# Patient Record
Sex: Male | Born: 1945
Health system: Southern US, Community
[De-identification: ages and names within clinical notes are randomized; demographics above are authoritative.]

## PROBLEM LIST (undated history)

## (undated) DIAGNOSIS — Z87440 Personal history of urinary (tract) infections: Secondary | ICD-10-CM

## (undated) DIAGNOSIS — S72002A Fracture of unspecified part of neck of left femur, initial encounter for closed fracture: Secondary | ICD-10-CM

## (undated) DIAGNOSIS — Z8774 Personal history of (corrected) congenital malformations of heart and circulatory system: Secondary | ICD-10-CM

## (undated) DIAGNOSIS — R918 Other nonspecific abnormal finding of lung field: Secondary | ICD-10-CM

## (undated) DIAGNOSIS — I5022 Chronic systolic (congestive) heart failure: Secondary | ICD-10-CM

## (undated) DIAGNOSIS — J189 Pneumonia, unspecified organism: Secondary | ICD-10-CM

## (undated) DIAGNOSIS — E042 Nontoxic multinodular goiter: Secondary | ICD-10-CM

## (undated) DIAGNOSIS — I82409 Acute embolism and thrombosis of unspecified deep veins of unspecified lower extremity: Secondary | ICD-10-CM

## (undated) DIAGNOSIS — N183 Chronic kidney disease, stage 3 (moderate): Secondary | ICD-10-CM

## (undated) DIAGNOSIS — E785 Hyperlipidemia, unspecified: Secondary | ICD-10-CM

## (undated) DIAGNOSIS — N139 Obstructive and reflux uropathy, unspecified: Secondary | ICD-10-CM

## (undated) DIAGNOSIS — R0600 Dyspnea, unspecified: Secondary | ICD-10-CM

## (undated) DIAGNOSIS — Z9289 Personal history of other medical treatment: Secondary | ICD-10-CM

## (undated) DIAGNOSIS — C61 Malignant neoplasm of prostate: Secondary | ICD-10-CM

## (undated) DIAGNOSIS — Z862 Personal history of diseases of the blood and blood-forming organs and certain disorders involving the immune mechanism: Secondary | ICD-10-CM

## (undated) DIAGNOSIS — R0609 Other forms of dyspnea: Secondary | ICD-10-CM

## (undated) DIAGNOSIS — N179 Acute kidney failure, unspecified: Secondary | ICD-10-CM

## (undated) DIAGNOSIS — I10 Essential (primary) hypertension: Secondary | ICD-10-CM

## (undated) DIAGNOSIS — I2119 ST elevation (STEMI) myocardial infarction involving other coronary artery of inferior wall: Secondary | ICD-10-CM

## (undated) DIAGNOSIS — Z87448 Personal history of other diseases of urinary system: Secondary | ICD-10-CM

## (undated) DIAGNOSIS — S68119A Complete traumatic metacarpophalangeal amputation of unspecified finger, initial encounter: Secondary | ICD-10-CM

## (undated) DIAGNOSIS — I700 Atherosclerosis of aorta: Secondary | ICD-10-CM

## (undated) DIAGNOSIS — I2510 Atherosclerotic heart disease of native coronary artery without angina pectoris: Secondary | ICD-10-CM

## (undated) DIAGNOSIS — N281 Cyst of kidney, acquired: Secondary | ICD-10-CM

## (undated) DIAGNOSIS — Z8679 Personal history of other diseases of the circulatory system: Secondary | ICD-10-CM

## (undated) DIAGNOSIS — Z9221 Personal history of antineoplastic chemotherapy: Secondary | ICD-10-CM

## (undated) DIAGNOSIS — I509 Heart failure, unspecified: Secondary | ICD-10-CM

## (undated) DIAGNOSIS — Q210 Ventricular septal defect: Secondary | ICD-10-CM

## (undated) DIAGNOSIS — I255 Ischemic cardiomyopathy: Secondary | ICD-10-CM

## (undated) DIAGNOSIS — C259 Malignant neoplasm of pancreas, unspecified: Secondary | ICD-10-CM

## (undated) DIAGNOSIS — I252 Old myocardial infarction: Secondary | ICD-10-CM

## (undated) DIAGNOSIS — Z8639 Personal history of other endocrine, nutritional and metabolic disease: Secondary | ICD-10-CM

## (undated) HISTORY — PX: OTHER SURGICAL HISTORY: SHX169

## (undated) HISTORY — PX: UPPER GI ENDOSCOPY: SHX6162

## (undated) HISTORY — PX: CARDIAC CATHETERIZATION: SHX172

## (undated) HISTORY — DX: Essential (primary) hypertension: I10

## (undated) HISTORY — DX: Atherosclerotic heart disease of native coronary artery without angina pectoris: I25.10

## (undated) HISTORY — DX: ST elevation (STEMI) myocardial infarction involving other coronary artery of inferior wall: I21.19

## (undated) HISTORY — PX: CORONARY ARTERY BYPASS GRAFT: SHX141

---

## 1964-01-14 DIAGNOSIS — I2119 ST elevation (STEMI) myocardial infarction involving other coronary artery of inferior wall: Secondary | ICD-10-CM

## 1964-01-14 HISTORY — DX: ST elevation (STEMI) myocardial infarction involving other coronary artery of inferior wall: I21.19

## 1997-07-29 ENCOUNTER — Emergency Department (HOSPITAL_COMMUNITY): Admission: EM | Admit: 1997-07-29 | Discharge: 1997-07-29 | Payer: Self-pay

## 1998-09-24 ENCOUNTER — Encounter: Payer: Self-pay | Admitting: Internal Medicine

## 1998-09-24 ENCOUNTER — Emergency Department (HOSPITAL_COMMUNITY): Admission: EM | Admit: 1998-09-24 | Discharge: 1998-09-24 | Payer: Self-pay | Admitting: Internal Medicine

## 1998-09-27 ENCOUNTER — Emergency Department (HOSPITAL_COMMUNITY): Admission: EM | Admit: 1998-09-27 | Discharge: 1998-09-28 | Payer: Self-pay | Admitting: Emergency Medicine

## 1998-09-27 ENCOUNTER — Encounter: Payer: Self-pay | Admitting: Emergency Medicine

## 1998-10-19 ENCOUNTER — Emergency Department (HOSPITAL_COMMUNITY): Admission: EM | Admit: 1998-10-19 | Discharge: 1998-10-20 | Payer: Self-pay | Admitting: Emergency Medicine

## 1998-10-19 ENCOUNTER — Encounter: Payer: Self-pay | Admitting: Emergency Medicine

## 1999-02-13 ENCOUNTER — Emergency Department (HOSPITAL_COMMUNITY): Admission: EM | Admit: 1999-02-13 | Discharge: 1999-02-13 | Payer: Self-pay | Admitting: Emergency Medicine

## 1999-07-08 ENCOUNTER — Emergency Department (HOSPITAL_COMMUNITY): Admission: EM | Admit: 1999-07-08 | Discharge: 1999-07-08 | Payer: Self-pay | Admitting: Emergency Medicine

## 1999-07-08 ENCOUNTER — Encounter: Payer: Self-pay | Admitting: Emergency Medicine

## 2000-02-25 ENCOUNTER — Emergency Department (HOSPITAL_COMMUNITY): Admission: EM | Admit: 2000-02-25 | Discharge: 2000-02-25 | Payer: Self-pay | Admitting: Internal Medicine

## 2000-08-16 ENCOUNTER — Emergency Department (HOSPITAL_COMMUNITY): Admission: EM | Admit: 2000-08-16 | Discharge: 2000-08-16 | Payer: Self-pay

## 2000-08-18 ENCOUNTER — Emergency Department (HOSPITAL_COMMUNITY): Admission: EM | Admit: 2000-08-18 | Discharge: 2000-08-18 | Payer: Self-pay | Admitting: Emergency Medicine

## 2000-08-18 ENCOUNTER — Encounter: Payer: Self-pay | Admitting: Emergency Medicine

## 2001-05-29 ENCOUNTER — Emergency Department (HOSPITAL_COMMUNITY): Admission: EM | Admit: 2001-05-29 | Discharge: 2001-05-29 | Payer: Self-pay

## 2001-10-08 ENCOUNTER — Emergency Department (HOSPITAL_COMMUNITY): Admission: EM | Admit: 2001-10-08 | Discharge: 2001-10-08 | Payer: Self-pay | Admitting: Emergency Medicine

## 2004-01-09 ENCOUNTER — Ambulatory Visit: Payer: Self-pay | Admitting: Nurse Practitioner

## 2004-06-02 ENCOUNTER — Emergency Department (HOSPITAL_COMMUNITY): Admission: EM | Admit: 2004-06-02 | Discharge: 2004-06-02 | Payer: Self-pay | Admitting: Emergency Medicine

## 2004-06-04 ENCOUNTER — Ambulatory Visit: Payer: Self-pay | Admitting: Nurse Practitioner

## 2004-06-11 ENCOUNTER — Ambulatory Visit: Payer: Self-pay | Admitting: Nurse Practitioner

## 2005-02-03 ENCOUNTER — Ambulatory Visit: Payer: Self-pay | Admitting: Internal Medicine

## 2005-12-02 ENCOUNTER — Ambulatory Visit: Payer: Self-pay | Admitting: Nurse Practitioner

## 2006-07-28 ENCOUNTER — Ambulatory Visit: Payer: Self-pay | Admitting: Family Medicine

## 2006-08-31 ENCOUNTER — Ambulatory Visit: Payer: Self-pay | Admitting: Internal Medicine

## 2006-11-23 ENCOUNTER — Ambulatory Visit: Payer: Self-pay | Admitting: Internal Medicine

## 2006-11-23 ENCOUNTER — Encounter (INDEPENDENT_AMBULATORY_CARE_PROVIDER_SITE_OTHER): Payer: Self-pay | Admitting: Nurse Practitioner

## 2006-11-23 LAB — CONVERTED CEMR LAB
ALT: <8 units/L (ref 0–53)
AST: 11 units/L (ref 0–37)
Basophils Absolute: 0 10*3/uL (ref 0.0–0.1)
Basophils Relative: 1 % (ref 0–1)
Chloride: 104 meq/L (ref 96–112)
Creatinine, Ser: 1.03 mg/dL (ref 0.40–1.50)
Eosinophils Relative: 1 % (ref 0–5)
Hemoglobin: 12.3 g/dL — ABNORMAL LOW (ref 13.0–17.0)
MCHC: 31.7 g/dL (ref 30.0–36.0)
Monocytes Absolute: 0.3 10*3/uL (ref 0.1–1.0)
Neutro Abs: 2.9 10*3/uL (ref 1.7–7.7)
PSA: 0.72 ng/mL (ref 0.10–4.00)
Platelets: 418 10*3/uL — ABNORMAL HIGH (ref 150–400)
RDW: 16.2 % — ABNORMAL HIGH (ref 11.5–15.5)
Sodium: 140 meq/L (ref 135–145)
TSH: 0.953 microintl units/mL (ref 0.350–5.50)
Total Bilirubin: 0.2 mg/dL — ABNORMAL LOW (ref 0.3–1.2)
Total CHOL/HDL Ratio: 6
VLDL: 25 mg/dL (ref 0–40)

## 2006-12-06 IMAGING — CR DG CHEST 2V
2 series · 2 of 2 positions shown · non-contrast
Comparison: None.

CLINICAL DATA: Productive cough.  High blood pressure.  Smoker.
CHEST - 2 VIEWS:

[w chest pa]
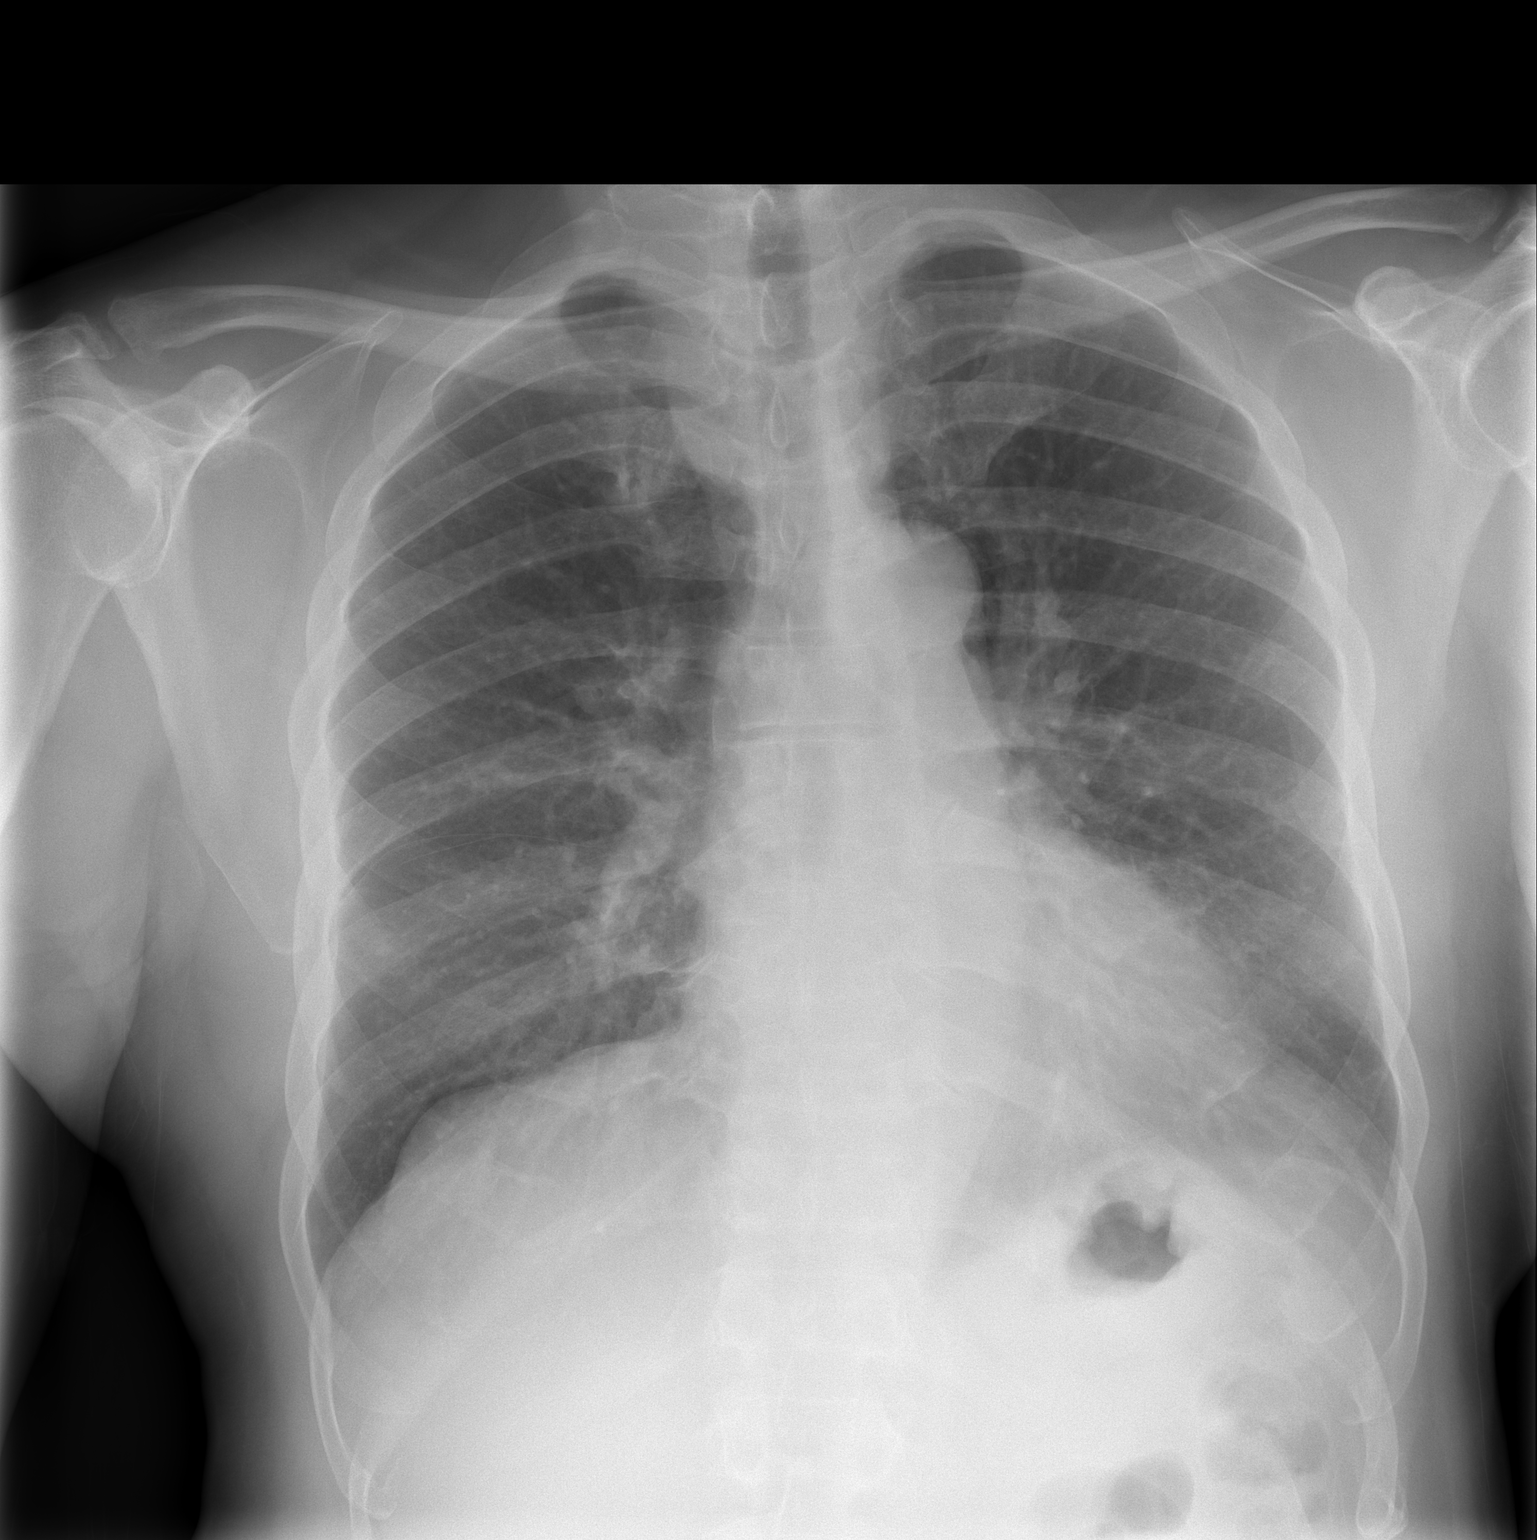

[w chest lat]
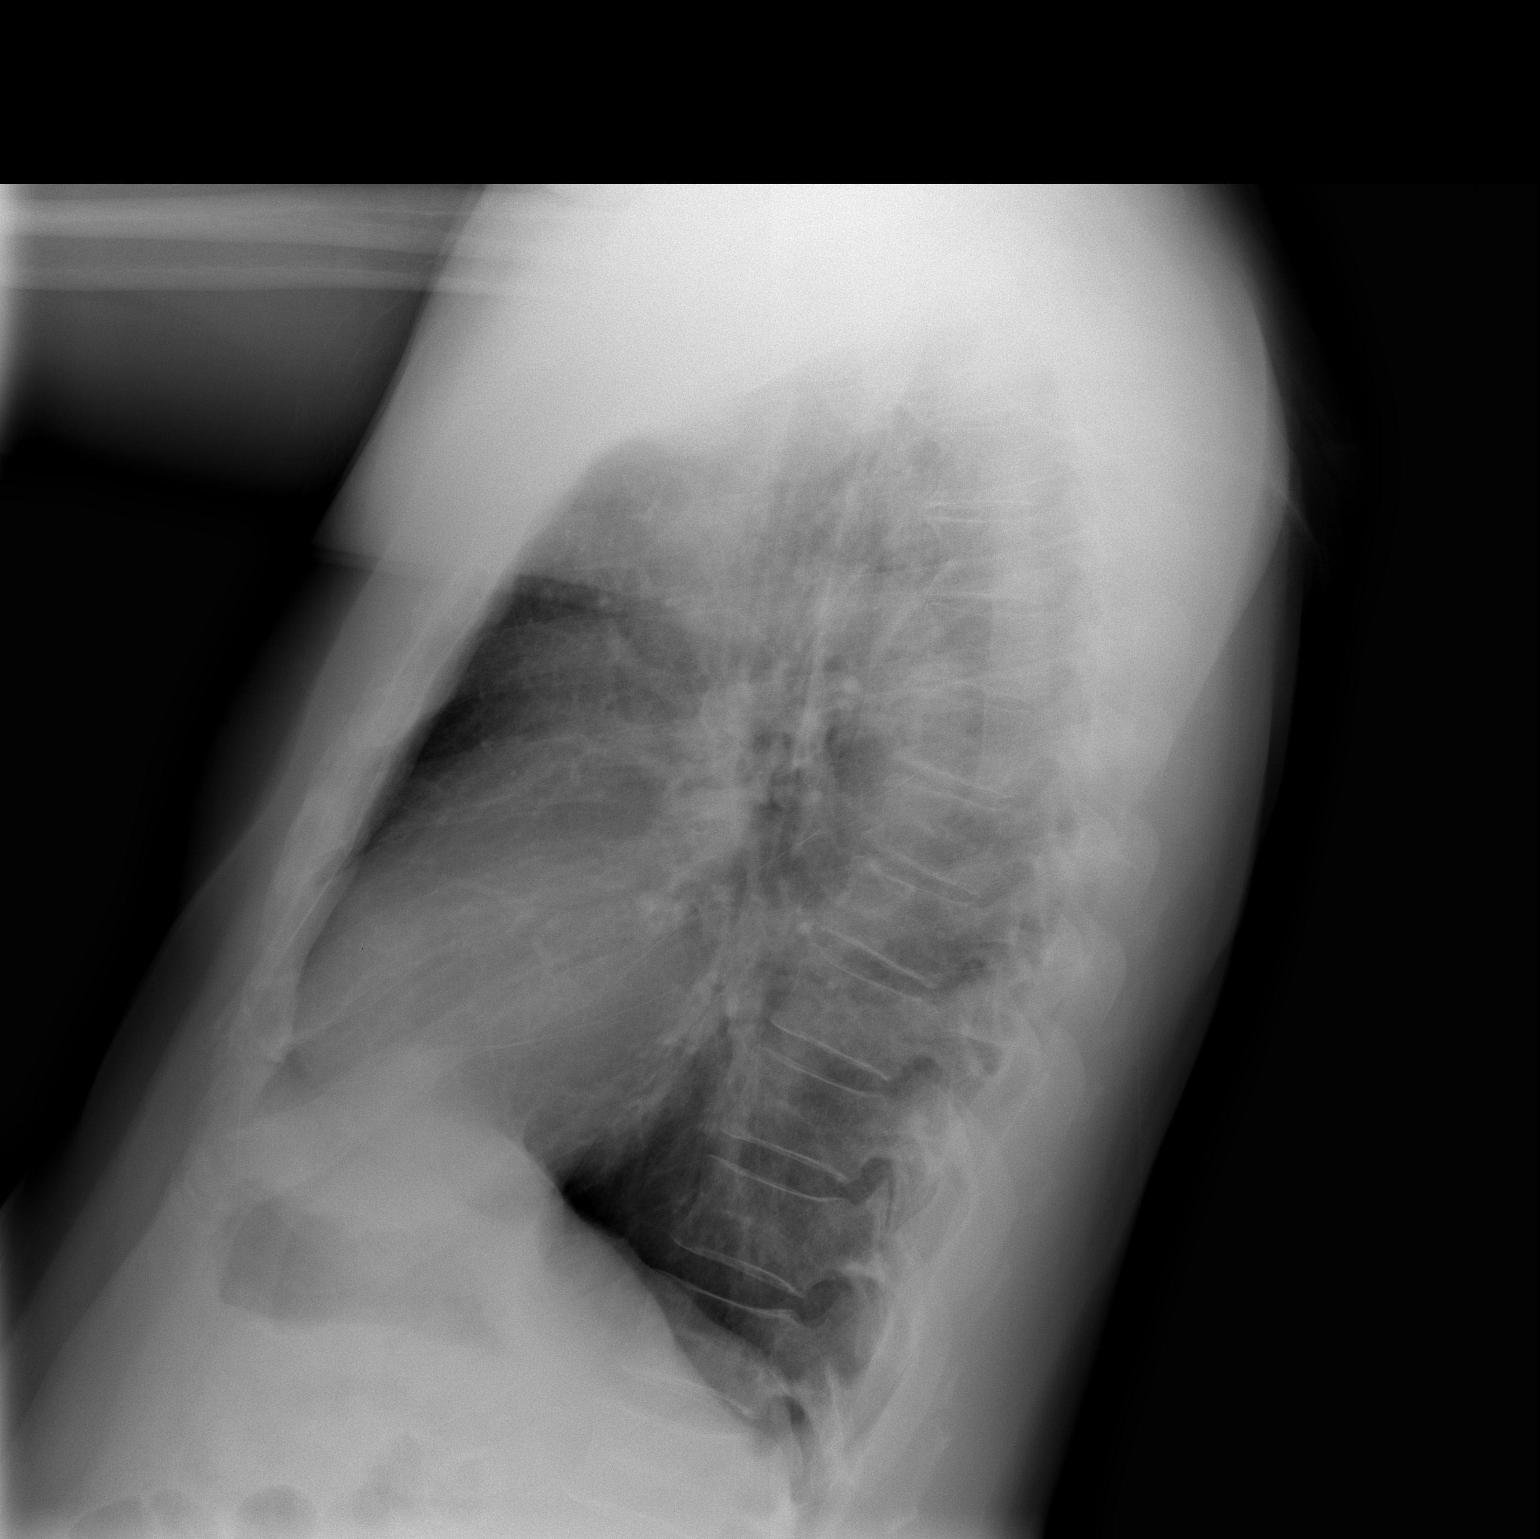

[2 of 2 positions shown; findings below may reference images not displayed]

FINDINGS: A 5 mm nodule within the right midlung is identified.  Golzad the patient?s history of smoking, I would recommend a three month follow-up.  
The remaining lungs are clear.  The heart size is normal.  No pleural effusions or pneumothorax.
IMPRESSION: 1.  No acute cardiopulmonary abnormality.
2.  5 mm nodule with the right midlung.  Recommend three month follow-up.

## 2007-11-23 ENCOUNTER — Emergency Department (HOSPITAL_COMMUNITY): Admission: EM | Admit: 2007-11-23 | Discharge: 2007-11-23 | Payer: Self-pay | Admitting: Family Medicine

## 2008-01-20 ENCOUNTER — Ambulatory Visit: Payer: Self-pay | Admitting: Internal Medicine

## 2008-01-21 ENCOUNTER — Encounter (INDEPENDENT_AMBULATORY_CARE_PROVIDER_SITE_OTHER): Payer: Self-pay | Admitting: Internal Medicine

## 2008-01-21 LAB — CONVERTED CEMR LAB
ALT: <8 units/L (ref 0–53)
AST: 10 units/L (ref 0–37)
Albumin: 3.4 g/dL — ABNORMAL LOW (ref 3.5–5.2)
Basophils Absolute: 0 10*3/uL (ref 0.0–0.1)
Basophils Relative: 1 % (ref 0–1)
Calcium: 8.8 mg/dL (ref 8.4–10.5)
Chloride: 106 meq/L (ref 96–112)
Creatinine, Ser: 1 mg/dL (ref 0.40–1.50)
MCHC: 32.2 g/dL (ref 30.0–36.0)
Neutro Abs: 2.5 10*3/uL (ref 1.7–7.7)
Neutrophils Relative %: 43 % (ref 43–77)
PSA: 0.73 ng/mL (ref 0.10–4.00)
Platelets: 371 10*3/uL (ref 150–400)
Potassium: 3.5 meq/L (ref 3.5–5.3)
RDW: 16.4 % — ABNORMAL HIGH (ref 11.5–15.5)
Total CHOL/HDL Ratio: 5.9

## 2008-08-22 ENCOUNTER — Ambulatory Visit: Payer: Self-pay | Admitting: Family Medicine

## 2008-08-22 ENCOUNTER — Encounter (INDEPENDENT_AMBULATORY_CARE_PROVIDER_SITE_OTHER): Payer: Self-pay | Admitting: Internal Medicine

## 2008-08-22 LAB — CONVERTED CEMR LAB: Microalb, Ur: 28.93 mg/dL — ABNORMAL HIGH (ref 0.00–1.89)

## 2009-09-13 DIAGNOSIS — Q210 Ventricular septal defect: Secondary | ICD-10-CM

## 2009-09-13 HISTORY — DX: Ventricular septal defect: Q21.0

## 2009-09-13 HISTORY — PX: OTHER SURGICAL HISTORY: SHX169

## 2009-10-08 ENCOUNTER — Inpatient Hospital Stay (HOSPITAL_COMMUNITY)
Admission: EM | Admit: 2009-10-08 | Discharge: 2009-10-16 | Payer: Self-pay | Source: Home / Self Care | Admitting: Cardiology

## 2009-10-08 ENCOUNTER — Encounter: Payer: Self-pay | Admitting: Emergency Medicine

## 2009-10-08 ENCOUNTER — Ambulatory Visit: Payer: Self-pay | Admitting: Cardiology

## 2009-10-08 ENCOUNTER — Ambulatory Visit: Payer: Self-pay | Admitting: Cardiothoracic Surgery

## 2009-10-08 DIAGNOSIS — I252 Old myocardial infarction: Secondary | ICD-10-CM

## 2009-10-08 HISTORY — DX: Old myocardial infarction: I25.2

## 2009-10-09 ENCOUNTER — Encounter: Payer: Self-pay | Admitting: Cardiology

## 2009-10-17 ENCOUNTER — Encounter: Payer: Self-pay | Admitting: Cardiology

## 2009-10-18 ENCOUNTER — Encounter: Payer: Self-pay | Admitting: Internal Medicine

## 2009-11-05 ENCOUNTER — Encounter: Admission: RE | Admit: 2009-11-05 | Discharge: 2009-11-05 | Payer: Self-pay | Admitting: Cardiothoracic Surgery

## 2009-11-05 ENCOUNTER — Ambulatory Visit: Payer: Self-pay | Admitting: Cardiothoracic Surgery

## 2009-11-07 ENCOUNTER — Ambulatory Visit: Payer: Self-pay | Admitting: Internal Medicine

## 2009-11-07 ENCOUNTER — Encounter: Payer: Self-pay | Admitting: Internal Medicine

## 2009-11-07 ENCOUNTER — Encounter: Payer: Self-pay | Admitting: Physician Assistant

## 2009-11-07 DIAGNOSIS — I10 Essential (primary) hypertension: Secondary | ICD-10-CM

## 2009-11-07 DIAGNOSIS — Q210 Ventricular septal defect: Secondary | ICD-10-CM | POA: Insufficient documentation

## 2009-11-07 DIAGNOSIS — R011 Cardiac murmur, unspecified: Secondary | ICD-10-CM

## 2009-11-07 DIAGNOSIS — I25810 Atherosclerosis of coronary artery bypass graft(s) without angina pectoris: Secondary | ICD-10-CM

## 2009-11-09 ENCOUNTER — Encounter: Payer: Self-pay | Admitting: Internal Medicine

## 2009-11-14 ENCOUNTER — Ambulatory Visit: Payer: Self-pay | Admitting: Cardiothoracic Surgery

## 2009-11-14 ENCOUNTER — Encounter: Payer: Self-pay | Admitting: Cardiology

## 2009-11-14 ENCOUNTER — Encounter: Admission: RE | Admit: 2009-11-14 | Discharge: 2009-11-14 | Payer: Self-pay | Admitting: Cardiothoracic Surgery

## 2009-11-16 ENCOUNTER — Encounter: Payer: Self-pay | Admitting: Internal Medicine

## 2009-11-23 ENCOUNTER — Encounter: Payer: Self-pay | Admitting: Internal Medicine

## 2009-12-05 ENCOUNTER — Ambulatory Visit: Payer: Self-pay | Admitting: Cardiothoracic Surgery

## 2009-12-05 ENCOUNTER — Encounter: Admission: RE | Admit: 2009-12-05 | Discharge: 2009-12-05 | Payer: Self-pay | Admitting: Cardiothoracic Surgery

## 2009-12-05 ENCOUNTER — Encounter: Payer: Self-pay | Admitting: Cardiology

## 2009-12-11 ENCOUNTER — Ambulatory Visit: Payer: Self-pay | Admitting: Cardiology

## 2009-12-11 ENCOUNTER — Ambulatory Visit (HOSPITAL_COMMUNITY): Admission: RE | Admit: 2009-12-11 | Discharge: 2009-12-11 | Payer: Self-pay | Admitting: Internal Medicine

## 2009-12-11 ENCOUNTER — Ambulatory Visit: Payer: Self-pay | Admitting: Internal Medicine

## 2009-12-11 ENCOUNTER — Encounter: Payer: Self-pay | Admitting: Internal Medicine

## 2009-12-11 ENCOUNTER — Ambulatory Visit: Payer: Self-pay

## 2009-12-11 DIAGNOSIS — I5022 Chronic systolic (congestive) heart failure: Secondary | ICD-10-CM

## 2009-12-20 LAB — CONVERTED CEMR LAB
Basophils Relative: 0.3 % (ref 0.0–3.0)
Calcium: 9.4 mg/dL (ref 8.4–10.5)
Chloride: 105 meq/L (ref 96–112)
Creatinine, Ser: 1.2 mg/dL (ref 0.4–1.5)
Eosinophils Relative: 2.7 % (ref 0.0–5.0)
GFR calc non Af Amer: 76.99 mL/min (ref >60)
Lymphocytes Relative: 34.8 % (ref 12.0–46.0)
MCV: 83.7 fL (ref 78.0–100.0)
Monocytes Relative: 8.3 % (ref 3.0–12.0)
Neutrophils Relative %: 53.9 % (ref 43.0–77.0)
RBC: 3.81 M/uL — ABNORMAL LOW (ref 4.22–5.81)
WBC: 8.3 10*3/uL (ref 4.5–10.5)

## 2009-12-26 ENCOUNTER — Emergency Department (HOSPITAL_COMMUNITY)
Admission: EM | Admit: 2009-12-26 | Discharge: 2009-12-26 | Payer: Self-pay | Source: Home / Self Care | Admitting: Emergency Medicine

## 2010-02-12 ENCOUNTER — Telehealth: Payer: Self-pay | Admitting: Internal Medicine

## 2010-02-12 NOTE — Assessment & Plan Note (Signed)
Summary: eph/ gd   Visit Type:  Follow-up  CC:  no complaints.  History of Present Illness: This is a 65 year old African American male patient who had emergency CABG x4 with LIMA to the LAD, SVG to the diagonal, SVG to the circumflex marginal, and SVG to the PDA as well as repair of posterior infarction and posterior ventricular septal defect in September 2011. He also had urethral stricture with with difficulty inserting the Foley catheter at time of surgery.  The patient has been staying at [facility] since surgery but is looking for a place to go once he is discharged. He does have some edema but denies any dyspnea, dyspnea on exertion, palpitations, chest pain, dizziness, or presyncope.  The patient saw the PA at the cardiovascular surgical office on Monday and was placed on Lasix because of a left pleural effusion and edema.  Current Medications (verified): 1)  Amlodipine Besylate 10 Mg Tabs (Amlodipine Besylate) .... Take One Daily 2)  Bufferin 325 Mg Tabs (Aspirin Buf(Cacarb-Mgcarb-Mgo)) .... Take One Daily 3)  Coreg 12.5 Mg Tabs (Carvedilol) .... Take One Two Times A Day 4)  Plavix 75 Mg Tabs (Clopidogrel Bisulfate) .... Take One Daily 5)  Crestor 40 Mg Tabs (Rosuvastatin Calcium) .... Take One Daily 6)  Ultram 50 Mg Tabs (Tramadol Hcl) .... Take As Needed 7)  Furosemide 40 Mg Tabs (Furosemide) .... One Daily For 7 Days 8)  Klor-Con M20 20 Meq Cr-Tabs (Potassium Chloride Crys Cr) .... One Daily For 7 Days  Past History:  Past Medical History: Last updated: 11/06/2009 Acute inferior wall myocardial infarction Severe 3-vessel coronary artery disease hypertension History of tobacco abuse Urethral stricture disease  Social History: Reviewed history from 11/06/2009 and no changes required. The patient lives in [place] alone.   He is not working.   He has a 50+ pack-year smoking history quitting 2 weeks ago No EtOH or illicit drug use.  No  herbal meds Regular diet No regular exercise  Review of Systems       see history of present illness  Vital Signs:  Patient profile:   65 year old male Height:      66 inches Weight:      177 pounds BMI:     28.67 Pulse rate:   68 / minute Pulse rhythm:   regular BP sitting:   124 / 79  (right arm)  Vitals Entered By: Talbert Nan, CMA (November 07, 2009 11:10 AM)  Physical Exam  General:   Well-nournished, in no acute distress. Neck:slight increased JVD, No , HJR, Bruit, or thyroid enlargement Lungs: decreased breath sounds on the left lung base and half the way up,No tachypnea,right lung clear without wheezing, rales, or rhonchi Cardiovascular: RRR, PMI not displaced, heart sounds normal,a loud [id number] systolic murmur at the apex, no gallops, bruit, thrill, or heave. Abdomen: BS normal. Soft without organomegaly, masses, lesions or tenderness. Extremities:+2-3 bilateral leg edema his knees, without cyanosis, clubbing . Good distal pulses bilateral SKin: Warm, no lesions or rashes  Musculoskeletal: No deformities Neuro: no focal signs    EKG  Procedure date:  11/07/2009  Findings:      normal sinus rhythm inferior Q waves T wave inversion inferolaterally poor R-wave progression  Impression & Recommendations:  Problem # 1:  CAD, AUTOLOGOUS BYPASS GRAFT (ICD-414.02)  His updated medication list for this problem includes:    Amlodipine Besylate 10 Mg Tabs (Amlodipine besylate) .Marland Kitchen... Take one daily    Bufferin 325 Mg Tabs (  Aspirin buf(cacarb-mgcarb-mgo)) .Marland Kitchen... Take one daily    Coreg 12.5 Mg Tabs (Carvedilol) .Marland Kitchen... Take one two times a day    Plavix 75 Mg Tabs (Clopidogrel bisulfate) .Marland Kitchen... Take one daily  His updated medication list for this problem includes:    Amlodipine Besylate 10 Mg Tabs (Amlodipine besylate) .Marland Kitchen... Take one daily    Bufferin 325 Mg Tabs (Aspirin buf(cacarb-mgcarb-mgo)) .Marland Kitchen... Take one daily    Coreg 12.5 Mg Tabs (Carvedilol) .Marland Kitchen... Take one two  times a day    Plavix 75 Mg Tabs (Clopidogrel bisulfate) .Marland Kitchen... Take one daily  Problem # 2:  VENTRICULAR SEPTAL DEFECT (ICD-745.4)  Orders: Echocardiogram (Echo)  Problem # 3:  PLEURAL EFFUSION, LEFT (ICD-511.9) Patient has a left pleural effusion and lower extremity edema. He was given a 7 day treatment of diuretics. He is to follow up with the surgeons for this.  Problem # 4:  RENAL INSUFFICIENCY (ICD-588.9) Patient had some renal insufficiency in the hospital and therefore was started on an ACE inhibitor. He has had normal BUN and creatinine since he's been home. I will now start an ACE inhibitor now that he is on a diuretic. We will follow him up and reevaluate the need for an ACE inhibitor.  Patient Instructions: 1)  Your physician has requested that you have an echocardiogram.  Echocardiography is a painless test that uses sound waves to create images of your heart. It provides your doctor with information about the size and shape of your heart and how well your heart's chambers and valves are working.  This procedure takes approximately one hour. There are no restrictions for this procedure. 2)  Your physician recommends that you schedule a follow-up appointment in: 1 month with Dr Haroldine Laws.

## 2010-02-12 NOTE — Letter (Signed)
Summary: Cardiac Rehab Phase 2   Cardiac Rehab Phase 2   Imported By: Marilynne Drivers 11/01/2009 13:13:29  _____________________________________________________________________  External Attachment:    Type:   Image     Comment:   External Document

## 2010-02-12 NOTE — Assessment & Plan Note (Signed)
Summary: 1 month rov/sl   Visit Type:  Follow-up Primary Provider:  Shary Key, MD   History of Present Illness: [name] is a 65 year old male who experienced an acute inferior MI in September 2011 due to occlusion of a PL branch. Cath with severe 3-V CAD with TIMI-3 flow in the PL branch. Post MI course c/b acute VSD ao taken emergency CABG x4 with LIMA to the LAD, SVG to the diagonal, SVG to the circumflex marginal, and SVG to the PDA as well as repair of entricular septal defect in September 2011.  Discharged to [facility] rehab facility. Post-op had persistent L pleural effusion which resolved with Lasix. Saw Dr. Prescott Gum last week and was doing well.   Doing very well. Working with rehab and ambulating. No CP or SOB. Edema resolved.   Echo today which I reviewed personally showed  EF  ~35-40% range with akinesis of mid to distal inferior wall, apex and septum. VSD patch stable. Mild MR.    Current Medications (verified): 1)  Amlodipine Besylate 10 Mg Tabs (Amlodipine Besylate) .... Take One Daily 2)  Bufferin 325 Mg Tabs (Aspirin Buf(Cacarb-Mgcarb-Mgo)) .... Take One Daily 3)  Coreg 12.5 Mg Tabs (Carvedilol) .... Take One Two Times A Day 4)  Plavix 75 Mg Tabs (Clopidogrel Bisulfate) .... Take One Daily 5)  Crestor 40 Mg Tabs (Rosuvastatin Calcium) .... Take One Daily 6)  Ultram 50 Mg Tabs (Tramadol Hcl) .... Take As Needed  Allergies (verified): No Known Drug Allergies  Past History:  Past Medical History: Last updated: 11/06/2009 Acute inferior wall myocardial infarction Severe 3-vessel coronary artery disease hypertension History of tobacco abuse Urethral stricture disease  Review of Systems       As per HPI and past medical history; otherwise all systems negative.   Vital Signs:  Patient profile:   65 year old male Height:      66 inches Weight:      180 pounds BMI:     29.16 Pulse rate:   72 / minute BP sitting:   110 / 88  (left arm)  Vitals Entered By:  Margaretmary Bayley CMA (December 11, 2009 11:20 AM)  Physical Exam  General:  Sitting in [place]-C.  Well-nournished, in no acute distress. Neck: supple no JVD. carotids 2+ bilaterall Lungs: clear without wheezing, rales, or rhonchi Cardiovascular: RRR, PMI not displaced, heart sounds normal 2/6 systolic murmur at the apex, no gallops, bruit, thrill, or heave. Abdomen: BS normal. Soft without organomegaly, masses, lesions or tenderness. Extremities:tr edema without cyanosis, clubbing . Good distal pulses bilateral SKin: Warm, no lesions or rashes  Musculoskeletal: No deformities Neuro: no focal signs    Impression & Recommendations:  Problem # 1:  CAD, AUTOLOGOUS BYPASS GRAFT (ICD-414.02) Stable. Doing well post-op.    Problem # 2:  SYSTOLIC HEART FAILURE, CHRONIC (ICD-428.22) Functional status much improved. Volume overload resolved on low-dose lasix. Will check labs today. Continue b-blocker. Start lisinopril 5mg  daily and titrate as tolerated.   Other Orders: TLB-BMP (Basic Metabolic Panel-BMET) (99991111) TLB-CBC Platelet - w/Differential (85025-CBCD) TLB-BNP (B-Natriuretic Peptide) (83880-BNPR)   Patient Instructions: 1)  Your physician recommends that you schedule a follow-up appointment in: 6 months with Dr. Haroldine Laws 2)  Your physician recommends that you have  lab work today:bmet,bnp,cbc 3)  Your physician has recommended you make the following change in your medication:  Prescriptions: LISINOPRIL 5 MG TABS (LISINOPRIL) Take one tablet by mouth daily  #30 x 6   Entered by:   Joelyn Oms RN  Authorized by:   Jolaine Artist, MD, [facility]   Signed by:   Joelyn Oms RN on 12/11/2009   Method used:   Print then Give to Patient   RxID:   [id number]

## 2010-02-12 NOTE — Consult Note (Signed)
Summary: [facility] By: Sallee Provencal 11/19/2009 09:16:33  _____________________________________________________________________  External Attachment:    Type:   Image     Comment:   External Document

## 2010-02-12 NOTE — Letter (Signed)
Summary: [facility]   Imported By: Marilynne Drivers 12/18/2009 15:10:40  _____________________________________________________________________  External Attachment:    Type:   Image     Comment:   External Document

## 2010-02-14 NOTE — Letter (Signed)
Summary: [facility] Office Visit Note   [facility] Visit Note   Imported By: Sallee Provencal 01/11/2010 11:51:03  _____________________________________________________________________  External Attachment:    Type:   Image     Comment:   External Document

## 2010-02-14 NOTE — Letter (Signed)
Summary: [facility] - Office Note  [facility] - Office Note   Imported By: Marilynne Drivers 01/03/2010 16:39:24  _____________________________________________________________________  External Attachment:    Type:   Image     Comment:   External Document

## 2010-02-20 NOTE — Progress Notes (Signed)
Summary: refill  Phone Note Refill Request Message from:  Patient on February 12, 2010 2:34 PM  Refills Requested: Medication #1:  PLAVIX 75 MG TABS take one daily  Medication #2:  CRESTOR 40 MG TABS take one daily  Medication #3:  LISINOPRIL 5 MG TABS Take one tablet by mouth daily.  Medication #4:  AMLODIPINE BESYLATE 10 MG TABS take one daily send to [facility] [phone]  Initial call taken by: Delsa Sale,  February 12, 2010 2:35 PM    Prescriptions: LISINOPRIL 5 MG TABS (LISINOPRIL) Take one tablet by mouth daily  #30 x 6   Entered by:   Mignon Pine, RMA   Authorized by:   Jolaine Artist, MD, [facility]   Signed by:   Mignon Pine, RMA on 02/12/2010   Method used:   Electronically to        [facility] (retail)       [place], [place]  [address]       Ph: [phone]       Fax: [phone]   RxID:   [id number] CRESTOR 40 MG TABS (ROSUVASTATIN CALCIUM) take one daily  #30 x 6   Entered by:   Mignon Pine, RMA   Authorized by:   Jolaine Artist, MD, [facility]   Signed by:   Mignon Pine, RMA on 02/12/2010   Method used:   Electronically to        [facility] [phone]* (retail)       [place], [place]  [address]       Ph: [phone]       Fax: [phone]   RxID:   [id number] PLAVIX 75 MG TABS (CLOPIDOGREL BISULFATE) take one daily  #30 x 6   Entered by:   Mignon Pine, RMA   Authorized by:   Jolaine Artist, MD, [facility]   Signed by:   Mignon Pine, RMA on 02/12/2010   Method used:   Electronically to        [facility] (retail)       [place], [place]  [address]       Ph: [phone]       Fax: [phone]   RxID:   [id number] AMLODIPINE BESYLATE 10 MG TABS (AMLODIPINE BESYLATE) take one daily  #30 x 6   Entered by:   Mignon Pine, RMA   Authorized by:   Jolaine Artist, MD, [facility]   Signed by:   Mignon Pine, RMA on 02/12/2010  Method used:   Electronically to        [facility] (retail)       [address]       [place],   [address]       Ph: [phone]       Fax: [phone]   RxID:   [id number]

## 2010-03-25 LAB — DIFFERENTIAL
Eosinophils Absolute: 0.3 10*3/uL (ref 0.0–0.7)
Eosinophils Relative: 4 % (ref 0–5)
Lymphocytes Relative: 37 % (ref 12–46)
Lymphs Abs: 3 10*3/uL (ref 0.7–4.0)
Monocytes Relative: 8 % (ref 3–12)

## 2010-03-25 LAB — BASIC METABOLIC PANEL
CO2: 26 mEq/L (ref 19–32)
Chloride: 109 mEq/L (ref 96–112)
GFR calc Af Amer: >60 mL/min (ref >60)
Potassium: 4.2 mEq/L (ref 3.5–5.1)
Sodium: 139 mEq/L (ref 135–145)

## 2010-03-25 LAB — CBC
Hemoglobin: 10.3 g/dL — ABNORMAL LOW (ref 13.0–17.0)
MCH: 25.9 pg — ABNORMAL LOW (ref 26.0–34.0)
MCV: 82.4 fL (ref 78.0–100.0)
RBC: 3.98 MIL/uL — ABNORMAL LOW (ref 4.22–5.81)
WBC: 8.3 10*3/uL (ref 4.0–10.5)

## 2010-03-27 LAB — BASIC METABOLIC PANEL
BUN: 27 mg/dL — ABNORMAL HIGH (ref 6–23)
BUN: 28 mg/dL — ABNORMAL HIGH (ref 6–23)
CO2: 23 mEq/L (ref 19–32)
CO2: 28 mEq/L (ref 19–32)
Calcium: 8.4 mg/dL (ref 8.4–10.5)
Chloride: 102 mEq/L (ref 96–112)
Chloride: 97 mEq/L (ref 96–112)
Creatinine, Ser: 1.44 mg/dL (ref 0.4–1.5)
Creatinine, Ser: 1.5 mg/dL (ref 0.4–1.5)
GFR calc Af Amer: 59 mL/min — ABNORMAL LOW (ref >60)
GFR calc non Af Amer: 49 mL/min — ABNORMAL LOW (ref >60)
Glucose, Bld: 91 mg/dL (ref 70–99)
Glucose, Bld: 91 mg/dL (ref 70–99)
Potassium: 3.7 mEq/L (ref 3.5–5.1)
Potassium: 4.2 mEq/L (ref 3.5–5.1)
Sodium: 134 mEq/L — ABNORMAL LOW (ref 135–145)

## 2010-03-27 LAB — COMPREHENSIVE METABOLIC PANEL
ALT: 21 U/L (ref 0–53)
AST: 27 U/L (ref 0–37)
Albumin: 2.4 g/dL — ABNORMAL LOW (ref 3.5–5.2)
Alkaline Phosphatase: 114 U/L (ref 39–117)
BUN: 29 mg/dL — ABNORMAL HIGH (ref 6–23)
CO2: 27 mEq/L (ref 19–32)
Calcium: 8.3 mg/dL — ABNORMAL LOW (ref 8.4–10.5)
Chloride: 101 mEq/L (ref 96–112)
Creatinine, Ser: 1.68 mg/dL — ABNORMAL HIGH (ref 0.4–1.5)
GFR calc Af Amer: 50 mL/min — ABNORMAL LOW (ref >60)
GFR calc non Af Amer: 41 mL/min — ABNORMAL LOW (ref >60)
Glucose, Bld: 94 mg/dL (ref 70–99)
Potassium: 3.3 mEq/L — ABNORMAL LOW (ref 3.5–5.1)
Sodium: 135 mEq/L (ref 135–145)
Total Bilirubin: 0.8 mg/dL (ref 0.3–1.2)
Total Protein: 6 g/dL (ref 6.0–8.3)

## 2010-03-27 LAB — CBC
HCT: 27.8 % — ABNORMAL LOW (ref 39.0–52.0)
Hemoglobin: 9.3 g/dL — ABNORMAL LOW (ref 13.0–17.0)
MCH: 29 pg (ref 26.0–34.0)
MCHC: 33.5 g/dL (ref 30.0–36.0)
MCV: 86.6 fL (ref 78.0–100.0)
Platelets: 180 10*3/uL (ref 150–400)
RBC: 3.21 MIL/uL — ABNORMAL LOW (ref 4.22–5.81)
RDW: 16.4 % — ABNORMAL HIGH (ref 11.5–15.5)
WBC: 13.6 10*3/uL — ABNORMAL HIGH (ref 4.0–10.5)

## 2010-03-27 LAB — GLUCOSE, CAPILLARY

## 2010-03-28 LAB — URINE MICROSCOPIC-ADD ON

## 2010-03-28 LAB — BASIC METABOLIC PANEL
BUN: 29 mg/dL — ABNORMAL HIGH (ref 6–23)
BUN: 48 mg/dL — ABNORMAL HIGH (ref 6–23)
CO2: 23 mEq/L (ref 19–32)
CO2: 21 mEq/L (ref 19–32)
Calcium: 8.1 mg/dL — ABNORMAL LOW (ref 8.4–10.5)
Calcium: 8.8 mg/dL (ref 8.4–10.5)
Calcium: 9 mg/dL (ref 8.4–10.5)
Chloride: 111 mEq/L (ref 96–112)
Chloride: 111 mEq/L (ref 96–112)
Chloride: 104 mEq/L (ref 96–112)
Creatinine, Ser: 1.57 mg/dL — ABNORMAL HIGH (ref 0.4–1.5)
GFR calc Af Amer: 54 mL/min — ABNORMAL LOW (ref >60)
GFR calc Af Amer: 55 mL/min — ABNORMAL LOW (ref >60)
GFR calc Af Amer: 48 mL/min — ABNORMAL LOW (ref >60)
GFR calc non Af Amer: 35 mL/min — ABNORMAL LOW (ref >60)
GFR calc non Af Amer: 44 mL/min — ABNORMAL LOW (ref >60)
Glucose, Bld: 117 mg/dL — ABNORMAL HIGH (ref 70–99)
Glucose, Bld: 158 mg/dL — ABNORMAL HIGH (ref 70–99)
Potassium: 3.8 mEq/L (ref 3.5–5.1)
Potassium: 3.9 mEq/L (ref 3.5–5.1)
Potassium: 4 mEq/L (ref 3.5–5.1)
Sodium: 136 mEq/L (ref 135–145)
Sodium: 141 mEq/L (ref 135–145)
Sodium: 137 mEq/L (ref 135–145)

## 2010-03-28 LAB — CROSSMATCH
ABO/RH(D): O POS
Antibody Screen: NEGATIVE

## 2010-03-28 LAB — POCT I-STAT 4, (NA,K, GLUC, HGB,HCT)
Glucose, Bld: 101 mg/dL — ABNORMAL HIGH (ref 70–99)
Glucose, Bld: 130 mg/dL — ABNORMAL HIGH (ref 70–99)
Glucose, Bld: 139 mg/dL — ABNORMAL HIGH (ref 70–99)
Glucose, Bld: 90 mg/dL (ref 70–99)
Glucose, Bld: 91 mg/dL (ref 70–99)
Glucose, Bld: 96 mg/dL (ref 70–99)
HCT: 20 % — ABNORMAL LOW (ref 39.0–52.0)
HCT: 24 % — ABNORMAL LOW (ref 39.0–52.0)
HCT: 25 % — ABNORMAL LOW (ref 39.0–52.0)
HCT: 25 % — ABNORMAL LOW (ref 39.0–52.0)
HCT: 30 % — ABNORMAL LOW (ref 39.0–52.0)
Hemoglobin: 10.2 g/dL — ABNORMAL LOW (ref 13.0–17.0)
Hemoglobin: 6.8 g/dL — CL (ref 13.0–17.0)
Hemoglobin: 7.5 g/dL — ABNORMAL LOW (ref 13.0–17.0)
Hemoglobin: 8.2 g/dL — ABNORMAL LOW (ref 13.0–17.0)
Hemoglobin: 8.5 g/dL — ABNORMAL LOW (ref 13.0–17.0)
Hemoglobin: 8.8 g/dL — ABNORMAL LOW (ref 13.0–17.0)
Hemoglobin: 9.9 g/dL — ABNORMAL LOW (ref 13.0–17.0)
Potassium: 3.3 mEq/L — ABNORMAL LOW (ref 3.5–5.1)
Potassium: 3.5 mEq/L (ref 3.5–5.1)
Potassium: 3.9 mEq/L (ref 3.5–5.1)
Potassium: 4.9 mEq/L (ref 3.5–5.1)
Potassium: 5 mEq/L (ref 3.5–5.1)
Potassium: 5.9 mEq/L — ABNORMAL HIGH (ref 3.5–5.1)
Sodium: 137 mEq/L (ref 135–145)
Sodium: 139 mEq/L (ref 135–145)
Sodium: 144 mEq/L (ref 135–145)
Sodium: 145 mEq/L (ref 135–145)

## 2010-03-28 LAB — DIFFERENTIAL
Eosinophils Absolute: 0 10*3/uL (ref 0.0–0.7)
Lymphs Abs: 2 10*3/uL (ref 0.7–4.0)
Monocytes Relative: 12 % (ref 3–12)
Neutro Abs: 7 10*3/uL (ref 1.7–7.7)
Neutrophils Relative %: 68 % (ref 43–77)

## 2010-03-28 LAB — POCT I-STAT 3, ART BLOOD GAS (G3+)
Acid-Base Excess: 1 mmol/L (ref 0.0–2.0)
Acid-Base Excess: 1 mmol/L (ref 0.0–2.0)
Acid-base deficit: 2 mmol/L (ref 0.0–2.0)
Acid-base deficit: 3 mmol/L — ABNORMAL HIGH (ref 0.0–2.0)
Acid-base deficit: 3 mmol/L — ABNORMAL HIGH (ref 0.0–2.0)
Acid-base deficit: 3 mmol/L — ABNORMAL HIGH (ref 0.0–2.0)
Acid-base deficit: 4 mmol/L — ABNORMAL HIGH (ref 0.0–2.0)
Bicarbonate: 22.7 mEq/L (ref 20.0–24.0)
Bicarbonate: 23.6 mEq/L (ref 20.0–24.0)
Bicarbonate: 24.1 mEq/L — ABNORMAL HIGH (ref 20.0–24.0)
Bicarbonate: 24.4 mEq/L — ABNORMAL HIGH (ref 20.0–24.0)
Bicarbonate: 25.2 mEq/L — ABNORMAL HIGH (ref 20.0–24.0)
O2 Saturation: 87 %
O2 Saturation: 93 %
O2 Saturation: 98 %
O2 Saturation: 98 %
Patient temperature: 35.9
Patient temperature: 35.9
Patient temperature: 36.3
Patient temperature: 36.9
Patient temperature: 37.4
Patient temperature: 97.3
TCO2: 23 mmol/L (ref 0–100)
TCO2: 23 mmol/L (ref 0–100)
TCO2: 25 mmol/L (ref 0–100)
TCO2: 25 mmol/L (ref 0–100)
TCO2: 25 mmol/L (ref 0–100)
pCO2 arterial: 37.7 mmHg (ref 35.0–45.0)
pCO2 arterial: 40.7 mmHg (ref 35.0–45.0)
pCO2 arterial: 41.5 mmHg (ref 35.0–45.0)
pH, Arterial: 7.301 — ABNORMAL LOW (ref 7.350–7.450)
pH, Arterial: 7.301 — ABNORMAL LOW (ref 7.350–7.450)
pH, Arterial: 7.332 — ABNORMAL LOW (ref 7.350–7.450)
pH, Arterial: 7.341 — ABNORMAL LOW (ref 7.350–7.450)
pH, Arterial: 7.353 (ref 7.350–7.450)
pH, Arterial: 7.403 (ref 7.350–7.450)
pH, Arterial: 7.407 (ref 7.350–7.450)
pH, Arterial: 7.468 — ABNORMAL HIGH (ref 7.350–7.450)
pO2, Arterial: 352 mmHg — ABNORMAL HIGH (ref 80.0–100.0)
pO2, Arterial: 80 mmHg (ref 80.0–100.0)
pO2, Arterial: 93 mmHg (ref 80.0–100.0)

## 2010-03-28 LAB — URINALYSIS, ROUTINE W REFLEX MICROSCOPIC
Bilirubin Urine: NEGATIVE
Bilirubin Urine: NEGATIVE
Glucose, UA: NEGATIVE mg/dL
Glucose, UA: NEGATIVE mg/dL
Ketones, ur: NEGATIVE mg/dL
Ketones, ur: NEGATIVE mg/dL
Nitrite: NEGATIVE
Protein, ur: 100 mg/dL — AB
Specific Gravity, Urine: 1.019 (ref 1.005–1.030)
Specific Gravity, Urine: 1.012 (ref 1.005–1.030)
Urobilinogen, UA: 0.2 mg/dL (ref 0.0–1.0)
pH: 5.5 (ref 5.0–8.0)
pH: 7.5 (ref 5.0–8.0)

## 2010-03-28 LAB — GLUCOSE, CAPILLARY
Glucose-Capillary: 102 mg/dL — ABNORMAL HIGH (ref 70–99)
Glucose-Capillary: 109 mg/dL — ABNORMAL HIGH (ref 70–99)
Glucose-Capillary: 109 mg/dL — ABNORMAL HIGH (ref 70–99)
Glucose-Capillary: 114 mg/dL — ABNORMAL HIGH (ref 70–99)
Glucose-Capillary: 126 mg/dL — ABNORMAL HIGH (ref 70–99)
Glucose-Capillary: 129 mg/dL — ABNORMAL HIGH (ref 70–99)
Glucose-Capillary: 129 mg/dL — ABNORMAL HIGH (ref 70–99)
Glucose-Capillary: 132 mg/dL — ABNORMAL HIGH (ref 70–99)
Glucose-Capillary: 133 mg/dL — ABNORMAL HIGH (ref 70–99)
Glucose-Capillary: 137 mg/dL — ABNORMAL HIGH (ref 70–99)
Glucose-Capillary: 162 mg/dL — ABNORMAL HIGH (ref 70–99)
Glucose-Capillary: 57 mg/dL — ABNORMAL LOW (ref 70–99)
Glucose-Capillary: 70 mg/dL (ref 70–99)
Glucose-Capillary: 71 mg/dL (ref 70–99)
Glucose-Capillary: 79 mg/dL (ref 70–99)

## 2010-03-28 LAB — CBC
HCT: 23.5 % — ABNORMAL LOW (ref 39.0–52.0)
HCT: 25.8 % — ABNORMAL LOW (ref 39.0–52.0)
HCT: 26.1 % — ABNORMAL LOW (ref 39.0–52.0)
HCT: 29.9 % — ABNORMAL LOW (ref 39.0–52.0)
HCT: 29.9 % — ABNORMAL LOW (ref 39.0–52.0)
HCT: 33.8 % — ABNORMAL LOW (ref 39.0–52.0)
Hemoglobin: 10 g/dL — ABNORMAL LOW (ref 13.0–17.0)
Hemoglobin: 10.3 g/dL — ABNORMAL LOW (ref 13.0–17.0)
Hemoglobin: 8.1 g/dL — ABNORMAL LOW (ref 13.0–17.0)
Hemoglobin: 9 g/dL — ABNORMAL LOW (ref 13.0–17.0)
Hemoglobin: 11.2 g/dL — ABNORMAL LOW (ref 13.0–17.0)
MCH: 28.8 pg (ref 26.0–34.0)
MCH: 28.8 pg (ref 26.0–34.0)
MCH: 29 pg (ref 26.0–34.0)
MCH: 29.1 pg (ref 26.0–34.0)
MCH: 28.1 pg (ref 26.0–34.0)
MCHC: 33.4 g/dL (ref 30.0–36.0)
MCHC: 34.4 g/dL (ref 30.0–36.0)
MCHC: 34.5 g/dL (ref 30.0–36.0)
MCHC: 34.9 g/dL (ref 30.0–36.0)
MCHC: 34.9 g/dL (ref 30.0–36.0)
MCV: 83.2 fL (ref 78.0–100.0)
MCV: 83.6 fL (ref 78.0–100.0)
MCV: 84.5 fL (ref 78.0–100.0)
MCV: 85.6 fL (ref 78.0–100.0)
MCV: 86.2 fL (ref 78.0–100.0)
Platelets: 111 10*3/uL — ABNORMAL LOW (ref 150–400)
Platelets: 133 10*3/uL — ABNORMAL LOW (ref 150–400)
Platelets: 137 10*3/uL — ABNORMAL LOW (ref 150–400)
Platelets: 144 10*3/uL — ABNORMAL LOW (ref 150–400)
Platelets: 157 10*3/uL (ref 150–400)
Platelets: 172 10*3/uL (ref 150–400)
RBC: 2.81 MIL/uL — ABNORMAL LOW (ref 4.22–5.81)
RBC: 3.05 MIL/uL — ABNORMAL LOW (ref 4.22–5.81)
RBC: 3.1 MIL/uL — ABNORMAL LOW (ref 4.22–5.81)
RBC: 3.47 MIL/uL — ABNORMAL LOW (ref 4.22–5.81)
RBC: 3.54 MIL/uL — ABNORMAL LOW (ref 4.22–5.81)
RBC: 3.99 MIL/uL — ABNORMAL LOW (ref 4.22–5.81)
RDW: 14.5 % (ref 11.5–15.5)
RDW: 14.8 % (ref 11.5–15.5)
RDW: 15.5 % (ref 11.5–15.5)
RDW: 15.6 % — ABNORMAL HIGH (ref 11.5–15.5)
RDW: 16.4 % — ABNORMAL HIGH (ref 11.5–15.5)
WBC: 17.1 10*3/uL — ABNORMAL HIGH (ref 4.0–10.5)
WBC: 17.6 10*3/uL — ABNORMAL HIGH (ref 4.0–10.5)
WBC: 19.2 10*3/uL — ABNORMAL HIGH (ref 4.0–10.5)
WBC: 19.5 10*3/uL — ABNORMAL HIGH (ref 4.0–10.5)
WBC: 19.6 10*3/uL — ABNORMAL HIGH (ref 4.0–10.5)
WBC: 21.6 10*3/uL — ABNORMAL HIGH (ref 4.0–10.5)

## 2010-03-28 LAB — CULTURE, RESPIRATORY W GRAM STAIN
Culture: NO GROWTH
Gram Stain: NONE SEEN

## 2010-03-28 LAB — COMPREHENSIVE METABOLIC PANEL
ALT: 20 U/L (ref 0–53)
ALT: 26 U/L (ref 0–53)
AST: 70 U/L — ABNORMAL HIGH (ref 0–37)
AST: 92 U/L — ABNORMAL HIGH (ref 0–37)
Albumin: 2.6 g/dL — ABNORMAL LOW (ref 3.5–5.2)
Albumin: 2.6 g/dL — ABNORMAL LOW (ref 3.5–5.2)
Alkaline Phosphatase: 36 U/L — ABNORMAL LOW (ref 39–117)
Alkaline Phosphatase: 42 U/L (ref 39–117)
BUN: 32 mg/dL — ABNORMAL HIGH (ref 6–23)
CO2: 24 mEq/L (ref 19–32)
Calcium: 8.1 mg/dL — ABNORMAL LOW (ref 8.4–10.5)
Calcium: 8.3 mg/dL — ABNORMAL LOW (ref 8.4–10.5)
Chloride: 112 mEq/L (ref 96–112)
Creatinine, Ser: 1.81 mg/dL — ABNORMAL HIGH (ref 0.4–1.5)
GFR calc Af Amer: 46 mL/min — ABNORMAL LOW (ref >60)
GFR calc Af Amer: 50 mL/min — ABNORMAL LOW (ref >60)
GFR calc non Af Amer: 38 mL/min — ABNORMAL LOW (ref >60)
Glucose, Bld: 109 mg/dL — ABNORMAL HIGH (ref 70–99)
Glucose, Bld: 143 mg/dL — ABNORMAL HIGH (ref 70–99)
Potassium: 3.8 mEq/L (ref 3.5–5.1)
Potassium: 4.4 mEq/L (ref 3.5–5.1)
Sodium: 141 mEq/L (ref 135–145)
Sodium: 143 mEq/L (ref 135–145)
Total Bilirubin: 1 mg/dL (ref 0.3–1.2)
Total Protein: 4.3 g/dL — ABNORMAL LOW (ref 6.0–8.3)
Total Protein: 5.1 g/dL — ABNORMAL LOW (ref 6.0–8.3)

## 2010-03-28 LAB — URINE CULTURE
Colony Count: NO GROWTH
Culture  Setup Time: 201109290934
Culture: NO GROWTH

## 2010-03-28 LAB — POCT I-STAT, CHEM 8
BUN: 36 mg/dL — ABNORMAL HIGH (ref 6–23)
Calcium, Ion: 1.19 mmol/L (ref 1.12–1.32)
Calcium, Ion: 1.21 mmol/L (ref 1.12–1.32)
Creatinine, Ser: 1.5 mg/dL (ref 0.4–1.5)
Creatinine, Ser: 1.9 mg/dL — ABNORMAL HIGH (ref 0.4–1.5)
Glucose, Bld: 120 mg/dL — ABNORMAL HIGH (ref 70–99)
Glucose, Bld: 128 mg/dL — ABNORMAL HIGH (ref 70–99)
HCT: 31 % — ABNORMAL LOW (ref 39.0–52.0)
Hemoglobin: 10.5 g/dL — ABNORMAL LOW (ref 13.0–17.0)
Potassium: 3.7 mEq/L (ref 3.5–5.1)
TCO2: 22 mmol/L (ref 0–100)

## 2010-03-28 LAB — CARDIAC PANEL(CRET KIN+CKTOT+MB+TROPI)
CK, MB: 4.6 ng/mL — ABNORMAL HIGH (ref 0.3–4.0)
Troponin I: 0.3 ng/mL — ABNORMAL HIGH (ref 0.00–0.06)

## 2010-03-28 LAB — LIPID PANEL
Cholesterol: 54 mg/dL (ref 0–200)
HDL: 14 mg/dL — ABNORMAL LOW (ref >39)
Total CHOL/HDL Ratio: 3.9 RATIO
VLDL: 11 mg/dL (ref 0–40)

## 2010-03-28 LAB — RAPID URINE DRUG SCREEN, HOSP PERFORMED
Amphetamines: NOT DETECTED
Barbiturates: NOT DETECTED

## 2010-03-28 LAB — PLATELET COUNT: Platelets: 189 10*3/uL (ref 150–400)

## 2010-03-28 LAB — PREPARE PLATELETS

## 2010-03-28 LAB — PREPARE FRESH FROZEN PLASMA

## 2010-03-28 LAB — MRSA PCR SCREENING: MRSA by PCR: NEGATIVE

## 2010-03-28 LAB — T4, FREE: Free T4: 0.98 ng/dL (ref 0.80–1.80)

## 2010-03-28 LAB — FOLATE: Folate: 9.3 ng/mL

## 2010-03-28 LAB — CK TOTAL AND CKMB (NOT AT ARMC)
CK, MB: 4.1 ng/mL — ABNORMAL HIGH (ref 0.3–4.0)
Relative Index: 2.6 — ABNORMAL HIGH (ref 0.0–2.5)

## 2010-03-28 LAB — BRAIN NATRIURETIC PEPTIDE: Pro B Natriuretic peptide (BNP): 469 pg/mL — ABNORMAL HIGH (ref 0.0–100.0)

## 2010-03-28 LAB — CREATININE, SERUM
Creatinine, Ser: 1.9 mg/dL — ABNORMAL HIGH (ref 0.4–1.5)
GFR calc Af Amer: 43 mL/min — ABNORMAL LOW (ref >60)
GFR calc non Af Amer: 36 mL/min — ABNORMAL LOW (ref >60)

## 2010-03-28 LAB — PROTIME-INR
INR: 1.83 — ABNORMAL HIGH (ref 0.00–1.49)
Prothrombin Time: 21.3 seconds — ABNORMAL HIGH (ref 11.6–15.2)

## 2010-03-28 LAB — IRON AND TIBC
Saturation Ratios: 22 % (ref 20–55)
UIBC: 145 ug/dL

## 2010-03-28 LAB — POCT CARDIAC MARKERS: Myoglobin, poc: >500 ng/mL (ref 12–200)

## 2010-03-28 LAB — POTASSIUM: Potassium: 3.9 mEq/L (ref 3.5–5.1)

## 2010-03-28 LAB — APTT: aPTT: 47 seconds — ABNORMAL HIGH (ref 24–37)

## 2010-03-28 LAB — VITAMIN B12: Vitamin B-12: 470 pg/mL (ref 211–911)

## 2010-03-28 LAB — MAGNESIUM: Magnesium: 2.3 mg/dL (ref 1.5–2.5)

## 2010-05-28 NOTE — Assessment & Plan Note (Signed)
OFFICE VISIT   [name], [name]  DOB:  29-Mar-1945                                        December 05, 2009  CHART #:  [id number]   CURRENT PROBLEMS:  1. Status post emergency patch reconstructive with post myocardial      infarction ventriculoseptal defect and coronary artery bypass graft      x4 on October 08, 2009.  2. Postoperative left pleural effusion, now resolved.  3. Hypertension.   CURRENT MEDICATIONS:  1. Norvasc 10 mg daily.  2. Aspirin 325 mg daily.  3. Coreg 12.5 mg b.i.d.  4. Crestor 40 mg daily.  5. Lasix 20 mg daily.  6. Potassium 10 mEq daily.   LABORATORY DATA:  Last BUN 21, potassium 4.1.  Hemoglobin 9.2.   PRESENT ILLNESS:  The patient returns for his final surgical follow up  after undergoing emergency CABG with repair of a post myocardial  infarction ventriculoseptal defect over 2 months ago.  He was walking  and progressing well at the rehab facility.  He is ready to return to  independent living once an apartment is identified.  He denies angina or  symptoms of CHF.  He has had recurrent left pleural effusion which is  now resolved on daily Lasix 20 mg dose.  The incisions are all well  healed.   PHYSICAL EXAMINATION:  Blood pressure 116/70, pulse 70, respirations 18,  saturation 98%.  He is alert and pleasant.  Breath sounds are clear and  equal.  Cardiac rhythm is regular without S3, gallop, or murmur, and he  has mild 1+ ankle edema.   IMPRESSION AND PLAN:  The patient will return to his rehab facility  until an apartment is identified.  We will try to establish him with the  [facility] for his primary care.  He was  advised to continue to be smoke-free and to continue the current  medications listed above.  I did stop the Norvasc as his blood pressure  today is [id number] systolic and on last exam it was only 120, but he needs to  remain on the Coreg.   Ivin Poot, M.D.  Electronically  Signed   PV/MEDQ  D:  12/05/2009  T:  12/06/2009  Job:  [id number]   cc:   Vanna Scotland. Olevia Perches, MD, [facility].

## 2010-05-28 NOTE — Assessment & Plan Note (Signed)
OFFICE VISIT   CASEY, SADLOWSKI  DOB:  09/12/1945                                        November 14, 2009  CHART #:  [id number]   CURRENT PROBLEMS:  1. Status post emergency coronary artery bypass graft x4 and patch      repair of a post myocardial infarction ventricular septal defect,      October 08, 2009.  2. Postoperative left pleural effusion.  3. Hypertension.   PRESENT ILLNESS:  The patient returns for a 5-week followup after  undergoing an emergency CABG x4 as well as a patch repair of a posterior  post MI VSD in late September.  He is currently living at [facility].  He is ambulating 200-300 feet at a time.  He was seen a  week ago with a chest x-ray which showed a left moderate pleural  effusion.  He was placed back on Lasix 40 mg a day.  He returns now  reporting his cough and dyspnea have improved as well as his ankle  edema.  The surgical incisions are healing well and he has no angina and  his weight has been stable.  Overall, he is getting stronger.   CURRENT MEDICATIONS:  1. Norvasc 10 mg daily.  2. Aspirin 325 mg daily.  3. Coreg 12.5 mg b.i.d.  4. Plavix 75 mg a day.  5. Crestor 40 mg nightly.  6. Ultram p.r.n. pain.   PHYSICAL EXAMINATION:  Vital Signs:  Blood pressure 120/70, pulse 80,  respirations 18, and saturation 100%.  General:  He is alert and  pleasant.  Lungs:  Breath sounds are clear and equal.  Sternal incision  is well healed.  Cardiac:  Rhythm is regular without gallop or murmur.  Extremities:  His leg incision from the saphenous vein harvest is  healing and there is no significant pedal edema.   DIAGNOSTIC TESTS:  A PA and lateral chest x-ray today shows significant  improvement in the left pleural effusion, which is now minimal.   IMPRESSION AND PLAN:  Stable course and good recovery in the early  period following emergency bypass surgery and ventricular septal defect  repair.  We will keep him on  Lasix 20 mg a day for 1 month and follow up  with a chest x-ray.  He will return to [place], but he is strong  enough for independent living now if an apartment can be found by the  Social Service Department.  He will continue his current medications as  listed above except the Plavix and Mucinex will be discontinued at this  time.   Ivin Poot, M.D.  Electronically Signed   PV/MEDQ  D:  11/14/2009  T:  11/14/2009  Job:  [id number]   cc:   Vanna Scotland. Olevia Perches, MD, [facility]

## 2010-05-28 NOTE — Assessment & Plan Note (Signed)
OFFICE VISIT   [name], [name]  DOB:  1945/09/18                                        November 05, 2009  CHART #:  [id number]   HISTORY OF PRESENT ILLNESS:  The patient is status post emergency  coronary artery bypass grafting x4 as well as repair of postinfarction  posterior ventricular septal defect using a double patch technique of  Teflon felt.  This was done by Dr. Prescott Gum on October 08, 2009.  Intraoperatively, the patient had difficulty with Foley catheter  insertion and Dr. Gaynelle Arabian was consulted who performed a cystoscopy  which showed a urethral stricture and he was able to place a Foley  catheter.  The patient had an intra-aortic balloon pump placed  preoperatively.  The patient's postoperative course was unremarkable.  His balloon pump was able to be discontinued without difficulty.  He is  voiding without difficulty following removal of Foley catheter.  At that  time of discharge, it was felt that the patient would need a short-term  skilled nursing facility and he was discharged to [facility].  The patient presents today for his 3-week followup  visit.  The patient feels that he is progressing well.  He states he is  up ambulating throughout the day without difficulty.  He denies any  incisional pain, chest pain, shortness of breath, nausea, vomiting, or  opening or drainage from any of his incision sites.  He states he is  sleeping well at night and tolerating diet well.  Per the patient, he is  currently looking for a place to go following discharge from the  [facility].  The patient does have an  appointment to see Dr. Olevia Perches this coming Wednesday.   PHYSICAL EXAMINATION:  Vital Signs:  Blood pressure of 111/70, pulse of  72, respirations of 18, and O2 sats 97% on room air.  Respiratory:  Diminished breath sounds at the left base.  Cardiac:  Regular rate and  rhythm.  No murmurs,  gallops, or rubs noted.  Chest:  Sternum is stable.  Abdomen:  Bowel sounds x4.  Soft and nontender.  Extremities:  No edema  noted.  Incisions:  All incisions are clean, dry, and intact and healing  well.   STUDIES:  The patient had a PA and lateral chest x-ray obtained today  which shows a moderate left pleural effusion noted.  The sternal wires  are intact.  No signs of pneumothorax or airspace.   IMPRESSION AND PLAN:  The patient is progressing well following  emergency coronary artery bypass grafting.  He is seen with a moderate  left pleural effusion today.  The patient currently is not on any Lasix.  He is asymptomatic and O2 sats 97% on room air.  I will try a week's  worth of Lasix and bring him back in to see Dr. Prescott Gum next Wednesday  with a repeat PA and lateral chest x-ray.  At this time if this  persistent, the patient will require left thoracentesis.  The patient is  to continue ambulating 3-4 times per day.  He is to continue using his  incentive spirometer.  We will continue all current medications at this  time.  The patient is to keep appointment with Dr. Olevia Perches on Wednesday.  The patient is  instructed no heavy lifting over 10 pounds for another 2  months.  The patient is in agreement.  In the interim if the patient has  any surgical concerns or questions, he is to contact us and we can see  him sooner.   Iverson Alamin, PA   KMD/MEDQ  D:  11/05/2009  T:  11/06/2009  Job:  [id number]   cc:   Vanna Scotland. Olevia Perches, MD, [facility]

## 2010-06-14 ENCOUNTER — Encounter: Payer: Self-pay | Admitting: Internal Medicine

## 2010-07-01 ENCOUNTER — Ambulatory Visit (INDEPENDENT_AMBULATORY_CARE_PROVIDER_SITE_OTHER): Payer: Medicaid Other | Admitting: Internal Medicine

## 2010-07-01 ENCOUNTER — Encounter: Payer: Self-pay | Admitting: Internal Medicine

## 2010-07-01 VITALS — BP 122/88 | HR 63 | Ht 66.0 in | Wt 180.0 lb

## 2010-07-01 DIAGNOSIS — I2510 Atherosclerotic heart disease of native coronary artery without angina pectoris: Secondary | ICD-10-CM

## 2010-07-01 DIAGNOSIS — R0602 Shortness of breath: Secondary | ICD-10-CM

## 2010-07-01 DIAGNOSIS — E785 Hyperlipidemia, unspecified: Secondary | ICD-10-CM | POA: Insufficient documentation

## 2010-07-01 MED ORDER — LISINOPRIL 10 MG PO TABS: 10.0000 mg | ORAL_TABLET | Freq: Every day | ORAL | Status: DC

## 2010-07-01 NOTE — Assessment & Plan Note (Signed)
No evidence of ischemia. Continue current regimen.   

## 2010-07-01 NOTE — Patient Instructions (Signed)
Increase Lisinopril to 10 mg daily  Your physician recommends that you return for a FASTING lipid, liver, bmet, bnp (414.01, 272.1) in 2 weeks  Your physician wants you to follow-up in: 6 months.  You will receive a reminder letter in the mail two months in advance. If you don't receive a letter, please call our office to schedule the follow-up appointment.

## 2010-07-01 NOTE — Assessment & Plan Note (Signed)
Goal LDL < 70. Continue statin. Check lipids/liver.

## 2010-07-01 NOTE — Assessment & Plan Note (Signed)
Doing well. NYHA I symptoms. Volume status looks good. Increase lisinopril to 10 daily. Check labs 2 weeks.

## 2010-07-01 NOTE — Assessment & Plan Note (Signed)
Blood pressure well controlled. Increasing lisinopril for HF.

## 2010-07-01 NOTE — Progress Notes (Signed)
HPI:  [name] is a 65 year old male who experienced an acute inferior MI in September 2011 due to occlusion of a PL branch. Cath with severe 3-V CAD with TIMI-3 flow in the PL branch. Post MI course c/b acute VSD ao taken emergency CABG x4 with LIMA to the LAD, SVG to the diagonal, SVG to the circumflex marginal, and SVG to the PDA as well as repair of entricular septal defect in September 2011.  Discharged to [facility] rehab facility. Post-op had persistent L pleural effusion which resolved with Lasix.   Echo November 2011, EF ~35-40% range with akinesis of mid to distal inferior wall, apex and septum. VSD patch stable. Mild MR.   Doing very well. Walking all around. No CP or SOB. No orthopnea or PND. Compliant with meds.    ROS: All systems negative except as listed in HPI, PMH and Problem List.  Past Medical History  Diagnosis Date  . Acute MI, inferior wall   . Coronary artery disease     severe 3 vessel   . Hypertension   . History of tobacco abuse   . Urethral stricture due to infective diseases classified elsewhere     Current Outpatient Prescriptions  Medication Sig Dispense Refill  . amLODipine (NORVASC) 10 MG tablet Take 10 mg by mouth daily.        . carvedilol (COREG) 12.5 MG tablet Take 12.5 mg by mouth 2 (two) times daily with a meal.        . clopidogrel (PLAVIX) 75 MG tablet Take 75 mg by mouth daily.        Marland Kitchen lisinopril (PRINIVIL,ZESTRIL) 5 MG tablet Take 5 mg by mouth daily.        . rosuvastatin (CRESTOR) 40 MG tablet Take 40 mg by mouth daily.        Marland Kitchen DISCONTD: aspirin 325 MG buffered tablet Take 325 mg by mouth daily.        Marland Kitchen DISCONTD: traMADol (ULTRAM) 50 MG tablet Take 50 mg by mouth as needed.           PHYSICAL EXAM: Filed Vitals:   07/01/10 1618  BP: 122/88  Pulse: 63   Well-nournished, in no acute distress. Neck: supple no JVD. carotids 2+ bilaterall Lungs: clear without wheezing, rales, or rhonchi Cardiovascular: RRR, PMI not displaced, heart  sounds normal 2/6 systolic murmur at the apex, no gallops, bruit, thrill, or heave. Abdomen: BS normal. Soft without organomegaly, masses, lesions or tenderness. Extremities:tr edema without cyanosis, clubbing . Good distal pulses bilateral SKin: Warm, no lesions or rashes  Musculoskeletal: No deformities Neuro: no focal signs   ECG: SR 63 inferior Qs. Lateral TWI (no changes)   ASSESSMENT & PLAN:

## 2010-07-16 ENCOUNTER — Other Ambulatory Visit: Payer: PRIVATE HEALTH INSURANCE | Admitting: *Deleted

## 2010-08-15 ENCOUNTER — Other Ambulatory Visit: Payer: Self-pay | Admitting: Internal Medicine

## 2010-08-29 ENCOUNTER — Emergency Department (HOSPITAL_COMMUNITY): Payer: PRIVATE HEALTH INSURANCE

## 2010-08-29 ENCOUNTER — Inpatient Hospital Stay (HOSPITAL_COMMUNITY)
Admission: EM | Admit: 2010-08-29 | Discharge: 2010-09-03 | DRG: 690 | Disposition: A | Discharge status: Home / Self Care | Payer: PRIVATE HEALTH INSURANCE | Attending: Internal Medicine | Admitting: Internal Medicine

## 2010-08-29 ENCOUNTER — Encounter (HOSPITAL_COMMUNITY): Payer: Self-pay | Admitting: Radiology

## 2010-08-29 DIAGNOSIS — I2510 Atherosclerotic heart disease of native coronary artery without angina pectoris: Secondary | ICD-10-CM | POA: Diagnosis present

## 2010-08-29 DIAGNOSIS — Z7982 Long term (current) use of aspirin: Secondary | ICD-10-CM

## 2010-08-29 DIAGNOSIS — Z5900 Homelessness unspecified: Secondary | ICD-10-CM

## 2010-08-29 DIAGNOSIS — N189 Chronic kidney disease, unspecified: Secondary | ICD-10-CM | POA: Diagnosis present

## 2010-08-29 DIAGNOSIS — E785 Hyperlipidemia, unspecified: Secondary | ICD-10-CM | POA: Diagnosis present

## 2010-08-29 DIAGNOSIS — N1330 Unspecified hydronephrosis: Secondary | ICD-10-CM | POA: Diagnosis present

## 2010-08-29 DIAGNOSIS — I129 Hypertensive chronic kidney disease with stage 1 through stage 4 chronic kidney disease, or unspecified chronic kidney disease: Secondary | ICD-10-CM | POA: Diagnosis present

## 2010-08-29 DIAGNOSIS — R339 Retention of urine, unspecified: Secondary | ICD-10-CM | POA: Diagnosis present

## 2010-08-29 DIAGNOSIS — Z951 Presence of aortocoronary bypass graft: Secondary | ICD-10-CM

## 2010-08-29 DIAGNOSIS — N10 Acute tubulo-interstitial nephritis: Principal | ICD-10-CM | POA: Diagnosis present

## 2010-08-29 DIAGNOSIS — N179 Acute kidney failure, unspecified: Secondary | ICD-10-CM | POA: Diagnosis present

## 2010-08-29 DIAGNOSIS — E876 Hypokalemia: Secondary | ICD-10-CM | POA: Diagnosis present

## 2010-08-29 DIAGNOSIS — N35919 Unspecified urethral stricture, male, unspecified site: Secondary | ICD-10-CM | POA: Diagnosis present

## 2010-08-29 LAB — URINE MICROSCOPIC-ADD ON

## 2010-08-29 LAB — COMPREHENSIVE METABOLIC PANEL
AST: 13 U/L (ref 0–37)
Albumin: 2.9 g/dL — ABNORMAL LOW (ref 3.5–5.2)
Alkaline Phosphatase: 76 U/L (ref 39–117)
BUN: 26 mg/dL — ABNORMAL HIGH (ref 6–23)
Chloride: 103 mEq/L (ref 96–112)
Potassium: 3.8 mEq/L (ref 3.5–5.1)
Sodium: 137 mEq/L (ref 135–145)
Total Protein: 8.7 g/dL — ABNORMAL HIGH (ref 6.0–8.3)

## 2010-08-29 LAB — URINALYSIS, ROUTINE W REFLEX MICROSCOPIC
Bilirubin Urine: NEGATIVE
Glucose, UA: NEGATIVE mg/dL
Ketones, ur: NEGATIVE mg/dL
Protein, ur: 100 mg/dL — AB

## 2010-08-29 LAB — DIFFERENTIAL
Eosinophils Relative: 2 % (ref 0–5)
Lymphocytes Relative: 22 % (ref 12–46)
Lymphs Abs: 2.6 10*3/uL (ref 0.7–4.0)
Neutro Abs: 7.5 10*3/uL (ref 1.7–7.7)

## 2010-08-29 LAB — CBC
HCT: 30.5 % — ABNORMAL LOW (ref 39.0–52.0)
MCHC: 33.4 g/dL (ref 30.0–36.0)
Platelets: 450 10*3/uL — ABNORMAL HIGH (ref 150–400)
RDW: 14.3 % (ref 11.5–15.5)
WBC: 11.8 10*3/uL — ABNORMAL HIGH (ref 4.0–10.5)

## 2010-08-29 LAB — LIPASE, BLOOD: Lipase: 82 U/L — ABNORMAL HIGH (ref 11–59)

## 2010-08-30 ENCOUNTER — Inpatient Hospital Stay (HOSPITAL_COMMUNITY): Payer: PRIVATE HEALTH INSURANCE

## 2010-08-30 LAB — URINE CULTURE
Colony Count: NO GROWTH
Culture  Setup Time: 201208162318

## 2010-08-30 LAB — DIFFERENTIAL
Eosinophils Relative: 1 % (ref 0–5)
Lymphocytes Relative: 24 % (ref 12–46)
Lymphs Abs: 2.9 10*3/uL (ref 0.7–4.0)
Monocytes Relative: 12 % (ref 3–12)

## 2010-08-30 LAB — CBC
HCT: 27.3 % — ABNORMAL LOW (ref 39.0–52.0)
MCH: 26.9 pg (ref 26.0–34.0)
MCV: 80.8 fL (ref 78.0–100.0)
RBC: 3.38 MIL/uL — ABNORMAL LOW (ref 4.22–5.81)
RDW: 14.2 % (ref 11.5–15.5)
WBC: 12.1 10*3/uL — ABNORMAL HIGH (ref 4.0–10.5)

## 2010-08-30 LAB — CARDIAC PANEL(CRET KIN+CKTOT+MB+TROPI)
CK, MB: 1.8 ng/mL (ref 0.3–4.0)
CK, MB: 1.9 ng/mL (ref 0.3–4.0)
Relative Index: INVALID (ref 0.0–2.5)
Total CK: 73 U/L (ref 7–232)
Troponin I: <0.3 ng/mL (ref <0.30)

## 2010-08-30 LAB — BASIC METABOLIC PANEL
BUN: 26 mg/dL — ABNORMAL HIGH (ref 6–23)
CO2: 23 mEq/L (ref 19–32)
Chloride: 105 mEq/L (ref 96–112)
Creatinine, Ser: 1.9 mg/dL — ABNORMAL HIGH (ref 0.50–1.35)

## 2010-08-31 LAB — CBC
MCH: 27.2 pg (ref 26.0–34.0)
MCV: 80.9 fL (ref 78.0–100.0)
Platelets: 403 10*3/uL — ABNORMAL HIGH (ref 150–400)
RBC: 3.2 MIL/uL — ABNORMAL LOW (ref 4.22–5.81)
RDW: 14.3 % (ref 11.5–15.5)

## 2010-08-31 LAB — BASIC METABOLIC PANEL
CO2: 21 mEq/L (ref 19–32)
Calcium: 9 mg/dL (ref 8.4–10.5)
Creatinine, Ser: 1.86 mg/dL — ABNORMAL HIGH (ref 0.50–1.35)

## 2010-08-31 NOTE — H&P (Signed)
[name], KASPAR NO.:  [id number]  MEDICAL RECORD NO.:  [id number]  LOCATION:  [id number]                         FACILITY:  [facility]  PHYSICIAN:  Thornton Dales, MD   DATE OF BIRTH:  01/27/1945  DATE OF ADMISSION:  08/29/2010 DATE OF DISCHARGE:                             HISTORY & PHYSICAL   PRIMARY CARE PHYSICIAN:  Unassigned.  CHIEF COMPLAINT:  Right flank pain.  HISTORY OF PRESENT ILLNESS:  A 65 year old gentleman, who presented with right flank pain for the past one week, which has gotten progressively worse over the past few days.  Pain is described as cramping and tearing about 8/10 in severity.  The patient also complained of increased urinary frequency but no dysuria.  He has a history of urethral stricture, but has no difficulty passing urine at this time.  The patient was seen in the emergency room and evaluation showed evidence of urinary tract infection with CT of the abdomen showing bilateral hydronephrosis and ureterectasis, right worse than left, and diffusely thick bladder wall, which is also enlarged and apparent focal thickening in the inferior lateral aspect of the urinary bladder, which may warrant cystoscopy.  The patient denies any abdominal pain, hematuria, fever, nausea, vomiting or diaphoresis.  He had myocardial infarction last year and subsequently had coronary artery bypass grafting, that was in September 2011.  He currently denies any chest pain or shortness of breath.  PAST MEDICAL HISTORY:  Includes: 1. Hypertension. 2. Coronary artery disease, status post CABG. 3. Hyperlipidemia. 4. History of myocardial infarction. 5. History of urethral stricture.  PAST SURGICAL HISTORY:  CABG, right index fingertip amputation.  MEDICATION:  Coreg, Crestor, Norvasc, and lisinopril.  SOCIAL HISTORY:  Quit smoking in September of last year.  No alcohol or drug use .  FAMILY HISTORY:  Noncontributory.  REVIEW OF SYMPTOMS:  A  10-point review of systems is negative except as described above.  PHYSICAL EXAMINATION:  VITAL SIGNS: Blood pressure is 131/83, pulse 74, respirations 20, temperature is 98.7, O2 saturation is 100%. GENERAL: The patient looks comfortable, in no distress. HEENT: Pallor.  Extraocular muscles are intact.  Mouth is moist. NECK: Supple.  No JVD, adenopathy, or thyromegaly. LUNGS: Clear bilaterally to auscultation.  No wheezing or crackles. HEART: S1, S2.  No murmurs, rubs or gallops. ABDOMEN:  Full, soft, nontender.  Bowel sounds present.  No masses. BACK: The patient has right costovertebral angle tenderness. EXTREMITIES:  No edema, clubbing or cyanosis. NEUROLOGIC: Speech is clear.  Cranial nerves II-XII intact. Coordination and motor function appear preserved in all extremities. SKIN: No rash or lesion. LYMPHATICS: No lymph gland swelling. PSYCHIATRY: Normal mood and affect.  LABORATORY:  White count is 11.8 with no left shift, hemoglobin 10.2, and platelet count 450.  Chemistry: Sodium 137, potassium 3.8, BUN 26, creatinine 2.01, this is slightly worse compared to his baseline creatinine of 1.1 in December of last year.  Urinalysis suggests urinary tract infection with WBC too numerous to count, bacteria few, leukocyte esterase large, and nitrite negative.  CT of the abdomen has been described above showing bilateral hydronephrosis and focal thickening of the inferior or lateral aspect of the urinary bladder.  A 12-lead EKG is pending.  ASSESSMENT:  A 65 year old gentleman admitted with: 1. Right flank pain with evidence of urinary tract infection, this     probably suggest pyelonephritis. 2. Leukocytosis likely secondary to one. 3. Acute on chronic kidney failure with baseline creatinine of 1.1. 4. History of coronary artery disease, currently, cardiac enzymes will     be cycled. 5. Hypertension which is fairly stable.  PLAN:  Admit to telemetry.  The patient will be given  ceftriaxone. Urine culture and blood culture will be sent.  Follow CBC, check cardiac enzymes, check TSH.  In view of reported hydronephrosis on CT which is likely chronic probably from the patient's known history of urethral stricture, the patient will benefit from bilateral renal ultrasound to further evaluate the degree of hydronephrosis.  I will also recommend evaluation by a urologist while on this admission.  Will benefit from cystoscopy in the near future.  In any anyway for his acute on chronic kidney failure, we have given IV fluids, nephrotoxics will be on hold and BNP should be followed.  Overall condition is stable.     Thornton Dales, MD     FA/MEDQ  D:  08/30/2010  T:  08/30/2010  Job:  [id number]  Electronically Signed by Thornton Dales  on 08/31/2010 06:26:12 AM

## 2010-09-01 LAB — BASIC METABOLIC PANEL
CO2: 22 mEq/L (ref 19–32)
Calcium: 9.3 mg/dL (ref 8.4–10.5)
GFR calc Af Amer: 50 mL/min — ABNORMAL LOW (ref >60)
GFR calc non Af Amer: 41 mL/min — ABNORMAL LOW (ref >60)
Sodium: 140 mEq/L (ref 135–145)

## 2010-09-01 LAB — CBC
MCH: 26.6 pg (ref 26.0–34.0)
MCHC: 32.7 g/dL (ref 30.0–36.0)
Platelets: 457 10*3/uL — ABNORMAL HIGH (ref 150–400)
RBC: 3.27 MIL/uL — ABNORMAL LOW (ref 4.22–5.81)
RDW: 14.8 % (ref 11.5–15.5)

## 2010-09-05 LAB — CULTURE, BLOOD (ROUTINE X 2)
Culture  Setup Time: 201208170847
Culture: NO GROWTH

## 2010-09-05 NOTE — Consult Note (Signed)
[name], [name]                 ACCOUNT NO.:  [id number]  MEDICAL RECORD NO.:  [id number]  LOCATION:  [id number]                         FACILITY:  [facility]  PHYSICIAN:  Arvil Persons, M.D.  DATE OF BIRTH:  21-Mar-1945  DATE OF CONSULTATION:  08/30/2010 DATE OF DISCHARGE:                                CONSULTATION   REASON FOR CONSULTATION:  Bilateral hydronephrosis, pyelonephritis.  The patient is a 65 year old male who had been having back pain for about a week.  The pain got progressively worse and he went to the emergency room.  A CT scan showed bilateral hydronephrosis, right greater than left down to the level of the bladder.  The bladder wall is thickened.  The patient has a history of urethral stricture and had urethral dilation by Dr. Gaynelle Arabian in September 2011 when he was having open heart surgery.  The patient did not to come back for followup and apparently it was a difficult urethral dilation.  Repeat renal ultrasound today shows bilateral hydronephrosis.  The patient denies any hesitancy or straining on urination.  He states that he voids with a good flow and he has nocturia x2.  He denies gross hematuria.  PAST MEDICAL HISTORY:  Positive for: 1. Hypertension. 2. Heart disease. 3. Diabetes.  PAST SURGICAL HISTORY:  He had coronary artery bypass in September 2011.  MEDICATIONS:  Coreg, Crestor, Norvasc, and lisinopril.  ALLERGIES:  No known drug allergies.  FAMILY HISTORY:  His parents are deceased of unknown causes to him.  SOCIAL HISTORY:  He is divorced, has 3 children.  Quit smoking in September last year and does not drink.  REVIEW OF SYSTEMS:  As noted in the HPI and everything else is negative.  PHYSICAL EXAMINATION:  GENERAL:  This is a well-developed 65 year old male who is in no acute distress.  He states that his back pain is a lot better now.  He is alert and oriented to time, place, and person. VITAL SIGNS:  His blood pressure is 130/70, pulse 72,  respirations 18, and temperature 97. HEENT:  His head is normal.  He has pink conjunctivae.  Ears, nose, and throat are within normal limits. NECK:  Supple.  He has no cervical adenopathy.  No thyromegaly. CHEST:  Symmetrical.  He has a well-healed midline sternotomy. LUNGS:  Clear. HEART:  Regular rhythm. ABDOMEN:  Soft, nondistended, and nontender.  He has no CVA tenderness. Kidneys are not palpable.  He has no hepatomegaly, no splenomegaly. Bladder is not distended.  He has no inguinal adenopathy.  No inguinal hernia. GU:  Penis is uncircumcised.  Meatus is normal.  Scrotum is normal.  He has no testicular mass.  Cords and epididymis are within normal limits. EXTREMITIES:  Within normal limits. RECTAL:  Sphincter tone is normal.  Prostate is enlarged, 40 grams without any nodules and seminal vesicles are not palpable.  I independently reviewed the CT scan and the findings are as noted above.  Urine culture is negative.  His BUN is 26, creatinine 1.90, sodium 139, potassium 3.0, glucose 107.  Hemoglobin is 9.1, hematocrit 27.3, and WBC 12.1.  IMPRESSION: 1. Bilateral hydronephrosis. 2. Urethral stricture. 3. Coronary artery disease.  4. Renal insufficiency.  SUGGESTIONS:  The patient needs cystoscopy under anesthesia.  He will probably need urethral dilation.  We will keep him n.p.o. tonight and we will proceed with the procedure under general anesthesia in the morning. Dr. Tresa Moore who is on-call this weekend will do the procedure in the morning.  The patient ate tonight around 6 o'clock and therefore the procedure cannot be done tonight under anesthesia.     Arvil Persons, M.D.     MN/MEDQ  D:  08/30/2010  T:  08/31/2010  Job:  [id number]  Electronically Signed by Hanley Ben M.D. on 09/05/2010 05:16:38 PM

## 2010-09-05 NOTE — Op Note (Signed)
[name], [name] NO.:  [id number]  MEDICAL RECORD NO.:  [id number]  LOCATION:  [id number]                         FACILITY:  [facility]  PHYSICIAN:  Alexis Frock, MD     DATE OF BIRTH:  22-Sep-1945  DATE OF PROCEDURE: DATE OF DISCHARGE:                              OPERATIVE REPORT   PREOPERATIVE DIAGNOSES:  Urinary retention, hydronephrosis, history of urethral stricture.  POSTOPERATIVE DIAGNOSES:  Urinary retention, hydronephrosis, history of urethral stricture.  PROCEDURE: 1. Cystoscopy. 2. Urethral dilation. 3. Foley catheter placement uncomplicated.  SURGEON:  Alexis Frock, MD  FINDINGS: 1. Multifocal urethral stricture of the posterior urethra     approximately 2.5 cm in length, usually bone dilated to 24-French. 2. Trabeculation of the bladder. 3. Lateral displacement of the left ureteral orifice.  ESTIMATED BLOOD LOSS:  Nil.  DRAINS:  A 20-French Foley catheter to straight drain.  SPECIMEN:  None.  INDICATION:  [name] is a 65 year old gentleman with history of a prior urethral stricture disease.  He presented to the emergency room at [facility] yesterday where he was complaining of flank pain and the actual imaging corroborated  bilateral hydronephrosis and a distended bladder worrisome for lower tract obstruction.  __________ Foley catheter passage was tried and unsuccessful at this time  which is consistent with a recurrence of the stricture.  The patient was not in extremis at the time.  Therefore, decision was made to proceed with operative cystoscopy with possible ureteral dilation.  Informed consent was obtained and placed in the medical record.  PROCEDURE IN DETAIL:  The patient being [name], procedure being cystoscopy with urethral dilation was confirmed.  The procedure was carried out.  A time out was performed.  Intravenous antibiotics were confirmed.  After LMA anesthesia was introduced,  the patient was  placed on the table into a low lithotomy position.  A sterile field was created by  prepping and draping the patient's penis, perineum, and proximal thigh using iodine x3.  Next, cystourethroscopy was performed using a 22- French rigid cystoscope with 12 degree lens. Inspection of  the anterior urethra was unremarkable.  Inspection of the posterior urethra  revealed an multifocal stricture disease approximately 4-French in maximum diameter.  The patient did have a urine with crude maneuver suggesting that there was indeed passage, and as such a Glidewire was advanced over which a 24-French UroMax balloon dilation catheter was carefully placed across the urethral strictures.  Dilation catheter was then inflated to a pressure of 16 atmospheres, held for 19 seconds and deflated.  This allowed easy passage of the cystoscope  into the level of the urinary bladder.  There was some mild debris in the urinary bladder.  It was severely trabeculated.  The right ureteral orifice was in normal anatomic position.  The left ureteral orifice was laterally displaced. No masses or palpable lesions were encountered.  Hemostasis appeared excellent, as such a new 20-French Councill tip Foley catheter was placed over a Glidewire ensuring safe passage  into the urinary bladder. 10 mL of water was placed in the balloon and this was connected to straight drain.  The procedure was then terminated.  The  patient tolerated the procedure well.  There were no immediate appreciable complications.  The patient was taken to the Postanesthesia Care Unit in stable condition.          ______________________________ Alexis Frock, MD     TM/MEDQ  D:  08/31/2010  T:  08/31/2010  Job:  [id number]  Electronically Signed by [name] M.D. on 09/05/2010 05:16:48 PM

## 2010-09-18 ENCOUNTER — Other Ambulatory Visit: Payer: Self-pay | Admitting: Internal Medicine

## 2010-10-19 ENCOUNTER — Other Ambulatory Visit: Payer: Self-pay | Admitting: Internal Medicine

## 2010-10-21 ENCOUNTER — Other Ambulatory Visit: Payer: Self-pay

## 2010-10-21 MED ORDER — CARVEDILOL 12.5 MG PO TABS: 12.5000 mg | ORAL_TABLET | Freq: Two times a day (BID) | ORAL | Status: DC

## 2010-10-24 NOTE — Discharge Summary (Signed)
[name], [name] NO.:  [id number]  MEDICAL RECORD NO.:  [id number]  LOCATION:  [id number]                         FACILITY:  [facility]  PHYSICIAN:  Domenic Polite, MD     DATE OF BIRTH:  [birth year]  DATE OF ADMISSION:  08/29/2010 DATE OF DISCHARGE:                              DISCHARGE SUMMARY   PRIMARY CARE PHYSICIAN:  [facility].  PRIMARY CARDIOLOGIST:  Vanna Scotland. Olevia Perches, MD, FACC  UROLOGIST:  Hanley Ben, MD with [facility].  DISCHARGE DIAGNOSES: 1. Urethral stricture, status post urethral dilation and Foley     catheter placement. 2. Bilateral hydronephrosis secondary to urethral stricture. 3. Acute renal failure, chronic kidney disease, multifactorial,     improved. 4. Pyelonephritis. 5. History of coronary artery disease, status post coronary artery     bypass graft back in September 2011. 6. Dyslipidemia. 7. Hypertension. 8. Homelessness.  DISCHARGE MEDICATIONS:  As follows: 1. Tylenol 650 mg q.4 h. p.r.n. 2. Aspirin 325 mg daily. 3. Carvedilol 12.5 mg p.o. b.i.d. 4. Rosuvastatin 40 mg daily. 5. Levofloxacin 500 mg p.o. daily for 3 days.  CONSULTANTS:  Hanley Ben, MD and Alexis Frock, MD, from [facility].  PROCEDURES:  Cystoscopy with urethral dilation and Foley catheter placement by Dr. Tresa Moore on August 31, 2010.  DIAGNOSTIC INVESTIGATIONS: 1. CT of the abdomen and pelvis on August 29, 2010, shows bilateral     hydronephrosis and ureterectasis, right greater than left, etiology     which __________ enlarged and diffusely thickened and apparent     focal thickening of the inferolateral aspect of urinary bladder,     possibly artifactual secondary to underlying distention with an     enlarged patulous urinary bladder and indeterminate compression     deformity with L3, L4, and L5 vertebral bodies. 2. Renal ultrasound on August 30, 2010, severe bilateral     hydronephrosis, a few small renal cyst.  No evidence of  massive     stone, diffusely thick gallbladder.  HOSPITAL COURSE:  [name] is a 65 year old African American gentleman with prior history of CABG presenting  to the hospital with right flank pain.  On evaluation was found to have pyelonephritis, acute renal failure, and bilateral hydronephrosis.  1. Acute pyelonephritis with bilateral hydronephrosis.  He     subsequently underwent cystoscopy with review of his urethral     stricture, status post dilation and a catheter placement.  His     creatinine improved with his urinary dilation as well as giving him     fluids and holding his ACE inhibitor, it has improved to 1.6 at the     time of discharge from 2.0. 2. Acute renal failure, multifactorial improved.  His ACE inhibitor     has been discontinued. 3. CAD, status post CABG stable. 4. Pyelonephritis.  Clinically on admission, he had all the signs and     symptoms with pyelonephritis, which improved with IV Rocephin.     Subsequent cultures are negative in terms of his urine but because     of the clinical response.  He has been treated with 7-day course of     levofloxacin.  IN TERMS  OF FOLLOWUP:  The patient has been set up to follow up with [facility] in 1-2 weeks for voiding trail to remove the catheter and to a voiding trail at that time.  DISCHARGE CONDITION:  Stable.  DISCHARGE FOLLOWUP:  Dr. Janice Norrie, the patient has an appointment for September 17, 2010, or earlier if available.     Domenic Polite, MD     PJ/MEDQ  D:  09/02/2010  T:  09/02/2010  Job:  [id number]  cc:   [facility] Hanley Ben, M.D.  Electronically Signed by Domenic Polite  on 10/24/2010 02:06:04 PM

## 2010-11-28 ENCOUNTER — Other Ambulatory Visit: Payer: Self-pay | Admitting: Internal Medicine

## 2010-11-28 MED ORDER — ROSUVASTATIN CALCIUM 40 MG PO TABS: 40.0000 mg | ORAL_TABLET | Freq: Every day | ORAL | Status: DC

## 2010-11-28 NOTE — Telephone Encounter (Signed)
Pt said crestor 40 mg was not called into [facility]. Pt is now out of RX.

## 2010-12-13 ENCOUNTER — Emergency Department (HOSPITAL_COMMUNITY)
Admission: EM | Admit: 2010-12-13 | Discharge: 2010-12-13 | Disposition: A | Discharge status: Home / Self Care | Payer: PRIVATE HEALTH INSURANCE | Attending: Emergency Medicine | Admitting: Emergency Medicine

## 2010-12-13 ENCOUNTER — Encounter (HOSPITAL_COMMUNITY): Payer: Self-pay | Admitting: Emergency Medicine

## 2010-12-13 DIAGNOSIS — I2510 Atherosclerotic heart disease of native coronary artery without angina pectoris: Secondary | ICD-10-CM | POA: Insufficient documentation

## 2010-12-13 DIAGNOSIS — I10 Essential (primary) hypertension: Secondary | ICD-10-CM | POA: Insufficient documentation

## 2010-12-13 DIAGNOSIS — Z79899 Other long term (current) drug therapy: Secondary | ICD-10-CM | POA: Insufficient documentation

## 2010-12-13 DIAGNOSIS — I252 Old myocardial infarction: Secondary | ICD-10-CM | POA: Insufficient documentation

## 2010-12-13 DIAGNOSIS — R42 Dizziness and giddiness: Secondary | ICD-10-CM | POA: Insufficient documentation

## 2010-12-13 LAB — POCT I-STAT, CHEM 8
Creatinine, Ser: 1.2 mg/dL (ref 0.50–1.35)
Glucose, Bld: 119 mg/dL — ABNORMAL HIGH (ref 70–99)
Hemoglobin: 12.6 g/dL — ABNORMAL LOW (ref 13.0–17.0)
Potassium: 3.4 mEq/L — ABNORMAL LOW (ref 3.5–5.1)

## 2010-12-13 LAB — CARDIAC PANEL(CRET KIN+CKTOT+MB+TROPI)
CK, MB: 2.6 ng/mL (ref 0.3–4.0)
Total CK: 95 U/L (ref 7–232)

## 2010-12-13 NOTE — ED Notes (Signed)
Patient today was sitting down stood up to answer the phone and patient stated his blood pressure was high and was dizzy head to toe.  Stated has high blood pressure and has ran out some of his medications for blood pressure.  Patient ax4 calm cooperative states feels better and denies dizziness.  Airway intact bilateral equal chest rise and fall. Resting comfortably on stretcher watching TV.

## 2010-12-13 NOTE — ED Provider Notes (Signed)
History     CSN: [id number] Arrival date & time: 12/13/2010  4:01 PM   First MD Initiated Contact with Patient 12/13/10 1622      Chief Complaint  Patient presents with  . Dizziness    (Consider location/radiation/quality/duration/timing/severity/associated sxs/prior treatment) Patient is a 65 y.o. male presenting with neurologic complaint. The history is provided by the patient.  Neurologic Problem The primary symptoms include dizziness. Primary symptoms do not include headaches, fever, nausea or vomiting. The symptoms began 2 to 6 hours ago. The episode lasted 2 minutes. The symptoms are resolved. The symptoms occurred after standing up.  Description: "like my blood pressure got high" The dizziness began today. The dizziness has been resolved since its onset. It is a new problem. Associated with: standing up. Dizziness does not occur with blurred vision, tinnitus, nausea, vomiting, weakness or diaphoresis.  Additional symptoms include loss of balance. Additional symptoms do not include weakness, tinnitus or vertigo.    Past Medical History  Diagnosis Date  . Acute MI, inferior wall   . Coronary artery disease     severe 3 vessel   . Hypertension   . History of tobacco abuse   . Urethral stricture due to infective diseases classified elsewhere   . CAD (coronary artery disease)     Past Surgical History  Procedure Date  . Flexible cystoscopy   . Coronary artery bypass graft     times 4  . Repair of post infarction posterior ventricular septal defect     No family history on file.  History  Substance Use Topics  . Smoking status: Former Research scientist (life sciences)  . Smokeless tobacco: Not on file  . Alcohol Use: No      Review of Systems  Unable to perform ROS Constitutional: Negative for fever, chills, diaphoresis and fatigue.  HENT: Negative for tinnitus.   Eyes: Negative for blurred vision.  Respiratory: Negative for cough and shortness of breath.   Cardiovascular: Negative  for chest pain and palpitations.  Gastrointestinal: Negative for nausea and vomiting.  Musculoskeletal: Negative for myalgias and arthralgias.  Skin: Negative for color change and rash.  Neurological: Positive for dizziness and loss of balance. Negative for vertigo, syncope, speech difficulty, weakness, light-headedness, numbness and headaches.  Psychiatric/Behavioral: Negative for confusion and decreased concentration.  All other systems reviewed and are negative.    Allergies  Review of patient's allergies indicates no known allergies.  Home Medications   Current Outpatient Rx  Name Route Sig Dispense Refill  . AMLODIPINE BESYLATE 10 MG PO TABS       . CARVEDILOL 12.5 MG PO TABS Oral Take 12.5 mg by mouth 2 (two) times daily with a meal.      . CLOPIDOGREL BISULFATE 75 MG PO TABS       . LISINOPRIL 10 MG PO TABS Oral Take 10 mg by mouth daily.      Marland Kitchen ROSUVASTATIN CALCIUM 40 MG PO TABS Oral Take 40 mg by mouth daily.        BP 137/99  Pulse 76  Temp(Src) 98.8 F (37.1 C) (Oral)  Resp 19  SpO2 98%  Physical Exam  Nursing note and vitals reviewed. Constitutional: He is oriented to person, place, and time. He appears well-developed and well-nourished.  HENT:  Head: Normocephalic and atraumatic.  Eyes: EOM are normal. Pupils are equal, round, and reactive to light.  Cardiovascular: Normal rate, regular rhythm, normal heart sounds and intact distal pulses.   Pulmonary/Chest: Effort normal and breath sounds normal. No  respiratory distress.  Abdominal: Soft. There is no tenderness.  Neurological: He is alert and oriented to person, place, and time.  Skin: Skin is warm and dry.  Psychiatric: He has a normal mood and affect.    ED Course  Procedures (including critical care time)  Labs Reviewed  POCT I-STAT, CHEM 8 - Abnormal; Notable for the following:    Potassium 3.4 (*)    Glucose, Bld 119 (*)    Hemoglobin 12.6 (*)    HCT 37.0 (*)    All other components within  normal limits  CARDIAC PANEL(CRET KIN+CKTOT+MB+TROPI)   No results found.   Date: 12/13/2010  Rate: 70  Rhythm: normal sinus rhythm  QRS Axis: normal  Intervals: PR prolonged (208 ms)  ST/T Wave abnormalities: nonspecific T wave changes TWI II, III, aVF, V4-V6  Conduction Disutrbances:none  Narrative Interpretation: sinus rhythm, borderline 1st degree AV block, multiple TWI  Old EKG Reviewed: changes noted new TWI V5-V6    1. HYPERTENSION, BENIGN   2. Postural dizziness       MDM  65 yo M presents with episode of "dizziness" today, when he first stood up from a chair to answer the phone. Describes it as feeling "like when my blood pressure gets high," and felt dizzy down to his toes. Says that when his blood pressure gets high, he feels dizzy. Says has been out of 2 of his 5 BP meds for past 4-5 days, and will not be able to afford to buy a refill until Monday (3 days from now). Denies feeling lightheaded, vertiginous, presyncopal, chest pain, SOB, palpitations, N/V, confusion, or any other complaints. Says that he sat down, and very quickly felt better. Felt it once or twice later when standing up, but denying any current symptoms. Exam unremarkable. Will check EKG, orthostatics, BMP, Hgb, and due to length of time since onset of symptoms and past hx, will check trop.  Labs unremarkable. Pt not orthostatic. Discussed with pt taking BP meds when able to afford them, working with PCP to be on meds he can afford regularly, f/u with PCP if symptoms persist, and indications for return. Pt expresses understanding.      Marcelino Scot, MD 12/14/10 0025

## 2010-12-13 NOTE — ED Notes (Signed)
Pt from group home, pt jumped up to answer phone, HR went up and got dizzy.  Pt with hx of anxiety.  bp 180-102 down to 146/98, takes multiple bp meds.  After pt sat for a minute dizziness went away and pt calmed down.  Repeat dizziness with standing.  Staff on scene concerned

## 2010-12-13 NOTE — ED Notes (Signed)
No chest pain at any time

## 2010-12-14 NOTE — ED Provider Notes (Signed)
I saw and evaluated the patient, reviewed the resident's note and I agree with the findings and plan.  The patient arrived with complaints of dizziness.  His exam was unremarkable.  Heart and lungs were clear and neurologically the patient was intact.  The labs, ekg are all unremarkable.  I agree with the resident's interpretation of the exam.    Veryl Speak, MD 12/14/10 [phone]

## 2011-01-20 ENCOUNTER — Encounter: Payer: PRIVATE HEALTH INSURANCE | Admitting: Cardiology

## 2011-01-20 NOTE — Progress Notes (Signed)
   [id number] male previously followed by Dr Haroldine Laws for fu of CAD; had an acute inferior MI in September 2011 due to occlusion of a PL branch. Cath with severe 3-V CAD with TIMI-3 flow in the PL branch. Post MI course c/b acute VSD and taken for emergency CABG x4 with LIMA to the LAD, SVG to the diagonal, SVG to the circumflex marginal, and SVG to the PDA as well as repair of ventricular septal defect in September 2011. Echo November 2011, EF ~35-40% range with akinesis of mid to distal inferior wall, apex and septum. VSD patch stable. Mild MR; severe TR. Also with dilated thoracic aorta (4.3 cm by CT 12-11). Also with lung nodule. Thyroid nodule.  Last seen in June of 2012. Since then,    Current Outpatient Prescriptions  Medication Sig Dispense Refill  . amLODipine (NORVASC) 10 MG tablet        . carvedilol (COREG) 12.5 MG tablet Take 12.5 mg by mouth 2 (two) times daily with a meal.        . clopidogrel (PLAVIX) 75 MG tablet        . lisinopril (PRINIVIL,ZESTRIL) 10 MG tablet Take 10 mg by mouth daily.        . rosuvastatin (CRESTOR) 40 MG tablet Take 40 mg by mouth daily.           Past Medical History  Diagnosis Date  . Acute MI, inferior wall   . Coronary artery disease     severe 3 vessel   . Hypertension   . History of tobacco abuse   . Urethral stricture due to infective diseases classified elsewhere   . CAD (coronary artery disease)     Past Surgical History  Procedure Date  . Flexible cystoscopy   . Coronary artery bypass graft     times 4  . Repair of post infarction posterior ventricular septal defect     History   Social History  . Marital Status: Married    Spouse Name: N/A    Number of Children: N/A  . Years of Education: N/A   Occupational History  . Not on file.   Social History Main Topics  . Smoking status: Former Research scientist (life sciences)  . Smokeless tobacco: Not on file  . Alcohol Use: No  . Drug Use: No  . Sexually Active:    Other Topics Concern  . Not  on file   Social History Narrative  . No narrative on file    ROS: no fevers or chills, productive cough, hemoptysis, dysphasia, odynophagia, melena, hematochezia, dysuria, hematuria, rash, seizure activity, orthopnea, PND, pedal edema, claudication. Remaining systems are negative.  Physical Exam: Well-developed well-nourished in no acute distress.  Skin is warm and dry.  HEENT is normal.  Neck is supple. No thyromegaly.  Chest is clear to auscultation with normal expansion.  Cardiovascular exam is regular rate and rhythm.  Abdominal exam nontender or distended. No masses palpated. Extremities show no edema. neuro grossly intact  ECG     This encounter was created in error - please disregard.

## 2011-02-10 ENCOUNTER — Institutional Professional Consult (permissible substitution): Payer: PRIVATE HEALTH INSURANCE | Admitting: Cardiovascular Disease

## 2011-02-17 ENCOUNTER — Other Ambulatory Visit: Payer: Self-pay | Admitting: Internal Medicine

## 2011-03-05 ENCOUNTER — Encounter (HOSPITAL_COMMUNITY): Payer: Self-pay | Admitting: Emergency Medicine

## 2011-03-05 ENCOUNTER — Emergency Department (HOSPITAL_COMMUNITY)
Admission: EM | Admit: 2011-03-05 | Discharge: 2011-03-05 | Disposition: A | Discharge status: Home / Self Care | Payer: Medicare Other | Attending: Emergency Medicine | Admitting: Emergency Medicine

## 2011-03-05 DIAGNOSIS — J069 Acute upper respiratory infection, unspecified: Secondary | ICD-10-CM | POA: Insufficient documentation

## 2011-03-05 DIAGNOSIS — I2510 Atherosclerotic heart disease of native coronary artery without angina pectoris: Secondary | ICD-10-CM | POA: Diagnosis not present

## 2011-03-05 DIAGNOSIS — I10 Essential (primary) hypertension: Secondary | ICD-10-CM | POA: Diagnosis not present

## 2011-03-05 DIAGNOSIS — J3489 Other specified disorders of nose and nasal sinuses: Secondary | ICD-10-CM | POA: Diagnosis not present

## 2011-03-05 NOTE — ED Notes (Signed)
124/84 manual BP, left arm

## 2011-03-05 NOTE — Discharge Instructions (Signed)
Saline nasal spray and spray into each nostril every 2 hours while awake. Called symptoms should last one or 2 weeks. Return if your condition worsens for any reason or see the [facility] . Call [facility] today to get a primary care Dr.

## 2011-03-05 NOTE — ED Provider Notes (Signed)
History     CSN: [id number]  Arrival date & time 03/05/11  [id number]   First MD Initiated Contact with Patient 03/05/11 1025      Chief Complaint  Patient presents with  . Nasal Congestion    (Consider location/radiation/quality/duration/timing/severity/associated sxs/prior treatment) HPI Complains of nasal congestion clear rhinorrhea cough and sneeze onset 2 days ago. No treatment prior to coming here. Nothing makes symptoms better or worse no pain no other complaint patient believes he has a cold. No other associated symptoms Past Medical History  Diagnosis Date  . Acute MI, inferior wall   . Coronary artery disease     severe 3 vessel   . Hypertension   . History of tobacco abuse   . Urethral stricture due to infective diseases classified elsewhere   . CAD (coronary artery disease)     Past Surgical History  Procedure Date  . Flexible cystoscopy   . Coronary artery bypass graft     times 4  . Repair of post infarction posterior ventricular septal defect     No family history on file.  History  Substance Use Topics  . Smoking status: Former Research scientist (life sciences)  . Smokeless tobacco: Not on file  . Alcohol Use: No      Review of Systems  Constitutional: Negative.   HENT: Positive for congestion and sneezing.   Respiratory: Positive for cough.   Cardiovascular: Negative.   Gastrointestinal: Negative.   Musculoskeletal: Negative.   Skin: Negative.   Neurological: Negative.   Hematological: Negative.   Psychiatric/Behavioral: Negative.   All other systems reviewed and are negative.    Allergies  Review of patient's allergies indicates no known allergies.  Home Medications   Current Outpatient Rx  Name Route Sig Dispense Refill  . AMLODIPINE BESYLATE 10 MG PO TABS       . CARVEDILOL 12.5 MG PO TABS Oral Take 12.5 mg by mouth 2 (two) times daily with a meal.      . CLOPIDOGREL BISULFATE 75 MG PO TABS       . LISINOPRIL 10 MG PO TABS  take 1 tablet by mouth once daily  30 tablet 6  . ROSUVASTATIN CALCIUM 40 MG PO TABS Oral Take 40 mg by mouth daily.        BP 171/107  Pulse 64  Temp(Src) 97.9 F (36.6 C) (Oral)  Resp 16  SpO2 100%  Physical Exam  Nursing note and vitals reviewed. Constitutional: He appears well-developed and well-nourished.  HENT:  Head: Normocephalic and atraumatic.  Right Ear: External ear normal.  Left Ear: External ear normal.  Mouth/Throat: No oropharyngeal exudate.       Tympanic membranes normal bilaterally, nasal congestion  Eyes: Conjunctivae are normal. Pupils are equal, round, and reactive to light. Left eye exhibits no discharge.  Neck: Neck supple. No tracheal deviation present. No thyromegaly present.  Cardiovascular: Normal rate and regular rhythm.   No murmur heard. Pulmonary/Chest: Effort normal and breath sounds normal.  Abdominal: Soft. Bowel sounds are normal. He exhibits no distension. There is no tenderness.  Musculoskeletal: Normal range of motion. He exhibits no edema and no tenderness.  Lymphadenopathy:    He has no cervical adenopathy.  Neurological: He is alert. Coordination normal.  Skin: Skin is warm and dry. No rash noted.  Psychiatric: He has a normal mood and affect.    ED Course  Procedures (including critical care time)  Labs Reviewed - No data to display No results found.   No diagnosis found.  MDM  Plan discharge to home Expected management Saline nasal spray Referral [facility] Diagnosis upper respiratory infection        Orlie Dakin, MD 03/05/11 1050

## 2011-03-05 NOTE — ED Notes (Signed)
Per pt, states he has been congested of a few days, productive cough, states fever last night

## 2011-03-25 ENCOUNTER — Encounter: Payer: Self-pay | Admitting: Cardiology

## 2011-03-25 ENCOUNTER — Encounter: Payer: Self-pay | Admitting: Cardiovascular Disease

## 2011-04-16 ENCOUNTER — Other Ambulatory Visit (HOSPITAL_COMMUNITY): Payer: Self-pay | Admitting: Internal Medicine

## 2011-05-15 ENCOUNTER — Other Ambulatory Visit (HOSPITAL_COMMUNITY): Payer: Self-pay | Admitting: Internal Medicine

## 2011-05-15 NOTE — Telephone Encounter (Signed)
..   Requested Prescriptions   Pending Prescriptions Disp Refills  . amLODipine (NORVASC) 10 MG tablet [Pharmacy Med Name: AMLODIPINE BESYLATE 10 MG TAB] 30 tablet 2    Sig: take 1 tablet by mouth once daily  . clopidogrel (PLAVIX) 75 MG tablet [Pharmacy Med Name: CLOPIDOGREL 75 MG TABLET] 30 tablet 2    Sig: take 1 tablet by mouth once daily

## 2011-06-16 ENCOUNTER — Encounter (HOSPITAL_COMMUNITY): Payer: Self-pay | Admitting: Emergency Medicine

## 2011-06-16 ENCOUNTER — Emergency Department (HOSPITAL_COMMUNITY)
Admission: EM | Admit: 2011-06-16 | Discharge: 2011-06-16 | Disposition: A | Discharge status: Home / Self Care | Payer: Medicare Other | Attending: Emergency Medicine | Admitting: Emergency Medicine

## 2011-06-16 DIAGNOSIS — M545 Low back pain, unspecified: Secondary | ICD-10-CM | POA: Diagnosis not present

## 2011-06-16 DIAGNOSIS — I252 Old myocardial infarction: Secondary | ICD-10-CM | POA: Insufficient documentation

## 2011-06-16 DIAGNOSIS — I2510 Atherosclerotic heart disease of native coronary artery without angina pectoris: Secondary | ICD-10-CM | POA: Insufficient documentation

## 2011-06-16 DIAGNOSIS — Z87891 Personal history of nicotine dependence: Secondary | ICD-10-CM | POA: Insufficient documentation

## 2011-06-16 DIAGNOSIS — I10 Essential (primary) hypertension: Secondary | ICD-10-CM | POA: Diagnosis not present

## 2011-06-16 MED ORDER — HYDROCODONE-ACETAMINOPHEN 5-500 MG PO TABS: 1.0000 | ORAL_TABLET | Freq: Four times a day (QID) | ORAL | Status: AC | PRN
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
## 2011-06-16 NOTE — ED Provider Notes (Signed)
Medical screening examination/treatment/procedure(s) were performed by non-physician practitioner and as supervising physician I was immediately available for consultation/collaboration.   Hoy Morn, MD 06/16/11 [phone]

## 2011-06-25 ENCOUNTER — Encounter (HOSPITAL_COMMUNITY): Payer: Self-pay | Admitting: Emergency Medicine

## 2011-06-25 ENCOUNTER — Emergency Department (HOSPITAL_COMMUNITY)
Admission: EM | Admit: 2011-06-25 | Discharge: 2011-06-25 | Disposition: A | Discharge status: Home / Self Care | Payer: Medicare Other | Attending: Emergency Medicine | Admitting: Emergency Medicine

## 2011-06-25 ENCOUNTER — Emergency Department (HOSPITAL_COMMUNITY): Payer: Medicare Other

## 2011-06-25 DIAGNOSIS — Y93I9 Activity, other involving external motion: Secondary | ICD-10-CM | POA: Insufficient documentation

## 2011-06-25 DIAGNOSIS — Z951 Presence of aortocoronary bypass graft: Secondary | ICD-10-CM | POA: Diagnosis not present

## 2011-06-25 DIAGNOSIS — Z87891 Personal history of nicotine dependence: Secondary | ICD-10-CM | POA: Insufficient documentation

## 2011-06-25 DIAGNOSIS — S335XXA Sprain of ligaments of lumbar spine, initial encounter: Secondary | ICD-10-CM | POA: Insufficient documentation

## 2011-06-25 DIAGNOSIS — E669 Obesity, unspecified: Secondary | ICD-10-CM | POA: Diagnosis not present

## 2011-06-25 DIAGNOSIS — Y998 Other external cause status: Secondary | ICD-10-CM | POA: Insufficient documentation

## 2011-06-25 DIAGNOSIS — I252 Old myocardial infarction: Secondary | ICD-10-CM | POA: Insufficient documentation

## 2011-06-25 DIAGNOSIS — I10 Essential (primary) hypertension: Secondary | ICD-10-CM | POA: Insufficient documentation

## 2011-06-25 DIAGNOSIS — M47817 Spondylosis without myelopathy or radiculopathy, lumbosacral region: Secondary | ICD-10-CM | POA: Diagnosis not present

## 2011-06-25 DIAGNOSIS — Z79899 Other long term (current) drug therapy: Secondary | ICD-10-CM | POA: Insufficient documentation

## 2011-06-25 DIAGNOSIS — I2510 Atherosclerotic heart disease of native coronary artery without angina pectoris: Secondary | ICD-10-CM | POA: Diagnosis not present

## 2011-06-25 DIAGNOSIS — S39012A Strain of muscle, fascia and tendon of lower back, initial encounter: Secondary | ICD-10-CM

## 2011-06-25 MED ORDER — OXYCODONE-ACETAMINOPHEN 5-325 MG PO TABS: 1.0000 | ORAL_TABLET | ORAL | Status: AC | PRN
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
## 2011-06-25 NOTE — Discharge Instructions (Signed)
Try heating pads on your back, percocet for severe pain. Back stretches and exercises. Follow up with your doctor as soon as able.   Back Exercises Back exercises help treat and prevent back injuries. The goal is to increase your strength in your belly (abdominal) and back muscles. These exercises can also help with flexibility. Start these exercises when told by your doctor. HOME CARE Back exercises include: Pelvic Tilt.  Lie on your back with your knees bent. Tilt your pelvis until the lower part of your back is against the floor. Hold this position 5 to 10 sec. Repeat this exercise 5 to 10 times.  Knee to Chest.  Pull 1 knee up against your chest and hold for 20 to 30 seconds. Repeat this with the other knee. This may be done with the other leg straight or bent, whichever feels better. Then, pull both knees up against your chest.  Sit-Ups or Curl-Ups.  Bend your knees 90 degrees. Start with tilting your pelvis, and do a partial, slow sit-up. Only lift your upper half 30 to 45 degrees off the floor. Take at least 2 to 3 seonds for each sit-up. Do not do sit-ups with your knees out straight. If partial sit-ups are difficult, simply do the above but with only tightening your belly (abdominal) muscles and holding it as told.  Hip-Lift.  Lie on your back with your knees flexed 90 degrees. Push down with your feet and shoulders as you raise your hips 2 inches off the floor. Hold for 10 seconds, repeat 5 to 10 times.  Back Arches.  Lie on your stomach. Prop yourself up on bent elbows. Slowly press on your hands, causing an arch in your low back. Repeat 3 to 5 times.  Shoulder-Lifts.  Lie face down with arms beside your body. Keep hips and belly pressed to floor as you slowly lift your head and shoulders off the floor.  Do not overdo your exercises. Be careful in the beginning. Exercises may cause you some mild back discomfort. If the pain lasts for more than 15 minutes, stop the exercises until  you see your doctor. Improvement with exercise for back problems is slow.  Document Released: 02/01/2010 Document Revised: 12/19/2010 Document Reviewed: 02/01/2010 [facility] Patient Information 2012 [place].
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
## 2011-06-25 NOTE — ED Provider Notes (Signed)
Medical screening examination/treatment/procedure(s) were conducted as a shared visit with non-physician practitioner(s) and myself.  I personally evaluated the patient during the encounter   Patient has subacute injury to the right lower back that is slowly improving. He was given hydrocodone and feels that it was not strong enough. In the pain is only mildly limiting his ambulation. He is using a cane to ambulate. He has not tried non-narcotic pain medicines. He saw his PCP today to be evaluated for blood pressure. On exam, he has mild, right lumbar tenderness with fair range of motion of the lumbar spine.    Richarda Blade, MD 06/27/11 1003

## 2011-07-02 ENCOUNTER — Encounter (HOSPITAL_COMMUNITY): Payer: Self-pay | Admitting: Emergency Medicine

## 2011-07-02 ENCOUNTER — Emergency Department (INDEPENDENT_AMBULATORY_CARE_PROVIDER_SITE_OTHER)
Admission: EM | Admit: 2011-07-02 | Discharge: 2011-07-02 | Disposition: A | Discharge status: Home / Self Care | Payer: Medicare Other | Source: Home / Self Care | Attending: Emergency Medicine | Admitting: Emergency Medicine

## 2011-07-02 DIAGNOSIS — S335XXA Sprain of ligaments of lumbar spine, initial encounter: Secondary | ICD-10-CM | POA: Diagnosis not present

## 2011-07-02 DIAGNOSIS — S39012A Strain of muscle, fascia and tendon of lower back, initial encounter: Secondary | ICD-10-CM

## 2011-07-02 MED ORDER — METHOCARBAMOL 500 MG PO TABS: 500.0000 mg | ORAL_TABLET | Freq: Three times a day (TID) | ORAL | Status: AC

## 2011-07-02 MED ORDER — OXYCODONE-ACETAMINOPHEN 5-325 MG PO TABS: ORAL_TABLET | ORAL | Status: AC
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
## 2011-07-02 NOTE — Discharge Instructions (Signed)
Back Exercises Back exercises help treat and prevent back injuries. The goal of back exercises is to increase the strength of your abdominal and back muscles and the flexibility of your back. These exercises should be started when you no longer have back pain. Back exercises include:  Pelvic Tilt. Lie on your back with your knees bent. Tilt your pelvis until the lower part of your back is against the floor. Hold this position 5 to 10 sec and repeat 5 to 10 times.   Knee to Chest. Pull first 1 knee up against your chest and hold for 20 to 30 seconds, repeat this with the other knee, and then both knees. This may be done with the other leg straight or bent, whichever feels better.   Sit-Ups or Curl-Ups. Bend your knees 90 degrees. Start with tilting your pelvis, and do a partial, slow sit-up, lifting your trunk only 30 to 45 degrees off the floor. Take at least 2 to 3 seconds for each sit-up. Do not do sit-ups with your knees out straight. If partial sit-ups are difficult, simply do the above but with only tightening your abdominal muscles and holding it as directed.   Hip-Lift. Lie on your back with your knees flexed 90 degrees. Push down with your feet and shoulders as you raise your hips a couple inches off the floor; hold for 10 seconds, repeat 5 to 10 times.   Back arches. Lie on your stomach, propping yourself up on bent elbows. Slowly press on your hands, causing an arch in your low back. Repeat 3 to 5 times. Any initial stiffness and discomfort should lessen with repetition over time.   Shoulder-Lifts. Lie face down with arms beside your body. Keep hips and torso pressed to floor as you slowly lift your head and shoulders off the floor.  Do not overdo your exercises, especially in the beginning. Exercises may cause you some mild back discomfort which lasts for a few minutes; however, if the pain is more severe, or lasts for more than 15 minutes, do not continue exercises until you see your  caregiver. Improvement with exercise therapy for back problems is slow.  See your caregivers for assistance with developing a proper back exercise program. Document Released: 02/07/2004 Document Revised: 12/19/2010 Document Reviewed: 12/30/2004 [facility] Patient Information 2012 [place].

## 2011-07-07 DIAGNOSIS — M546 Pain in thoracic spine: Secondary | ICD-10-CM | POA: Diagnosis not present

## 2011-07-10 ENCOUNTER — Ambulatory Visit: Payer: Medicare Other | Admitting: Physical Therapy

## 2011-07-18 ENCOUNTER — Other Ambulatory Visit (HOSPITAL_COMMUNITY): Payer: Self-pay | Admitting: Internal Medicine

## 2011-07-18 NOTE — Telephone Encounter (Signed)
Pt needs appointment then refill can be made Fax Received. Refill Completed. [name] (R.M.A)   

## 2011-07-24 ENCOUNTER — Ambulatory Visit
Payer: PRIVATE HEALTH INSURANCE | Attending: Family Medicine | Admitting: Rehabilitative and Restorative Service Providers"

## 2011-07-24 DIAGNOSIS — M545 Low back pain, unspecified: Secondary | ICD-10-CM | POA: Insufficient documentation

## 2011-07-24 DIAGNOSIS — IMO0001 Reserved for inherently not codable concepts without codable children: Secondary | ICD-10-CM | POA: Insufficient documentation

## 2011-07-29 ENCOUNTER — Ambulatory Visit: Payer: PRIVATE HEALTH INSURANCE | Admitting: Rehabilitation

## 2011-07-31 ENCOUNTER — Ambulatory Visit: Payer: PRIVATE HEALTH INSURANCE | Admitting: Rehabilitation

## 2011-08-05 ENCOUNTER — Ambulatory Visit: Payer: PRIVATE HEALTH INSURANCE | Admitting: Rehabilitative and Restorative Service Providers"

## 2011-08-07 ENCOUNTER — Encounter: Payer: Self-pay | Admitting: Rehabilitative and Restorative Service Providers"

## 2011-08-12 ENCOUNTER — Ambulatory Visit: Payer: PRIVATE HEALTH INSURANCE | Admitting: Rehabilitative and Restorative Service Providers"

## 2011-08-13 ENCOUNTER — Other Ambulatory Visit: Payer: Self-pay

## 2011-08-13 ENCOUNTER — Other Ambulatory Visit (HOSPITAL_COMMUNITY): Payer: Self-pay | Admitting: Internal Medicine

## 2011-08-13 ENCOUNTER — Ambulatory Visit: Payer: PRIVATE HEALTH INSURANCE | Admitting: Rehabilitation

## 2011-08-13 MED ORDER — CLOPIDOGREL BISULFATE 75 MG PO TABS: 75.0000 mg | ORAL_TABLET | Freq: Every day | ORAL | Status: DC

## 2011-08-13 MED ORDER — AMLODIPINE BESYLATE 10 MG PO TABS: 10.0000 mg | ORAL_TABLET | Freq: Every day | ORAL | Status: DC

## 2011-08-14 ENCOUNTER — Encounter: Payer: Self-pay | Admitting: Rehabilitative and Restorative Service Providers"

## 2011-09-16 ENCOUNTER — Other Ambulatory Visit: Payer: Self-pay | Admitting: Internal Medicine

## 2011-10-17 ENCOUNTER — Other Ambulatory Visit: Payer: Self-pay | Admitting: Internal Medicine

## 2011-12-14 ENCOUNTER — Other Ambulatory Visit: Payer: Self-pay | Admitting: Internal Medicine

## 2012-01-14 ENCOUNTER — Other Ambulatory Visit: Payer: Self-pay | Admitting: Internal Medicine

## 2012-01-16 ENCOUNTER — Other Ambulatory Visit: Payer: Self-pay | Admitting: Internal Medicine

## 2012-01-16 NOTE — Addendum Note (Signed)
Addended by: Kathreen Cornfield on: 01/16/2012 01:19 PM   Modules accepted: Orders

## 2012-01-20 ENCOUNTER — Telehealth: Payer: Self-pay | Admitting: Cardiology

## 2012-01-20 MED ORDER — CARVEDILOL 12.5 MG PO TABS: ORAL_TABLET | ORAL | Status: DC

## 2012-01-20 MED ORDER — AMLODIPINE BESYLATE 10 MG PO TABS: ORAL_TABLET | ORAL | Status: DC

## 2012-01-20 MED ORDER — LISINOPRIL 10 MG PO TABS: ORAL_TABLET | ORAL | Status: DC

## 2012-01-20 MED ORDER — CLOPIDOGREL BISULFATE 75 MG PO TABS: ORAL_TABLET | ORAL | Status: DC

## 2012-01-20 MED ORDER — ROSUVASTATIN CALCIUM 40 MG PO TABS: ORAL_TABLET | ORAL | Status: DC

## 2012-01-20 NOTE — Telephone Encounter (Signed)
New problem:   Refills on all medication.

## 2012-02-13 ENCOUNTER — Encounter: Payer: Self-pay | Admitting: Cardiology

## 2012-02-17 ENCOUNTER — Other Ambulatory Visit: Payer: Self-pay | Admitting: *Deleted

## 2012-02-17 ENCOUNTER — Ambulatory Visit: Payer: PRIVATE HEALTH INSURANCE | Admitting: Cardiology

## 2012-02-17 MED ORDER — LISINOPRIL 10 MG PO TABS: ORAL_TABLET | ORAL | Status: DC

## 2012-02-17 MED ORDER — CARVEDILOL 12.5 MG PO TABS: ORAL_TABLET | ORAL | Status: DC

## 2012-02-17 MED ORDER — ROSUVASTATIN CALCIUM 40 MG PO TABS: ORAL_TABLET | ORAL | Status: DC

## 2012-02-17 MED ORDER — AMLODIPINE BESYLATE 10 MG PO TABS: ORAL_TABLET | ORAL | Status: DC

## 2012-02-17 MED ORDER — CLOPIDOGREL BISULFATE 75 MG PO TABS: ORAL_TABLET | ORAL | Status: DC

## 2012-02-18 ENCOUNTER — Encounter: Payer: PRIVATE HEALTH INSURANCE | Admitting: Cardiology

## 2012-02-18 ENCOUNTER — Encounter: Payer: Self-pay | Admitting: Cardiology

## 2012-02-18 NOTE — Progress Notes (Signed)
   HPI: Pleasant male previously followed by Dr Haroldine Laws for fu of CAD. Patient experienced an acute inferior MI in September 2011 due to occlusion of a PL branch. Cath with severe 3-V CAD with TIMI-3 flow in the PL branch. Post MI course c/b acute VSD. Patient had emergency CABG x4 with LIMA to the LAD, SVG to the diagonal, SVG to the circumflex marginal, and SVG to the PDA as well as repair of ventricular septal defect in September 2011. Echo November 2011, EF ~35-40% range with akinesis of mid to distal inferior wall, apex and septum. VSD patch stable. Mild MR. Moderate to severe TR. Abd CT in August of 2012 showed no AAA. Patient last seen in June of 2012. Since then,    Current Outpatient Prescriptions  Medication Sig Dispense Refill  . amLODipine (NORVASC) 10 MG tablet take 1 tablet by mouth once daily  30 tablet  6  . carvedilol (COREG) 12.5 MG tablet take 1 tablet by mouth twice a day with meals  60 tablet  6  . clopidogrel (PLAVIX) 75 MG tablet take 1 tablet by mouth once daily  30 tablet  6  . lisinopril (PRINIVIL,ZESTRIL) 10 MG tablet take 1 tablet by mouth once daily  30 tablet  6  . rosuvastatin (CRESTOR) 40 MG tablet take 1 tablet by mouth once daily  30 tablet  6     Past Medical History  Diagnosis Date  . Acute MI, inferior wall   . Coronary artery disease     severe 3 vessel   . Hypertension   . History of tobacco abuse   . Urethral stricture due to infective diseases classified elsewhere     Past Surgical History  Procedure Date  . Flexible cystoscopy   . Coronary artery bypass graft     times 4  . Repair of post infarction posterior ventricular septal defect     History   Social History  . Marital Status: Married    Spouse Name: N/A    Number of Children: N/A  . Years of Education: N/A   Occupational History  . Not on file.   Social History Main Topics  . Smoking status: Former Research scientist (life sciences)  . Smokeless tobacco: Not on file  . Alcohol Use: No  . Drug Use:  No  . Sexually Active:    Other Topics Concern  . Not on file   Social History Narrative  . No narrative on file    ROS: no fevers or chills, productive cough, hemoptysis, dysphasia, odynophagia, melena, hematochezia, dysuria, hematuria, rash, seizure activity, orthopnea, PND, pedal edema, claudication. Remaining systems are negative.  Physical Exam: Well-developed well-nourished in no acute distress.  Skin is warm and dry.  HEENT is normal.  Neck is supple.  Chest is clear to auscultation with normal expansion.  Cardiovascular exam is regular rate and rhythm.  Abdominal exam nontender or distended. No masses palpated. Extremities show no edema. neuro grossly intact  ECG     This encounter was created in error - please disregard.

## 2012-02-19 ENCOUNTER — Encounter: Payer: Self-pay | Admitting: Cardiology

## 2012-02-19 ENCOUNTER — Other Ambulatory Visit: Payer: Self-pay | Admitting: *Deleted

## 2012-02-19 ENCOUNTER — Telehealth: Payer: Self-pay | Admitting: *Deleted

## 2012-02-19 ENCOUNTER — Encounter: Payer: Self-pay | Admitting: *Deleted

## 2012-02-19 ENCOUNTER — Ambulatory Visit (INDEPENDENT_AMBULATORY_CARE_PROVIDER_SITE_OTHER): Payer: Medicare Other | Admitting: Cardiology

## 2012-02-19 VITALS — BP 138/84 | HR 72 | Wt 200.0 lb

## 2012-02-19 DIAGNOSIS — E785 Hyperlipidemia, unspecified: Secondary | ICD-10-CM | POA: Insufficient documentation

## 2012-02-19 DIAGNOSIS — I255 Ischemic cardiomyopathy: Secondary | ICD-10-CM | POA: Insufficient documentation

## 2012-02-19 DIAGNOSIS — I10 Essential (primary) hypertension: Secondary | ICD-10-CM

## 2012-02-19 DIAGNOSIS — I2589 Other forms of chronic ischemic heart disease: Secondary | ICD-10-CM | POA: Diagnosis not present

## 2012-02-19 DIAGNOSIS — I2510 Atherosclerotic heart disease of native coronary artery without angina pectoris: Secondary | ICD-10-CM | POA: Diagnosis not present

## 2012-02-19 DIAGNOSIS — E876 Hypokalemia: Secondary | ICD-10-CM

## 2012-02-19 LAB — BASIC METABOLIC PANEL
BUN: 17 mg/dL (ref 6–23)
CO2: 28 mEq/L (ref 19–32)
Chloride: 103 mEq/L (ref 96–112)
Creatinine, Ser: 1.4 mg/dL (ref 0.4–1.5)

## 2012-02-19 LAB — LIPID PANEL
Cholesterol: 76 mg/dL (ref 0–200)
LDL Cholesterol: 34 mg/dL (ref 0–99)
Total CHOL/HDL Ratio: 3

## 2012-02-19 LAB — HEPATIC FUNCTION PANEL
Alkaline Phosphatase: 73 U/L (ref 39–117)
Bilirubin, Direct: 0 mg/dL (ref 0.0–0.3)
Total Protein: 8 g/dL (ref 6.0–8.3)

## 2012-02-19 MED ORDER — CARVEDILOL 12.5 MG PO TABS: 12.5000 mg | ORAL_TABLET | Freq: Two times a day (BID) | ORAL | Status: DC

## 2012-02-19 NOTE — Assessment & Plan Note (Signed)
Continue aspirin and statin. Discontinue Plavix. 

## 2012-02-19 NOTE — Assessment & Plan Note (Signed)
Continue statin. Check lipids and liver. 

## 2012-02-19 NOTE — Telephone Encounter (Signed)
Pt seen today and uncertain about his Meds. Changes made and forwarded to dr Stanford Breed for his review.

## 2012-02-19 NOTE — Assessment & Plan Note (Signed)
Patient not clear about his medications. He will contact us and we will review. Check potassium and renal function.

## 2012-02-19 NOTE — Assessment & Plan Note (Signed)
Continue ACE inhibitor and beta blocker. 

## 2012-02-19 NOTE — Assessment & Plan Note (Signed)
Previous ventricular septal defect repair. Also with reduced LV function. Repeat echocardiogram.

## 2012-02-19 NOTE — Patient Instructions (Addendum)
Your physician wants you to follow-up in: [place] will receive a reminder letter in the mail two months in advance. If you don't receive a letter, please call our office to schedule the follow-up appointment.   Your physician recommends that you HAVE LAB [place] WITH ALL MEDICINE  Your physician has requested that you have an echocardiogram. Echocardiography is a painless test that uses sound waves to create images of your heart. It provides your doctor with information about the size and shape of your heart and how well your heart's chambers and valves are working. This procedure takes approximately one hour. There are no restrictions for this procedure.

## 2012-02-19 NOTE — Telephone Encounter (Signed)
Change coreg to 12.5 mg po BID Kirk Ruths

## 2012-02-19 NOTE — Telephone Encounter (Signed)
Spoke with pt, Aware of dr crenshaw's recommendations.  °

## 2012-02-19 NOTE — Progress Notes (Signed)
   HPI: Pleasant male previously followed by Dr Haroldine Laws for fu of CAD. Patient had an acute inferior MI in September 2011 due to occlusion of a PL branch. Cath with severe 3-V CAD with TIMI-3 flow in the PL branch. Post MI course c/b acute VSD and taken for emergency CABG x4 with LIMA to the LAD, SVG to the diagonal, SVG to the circumflex marginal, and SVG to the PDA as well as repair of ventricular septal defect in September 2011. Echo November 2011, EF ~35-40% range with akinesis of mid to distal inferior wall, apex and septum. VSD patch stable. Mild MR. Moderate to severe TR. Mild biatrial enlargement. Patient last seen in June of 2012. Since then, the patient denies any dyspnea on exertion, orthopnea, PND, pedal edema, palpitations, syncope or chest pain.    Current Outpatient Prescriptions  Medication Sig Dispense Refill  . amLODipine (NORVASC) 10 MG tablet take 1 tablet by mouth once daily  30 tablet  6  . carvedilol (COREG) 12.5 MG tablet take 1 tablet by mouth twice a day with meals  60 tablet  6  . clopidogrel (PLAVIX) 75 MG tablet take 1 tablet by mouth once daily  30 tablet  6  . lisinopril (PRINIVIL,ZESTRIL) 10 MG tablet take 1 tablet by mouth once daily  30 tablet  6  . rosuvastatin (CRESTOR) 40 MG tablet take 1 tablet by mouth once daily  30 tablet  6     Past Medical History  Diagnosis Date  . Acute MI, inferior wall   . Coronary artery disease     severe 3 vessel   . Hypertension   . History of tobacco abuse   . Urethral stricture due to infective diseases classified elsewhere     Past Surgical History  Procedure Date  . Flexible cystoscopy   . Coronary artery bypass graft     times 4  . Repair of post infarction posterior ventricular septal defect     History   Social History  . Marital Status: Married    Spouse Name: N/A    Number of Children: N/A  . Years of Education: N/A   Occupational History  . Not on file.   Social History Main Topics  . Smoking  status: Former Research scientist (life sciences)  . Smokeless tobacco: Not on file  . Alcohol Use: No  . Drug Use: No  . Sexually Active:    Other Topics Concern  . Not on file   Social History Narrative  . No narrative on file    ROS: no fevers or chills, productive cough, hemoptysis, dysphasia, odynophagia, melena, hematochezia, dysuria, hematuria, rash, seizure activity, orthopnea, PND, pedal edema, claudication. Remaining systems are negative.  Physical Exam: Well-developed well-nourished in no acute distress.  Skin is warm and dry.  HEENT is normal.  Neck is supple.  Chest is clear to auscultation with normal expansion.  Cardiovascular exam is regular rate and rhythm.  Abdominal exam nontender or distended. No masses palpated. Extremities show no edema. neuro grossly intact  ECG sinus rhythm at a rate of 72. Prior inferior infarct. Inferior lateral T-wave inversion.

## 2012-03-02 ENCOUNTER — Other Ambulatory Visit (HOSPITAL_COMMUNITY): Payer: Self-pay

## 2012-03-13 DIAGNOSIS — Z9289 Personal history of other medical treatment: Secondary | ICD-10-CM

## 2012-03-13 HISTORY — DX: Personal history of other medical treatment: Z92.89

## 2012-03-23 ENCOUNTER — Ambulatory Visit (HOSPITAL_COMMUNITY): Payer: Medicare Other | Attending: Cardiology

## 2012-03-23 DIAGNOSIS — I10 Essential (primary) hypertension: Secondary | ICD-10-CM | POA: Diagnosis not present

## 2012-03-23 DIAGNOSIS — I2510 Atherosclerotic heart disease of native coronary artery without angina pectoris: Secondary | ICD-10-CM | POA: Diagnosis not present

## 2012-03-23 NOTE — Progress Notes (Signed)
Echocardiogram performed.  

## 2012-03-26 ENCOUNTER — Telehealth: Payer: Self-pay | Admitting: Cardiology

## 2012-03-26 NOTE — Telephone Encounter (Signed)
Spoke with pt, aware of echo results. 

## 2012-03-26 NOTE — Telephone Encounter (Signed)
Pt calling res test results from scan

## 2012-04-12 IMAGING — CR DG CHEST 1V PORT
1 series · 1 of 1 positions shown · non-contrast
Comparison: 10/08/2009 at [DATE] hours

CLINICAL DATA: Code STEMI.  CABG.

PORTABLE CHEST - 1 VIEW

[view not recorded]
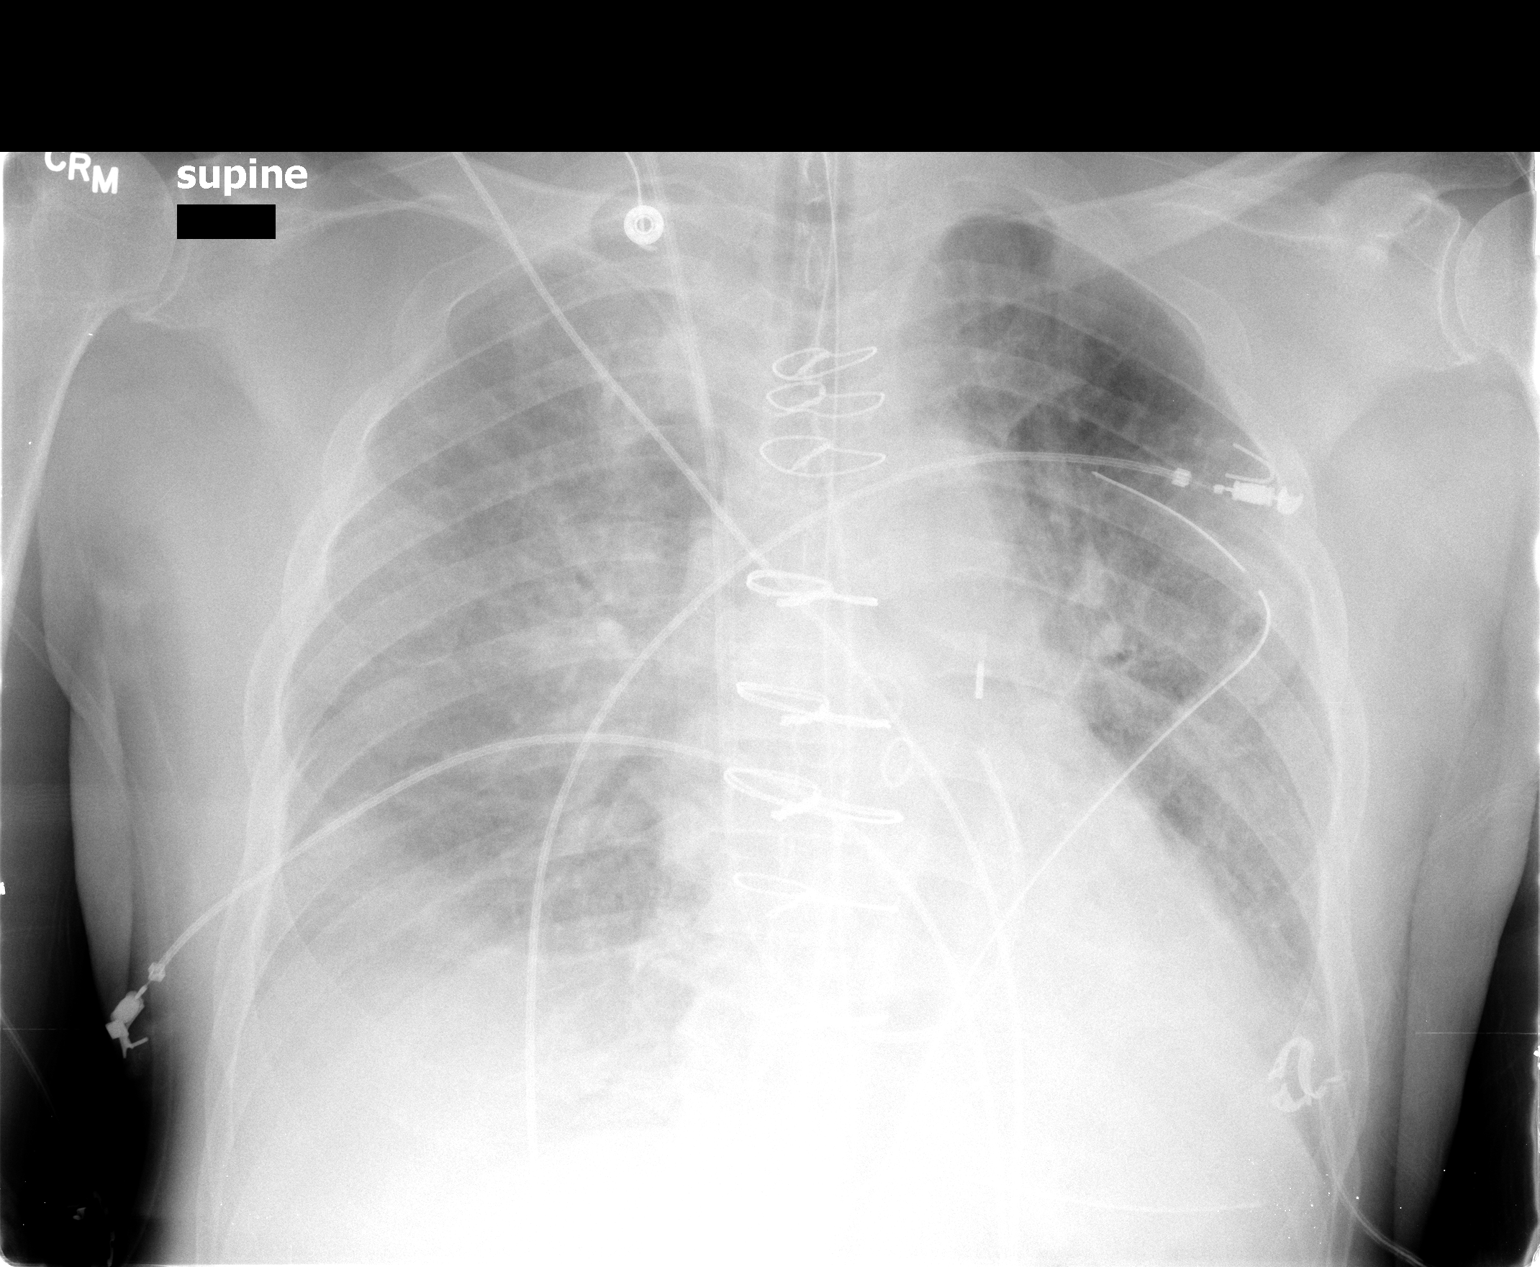

[1 of 1 positions shown; findings below may reference images not displayed]

FINDINGS: Current radiograph at 4558 hours.

There are changes of median sternotomy for CABG.  Endotracheal tube
is present and partially obscured by median sternotomy wires and
overlying tubes.  Endotracheal tube tip is estimated to be
approximately 4 cm above the carina and below the level of the
clavicular heads.  A nasogastric tube terminates in the expected
location of the gastroesophageal junction.

Right IJ sheath transmits a Swan-Ganz catheter with the tip in the
pulmonary outflow tract.  Two left-sided chest tubes, and many
mediastinal drain are noted.

Heart size is stable.  Pulmonary edema pattern is similar to
slightly decreased compared to earlier today.  There are bilateral
pleural effusions, posteriorly layering.  No pneumothorax is
visualized.
IMPRESSION: .
1.  Pulmonary edema with bilateral pleural effusions.
2.  Support devices as described above.  It is noted that the
nasogastric tube terminates near the gastroesophageal junction.

## 2012-04-12 IMAGING — CR DG CHEST 2V
2 series · 2 of 2 positions shown · non-contrast
Comparison: None.

CLINICAL DATA: Short of breath.  Weakness.

CHEST - 2 VIEW

[w chest lat]
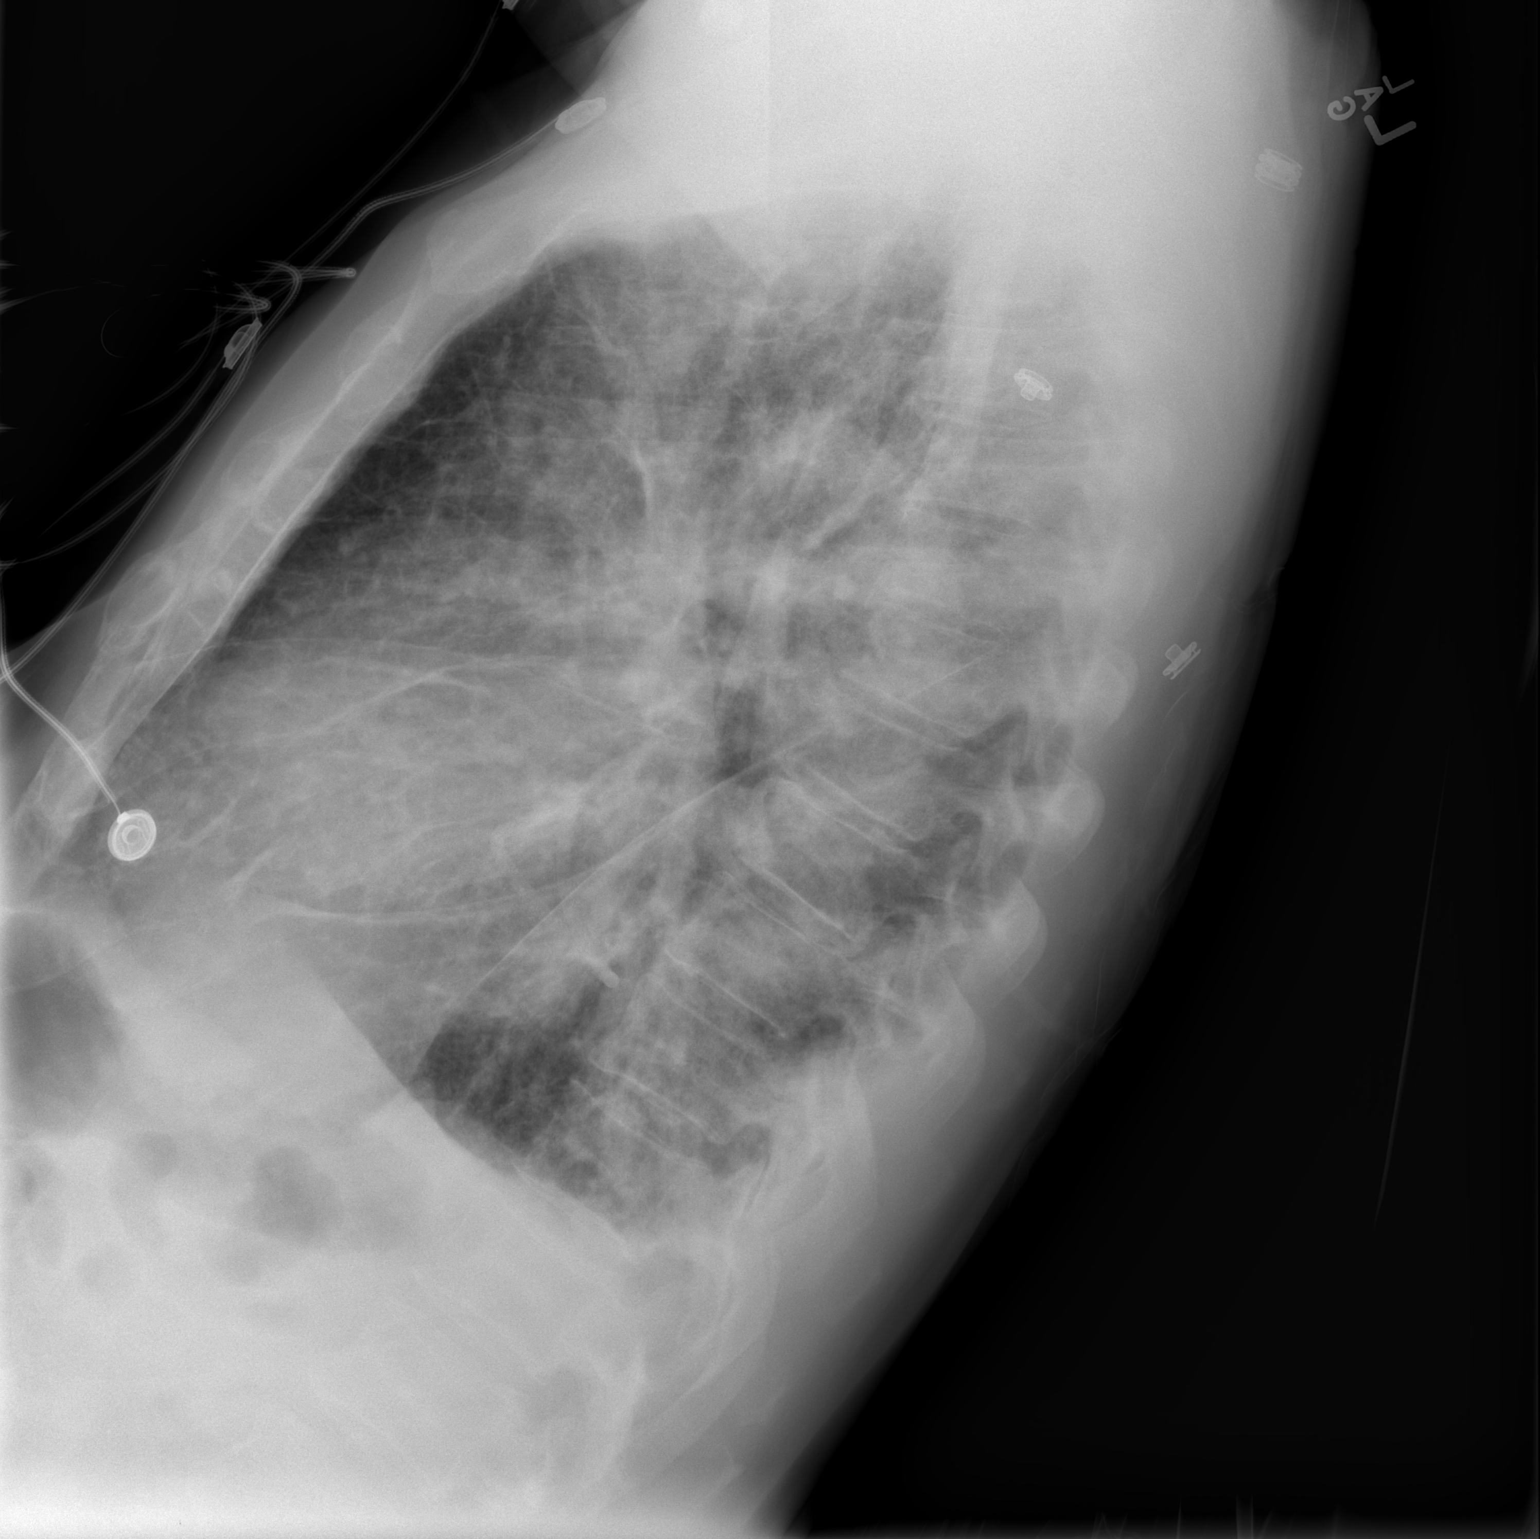

[view not recorded]
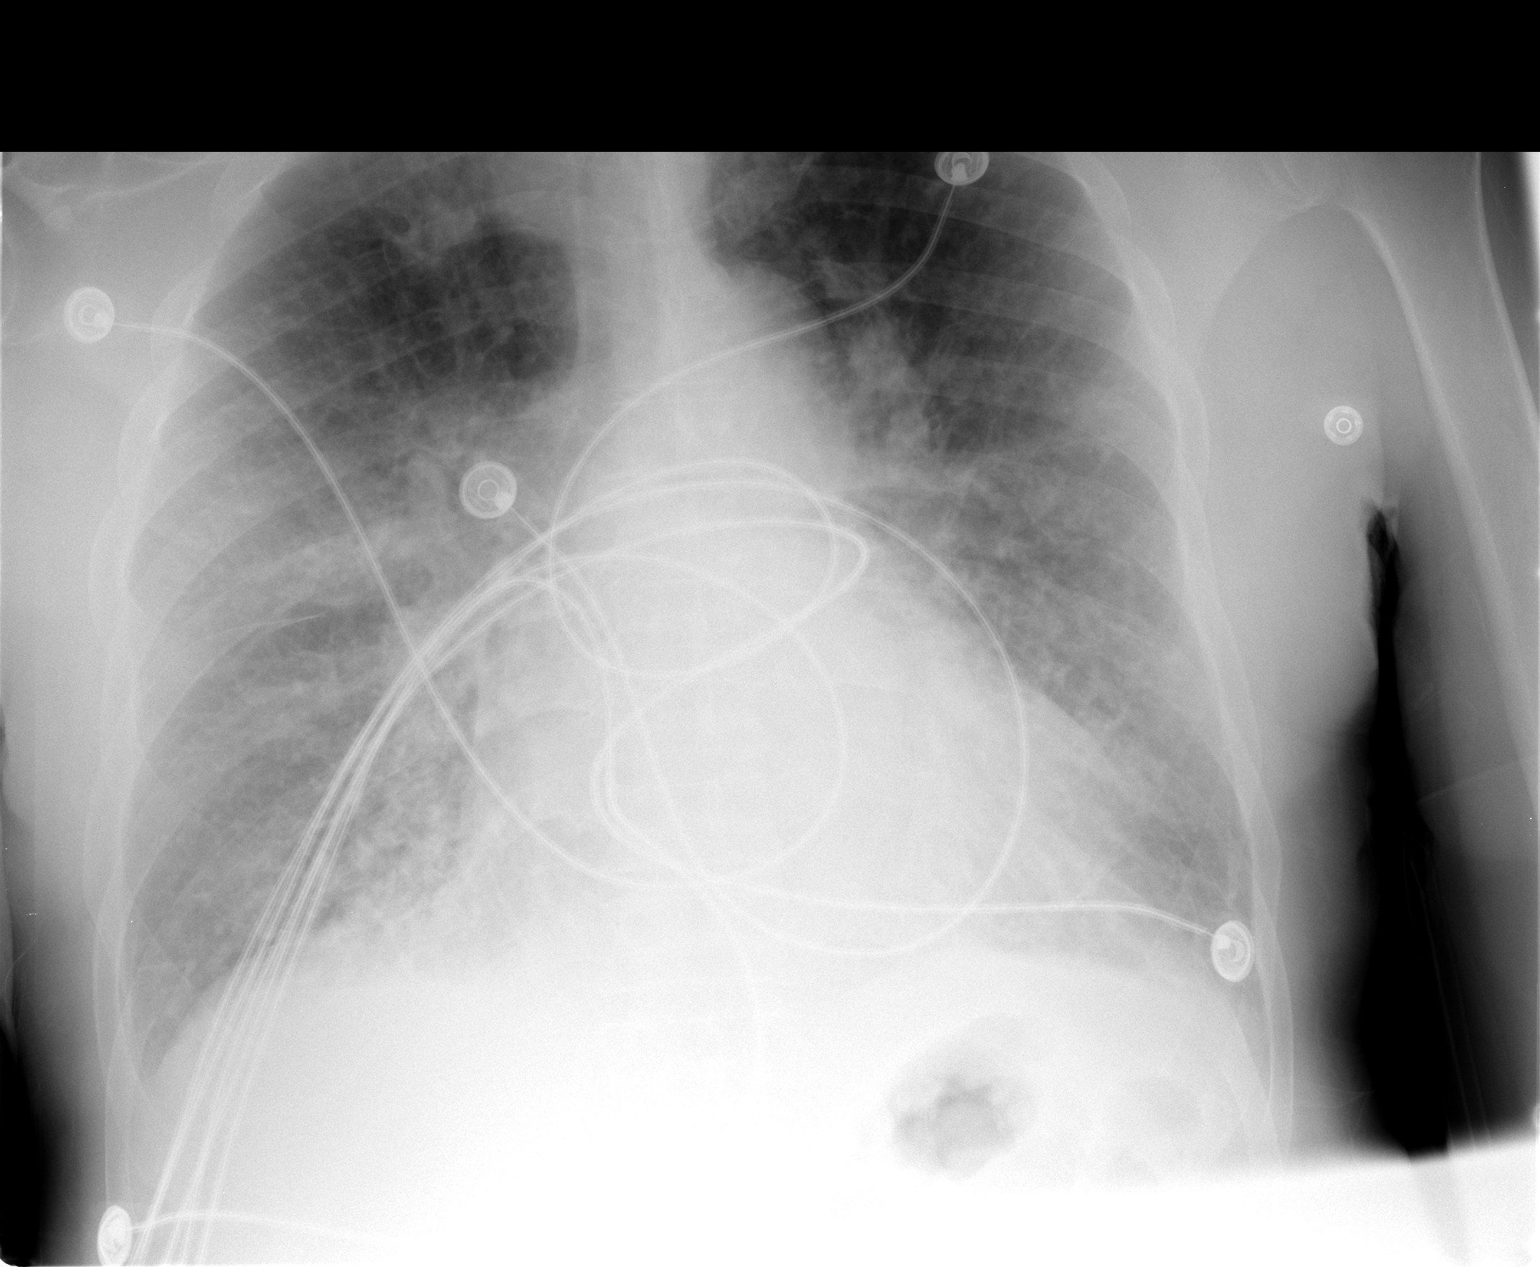

[2 of 2 positions shown; findings below may reference images not displayed]

FINDINGS: Cardiomegaly is present.  There is hilar and basilar
predominant airspace disease most consistent with pulmonary edema
and moderate congestive heart failure.  Small bilateral pleural
effusions are present.  Emphysema.  Interlobular septal thickening
is present.  Monitoring leads are projected over the chest.
Airspace disease shows relative sparing of the upper lobes.
Widening of vascular pedicle.
IMPRESSION: 1.  Cardiomegaly and perihilar and basilar predominant airspace
disease most compatible with CHF.  Multifocal pneumonia less
likely.
2.  And this edema.

## 2012-04-12 IMAGING — CR DG CHEST 1V PORT
1 series · 1 of 1 positions shown · non-contrast
Comparison: 10/08/2009

CLINICAL DATA: Cardiac cath.  Short of breath

PORTABLE CHEST - 1 VIEW

[AP]
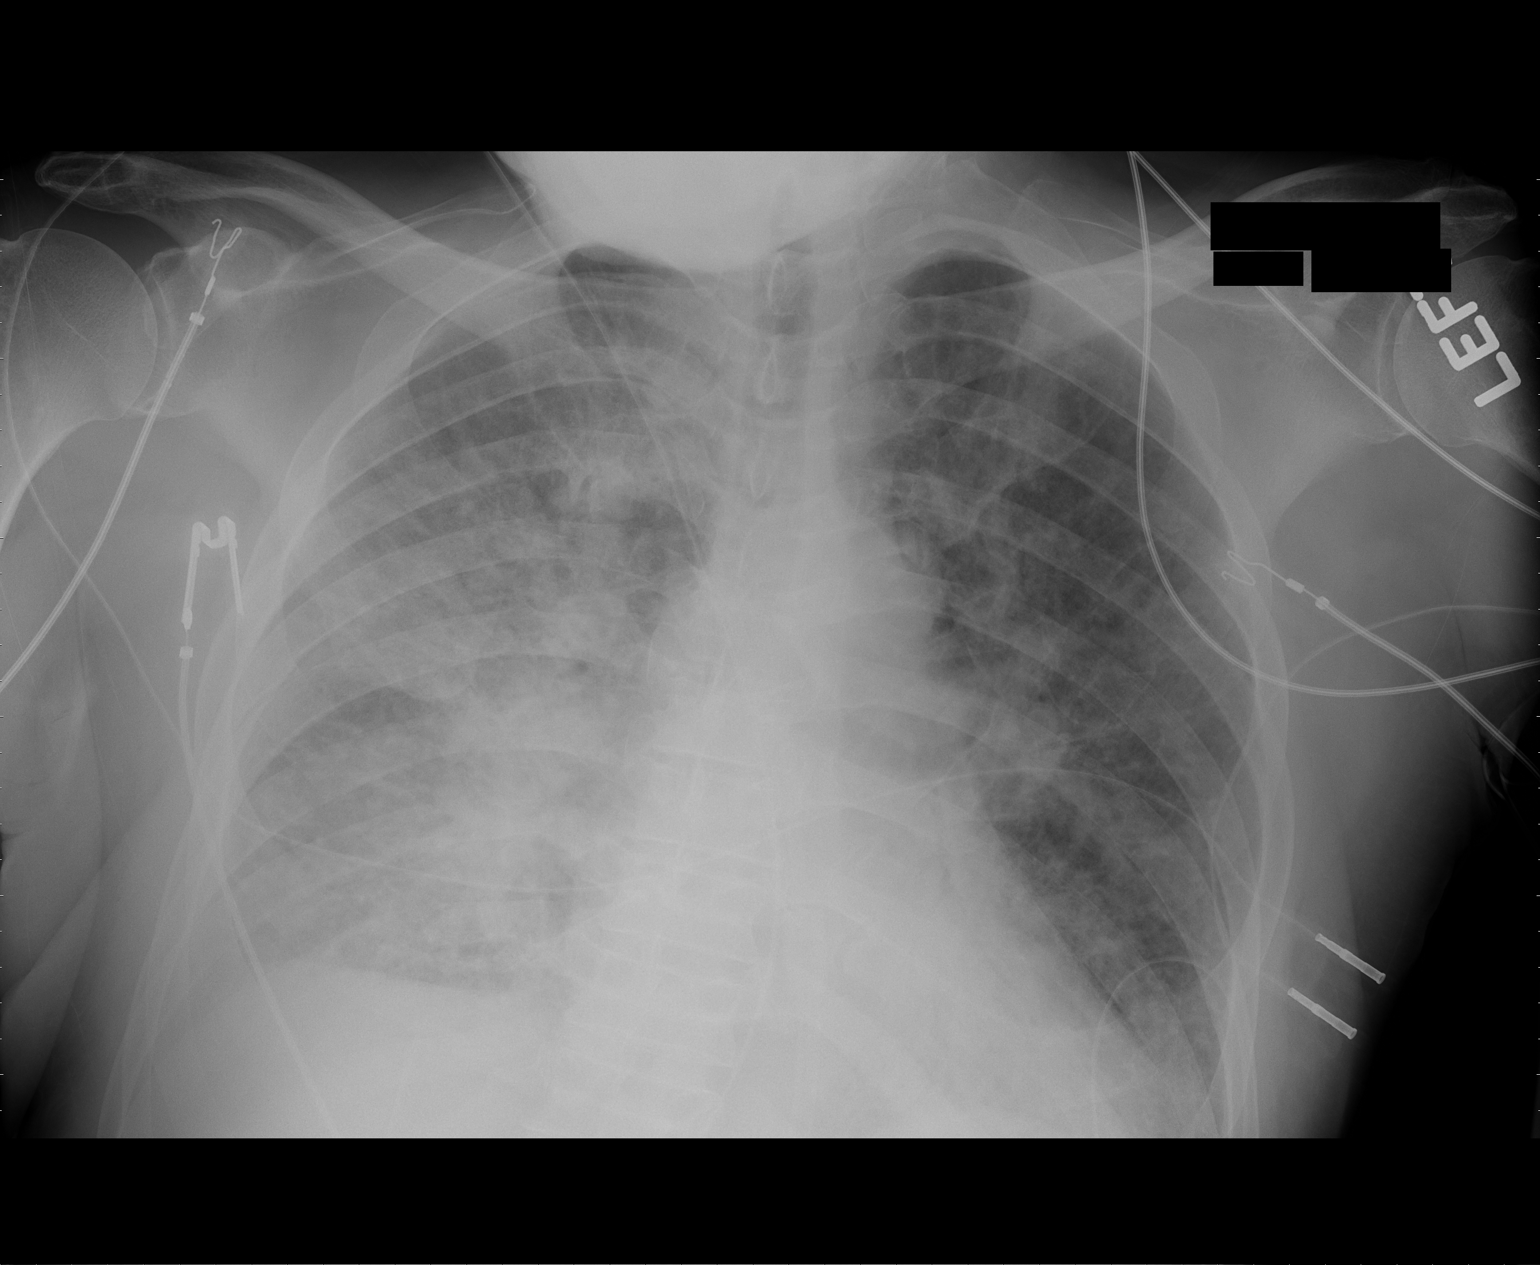

[1 of 1 positions shown; findings below may reference images not displayed]

FINDINGS: Progression of bilateral airspace disease, right greater
than left.  This is most compatible with pulmonary edema.  Small
right pleural effusion.
IMPRESSION: Progression of bilateral airspace disease, right greater than left.
Findings are most compatible with pulmonary edema.

## 2012-04-13 IMAGING — CR DG CHEST 1V PORT
2 series · 2 of 2 positions shown · non-contrast
Comparison: 10/08/2009.

CLINICAL DATA: Postop CABG.

PORTABLE CHEST - 1 VIEW

[view not recorded (1 of 2)]
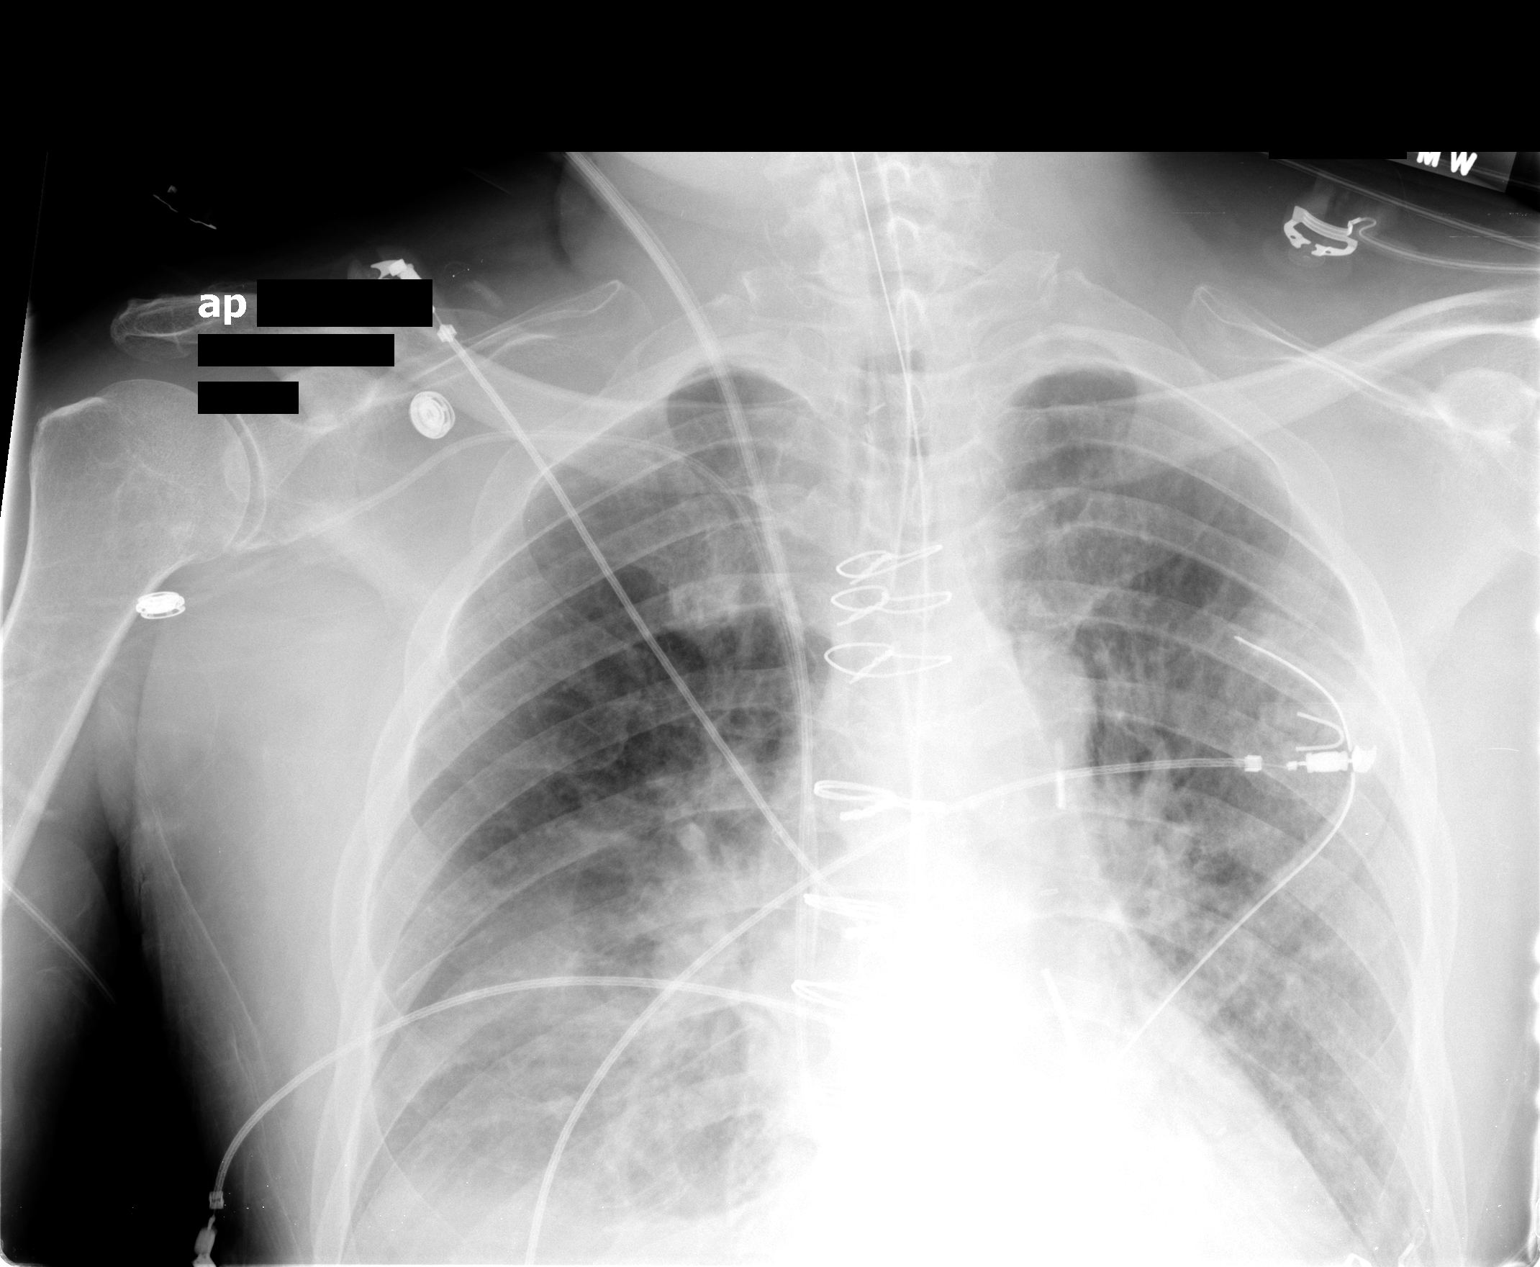

[view not recorded (2 of 2)]
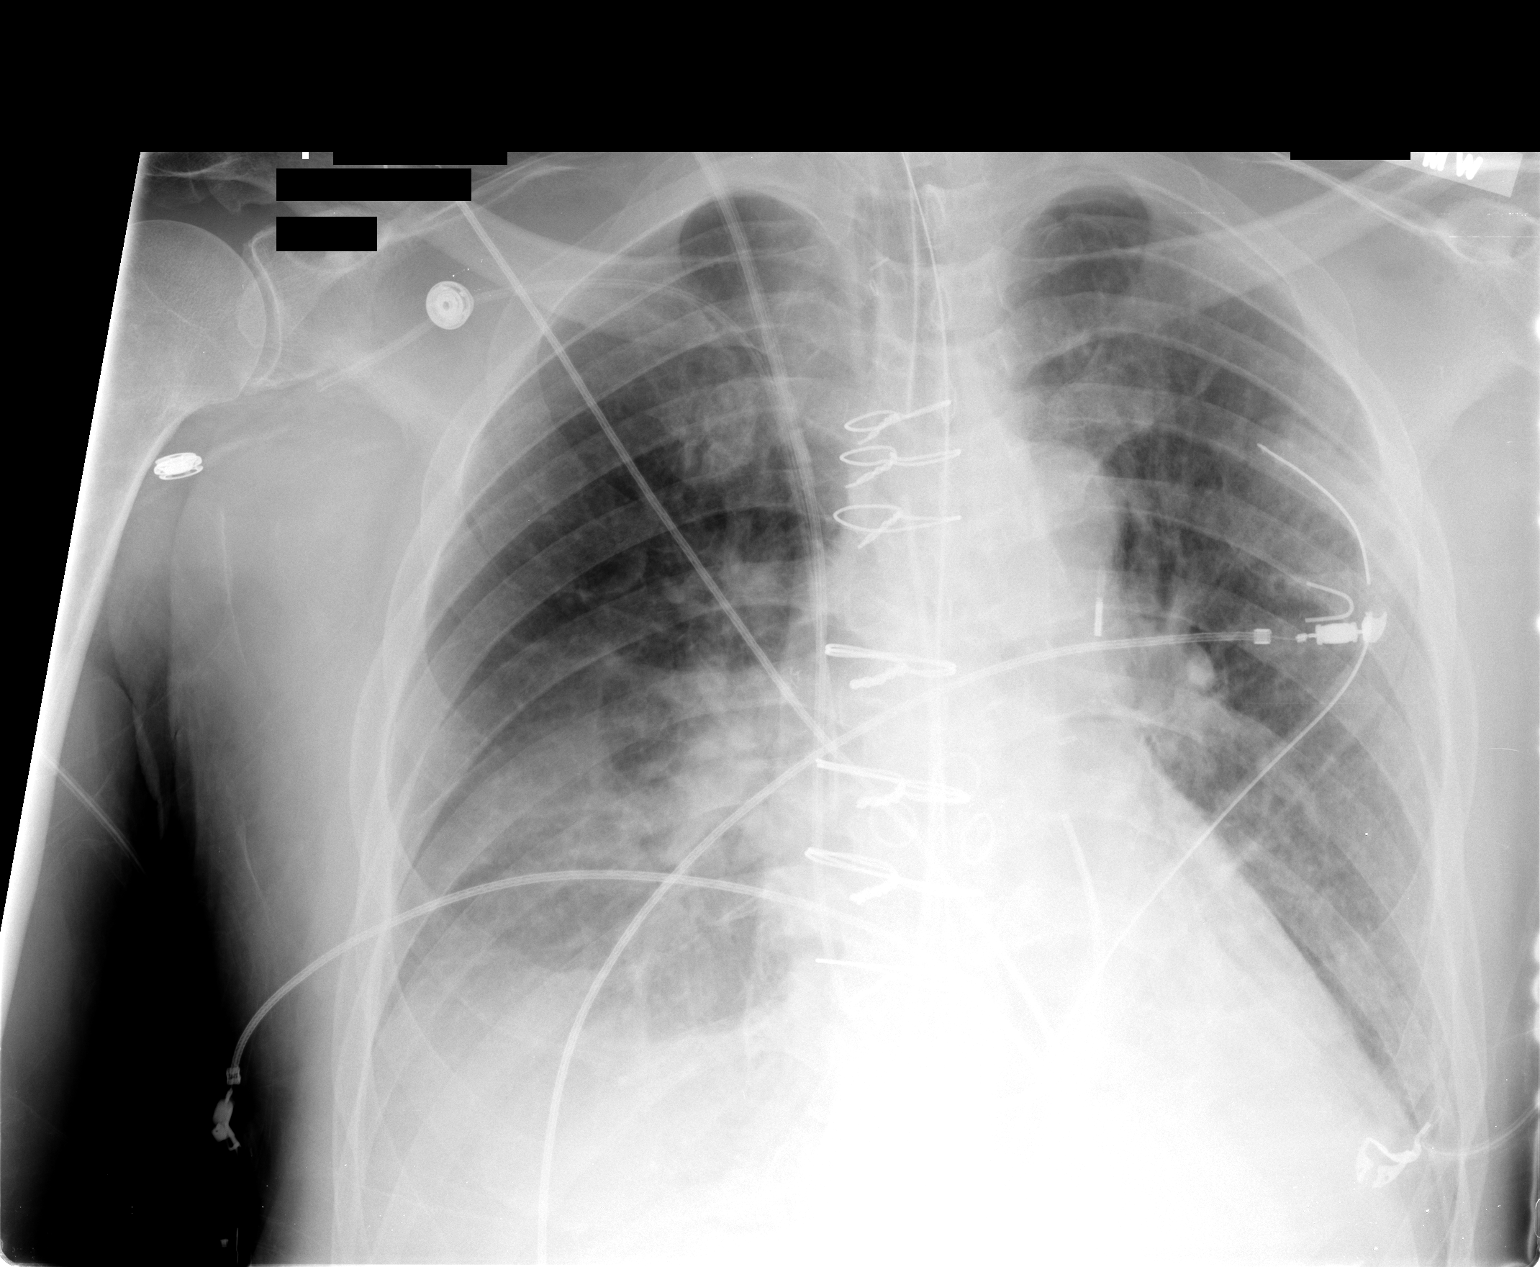

[2 of 2 positions shown; findings below may reference images not displayed]

FINDINGS: 6066 hours.  Examination was repeated to include the lung
bases.  The endotracheal tube, nasogastric tube and chest tubes are
stable in position.  Swan-Ganz catheter tip is in the right
ventricular outflow tract.  The marker for the intra-aortic balloon
pump is slightly more superior, approximately 4.2 cm inferior to
the top of the aortic arch.

Pulmonary edema has slightly improved.  There are persistent
bilateral pleural effusions.  No pneumothorax is demonstrated.  The
heart size and mediastinal contours are stable.
IMPRESSION: 1.  Support system positioning as above.
2.  Mildly improved edema.  No pneumothorax.

## 2012-04-14 IMAGING — CR DG CHEST 1V PORT
1 series · 1 of 1 positions shown · non-contrast
Comparison: 10/09/2009

CLINICAL DATA: CABG.

PORTABLE CHEST - 1 VIEW

[AP]
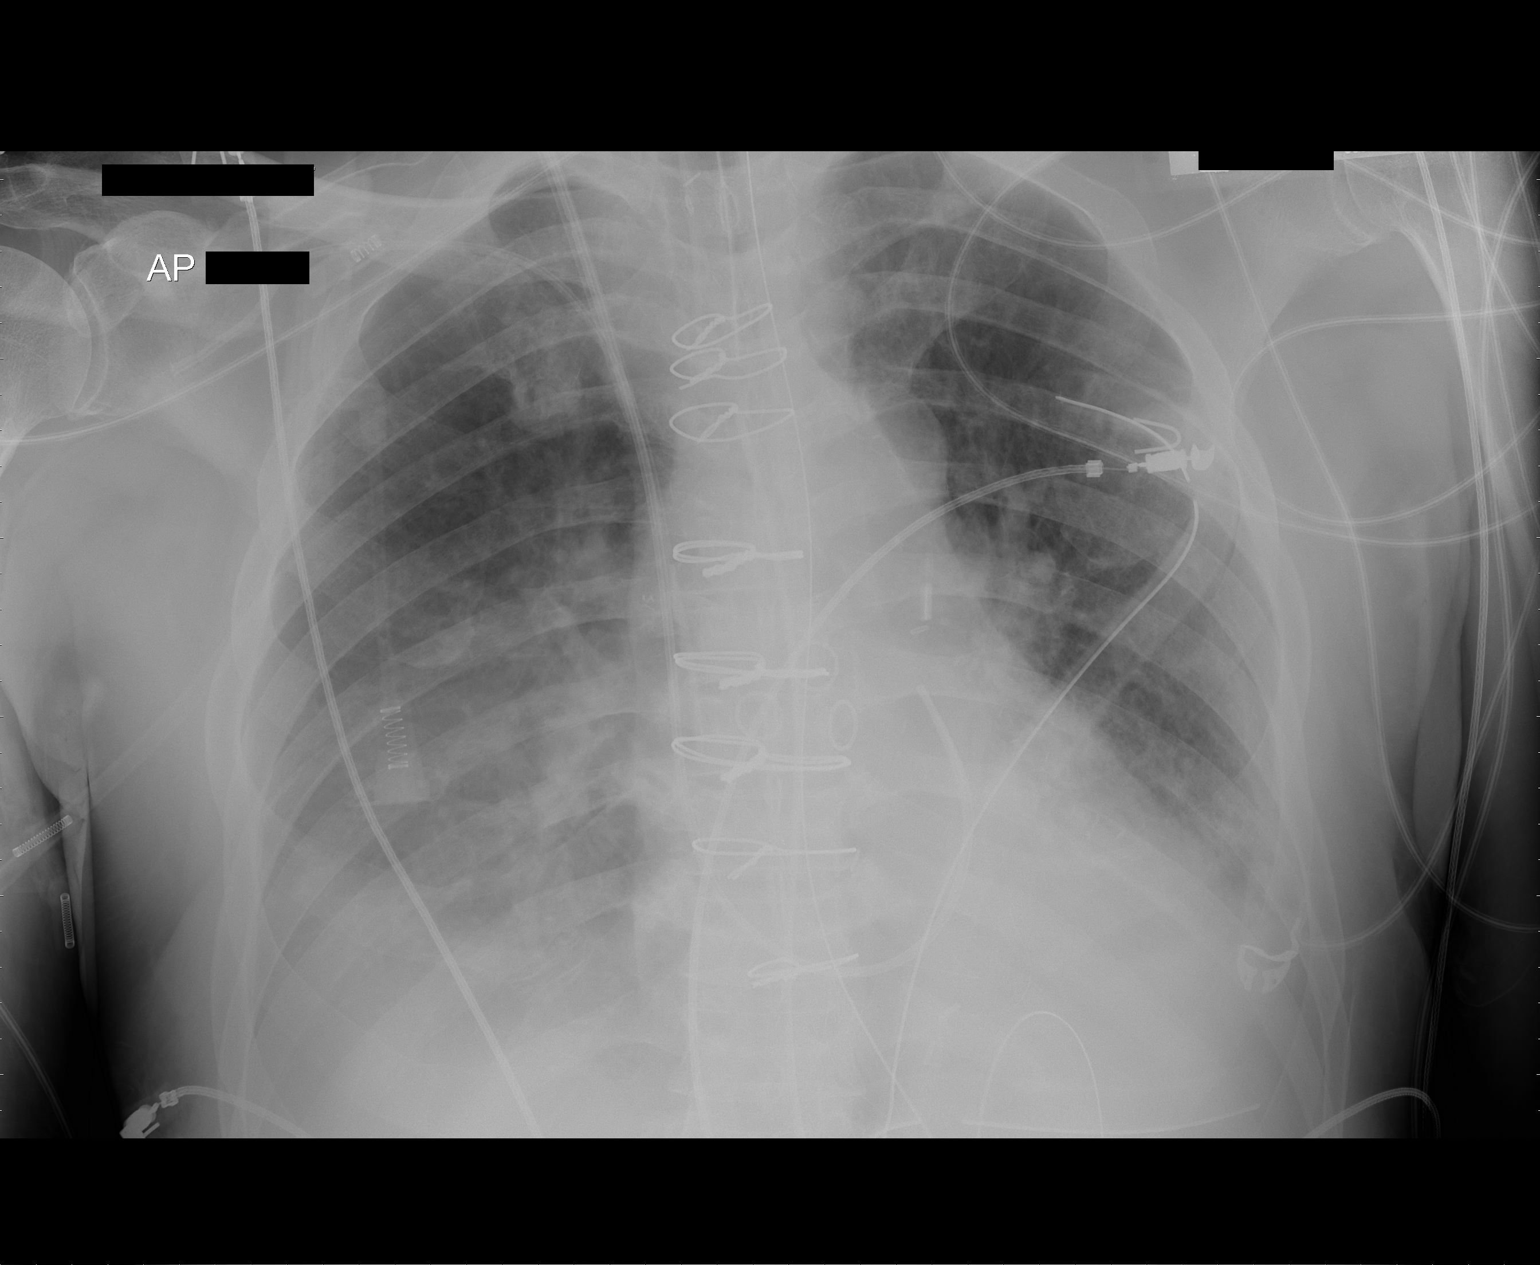

[1 of 1 positions shown; findings below may reference images not displayed]

FINDINGS: Support devices are in stable position including intra-
aortic balloon pump.  Bilateral airspace disease compatible with
pulmonary edema.  There are small bilateral pleural effusions.
Mild cardiomegaly, stable.
IMPRESSION: No significant change since prior study.

## 2012-04-15 IMAGING — CR DG CHEST 1V PORT
1 series · 1 of 1 positions shown · non-contrast
Comparison: 10/10/2009

CLINICAL DATA: CABG

PORTABLE CHEST - 1 VIEW

[AP]
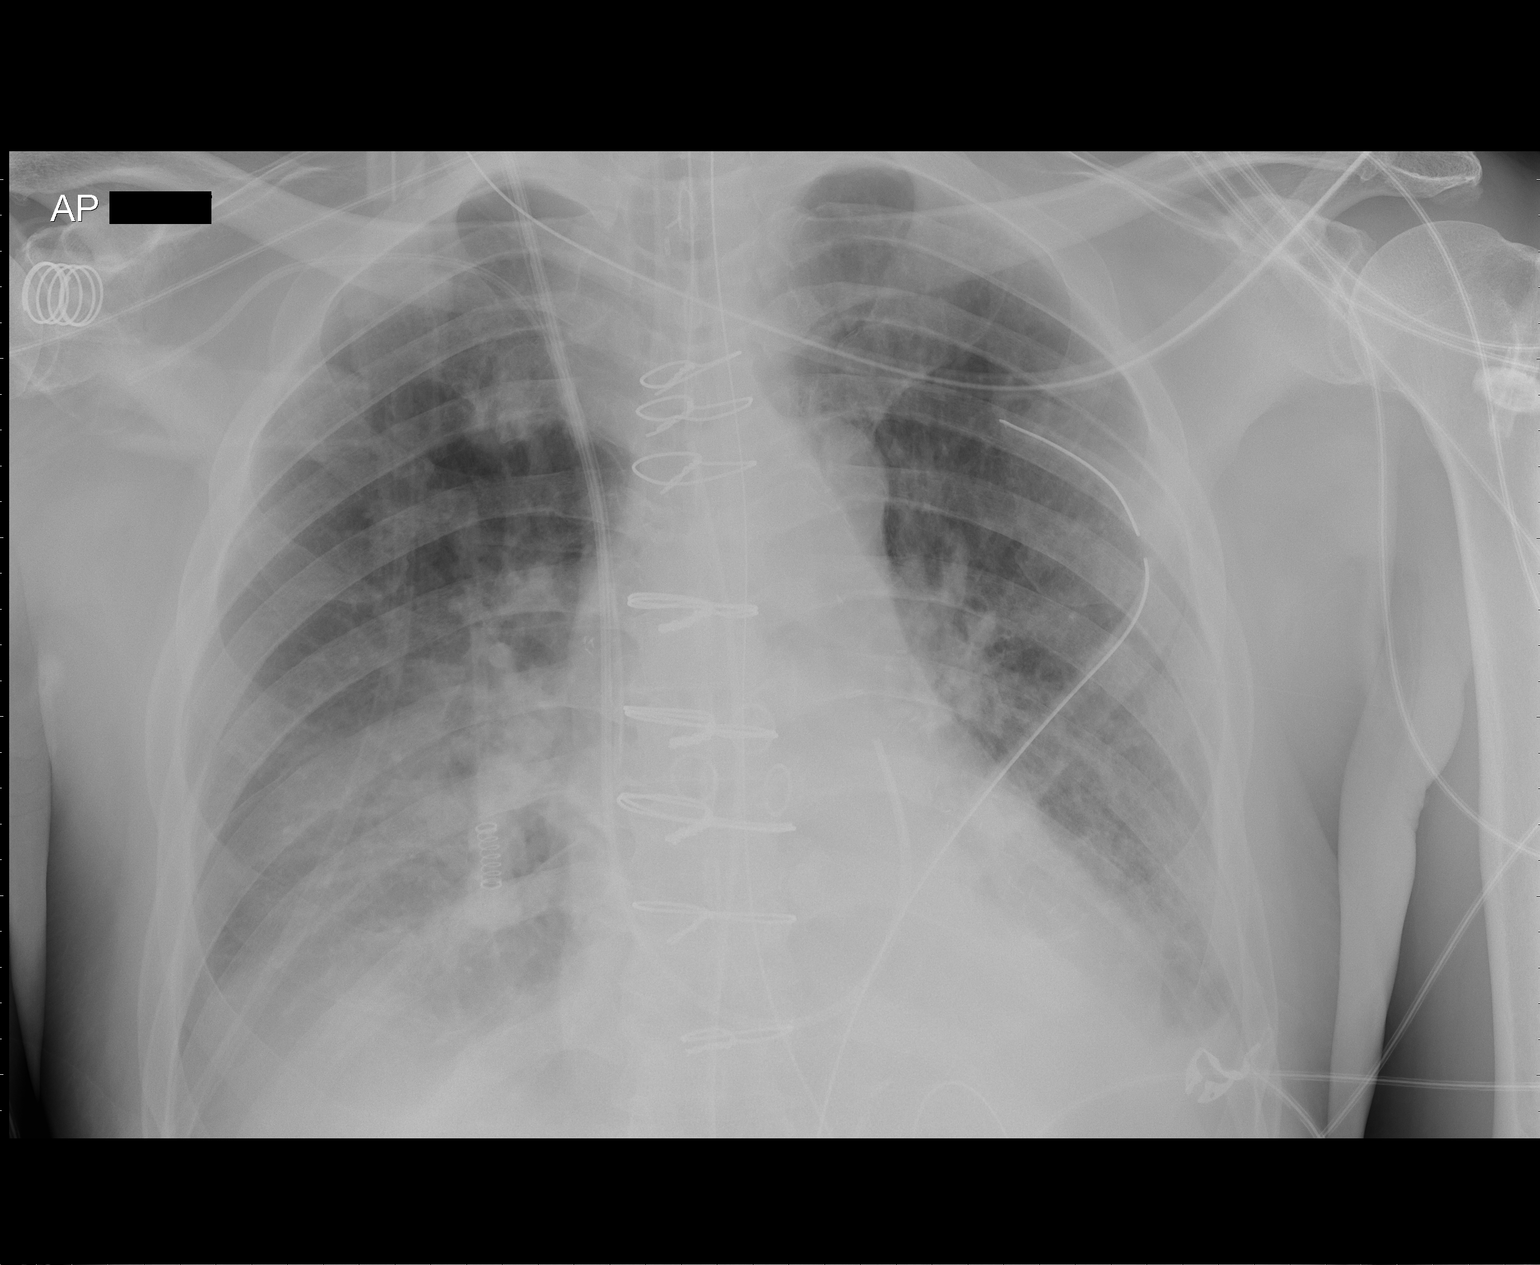

[1 of 1 positions shown; findings below may reference images not displayed]

FINDINGS: Endotracheal tube remains in good position.  Swan-Ganz
catheter is in the main pulmonary artery.  Right subclavian central
venous catheter tip is in the SVC.  Intra aortic balloon pump has
been removed.

Left chest tube is in place without pneumothorax.

Bilateral airspace disease is unchanged and is primarily in the
bases.  This may be due to edema or atelectasis.  There are small
pleural effusions.
IMPRESSION: No significant change bibasilar airspace disease.

## 2012-05-10 IMAGING — CR DG CHEST 2V
2 series · 2 of 2 positions shown · non-contrast
Comparison: Portable chest x-ray of 10/13/2009

CLINICAL DATA: Status post CABG on 10/08/2009, some shortness of
breath

CHEST - 2 VIEW

[w chest pa]
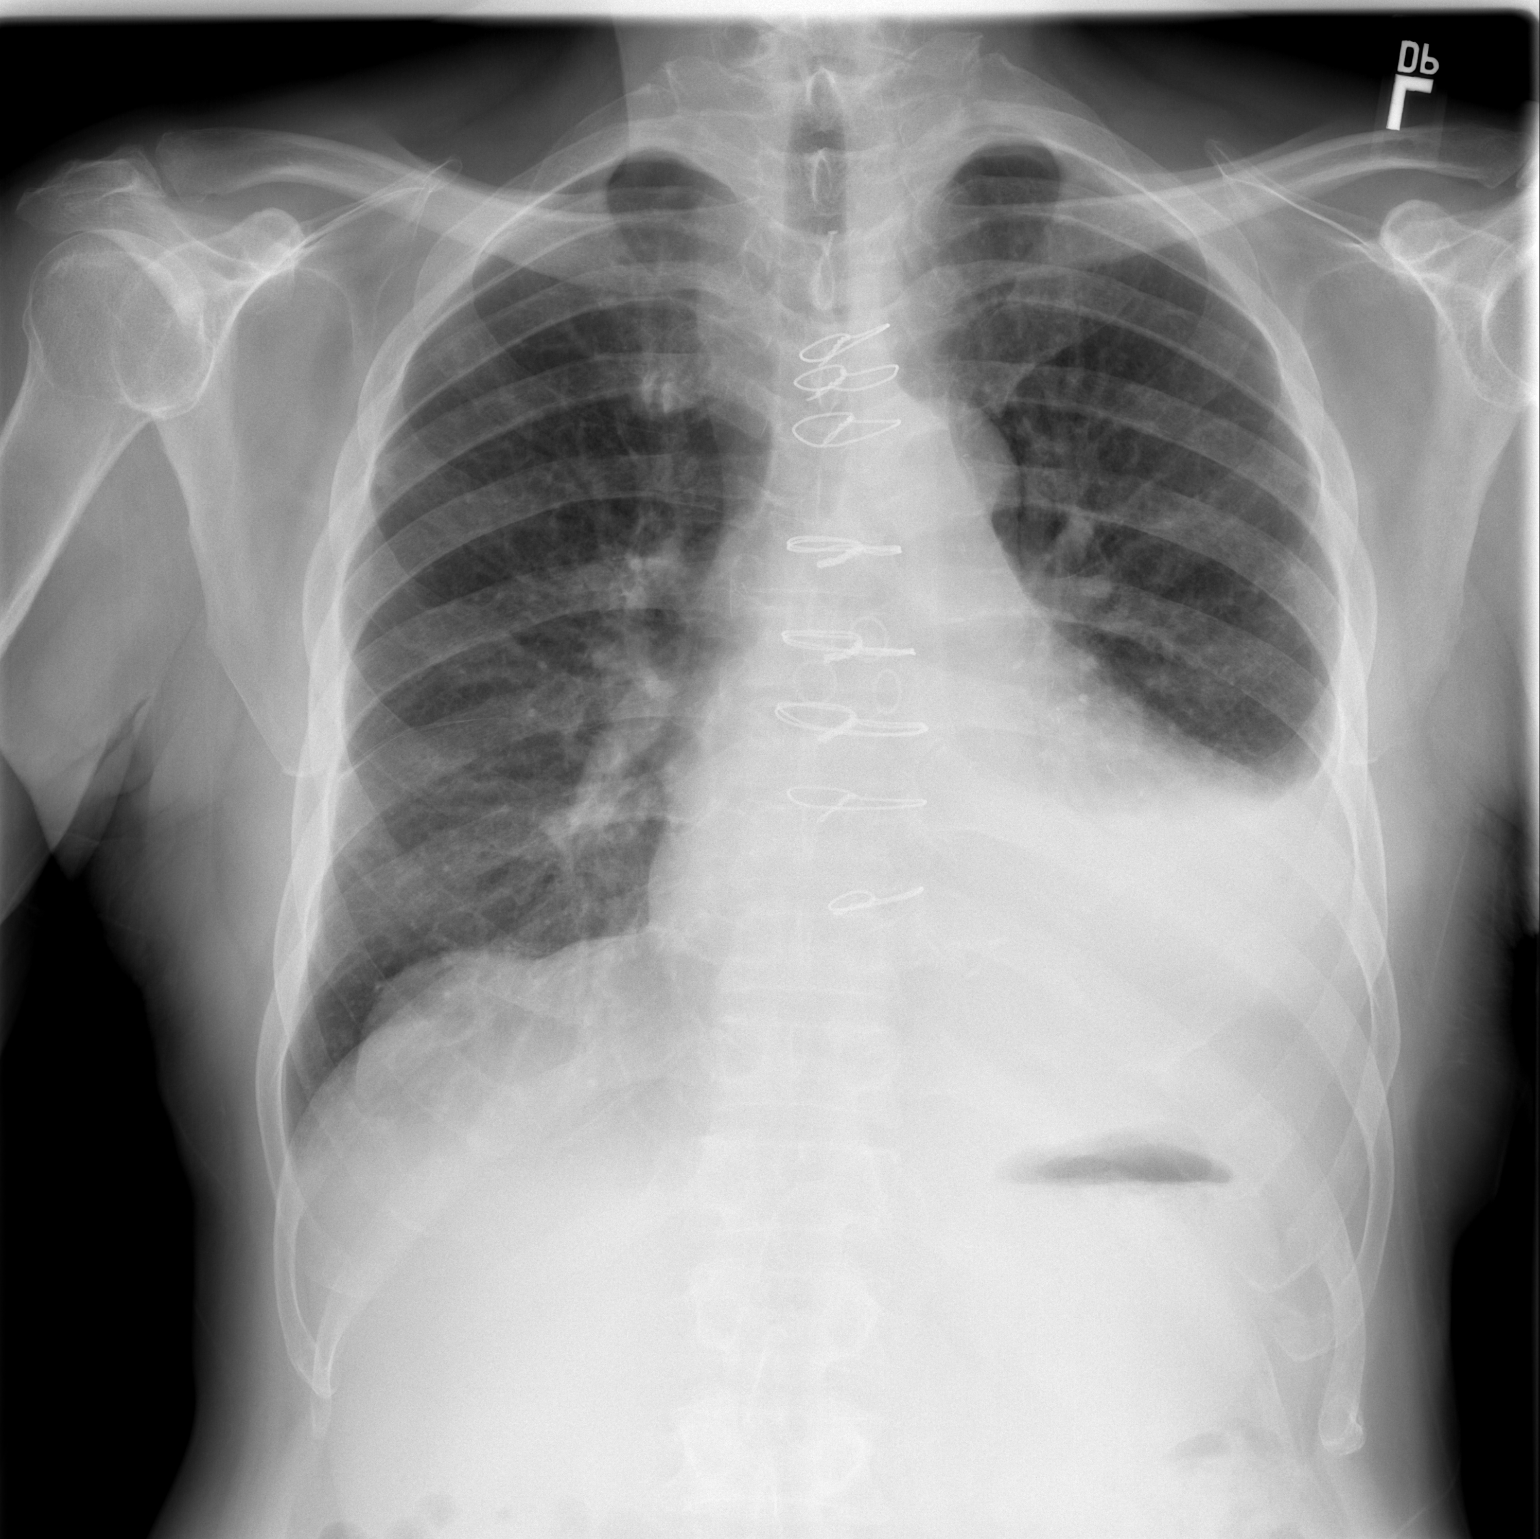

[w chest lat]
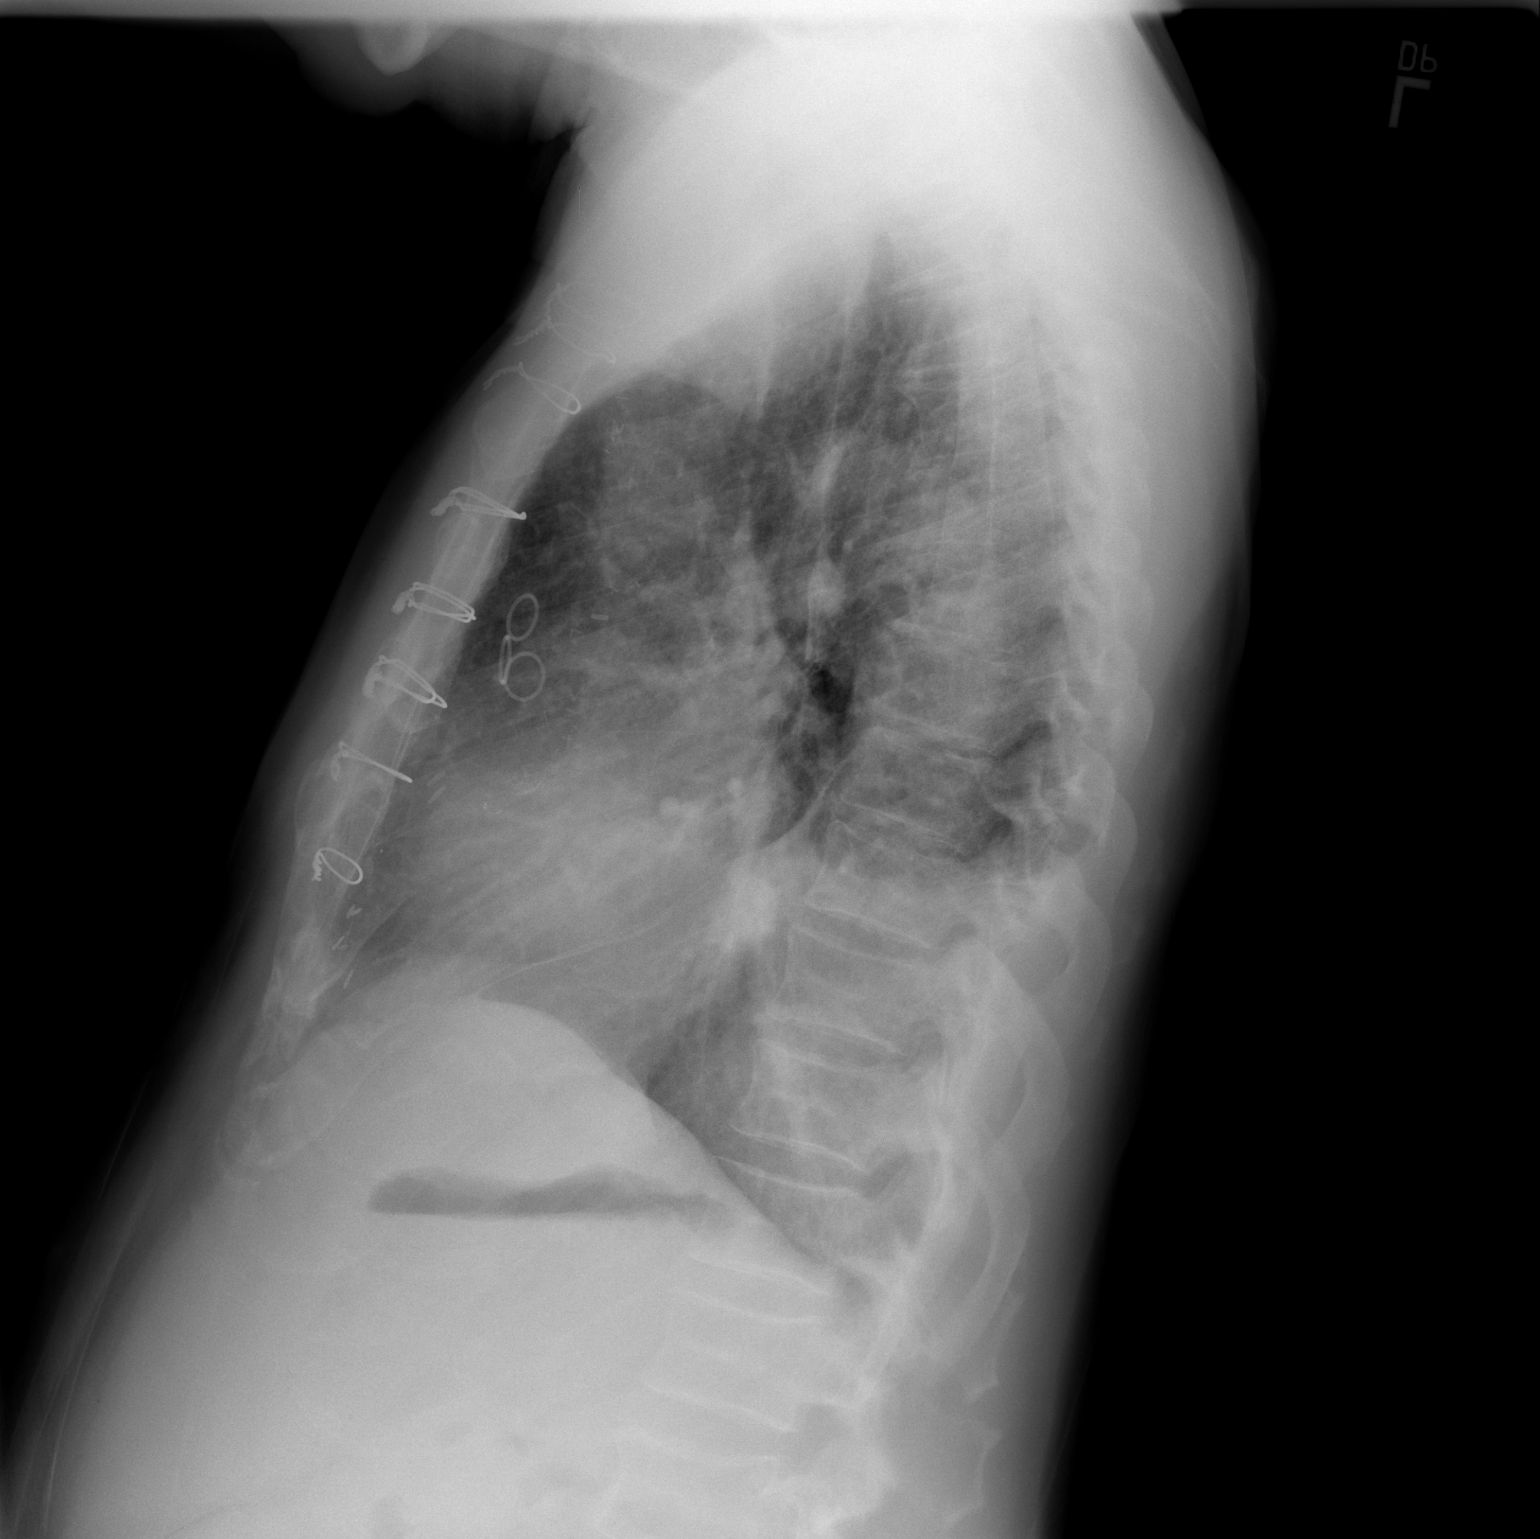

[2 of 2 positions shown; findings below may reference images not displayed]

FINDINGS: Aeration has improved.  However there is still a moderate
sized left pleural effusion present with left basilar atelectasis.
Cardiomegaly is noted and there may be minimal pulmonary vascular
congestion present.  Median sternotomy sutures are noted.  No bony
abnormality is seen.
IMPRESSION: Improved aeration.  However there is a persistent moderate-sized
left pleural effusion with question of mild pulmonary vascular
congestion.

## 2012-05-19 IMAGING — CR DG CHEST 2V
2 series · 2 of 2 positions shown · non-contrast
Comparison: 11/05/2009

CLINICAL DATA: Coronary bypass, left pleural effusion

CHEST - 2 VIEW

[view not recorded (1 of 2)]
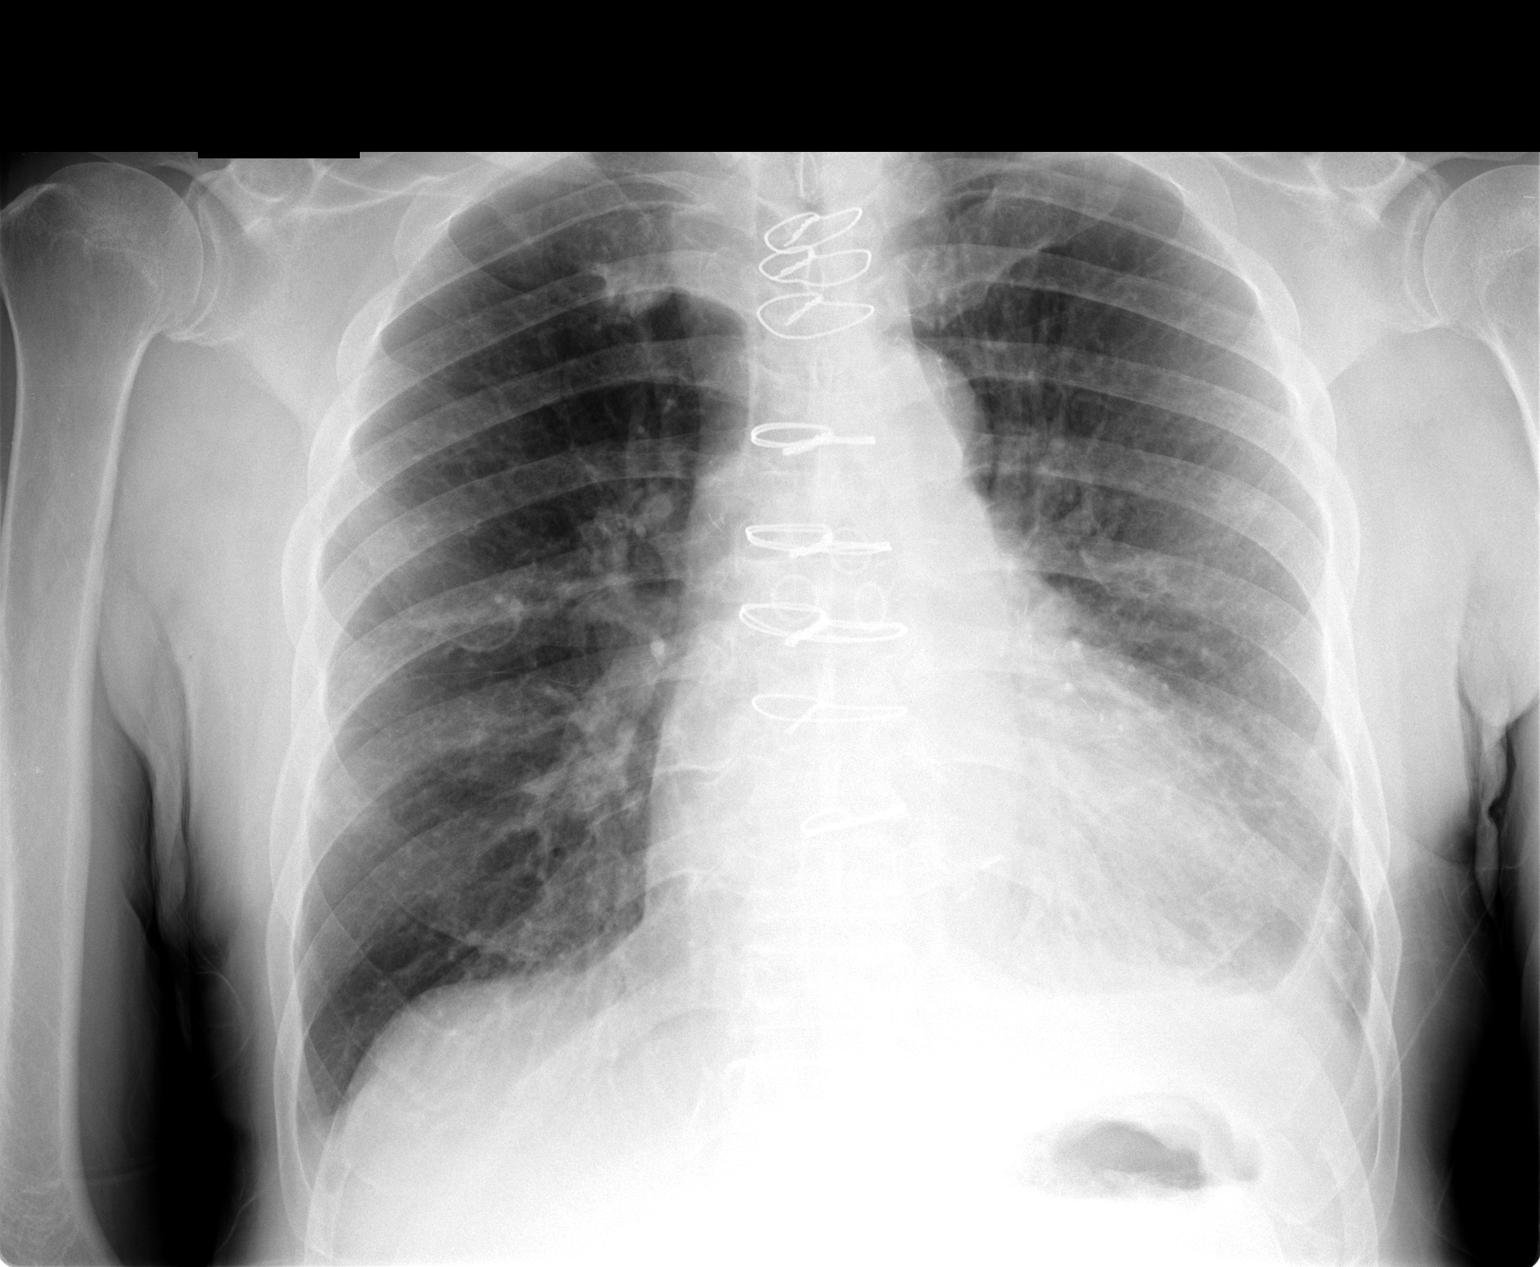

[view not recorded (2 of 2)]
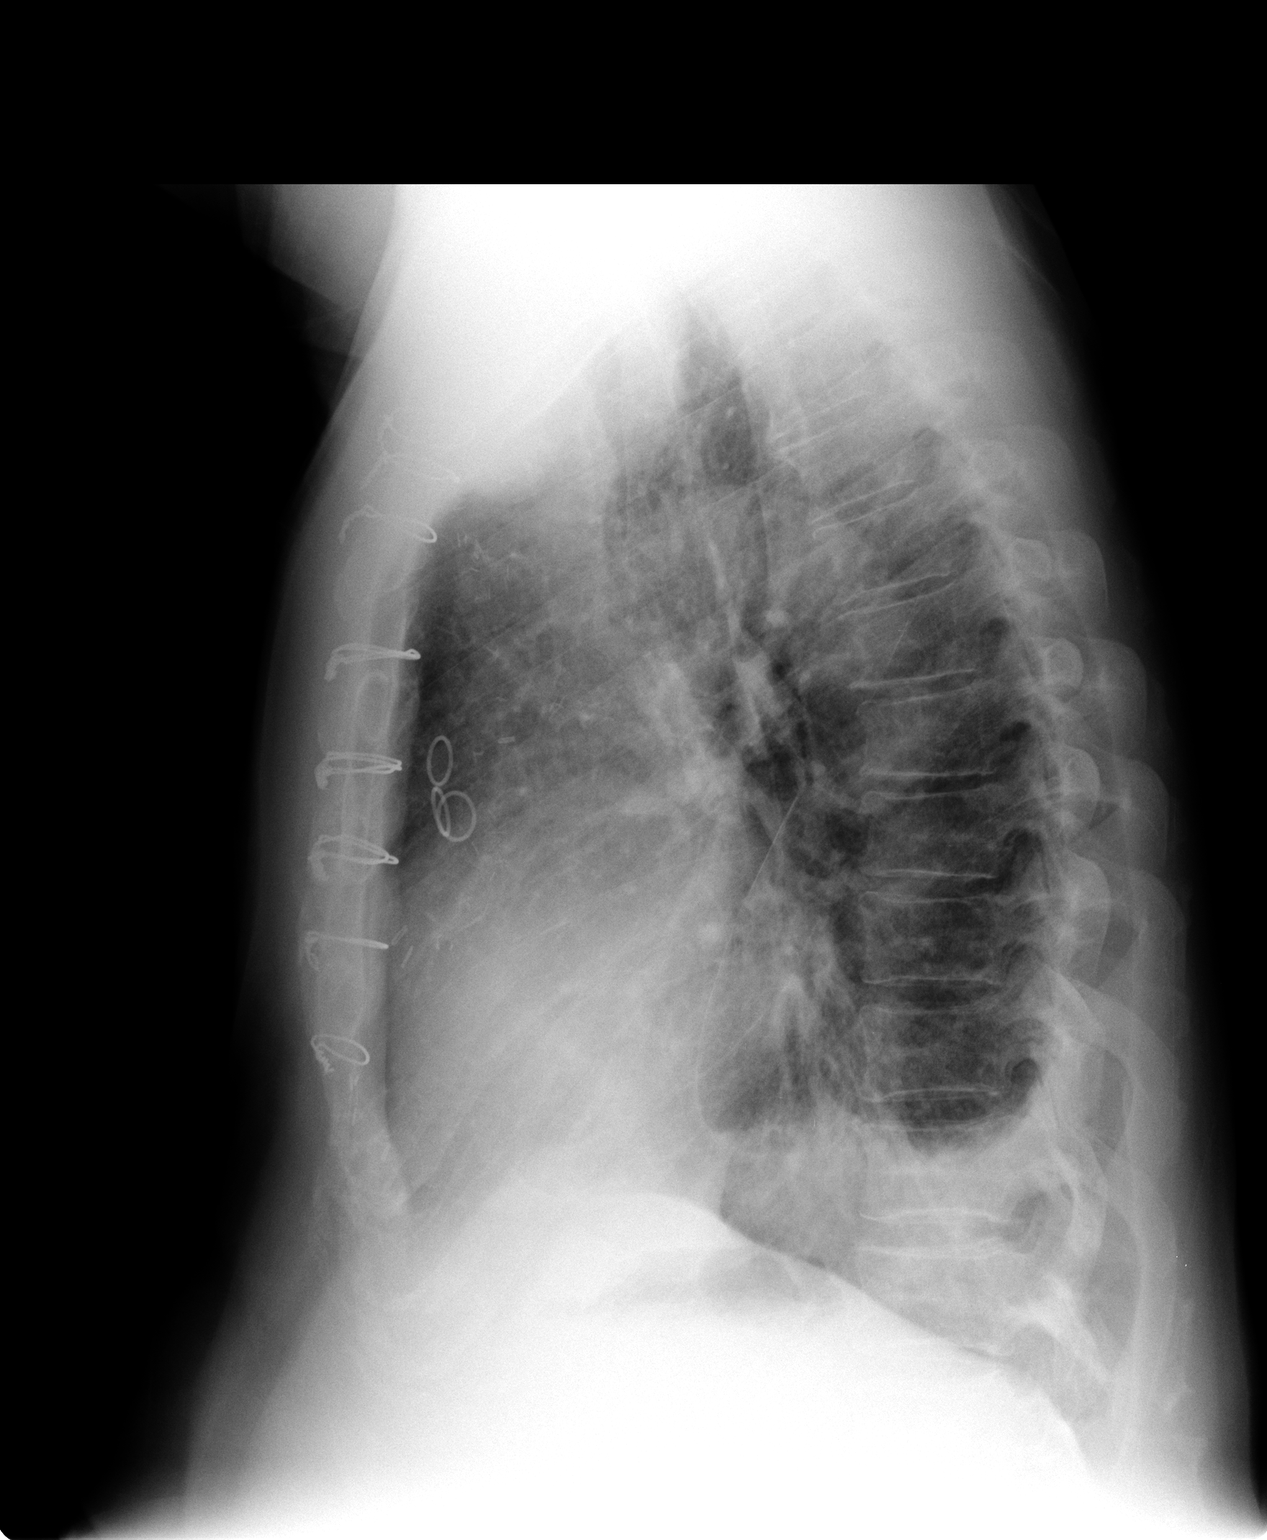

[2 of 2 positions shown; findings below may reference images not displayed]

FINDINGS: Interval decrease in the left pleural effusion since
11/05/2009.  Improvement in left lower lobe aeration as well.  No
pneumothorax.  Heart remains enlarged.  Coronary bypass changes
noted.  No CHF.
IMPRESSION: Improving left lower lobe atelectasis and left pleural effusion

## 2012-06-09 IMAGING — CR DG CHEST 2V
2 series · 2 of 2 positions shown · non-contrast
Comparison: Chest x-ray 11/14/2009

CLINICAL DATA: Bypass surgery.

CHEST - 2 VIEW

[w chest ap]
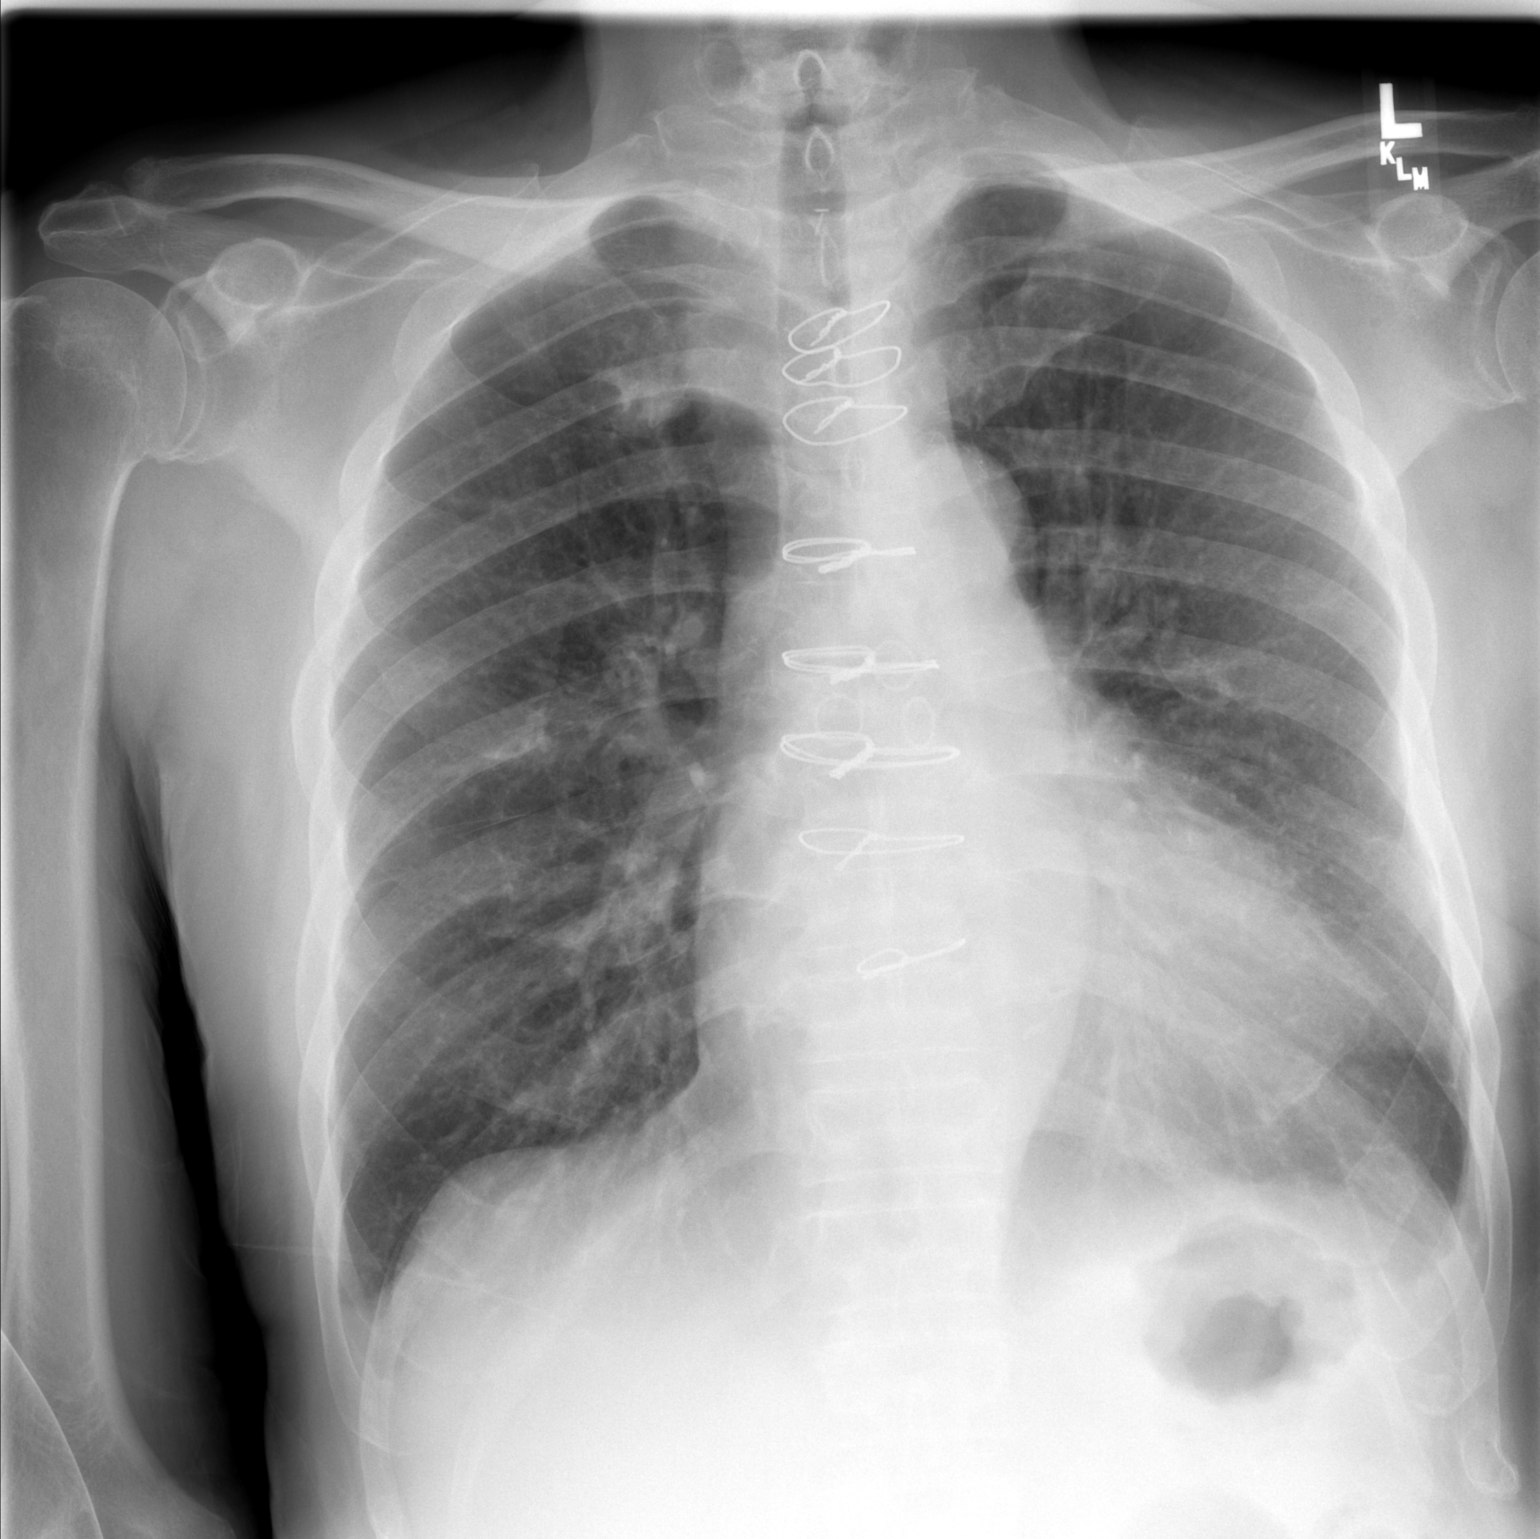

[w chest lat]
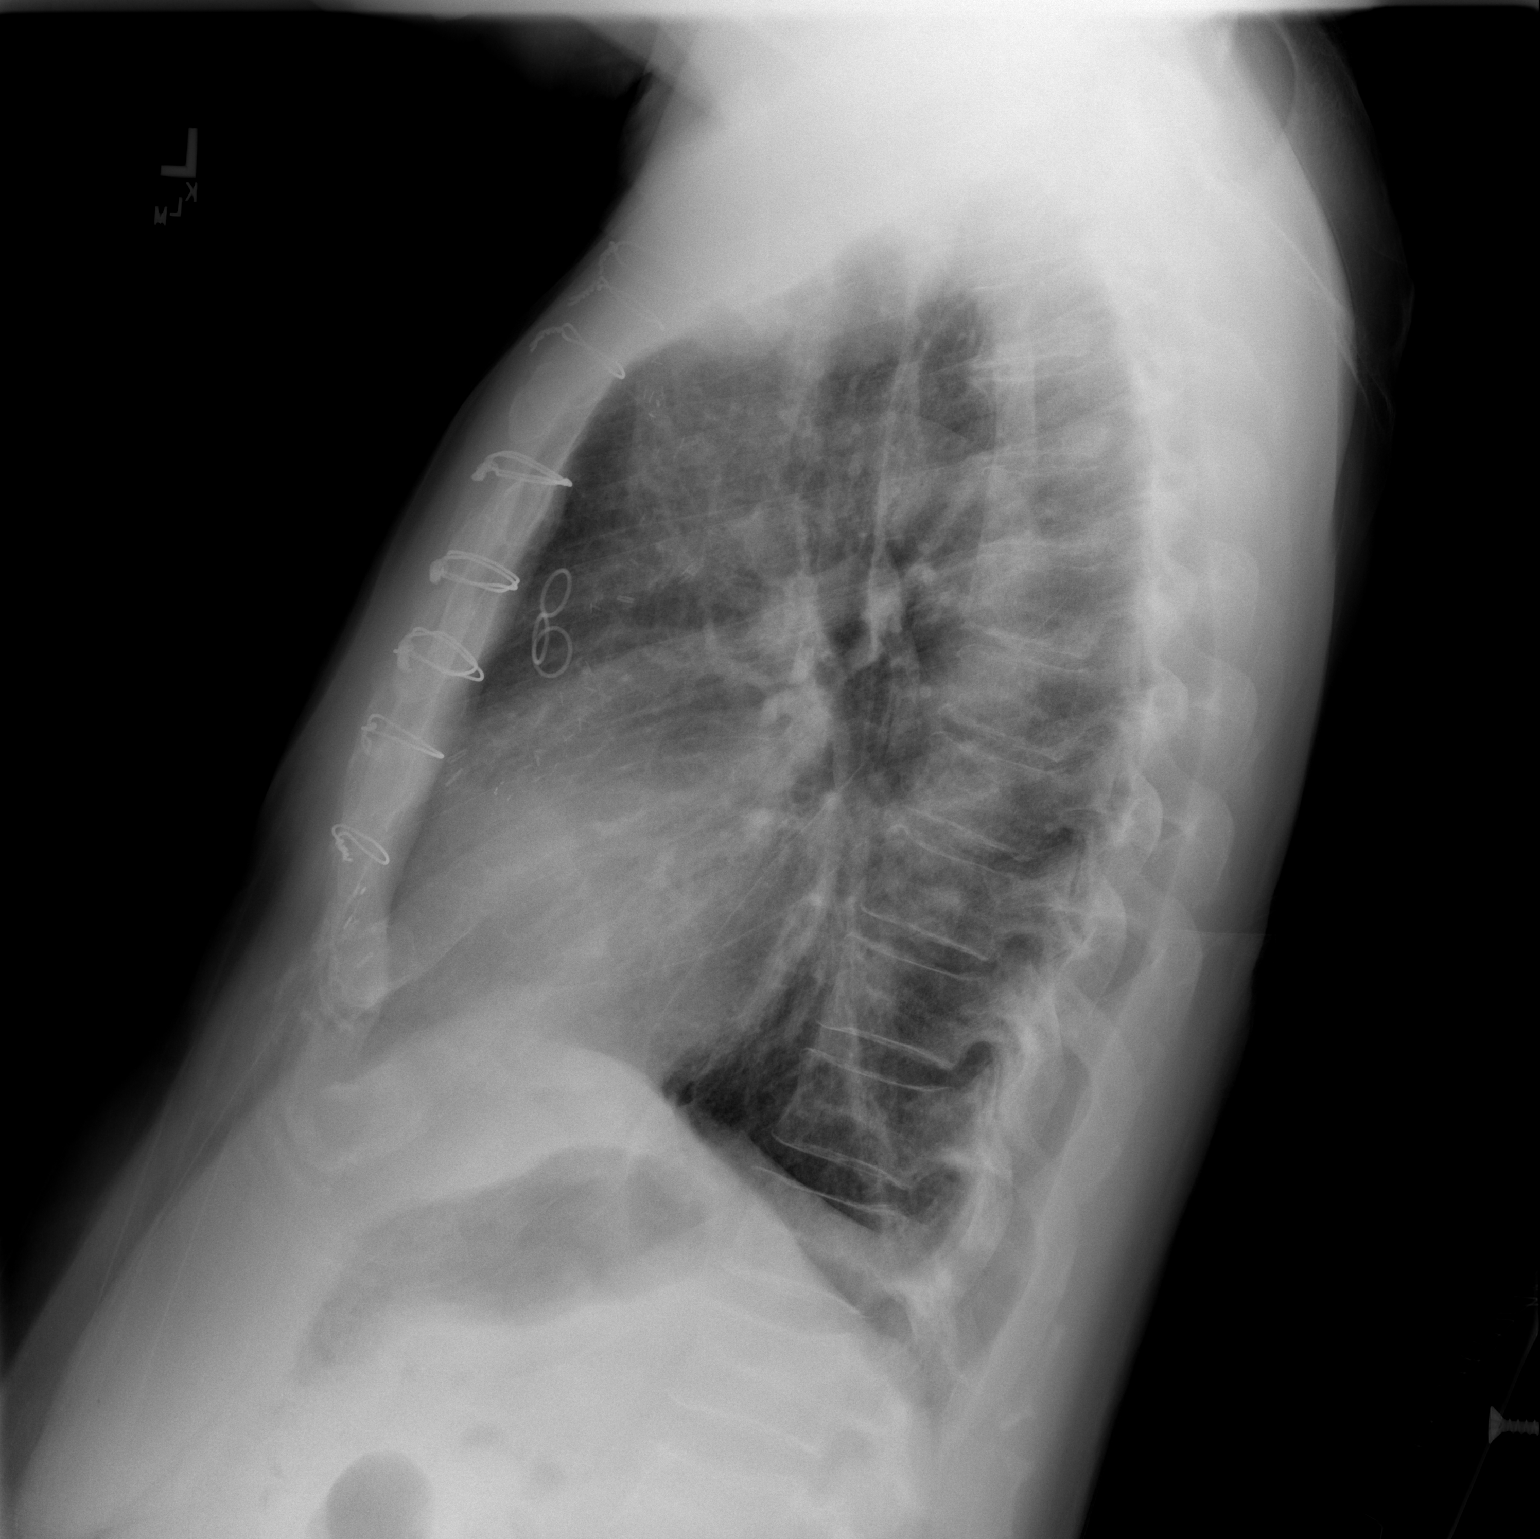

[2 of 2 positions shown; findings below may reference images not displayed]

FINDINGS: Stable surgical changes from triple bypass surgery.
There are chronic bronchitic type lung changes, likely related to
smoking.  No infiltrates, edema or effusions.  The left lung base
has cleared.  The bony thorax is intact.
IMPRESSION: 1.  Chronic-appearing bronchitic type lung changes, likely related
to smoking.
2.  No infiltrates, edema or effusions.

## 2012-06-30 IMAGING — CT CT NECK W/O CM
4 of 5 series · 15 of 33 positions shown, 18 images · non-contrast
Comparison: None.

CLINICAL DATA: Eat fish last evening.  Feels as if fish bone is
stuck in throat.  Difficulty swallowing.  High blood pressure.
Coronary artery disease.

CT NECK WITHOUT CONTRAST
TECHNIQUE: Multidetector CT imaging of the neck was performed
without intravenous contrast.

[Series 2: 2cc/30ml and 1cc/45ml · axial · 0.47mm/px · z∈[-255,-85]mm · 5 of 104 slices shown, 7 images]
[im 18/104  soft-tissue]
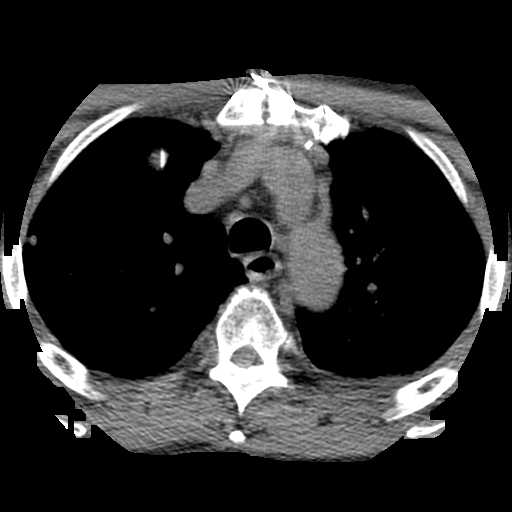
[im 18/104  bone]
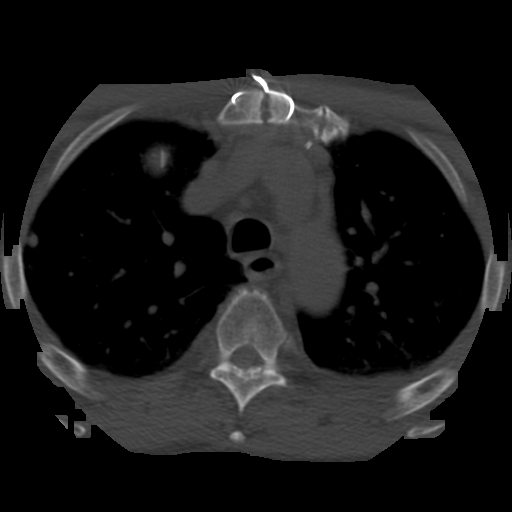
[im 35/104  bone]
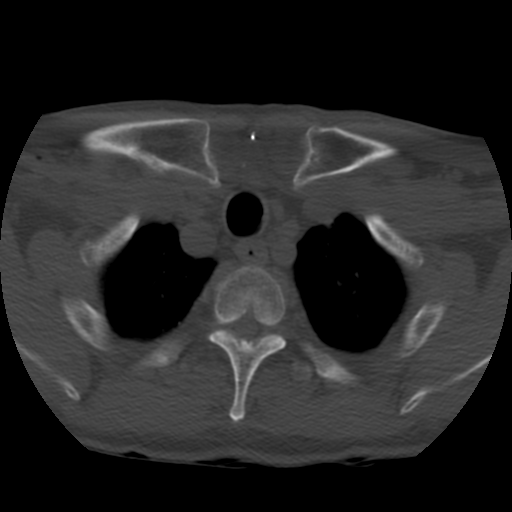
[im 52/104  bone]
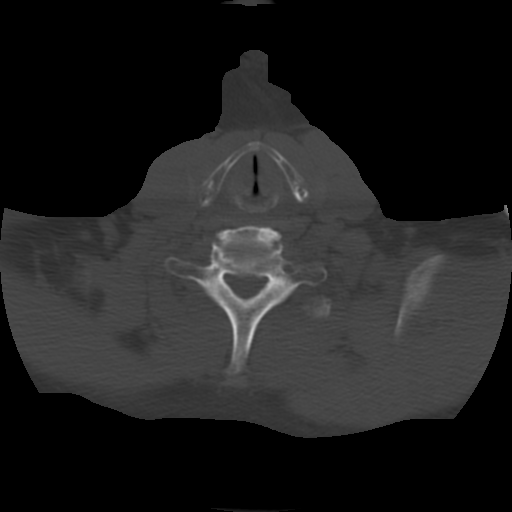
[im 69/104  bone]
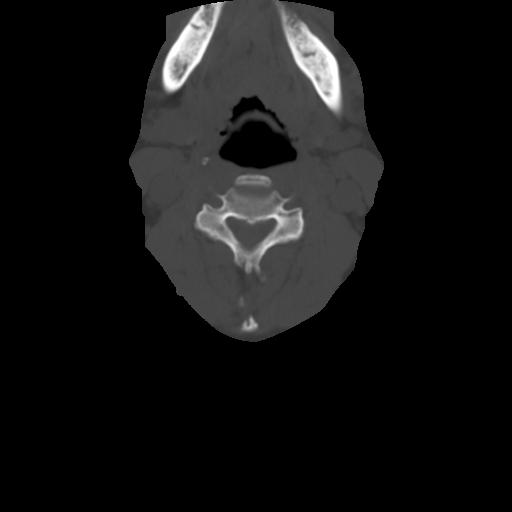
[im 86/104  soft-tissue]
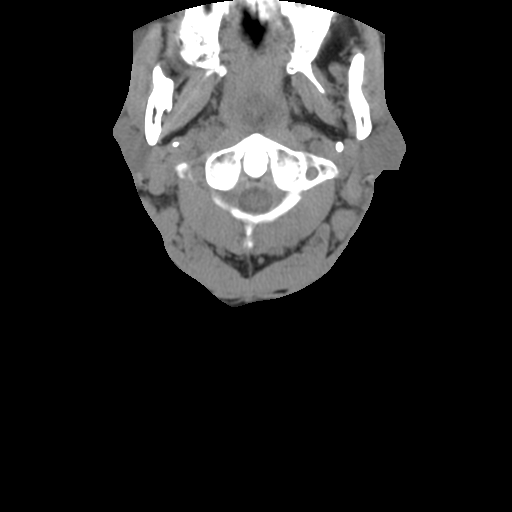
[im 86/104  bone]
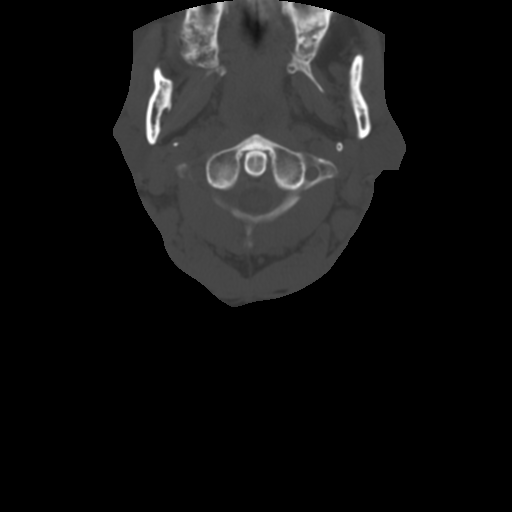

[Series 4: recon 3: 2cc/30ml and 1cc/45ml · axial · 0.59mm/px · z∈[-257,-215]mm · 2 of 51 slices shown]
[im 17/51  bone]
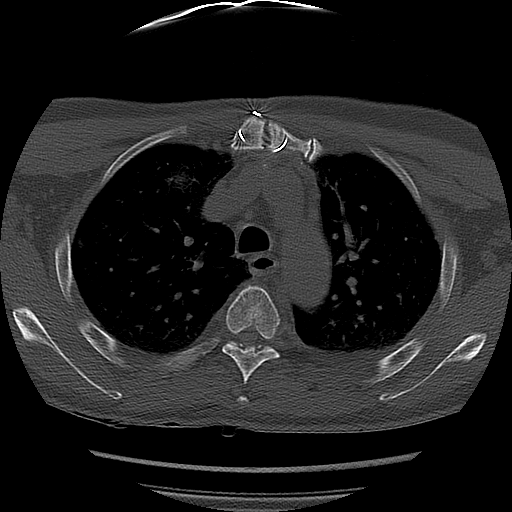
[im 34/51  bone]
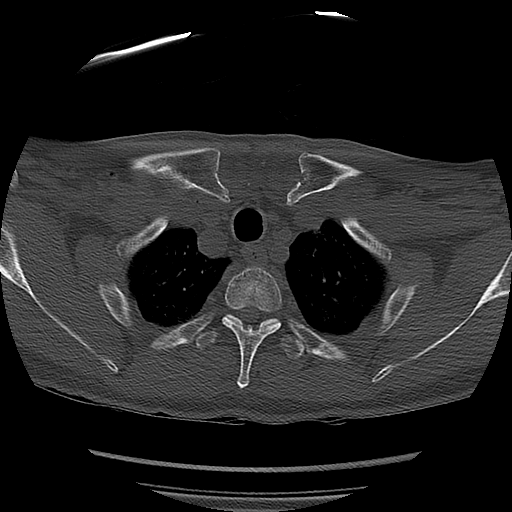

[Series 300: sagittal · sagittal · 0.52mm/px · 5 of 68 slices shown, 6 images]
[im 23/68  bone]
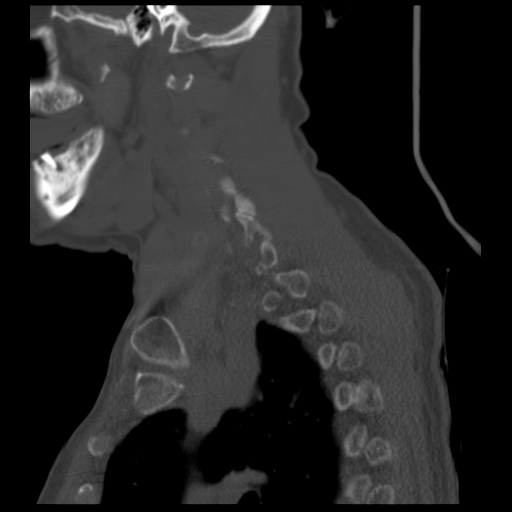
[im 28/68  bone]
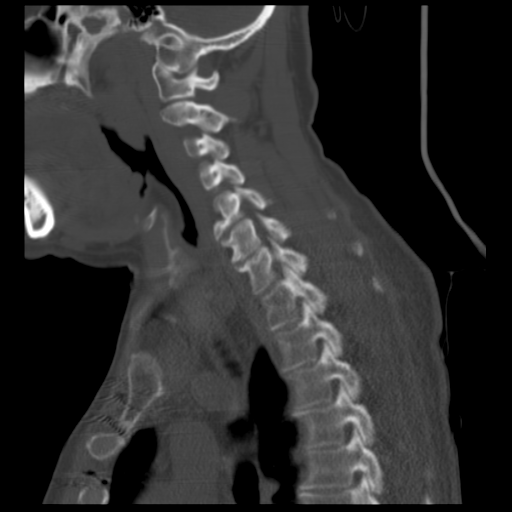
[im 34/68  soft-tissue]
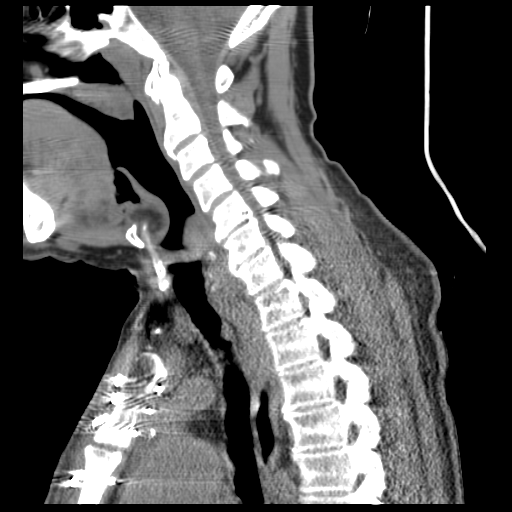
[im 34/68  bone]
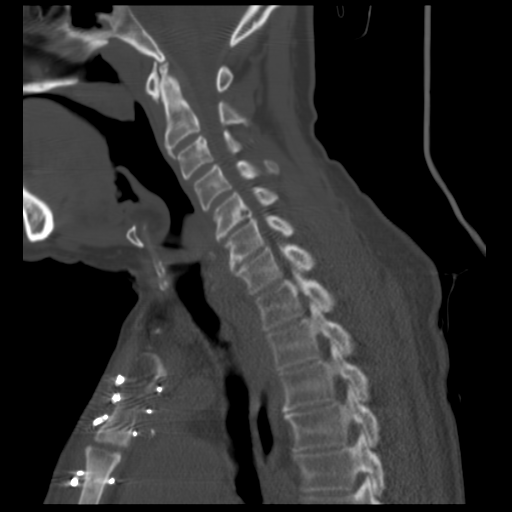
[im 40/68  bone]
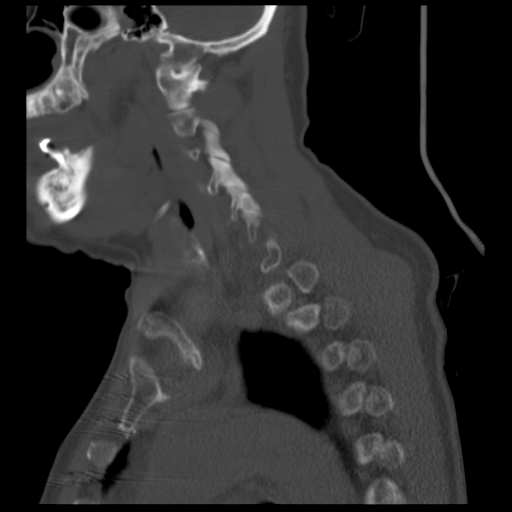
[im 45/68  bone]
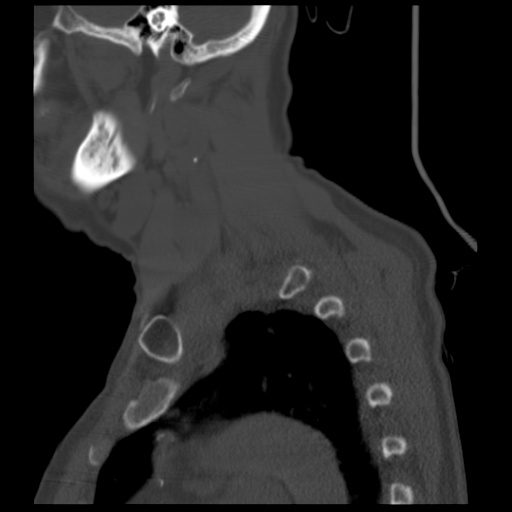

[Series 301: coronal · coronal · 0.52mm/px · 3 of 67 slices shown]
[im 14/67  bone]
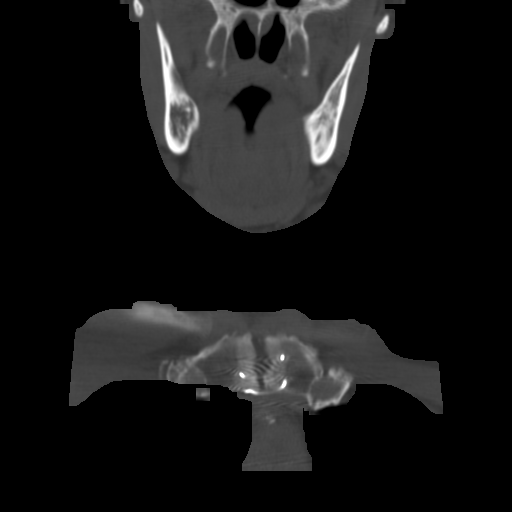
[im 27/67  bone]
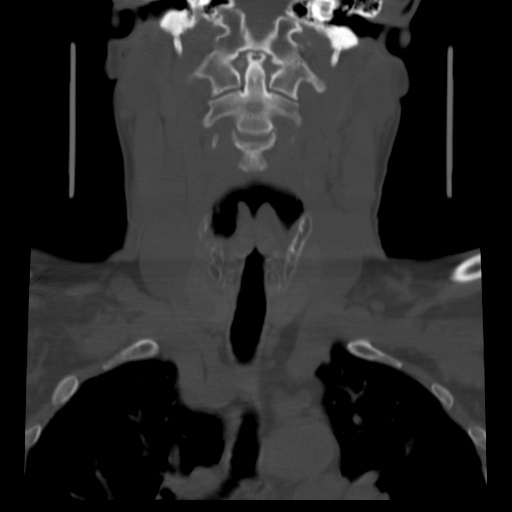
[im 40/67  bone]
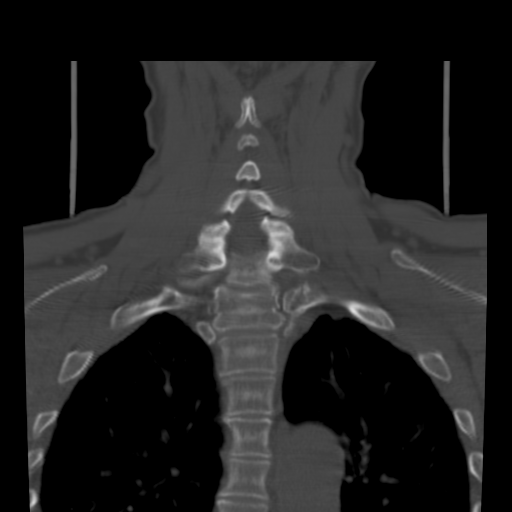

[15 of 33 positions shown; findings below may reference images not displayed]

FINDINGS: No radiopaque foreign body noted.

Dilated ascending thoracic aorta incompletely evaluated on the
present exam.  This may measure up to 4.3 cm.

Low pretracheal adenopathy measuring 2.1 x 1.4 cm.  Scattered lymph
nodes throughout the neck with short axis dimension less than 1 cm.

Left carotid bifurcation calcifications.

Peripheral right upper lobe 7 mm noncalcified nodule (series 2
image 87). If the patient is at high risk for bronchogenic
carcinoma, follow-up chest CT at 3-6 months is recommended.  If the
patient is at low risk for bronchogenic carcinoma, follow-up chest
CT at 6-12 months is recommended.  This recommendation follows the
consensus statement: Guidelines for Management of Small Pulmonary
Nodules Detected on CT Scans: A Statement from the [employer]
[URL]

Prior median sternotomy.

Right lobe of thyroid gland 1.6 cm lesion.  This can be evaluated
with elective ultrasound.

Cervical spondylotic changes.

Marked dental caries.

Left posterior lateral wall of the upper trachea with probable
mucous.

Mild prominence soft tissue posterior-superior nasopharynx.  This
may represent adenoidal tissue.  Mucosal abnormality cannot be
excluded.
IMPRESSION: No radiopaque foreign body noted.

Dilated ascending thoracic aorta incompletely evaluated on the
present exam.  This may measure up to 4.3 cm.

Low pretracheal adenopathy measuring 2.1 x 1.4 cm.  Scattered lymph
nodes throughout the neck with short axis dimension less than 1 cm.

Peripheral right upper lobe 7 mm noncalcified nodule (series 2
image 87).  Follow up as noted above.

Right lobe of thyroid gland 1.6 cm lesion.  This can be evaluated
with elective ultrasound.

Marked dental caries.

Mild prominence soft tissue posterior-superior nasopharynx.  This
may represent adenoidal tissue.  Mucosal abnormality cannot be
excluded.

## 2012-09-16 ENCOUNTER — Other Ambulatory Visit: Payer: Self-pay | Admitting: Cardiology

## 2012-10-26 ENCOUNTER — Other Ambulatory Visit: Payer: Self-pay | Admitting: Cardiology

## 2012-12-16 ENCOUNTER — Other Ambulatory Visit: Payer: Self-pay | Admitting: Cardiology

## 2013-01-14 ENCOUNTER — Other Ambulatory Visit: Payer: Self-pay | Admitting: Cardiology

## 2013-02-18 ENCOUNTER — Other Ambulatory Visit: Payer: Self-pay | Admitting: Cardiology

## 2013-03-03 IMAGING — CT CT ABD-PELV W/O CM
2 of 4 series · 15 of 46 positions shown, 17 images · non-contrast
Comparison: None.

CLINICAL DATA: Left-sided flank and lower back pain for 1 week
history of hypertension, CAD, CABG and urethral stricture

CT ABDOMEN AND PELVIS WITHOUT CONTRAST
TECHNIQUE: Multidetector CT imaging of the abdomen and pelvis was
performed following the standard protocol without intravenous
contrast.

[Series 2: a/p w/o 5.0 b31f st · axial · non-contrast · 0.70mm/px · z∈[-434,-60]mm · 12 of 86 slices shown, 14 images]
[im 7/86  soft-tissue]
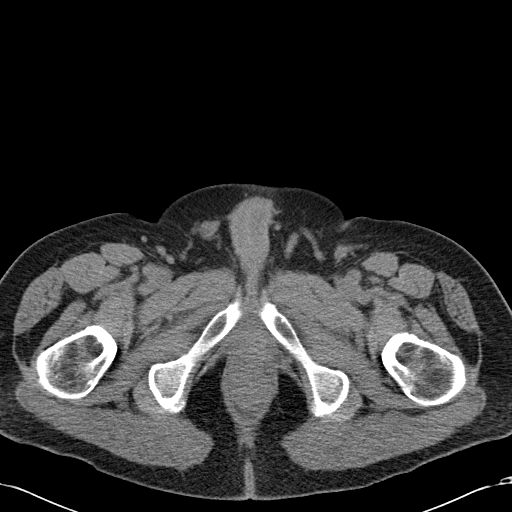
[im 7/86  bone]
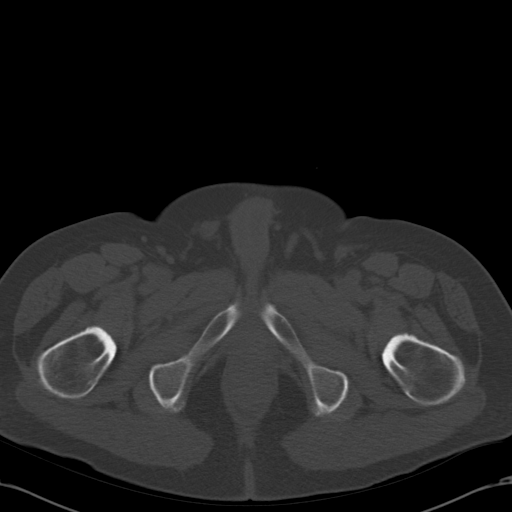
[im 14/86  soft-tissue]
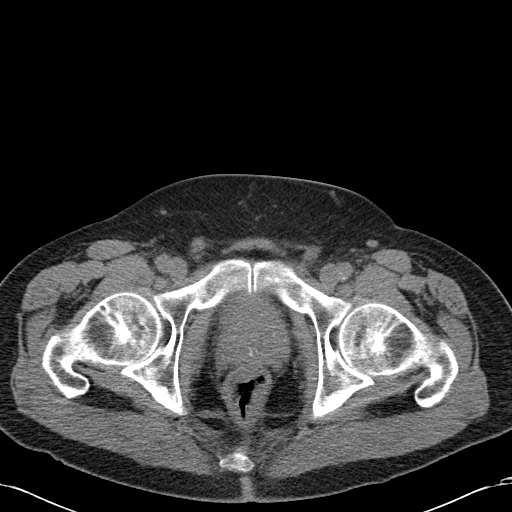
[im 21/86  soft-tissue]
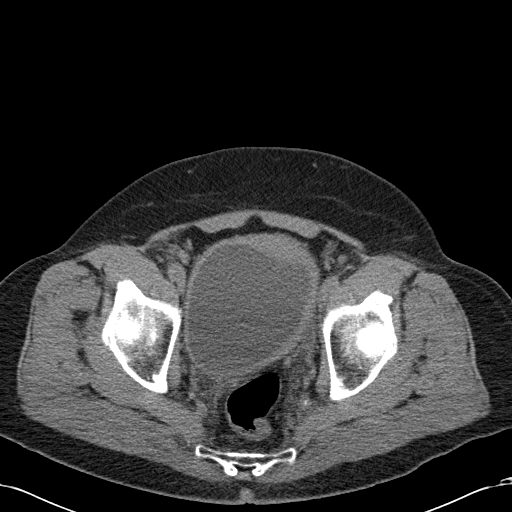
[im 28/86  soft-tissue]
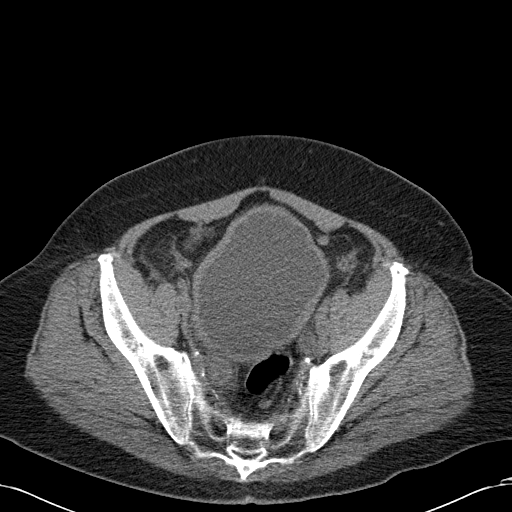
[im 35/86  soft-tissue]
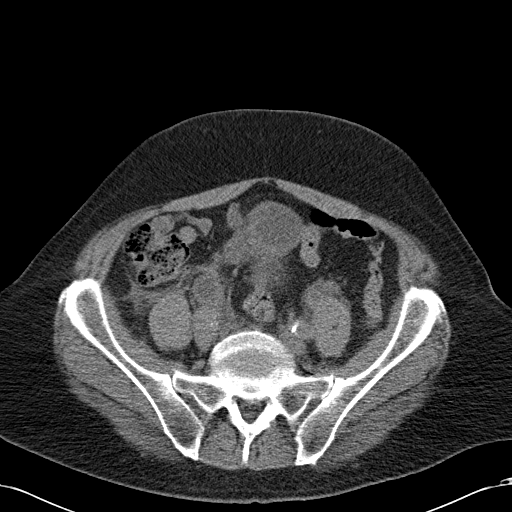
[im 41/86  soft-tissue]
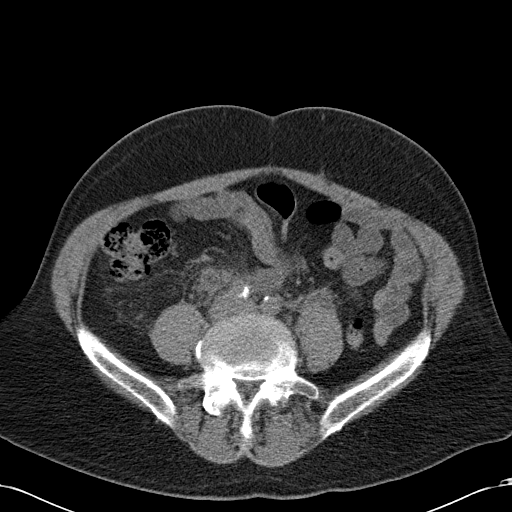
[im 48/86  soft-tissue]
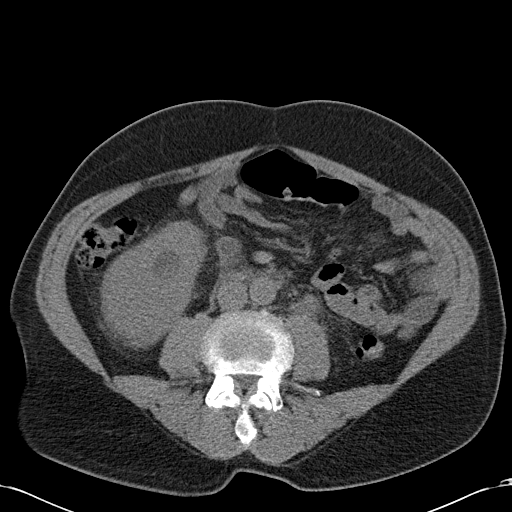
[im 55/86  soft-tissue]
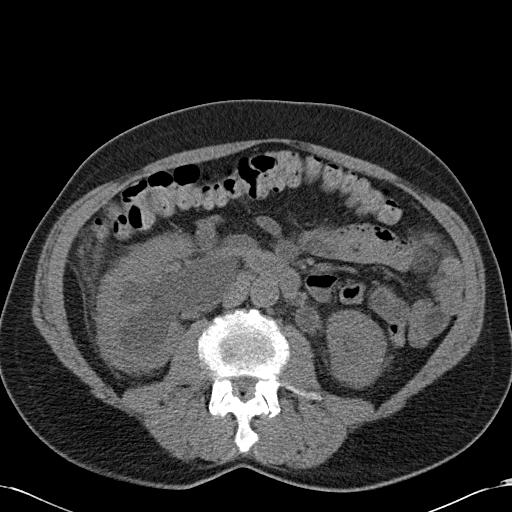
[im 62/86  soft-tissue]
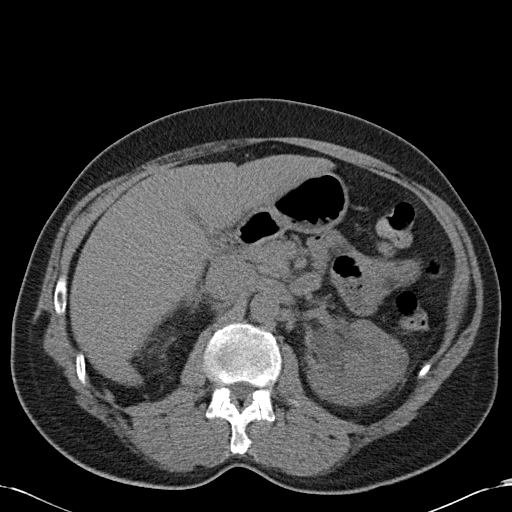
[im 62/86  bone]
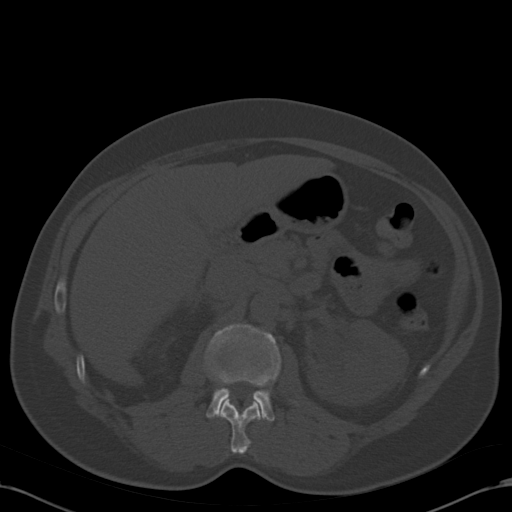
[im 69/86  soft-tissue]
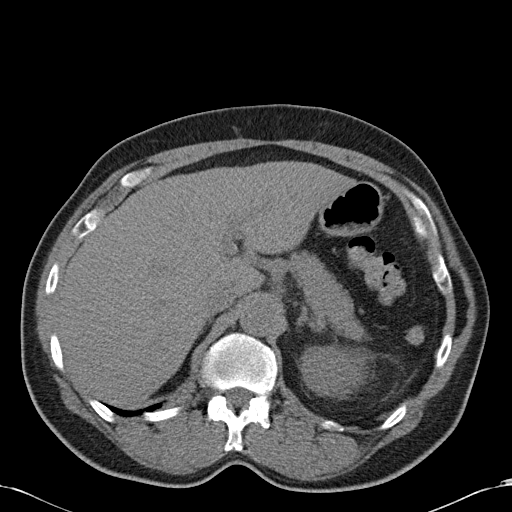
[im 75/86  soft-tissue]
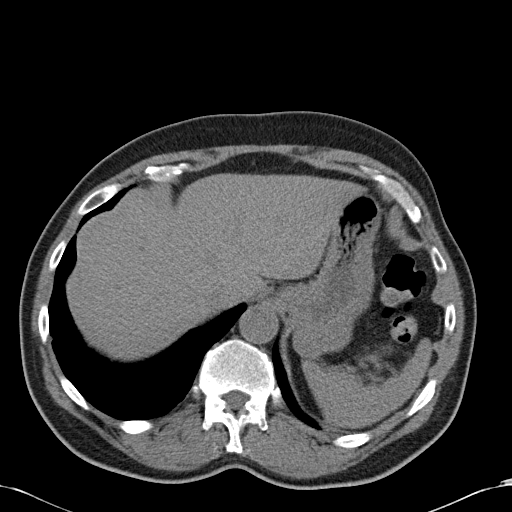
[im 82/86  soft-tissue]
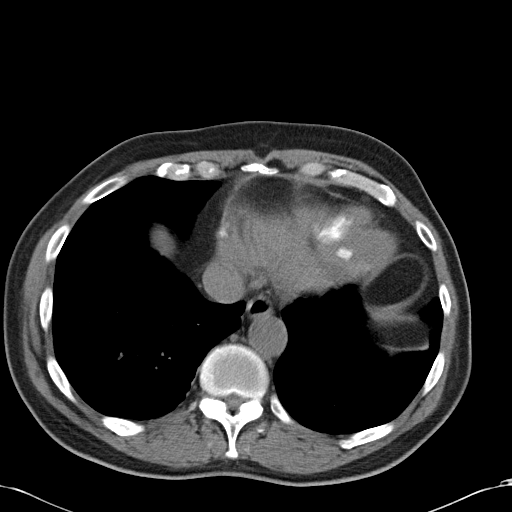

[Series 5: a/p w/o 2.0 spo cor st · coronal · non-contrast · 0.83mm/px · 3 of 105 slices shown]
[im 35/105  soft-tissue]
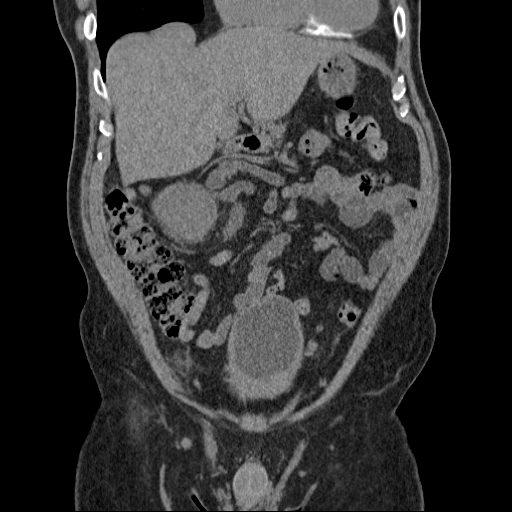
[im 47/105  soft-tissue]
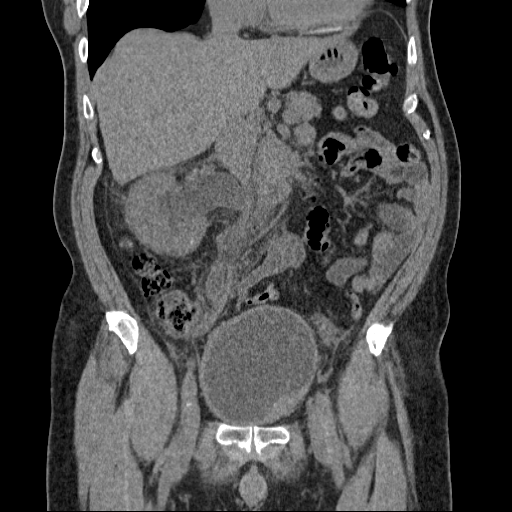
[im 58/105  soft-tissue]
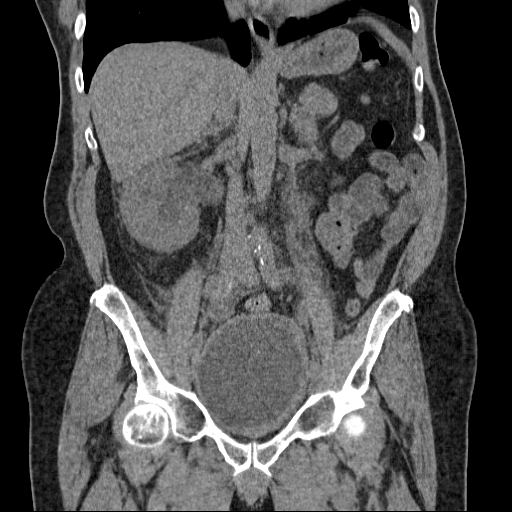

[15 of 46 positions shown; findings below may reference images not displayed]

FINDINGS: The lack of intravenous contrast limits the ability to evaluate
solid abdominal organs.

Normal hepatic contour.  No discrete hyper hypoattenuating hepatic
lesions.  Normal gallbladder.  No ascites.

The right kidney is noted to have a slighly horizontal lie.  There
is moderate to severe right-sided and mild to moderate left sided
hydronephrosis and ureterectasis to the level of the urinary
bladder with associated stranding within the retroperitoneum, right
greater than left.  No radiopaque renal stones.  The urinary
bladder is enlarged and diffusely thick walled, possibly the
sequela of reported history of ureteral stricture.   There is
apparent inferior and ateral aspect of the urinary bladder wall
(best seen on coronal images 39 through 47).  The prostate is
borderline enlarged.   There is a possible punctate sub centimeter
hypoattenuating lesion within the superior pole of the left kidney
(image 26), which is not characterized without intravenous
contrast.  The bilateral adrenal glands are normal.  Normal
noncontrast appearance of the pancreas and spleen.

Scattered minimal colonic diverticulosis without CT evidence of
diverticulitis.  The bowel is otherwise normal in course and
caliber without wall thickening or evidence of obstruction.  Normal
appendix.  No pneumoperitoneum, pneumatosis or portal venous gas.
Scattered minimal atherosclerotic calcifications of a normal
caliber abdominal aorta.  Shoddy retroperitoneal and pelvic lymph
nodes are not enlarged by CT criteria with index left periaortic
lymph node measuring 6 mm in greatest short axis diameter (image
28) and index left sided pelvic sidewall lymph node measuring 8 mm
in diameter (image 64).

Limited visualization of the lower thorax is negative for focal
airspace opacity or pleural effusion.  Calcifications about the
left ventricular apex are favored to be pericardial in origin.  No
pericardial effusion.

Age indeterminate compression deformities of the L3, L4 and L5
vertebral bodies.
IMPRESSION: 1.    Bilateral hydronephrosis and ureterectasis, right greater
      than left, the etiology of which is not detected on this
      examination.  Of note, the urinary bladder is noted to be enlarged
      and diffusely thick walled, possibly the sequela of reported
      history of ureteral stricture.  In the absence of comparisons, the
      chronicity of these findings is indeterminate, however the
      stranding within the retroperitoneum would suggest an acute on
      chronic process.

2.    Apparent focal thickening of the inferior lateral aspect of
the urinary bladder wall, possibly artifactual secondary to under
distension within an enlarged/patulous urinary bladder.  If not
recently performed, correlation with cystoscopy is recommended.
3.    Age indeterminate compression deformities of the L3, L4 and
L5 vertebral bodies.  Correlation for point tenderness at these
locations is recommended.

## 2013-03-04 IMAGING — US US RENAL
1 series · 14 of 25 positions shown · non-contrast
Comparison: CT [DATE]

CLINICAL DATA: Bilateral hydronephrosis

RENAL/URINARY TRACT ULTRASOUND COMPLETE

[Series 1: us renal · 0.26mm/px · 14 of 46 slices shown]
[im 1/46]
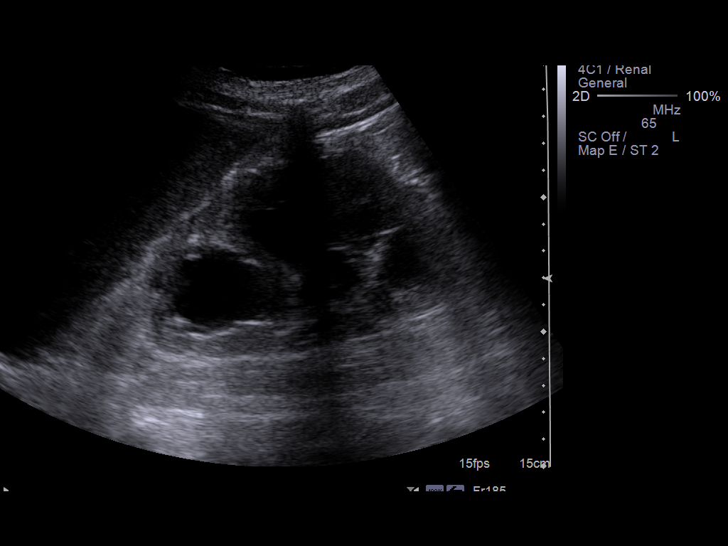
[im 4/46]
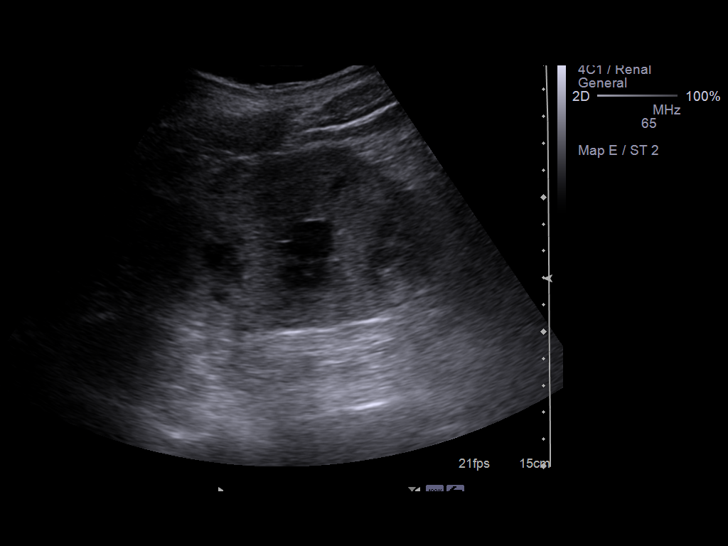
[im 8/46]
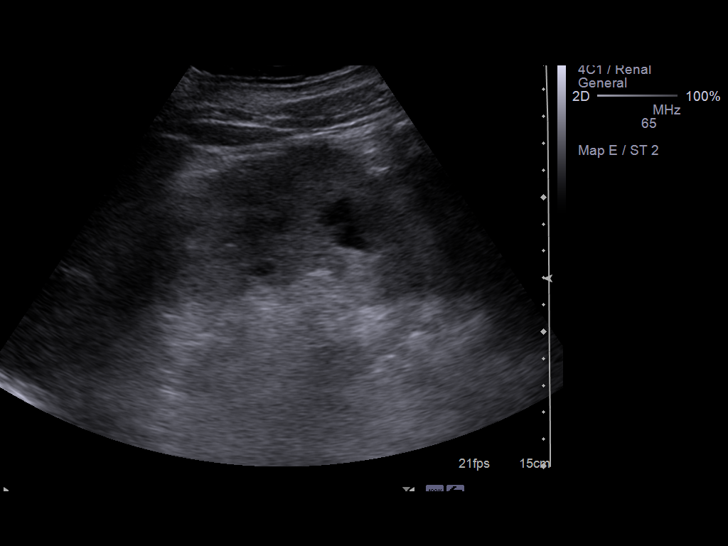
[im 12/46]
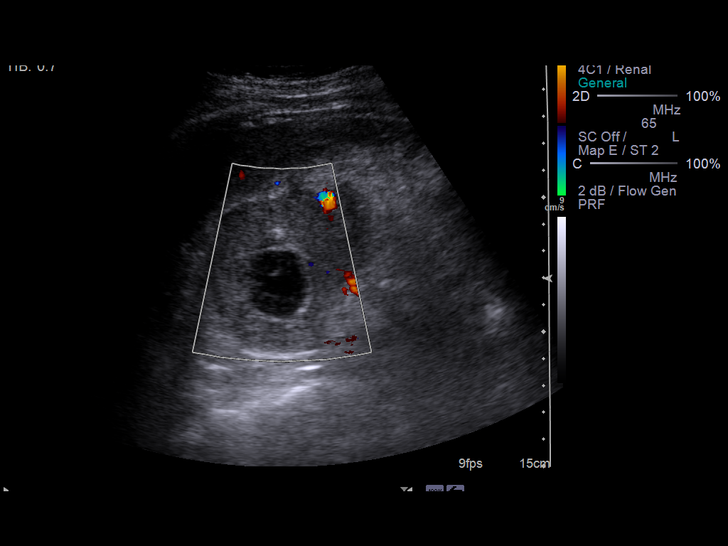
[im 16/46]
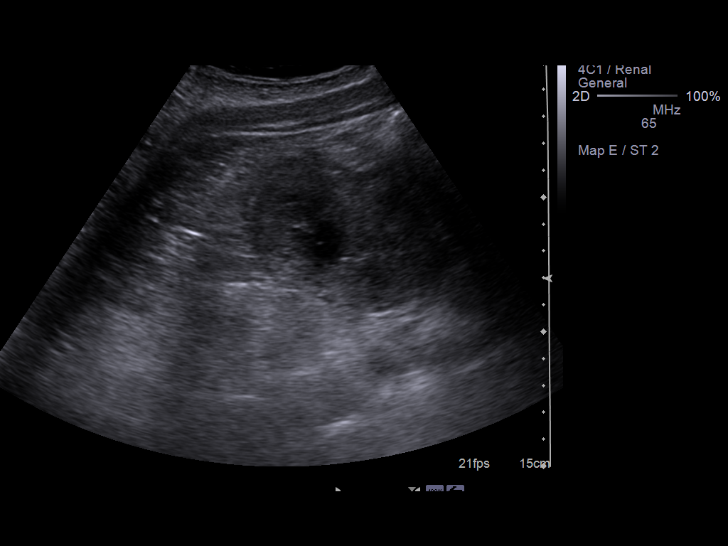
[im 17/46]
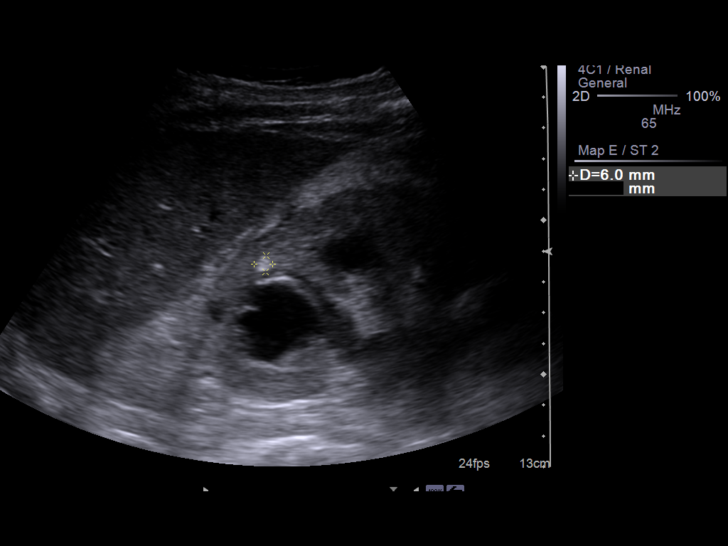
[im 21/46]
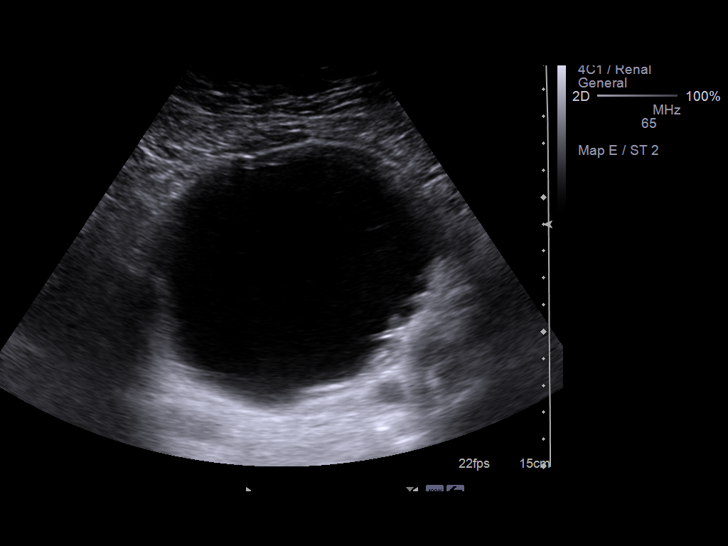
[im 25/46]
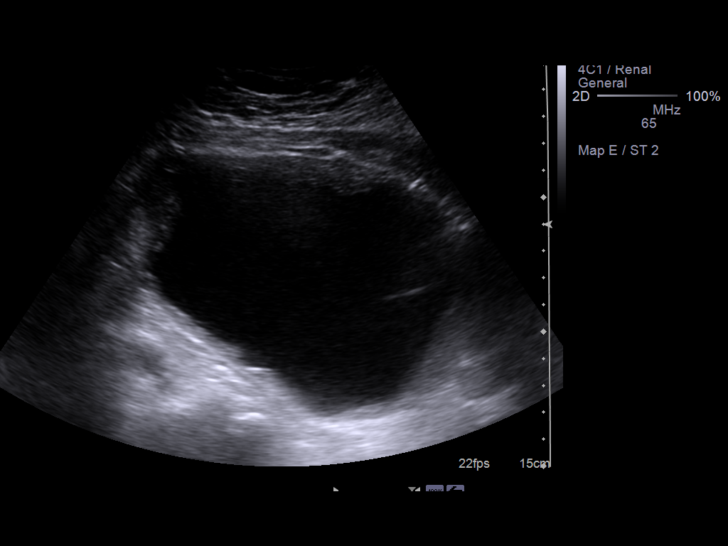
[im 29/46]
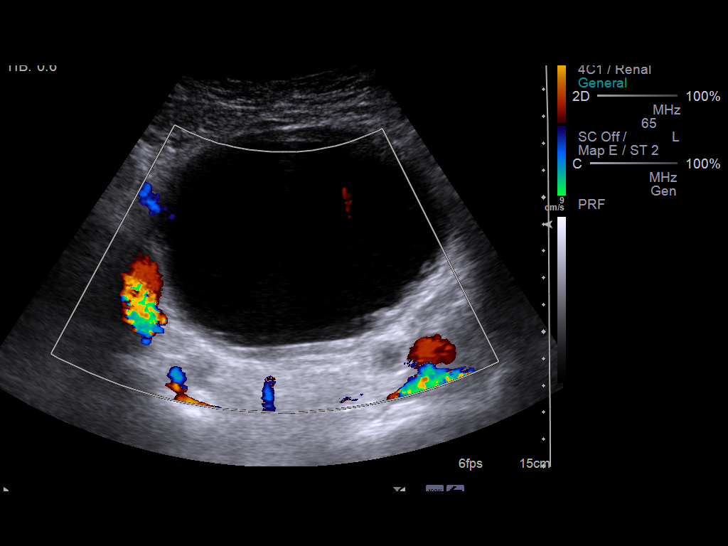
[im 31/46]
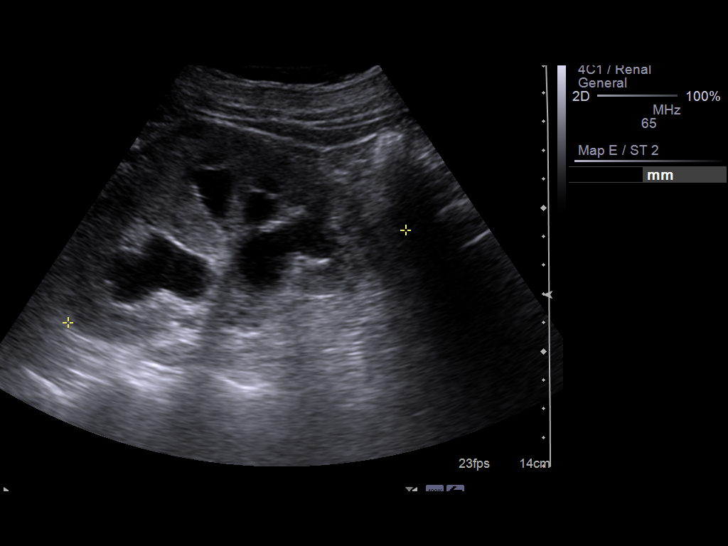
[im 34/46]
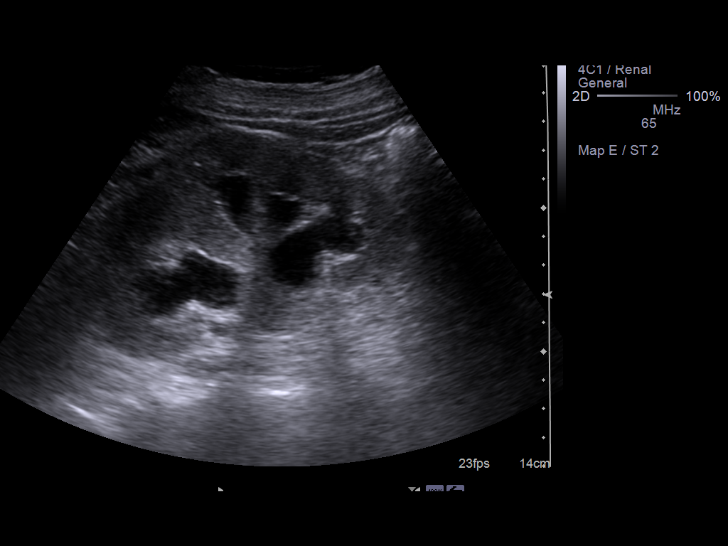
[im 38/46]
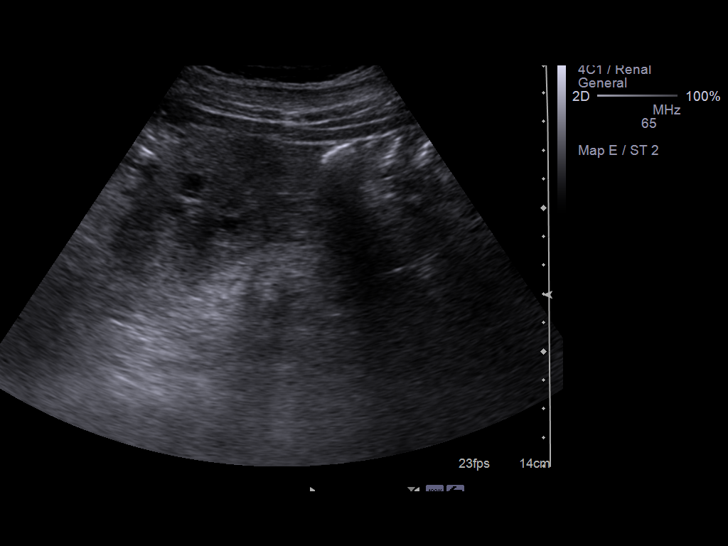
[im 42/46]
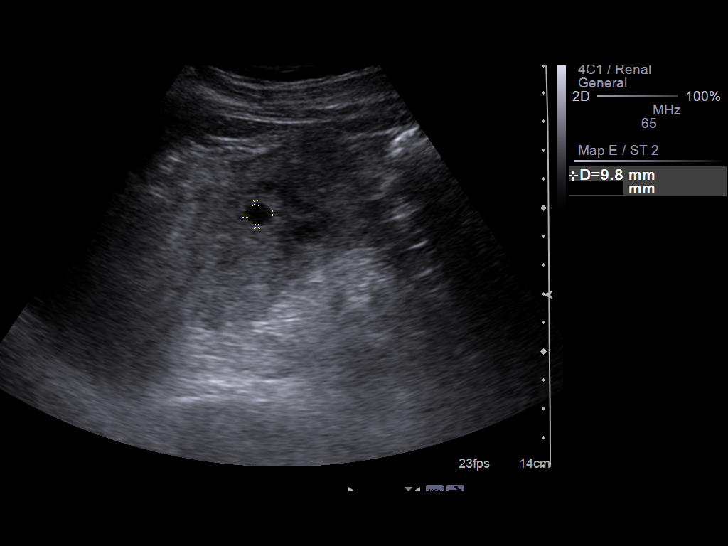
[im 46/46]
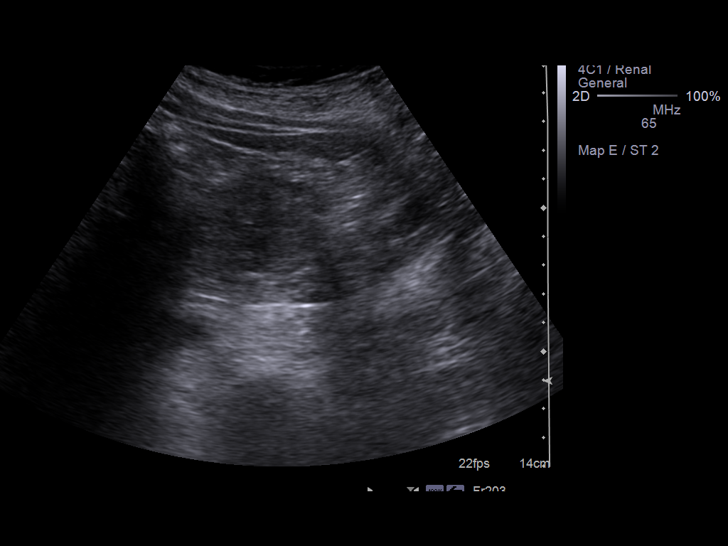

[14 of 25 positions shown; findings below may reference images not displayed]

FINDINGS: Right Kidney:  13.0 cm in length.  Pronounced
hydroureteronephrosis.  9 mm cyst in the upper pole.  No evidence
of mass or stone.

Left Kidney:  12.2 cm in length.  Pronounced hydroureteronephrosis.
To 1 cm simple appearing cysts.  No mass or stone.

Bladder:  Thick-walled urinary bladder.  Some debris layering
within the bladder.
IMPRESSION: Severe bilateral hydroureteronephrosis.  A few small renal cysts.
No evidence of mass or stone.  Diffusely thick-walled bladder.

## 2013-03-31 ENCOUNTER — Other Ambulatory Visit: Payer: Self-pay | Admitting: Cardiology

## 2013-05-03 ENCOUNTER — Encounter: Payer: Self-pay | Admitting: Cardiology

## 2013-05-03 NOTE — Progress Notes (Signed)
      HPI: FU CAD. Patient had an acute inferior MI in September 2011 due to occlusion of a PL branch. Cath with severe 3-V CAD with TIMI-3 flow in the PL branch. Post MI course c/b acute VSD and taken for emergency CABG x4 with LIMA to the LAD, SVG to the diagonal, SVG to the circumflex marginal, and SVG to the PDA as well as repair of ventricular septal defect in September 2011. Abdominal CT in August of 2012 showed no aneurysm. Last echocardiogram in March 2014 showed an ejection fraction of 45%. No residual VSD. Mild to moderate left atrial enlargement and mild mitral regurgitation. Patient last seen in Feb 2014. Since then, the patient denies any dyspnea on exertion, orthopnea, PND, pedal edema, palpitations, syncope or chest pain.   Current Outpatient Prescriptions  Medication Sig Dispense Refill  . amLODipine (NORVASC) 10 MG tablet take 1 tablet by mouth once daily  30 tablet  6  . carvedilol (COREG) 12.5 MG tablet take 1 tablet by mouth twice a day with food  60 tablet  0  . CRESTOR 40 MG tablet take 1 tablet by mouth once daily  30 tablet  0  . lisinopril (PRINIVIL,ZESTRIL) 10 MG tablet take 1 tablet by mouth once daily  30 tablet  0   No current facility-administered medications for this visit.     Past Medical History  Diagnosis Date  . Acute MI, inferior wall   . Coronary artery disease     severe 3 vessel   . Hypertension   . History of tobacco abuse   . Urethral stricture due to infective diseases classified elsewhere     Past Surgical History  Procedure Laterality Date  . Flexible cystoscopy    . Coronary artery bypass graft      times 4  . Repair of post infarction posterior ventricular septal defect      History   Social History  . Marital Status: Married    Spouse Name: N/A    Number of Children: N/A  . Years of Education: N/A   Occupational History  . Not on file.   Social History Main Topics  . Smoking status: Former Research scientist (life sciences)  . Smokeless tobacco:  Not on file  . Alcohol Use: No  . Drug Use: No  . Sexual Activity:    Other Topics Concern  . Not on file   Social History Narrative  . No narrative on file    ROS: no fevers or chills, productive cough, hemoptysis, dysphasia, odynophagia, melena, hematochezia, dysuria, hematuria, rash, seizure activity, orthopnea, PND, pedal edema, claudication. Remaining systems are negative.  Physical Exam: Well-developed well-nourished in no acute distress.  Skin is warm and dry.  HEENT is normal.  Neck is supple.  Chest is clear to auscultation with normal expansion.  Cardiovascular exam is regular rate and rhythm.  Abdominal exam nontender or distended. No masses palpated. Extremities show no edema. neuro grossly intact  ECG     This encounter was created in error - please disregard.

## 2013-05-07 ENCOUNTER — Other Ambulatory Visit: Payer: Self-pay | Admitting: Cardiology

## 2013-05-11 ENCOUNTER — Telehealth: Payer: Self-pay | Admitting: Cardiology

## 2013-05-11 NOTE — Telephone Encounter (Signed)
05-11-13  I have called 3 times trying to get Dr. Stanford Breed visit rescheduled.  Left messages on home phone, cell phone could not leave messages. ST

## 2013-06-10 ENCOUNTER — Other Ambulatory Visit: Payer: Self-pay | Admitting: *Deleted

## 2013-06-10 MED ORDER — LISINOPRIL 10 MG PO TABS: ORAL_TABLET | ORAL | Status: DC

## 2013-06-10 MED ORDER — AMLODIPINE BESYLATE 10 MG PO TABS: ORAL_TABLET | ORAL | Status: DC

## 2013-06-10 MED ORDER — ROSUVASTATIN CALCIUM 40 MG PO TABS: ORAL_TABLET | ORAL | Status: DC

## 2013-06-10 MED ORDER — CARVEDILOL 12.5 MG PO TABS: ORAL_TABLET | ORAL | Status: DC

## 2013-07-18 ENCOUNTER — Telehealth: Payer: Self-pay | Admitting: Cardiology

## 2013-07-18 NOTE — Telephone Encounter (Signed)
Staying at the [facility].  Need a note saying that I can't climb the 12 flights of stairs to get the the area I'm staying in.  Need a note to use the elevator.  Please fax to [phone].

## 2013-07-19 ENCOUNTER — Encounter: Payer: Self-pay | Admitting: *Deleted

## 2013-07-19 NOTE — Telephone Encounter (Signed)
Ok for letter [name]  

## 2013-07-19 NOTE — Telephone Encounter (Signed)
Will forward for dr Stanford Breed review for approval to write letter

## 2013-07-19 NOTE — Telephone Encounter (Signed)
Letter generated and faxed to the number provided

## 2013-08-04 ENCOUNTER — Encounter: Payer: Self-pay | Admitting: Cardiology

## 2013-08-04 NOTE — Progress Notes (Signed)
      HPI: FU CAD. Previous neck CT with possible dilation of ascending aorta (4.3 cm); thyroid nodule, lung nodule. Patient had an acute inferior MI in September 2011 due to occlusion of a PL branch. Cath with severe 3-V CAD with TIMI-3 flow in the PL branch. Post MI course c/b acute VSD and taken for emergency CABG x4 with LIMA to the LAD, SVG to the diagonal, SVG to the circumflex marginal, and SVG to the PDA as well as repair of ventricular septal defect in September 2011. Echo March 2014, EF 45; VSD patch stable; LAE. Patient last seen in March 2014. Since then, the patient denies any dyspnea on exertion, orthopnea, PND, pedal edema, palpitations, syncope or chest pain.   Current Outpatient Prescriptions  Medication Sig Dispense Refill  . amLODipine (NORVASC) 10 MG tablet take 1 tablet by mouth once daily  30 tablet  1  . carvedilol (COREG) 12.5 MG tablet take 1 tablet by mouth twice a day with food  60 tablet  1  . lisinopril (PRINIVIL,ZESTRIL) 10 MG tablet take 1 tablet by mouth once daily  30 tablet  1  . rosuvastatin (CRESTOR) 40 MG tablet take 1 tablet by mouth once daily  30 tablet  1   No current facility-administered medications for this visit.     Past Medical History  Diagnosis Date  . Acute MI, inferior wall   . Coronary artery disease     severe 3 vessel   . Hypertension   . History of tobacco abuse   . Urethral stricture due to infective diseases classified elsewhere     Past Surgical History  Procedure Laterality Date  . Flexible cystoscopy    . Coronary artery bypass graft      times 4  . Repair of post infarction posterior ventricular septal defect      History   Social History  . Marital Status: Married    Spouse Name: N/A    Number of Children: N/A  . Years of Education: N/A   Occupational History  . Not on file.   Social History Main Topics  . Smoking status: Former Research scientist (life sciences)  . Smokeless tobacco: Not on file  . Alcohol Use: No  . Drug Use: No    . Sexual Activity:    Other Topics Concern  . Not on file   Social History Narrative  . No narrative on file    ROS: no fevers or chills, productive cough, hemoptysis, dysphasia, odynophagia, melena, hematochezia, dysuria, hematuria, rash, seizure activity, orthopnea, PND, pedal edema, claudication. Remaining systems are negative.  Physical Exam: Well-developed well-nourished in no acute distress.  Skin is warm and dry.  HEENT is normal.  Neck is supple.  Chest is clear to auscultation with normal expansion.  Cardiovascular exam is regular rate and rhythm.  Abdominal exam nontender or distended. No masses palpated. Extremities show no edema. neuro grossly intact  ECG     This encounter was created in error - please disregard.

## 2013-08-14 ENCOUNTER — Other Ambulatory Visit: Payer: Self-pay | Admitting: Cardiology

## 2013-09-06 ENCOUNTER — Encounter: Payer: Self-pay | Admitting: Cardiology

## 2013-09-06 ENCOUNTER — Ambulatory Visit (INDEPENDENT_AMBULATORY_CARE_PROVIDER_SITE_OTHER): Payer: Medicare Other | Admitting: Cardiology

## 2013-09-06 VITALS — BP 124/96 | HR 56 | Ht 66.0 in | Wt 209.0 lb

## 2013-09-06 DIAGNOSIS — I2589 Other forms of chronic ischemic heart disease: Secondary | ICD-10-CM

## 2013-09-06 DIAGNOSIS — I2510 Atherosclerotic heart disease of native coronary artery without angina pectoris: Secondary | ICD-10-CM | POA: Diagnosis not present

## 2013-09-06 DIAGNOSIS — E785 Hyperlipidemia, unspecified: Secondary | ICD-10-CM

## 2013-09-06 DIAGNOSIS — Q210 Ventricular septal defect: Secondary | ICD-10-CM | POA: Diagnosis not present

## 2013-09-06 DIAGNOSIS — I255 Ischemic cardiomyopathy: Secondary | ICD-10-CM

## 2013-09-06 LAB — BASIC METABOLIC PANEL WITH GFR
BUN: 38 mg/dL — AB (ref 6–23)
CALCIUM: 9.5 mg/dL (ref 8.4–10.5)
CO2: 21 mEq/L (ref 19–32)
Chloride: 110 mEq/L (ref 96–112)
Creat: 1.7 mg/dL — ABNORMAL HIGH (ref 0.50–1.35)
GFR, EST AFRICAN AMERICAN: 46 mL/min — AB
GFR, Est Non African American: 40 mL/min — ABNORMAL LOW
GLUCOSE: 97 mg/dL (ref 70–99)
Potassium: 4.3 mEq/L (ref 3.5–5.3)
SODIUM: 139 meq/L (ref 135–145)

## 2013-09-06 LAB — HEPATIC FUNCTION PANEL
ALT: 9 U/L (ref 0–53)
AST: 13 U/L (ref 0–37)
Albumin: 3.8 g/dL (ref 3.5–5.2)
Alkaline Phosphatase: 72 U/L (ref 39–117)
BILIRUBIN INDIRECT: 0.2 mg/dL (ref 0.2–1.2)
Bilirubin, Direct: 0.1 mg/dL (ref 0.0–0.3)
Total Bilirubin: 0.3 mg/dL (ref 0.2–1.2)
Total Protein: 7.8 g/dL (ref 6.0–8.3)

## 2013-09-06 LAB — LIPID PANEL
CHOL/HDL RATIO: 3.1 ratio
CHOLESTEROL: 84 mg/dL (ref 0–200)
HDL: 27 mg/dL — ABNORMAL LOW (ref >39)
LDL Cholesterol: 34 mg/dL (ref 0–99)
Triglycerides: 115 mg/dL (ref <150)
VLDL: 23 mg/dL (ref 0–40)

## 2013-09-06 NOTE — Assessment & Plan Note (Signed)
No evidence of residual ventricular septal defect on last echo.

## 2013-09-06 NOTE — Assessment & Plan Note (Signed)
Diastolic blood pressure is mildly elevated but he has not taken his medications yet this morning. Continue present medications. Follow blood pressure. Check potassium and renal function.

## 2013-09-06 NOTE — Patient Instructions (Signed)
Your physician wants you to follow-up in: ONE YEAR WITH DR CRENSHAW You will receive a reminder letter in the mail two months in advance. If you don't receive a letter, please call our office to schedule the follow-up appointment.   Your physician recommends that you HAVE LAB WORK TODAY 

## 2013-09-06 NOTE — Assessment & Plan Note (Signed)
Continue ACE inhibitor and beta blocker. 

## 2013-09-06 NOTE — Assessment & Plan Note (Signed)
Continue aspirin and statin. 

## 2013-09-06 NOTE — Assessment & Plan Note (Signed)
Continue statin. Check lipids and liver. 

## 2013-09-06 NOTE — Progress Notes (Signed)
      HPI: FU CAD. Patient had an acute inferior MI in September 2011 due to occlusion of a PL branch. Cath with severe 3-V CAD with TIMI-3 flow in the PL branch. Post MI course c/b acute VSD and taken for emergency CABG x4 with LIMA to the LAD, SVG to the diagonal, SVG to the circumflex marginal, and SVG to the PDA as well as repair of ventricular septal defect in September 2011. Echo 3/14 showed EF 45%, LAE, mild MR and no residual VSD. Patient last seen in Feb 2014. Since then, the patient denies any dyspnea on exertion, orthopnea, PND, pedal edema, palpitations, syncope or chest pain.   Current Outpatient Prescriptions  Medication Sig Dispense Refill  . amLODipine (NORVASC) 10 MG tablet take 1 tablet by mouth once daily *MUST KEEP 08/04/13 APPT FOR FURTHER REFILLS  30 tablet  0  . carvedilol (COREG) 12.5 MG tablet take 1 tablet by mouth twice a day with food *MUST KEEP 08/04/13 APPT FOR FURTHER REFILLS  60 tablet  0  . CRESTOR 40 MG tablet take 1 tablet by mouth once daily *MUST KEEP 08/04/13 APPT FOR FURTHER REFILLS  30 tablet  0  . lisinopril (PRINIVIL,ZESTRIL) 10 MG tablet take 1 tablet by mouth once daily *MUST KEEP 08/04/13 APPT FOR FURTHER REFILLS  30 tablet  0   No current facility-administered medications for this visit.     Past Medical History  Diagnosis Date  . Acute MI, inferior wall   . Coronary artery disease     severe 3 vessel   . Hypertension   . History of tobacco abuse   . Urethral stricture due to infective diseases classified elsewhere     Past Surgical History  Procedure Laterality Date  . Flexible cystoscopy    . Coronary artery bypass graft      times 4  . Repair of post infarction posterior ventricular septal defect      History   Social History  . Marital Status: Married    Spouse Name: N/A    Number of Children: N/A  . Years of Education: N/A   Occupational History  . Not on file.   Social History Main Topics  . Smoking status: Former Research scientist (life sciences)    . Smokeless tobacco: Not on file  . Alcohol Use: No  . Drug Use: No  . Sexual Activity:    Other Topics Concern  . Not on file   Social History Narrative  . No narrative on file    ROS: no fevers or chills, productive cough, hemoptysis, dysphasia, odynophagia, melena, hematochezia, dysuria, hematuria, rash, seizure activity, orthopnea, PND, pedal edema, claudication. Remaining systems are negative.  Physical Exam: Well-developed well-nourished in no acute distress.  Skin is warm and dry.  HEENT is normal.  Neck is supple.  Chest is clear to auscultation with normal expansion.  Cardiovascular exam is regular rate and rhythm.  Abdominal exam nontender or distended. No masses palpated. Extremities show no edema. neuro grossly intact  ECG Sinus rhythm at a rate of 56. Prior inferior infarct. First degree AV block. Inferior lateral T-wave inversion.

## 2013-09-07 ENCOUNTER — Encounter: Payer: Self-pay | Admitting: *Deleted

## 2013-09-18 ENCOUNTER — Other Ambulatory Visit: Payer: Self-pay | Admitting: Cardiology
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
## 2014-09-01 NOTE — Progress Notes (Signed)
      HPI: FU CAD. Patient had an acute inferior MI in September 2011 due to occlusion of a PL branch. Cath with severe 3-V CAD with TIMI-3 flow in the PL branch. Post MI course c/b acute VSD and taken for emergency CABG x4 with LIMA to the LAD, SVG to the diagonal, SVG to the circumflex marginal, and SVG to the PDA as well as repair of ventricular septal defect in September 2011. Echo 3/14 showed EF 45%, LAE, mild MR and no residual VSD. Since last seen,   Current Outpatient Prescriptions  Medication Sig Dispense Refill  . amLODipine (NORVASC) 10 MG tablet take 1 tablet by mouth once daily 30 tablet 10  . carvedilol (COREG) 12.5 MG tablet take 1 tablet by mouth twice a day with food 60 tablet 10  . CRESTOR 40 MG tablet take 1 tablet by mouth once daily 30 tablet 10  . lisinopril (PRINIVIL,ZESTRIL) 10 MG tablet take 1 tablet by mouth once daily 30 tablet 10   No current facility-administered medications for this visit.     Past Medical History  Diagnosis Date  . Acute MI, inferior wall   . Coronary artery disease     severe 3 vessel   . Hypertension   . History of tobacco abuse   . Urethral stricture due to infective diseases classified elsewhere     Past Surgical History  Procedure Laterality Date  . Flexible cystoscopy    . Coronary artery bypass graft      times 4  . Repair of post infarction posterior ventricular septal defect      Social History   Social History  . Marital Status: Married    Spouse Name: N/A  . Number of Children: N/A  . Years of Education: N/A   Occupational History  . Not on file.   Social History Main Topics  . Smoking status: Former Research scientist (life sciences)  . Smokeless tobacco: Not on file  . Alcohol Use: No  . Drug Use: No  . Sexual Activity: Not on file   Other Topics Concern  . Not on file   Social History Narrative    ROS: no fevers or chills, productive cough, hemoptysis, dysphasia, odynophagia, melena, hematochezia, dysuria, hematuria, rash,  seizure activity, orthopnea, PND, pedal edema, claudication. Remaining systems are negative.  Physical Exam: Well-developed well-nourished in no acute distress.  Skin is warm and dry.  HEENT is normal.  Neck is supple.  Chest is clear to auscultation with normal expansion.  Cardiovascular exam is regular rate and rhythm.  Abdominal exam nontender or distended. No masses palpated. Extremities show no edema. neuro grossly intact  ECG     This encounter was created in error - please disregard.

## 2014-09-06 ENCOUNTER — Encounter: Payer: Self-pay | Admitting: Cardiology

## 2014-09-23 ENCOUNTER — Other Ambulatory Visit: Payer: Self-pay | Admitting: Cardiology

## 2014-09-25 ENCOUNTER — Other Ambulatory Visit: Payer: Self-pay

## 2014-09-25 MED ORDER — LISINOPRIL 10 MG PO TABS: 10.0000 mg | ORAL_TABLET | Freq: Every day | ORAL | Status: DC

## 2014-09-25 MED ORDER — ROSUVASTATIN CALCIUM 40 MG PO TABS: 40.0000 mg | ORAL_TABLET | Freq: Every day | ORAL | Status: DC

## 2014-09-25 MED ORDER — CARVEDILOL 12.5 MG PO TABS: 12.5000 mg | ORAL_TABLET | Freq: Two times a day (BID) | ORAL | Status: DC

## 2014-09-25 MED ORDER — AMLODIPINE BESYLATE 10 MG PO TABS: 10.0000 mg | ORAL_TABLET | Freq: Every day | ORAL | Status: DC

## 2014-09-25 NOTE — Telephone Encounter (Signed)
Donald Perla, MD at 09/06/2013 7:13 AM  amLODipine (NORVASC) 10 MG tablettake 1 tablet by mouth once daily *MUST KEEP 08/04/13 APPT FOR FURTHER REFILL carvedilol (COREG) 12.5 MG tablet take 1 tablet by mouth twice a day with food *MUST KEEP 08/04/13 APPT FOR FURTHER REFILLS CRESTOR 40 MG tablettake 1 tablet by mouth once daily *MUST KEEP 08/04/13 APPT FOR FURTHER REFILLS lisinopril (PRINIVIL,ZESTRIL) 10 MG tablet take 1 tablet by mouth once daily *MUST KEEP 08/04/13 APPT FOR FURTHER REFILLS   Patient Instructions     Your physician wants you to follow-up in: [place] will receive a reminder letter in the mail two months in advance. If you don't receive a letter, please call our office to schedule the follow-up appointment.   Your physician recommends that you HAVE LAB WORK TODAY

## 2014-11-02 ENCOUNTER — Other Ambulatory Visit: Payer: Self-pay | Admitting: Cardiology

## 2014-11-05 NOTE — Progress Notes (Signed)
      HPI: FU CAD. Patient had an acute inferior MI in September 2011 due to occlusion of a PL branch. Cath with severe 3-V CAD with TIMI-3 flow in the PL branch. Post MI course c/b acute VSD and taken for emergency CABG x4 with LIMA to the LAD, SVG to the diagonal, SVG to the circumflex marginal, and SVG to the PDA as well as repair of ventricular septal defect in September 2011. Echo 3/14 showed EF 45%, LAE, mild MR and no residual VSD. Patient last seen in June 2015. Since then, the patient denies any dyspnea on exertion, orthopnea, PND, pedal edema, palpitations, syncope or chest pain.   Current Outpatient Prescriptions  Medication Sig Dispense Refill  . amLODipine (NORVASC) 10 MG tablet take 1 tablet by mouth once daily 30 tablet 0  . carvedilol (COREG) 12.5 MG tablet take 1 tablet by mouth twice a day with meals 60 tablet 0  . CRESTOR 40 MG tablet take 1 tablet by mouth once daily 30 tablet 1  . lisinopril (PRINIVIL,ZESTRIL) 10 MG tablet take 1 tablet by mouth once daily 30 tablet 0   No current facility-administered medications for this visit.     Past Medical History  Diagnosis Date  . Acute MI, inferior wall ([employer])   . Coronary artery disease     severe 3 vessel   . Hypertension   . History of tobacco abuse   . Urethral stricture due to infective diseases classified elsewhere     Past Surgical History  Procedure Laterality Date  . Flexible cystoscopy    . Coronary artery bypass graft      times 4  . Repair of post infarction posterior ventricular septal defect      Social History   Social History  . Marital Status: Married    Spouse Name: N/A  . Number of Children: N/A  . Years of Education: N/A   Occupational History  . Not on file.   Social History Main Topics  . Smoking status: Former Research scientist (life sciences)  . Smokeless tobacco: Not on file  . Alcohol Use: No  . Drug Use: No  . Sexual Activity: Not on file   Other Topics Concern  . Not on file   Social History  Narrative    ROS: no fevers or chills, productive cough, hemoptysis, dysphasia, odynophagia, melena, hematochezia, dysuria, hematuria, rash, seizure activity, orthopnea, PND, pedal edema, claudication. Remaining systems are negative.  Physical Exam: Well-developed well-nourished in no acute distress.  Skin is warm and dry.  HEENT is normal.  Neck is supple.  Chest is clear to auscultation with normal expansion.  Cardiovascular exam is regular rate and rhythm.  Abdominal exam nontender or distended. No masses palpated. Extremities show no edema. neuro grossly intact  ECG Sinus rhythm, first-degree AV block,Inferior infarct with inferior lateral T-wave inversion.

## 2014-11-06 ENCOUNTER — Ambulatory Visit (INDEPENDENT_AMBULATORY_CARE_PROVIDER_SITE_OTHER): Payer: Medicare Other | Admitting: Cardiology

## 2014-11-06 ENCOUNTER — Encounter: Payer: Self-pay | Admitting: Cardiology

## 2014-11-06 VITALS — BP 140/62 | HR 67 | Ht 66.0 in | Wt 212.2 lb

## 2014-11-06 DIAGNOSIS — I10 Essential (primary) hypertension: Secondary | ICD-10-CM

## 2014-11-06 DIAGNOSIS — I255 Ischemic cardiomyopathy: Secondary | ICD-10-CM

## 2014-11-06 DIAGNOSIS — Q210 Ventricular septal defect: Secondary | ICD-10-CM

## 2014-11-06 DIAGNOSIS — I25810 Atherosclerosis of coronary artery bypass graft(s) without angina pectoris: Secondary | ICD-10-CM

## 2014-11-06 MED ORDER — ASPIRIN EC 81 MG PO TBEC: 81.0000 mg | DELAYED_RELEASE_TABLET | Freq: Every day | ORAL | Status: DC

## 2014-11-06 NOTE — Assessment & Plan Note (Signed)
Blood pressure controlled. Continue present medications. 

## 2014-11-06 NOTE — Assessment & Plan Note (Signed)
Continue statin. Check lipids and liver. 

## 2014-11-06 NOTE — Assessment & Plan Note (Signed)
Continue ACE inhibitor and beta blocker. Check potassium and renal function.

## 2014-11-06 NOTE — Assessment & Plan Note (Signed)
Continue statin. I have asked Patient to take aspirin 81 mg daily.

## 2014-11-06 NOTE — Assessment & Plan Note (Signed)
Most recent echocardiogram showed no residual defect.

## 2014-11-06 NOTE — Patient Instructions (Signed)
Medication Instructions:   START ASPIRIN 81 MG ONCE DAILY WITH FOOD  Labwork:  Your physician recommends that you return for lab work PRIOR TO EATING  Follow-Up:  Your physician wants you to follow-up in: [place] will receive a reminder letter in the mail two months in advance. If you don't receive a letter, please call our office to schedule the follow-up appointment.   If you need a refill on your cardiac medications before your next appointment, please call your pharmacy.

## 2014-11-30 ENCOUNTER — Other Ambulatory Visit: Payer: Self-pay | Admitting: Cardiology

## 2014-11-30 NOTE — Telephone Encounter (Signed)
REFILL 

## 2015-01-24 ENCOUNTER — Emergency Department (HOSPITAL_COMMUNITY): Payer: Medicare Other

## 2015-01-24 ENCOUNTER — Inpatient Hospital Stay (HOSPITAL_COMMUNITY)
Admission: EM | Admit: 2015-01-24 | Discharge: 2015-01-30 | DRG: 683 | Disposition: A | Discharge status: Home Health | Payer: Medicare Other | Attending: Internal Medicine | Admitting: Internal Medicine

## 2015-01-24 ENCOUNTER — Encounter (HOSPITAL_COMMUNITY): Payer: Self-pay | Admitting: Emergency Medicine

## 2015-01-24 DIAGNOSIS — W1839XA Other fall on same level, initial encounter: Secondary | ICD-10-CM | POA: Diagnosis present

## 2015-01-24 DIAGNOSIS — E872 Acidosis, unspecified: Secondary | ICD-10-CM

## 2015-01-24 DIAGNOSIS — Z955 Presence of coronary angioplasty implant and graft: Secondary | ICD-10-CM

## 2015-01-24 DIAGNOSIS — I5022 Chronic systolic (congestive) heart failure: Secondary | ICD-10-CM | POA: Diagnosis present

## 2015-01-24 DIAGNOSIS — D473 Essential (hemorrhagic) thrombocythemia: Secondary | ICD-10-CM | POA: Diagnosis present

## 2015-01-24 DIAGNOSIS — N183 Chronic kidney disease, stage 3 unspecified: Secondary | ICD-10-CM | POA: Diagnosis present

## 2015-01-24 DIAGNOSIS — E878 Other disorders of electrolyte and fluid balance, not elsewhere classified: Secondary | ICD-10-CM | POA: Diagnosis present

## 2015-01-24 DIAGNOSIS — E875 Hyperkalemia: Secondary | ICD-10-CM | POA: Diagnosis not present

## 2015-01-24 DIAGNOSIS — E8729 Other acidosis: Secondary | ICD-10-CM | POA: Diagnosis present

## 2015-01-24 DIAGNOSIS — N139 Obstructive and reflux uropathy, unspecified: Secondary | ICD-10-CM | POA: Diagnosis present

## 2015-01-24 DIAGNOSIS — A599 Trichomoniasis, unspecified: Secondary | ICD-10-CM | POA: Diagnosis present

## 2015-01-24 DIAGNOSIS — N1330 Unspecified hydronephrosis: Secondary | ICD-10-CM | POA: Insufficient documentation

## 2015-01-24 DIAGNOSIS — N390 Urinary tract infection, site not specified: Secondary | ICD-10-CM | POA: Diagnosis not present

## 2015-01-24 DIAGNOSIS — N320 Bladder-neck obstruction: Secondary | ICD-10-CM | POA: Diagnosis present

## 2015-01-24 DIAGNOSIS — N179 Acute kidney failure, unspecified: Principal | ICD-10-CM | POA: Diagnosis present

## 2015-01-24 DIAGNOSIS — R109 Unspecified abdominal pain: Secondary | ICD-10-CM | POA: Diagnosis present

## 2015-01-24 DIAGNOSIS — R310 Gross hematuria: Secondary | ICD-10-CM | POA: Diagnosis present

## 2015-01-24 DIAGNOSIS — I252 Old myocardial infarction: Secondary | ICD-10-CM

## 2015-01-24 DIAGNOSIS — E46 Unspecified protein-calorie malnutrition: Secondary | ICD-10-CM | POA: Insufficient documentation

## 2015-01-24 DIAGNOSIS — D631 Anemia in chronic kidney disease: Secondary | ICD-10-CM | POA: Diagnosis present

## 2015-01-24 DIAGNOSIS — Z7982 Long term (current) use of aspirin: Secondary | ICD-10-CM

## 2015-01-24 DIAGNOSIS — E785 Hyperlipidemia, unspecified: Secondary | ICD-10-CM | POA: Diagnosis present

## 2015-01-24 DIAGNOSIS — I25810 Atherosclerosis of coronary artery bypass graft(s) without angina pectoris: Secondary | ICD-10-CM | POA: Diagnosis present

## 2015-01-24 DIAGNOSIS — M25552 Pain in left hip: Secondary | ICD-10-CM | POA: Diagnosis not present

## 2015-01-24 DIAGNOSIS — D75839 Thrombocytosis, unspecified: Secondary | ICD-10-CM | POA: Diagnosis present

## 2015-01-24 DIAGNOSIS — Z951 Presence of aortocoronary bypass graft: Secondary | ICD-10-CM

## 2015-01-24 DIAGNOSIS — Q210 Ventricular septal defect: Secondary | ICD-10-CM

## 2015-01-24 DIAGNOSIS — Z79899 Other long term (current) drug therapy: Secondary | ICD-10-CM

## 2015-01-24 DIAGNOSIS — D649 Anemia, unspecified: Secondary | ICD-10-CM | POA: Insufficient documentation

## 2015-01-24 DIAGNOSIS — Z87891 Personal history of nicotine dependence: Secondary | ICD-10-CM

## 2015-01-24 DIAGNOSIS — I129 Hypertensive chronic kidney disease with stage 1 through stage 4 chronic kidney disease, or unspecified chronic kidney disease: Secondary | ICD-10-CM | POA: Diagnosis present

## 2015-01-24 HISTORY — DX: Acute kidney failure, unspecified: N17.9

## 2015-01-24 NOTE — ED Notes (Signed)
Pt states he fell this morning onto a carpeted floor after his feet got hung up in the bed covers  Pt is c/o pain to his left side from his flank area to his thigh  Pt states it is hard for him to walk   Pt states he also has some acid reflux "it hurts everytime I belch"

## 2015-01-24 NOTE — ED Notes (Signed)
Pt states he has been having urinary frequency for about a month  Pt states if he drinks anything it goes straight through him

## 2015-01-25 ENCOUNTER — Inpatient Hospital Stay (HOSPITAL_COMMUNITY): Payer: Medicare Other

## 2015-01-25 DIAGNOSIS — Z955 Presence of coronary angioplasty implant and graft: Secondary | ICD-10-CM | POA: Diagnosis not present

## 2015-01-25 DIAGNOSIS — E785 Hyperlipidemia, unspecified: Secondary | ICD-10-CM | POA: Diagnosis present

## 2015-01-25 DIAGNOSIS — R109 Unspecified abdominal pain: Secondary | ICD-10-CM | POA: Diagnosis not present

## 2015-01-25 DIAGNOSIS — N138 Other obstructive and reflux uropathy: Secondary | ICD-10-CM | POA: Diagnosis not present

## 2015-01-25 DIAGNOSIS — E872 Acidosis: Secondary | ICD-10-CM | POA: Diagnosis not present

## 2015-01-25 DIAGNOSIS — Z87891 Personal history of nicotine dependence: Secondary | ICD-10-CM | POA: Diagnosis not present

## 2015-01-25 DIAGNOSIS — I129 Hypertensive chronic kidney disease with stage 1 through stage 4 chronic kidney disease, or unspecified chronic kidney disease: Secondary | ICD-10-CM | POA: Diagnosis present

## 2015-01-25 DIAGNOSIS — D75839 Thrombocytosis, unspecified: Secondary | ICD-10-CM | POA: Diagnosis present

## 2015-01-25 DIAGNOSIS — N183 Chronic kidney disease, stage 3 unspecified: Secondary | ICD-10-CM | POA: Diagnosis present

## 2015-01-25 DIAGNOSIS — D473 Essential (hemorrhagic) thrombocythemia: Secondary | ICD-10-CM | POA: Diagnosis present

## 2015-01-25 DIAGNOSIS — N320 Bladder-neck obstruction: Secondary | ICD-10-CM | POA: Diagnosis not present

## 2015-01-25 DIAGNOSIS — E875 Hyperkalemia: Secondary | ICD-10-CM | POA: Diagnosis present

## 2015-01-25 DIAGNOSIS — R358 Other polyuria: Secondary | ICD-10-CM | POA: Diagnosis not present

## 2015-01-25 DIAGNOSIS — I25810 Atherosclerosis of coronary artery bypass graft(s) without angina pectoris: Secondary | ICD-10-CM | POA: Diagnosis present

## 2015-01-25 DIAGNOSIS — R1012 Left upper quadrant pain: Secondary | ICD-10-CM | POA: Diagnosis not present

## 2015-01-25 DIAGNOSIS — Z79899 Other long term (current) drug therapy: Secondary | ICD-10-CM | POA: Diagnosis not present

## 2015-01-25 DIAGNOSIS — A599 Trichomoniasis, unspecified: Secondary | ICD-10-CM | POA: Diagnosis present

## 2015-01-25 DIAGNOSIS — N1330 Unspecified hydronephrosis: Secondary | ICD-10-CM | POA: Diagnosis not present

## 2015-01-25 DIAGNOSIS — N390 Urinary tract infection, site not specified: Secondary | ICD-10-CM | POA: Diagnosis present

## 2015-01-25 DIAGNOSIS — N179 Acute kidney failure, unspecified: Secondary | ICD-10-CM | POA: Diagnosis not present

## 2015-01-25 DIAGNOSIS — J8489 Other specified interstitial pulmonary diseases: Secondary | ICD-10-CM | POA: Diagnosis not present

## 2015-01-25 DIAGNOSIS — Z7982 Long term (current) use of aspirin: Secondary | ICD-10-CM | POA: Diagnosis not present

## 2015-01-25 DIAGNOSIS — Z951 Presence of aortocoronary bypass graft: Secondary | ICD-10-CM | POA: Diagnosis not present

## 2015-01-25 DIAGNOSIS — N139 Obstructive and reflux uropathy, unspecified: Secondary | ICD-10-CM | POA: Diagnosis not present

## 2015-01-25 DIAGNOSIS — I5022 Chronic systolic (congestive) heart failure: Secondary | ICD-10-CM | POA: Diagnosis not present

## 2015-01-25 DIAGNOSIS — N3289 Other specified disorders of bladder: Secondary | ICD-10-CM | POA: Diagnosis not present

## 2015-01-25 DIAGNOSIS — N185 Chronic kidney disease, stage 5: Secondary | ICD-10-CM | POA: Diagnosis not present

## 2015-01-25 DIAGNOSIS — R310 Gross hematuria: Secondary | ICD-10-CM | POA: Diagnosis present

## 2015-01-25 DIAGNOSIS — I252 Old myocardial infarction: Secondary | ICD-10-CM | POA: Diagnosis not present

## 2015-01-25 DIAGNOSIS — N131 Hydronephrosis with ureteral stricture, not elsewhere classified: Secondary | ICD-10-CM | POA: Diagnosis not present

## 2015-01-25 DIAGNOSIS — D631 Anemia in chronic kidney disease: Secondary | ICD-10-CM | POA: Diagnosis present

## 2015-01-25 DIAGNOSIS — E8729 Other acidosis: Secondary | ICD-10-CM | POA: Diagnosis present

## 2015-01-25 DIAGNOSIS — E878 Other disorders of electrolyte and fluid balance, not elsewhere classified: Secondary | ICD-10-CM | POA: Diagnosis present

## 2015-01-25 DIAGNOSIS — W1839XA Other fall on same level, initial encounter: Secondary | ICD-10-CM | POA: Diagnosis present

## 2015-01-25 LAB — I-STAT CHEM 8, ED
BUN: 57 mg/dL — AB (ref 6–20)
CALCIUM ION: 1.3 mmol/L (ref 1.13–1.30)
CHLORIDE: 117 mmol/L — AB (ref 101–111)
Creatinine, Ser: 6.1 mg/dL — ABNORMAL HIGH (ref 0.61–1.24)
Glucose, Bld: 98 mg/dL (ref 65–99)
HEMATOCRIT: 33 % — AB (ref 39.0–52.0)
Hemoglobin: 11.2 g/dL — ABNORMAL LOW (ref 13.0–17.0)
POTASSIUM: 6.1 mmol/L — AB (ref 3.5–5.1)
Sodium: 141 mmol/L (ref 135–145)
TCO2: 15 mmol/L (ref 0–100)

## 2015-01-25 LAB — CBC WITH DIFFERENTIAL/PLATELET
BASOS PCT: 0 %
Basophils Absolute: 0 10*3/uL (ref 0.0–0.1)
EOS ABS: 0.1 10*3/uL (ref 0.0–0.7)
EOS PCT: 2 %
HCT: 29.8 % — ABNORMAL LOW (ref 39.0–52.0)
Hemoglobin: 9.5 g/dL — ABNORMAL LOW (ref 13.0–17.0)
LYMPHS ABS: 2.9 10*3/uL (ref 0.7–4.0)
Lymphocytes Relative: 33 %
MCH: 26.7 pg (ref 26.0–34.0)
MCHC: 31.9 g/dL (ref 30.0–36.0)
MCV: 83.7 fL (ref 78.0–100.0)
MONO ABS: 0.5 10*3/uL (ref 0.1–1.0)
MONOS PCT: 6 %
Neutro Abs: 5.4 10*3/uL (ref 1.7–7.7)
Neutrophils Relative %: 59 %
Platelets: 482 10*3/uL — ABNORMAL HIGH (ref 150–400)
RBC: 3.56 MIL/uL — ABNORMAL LOW (ref 4.22–5.81)
RDW: 16.1 % — AB (ref 11.5–15.5)
WBC: 9 10*3/uL (ref 4.0–10.5)

## 2015-01-25 LAB — RENAL FUNCTION PANEL
ALBUMIN: 2.9 g/dL — AB (ref 3.5–5.0)
ANION GAP: 10 (ref 5–15)
BUN: 64 mg/dL — ABNORMAL HIGH (ref 6–20)
CO2: 16 mmol/L — ABNORMAL LOW (ref 22–32)
Calcium: 9.5 mg/dL (ref 8.9–10.3)
Chloride: 117 mmol/L — ABNORMAL HIGH (ref 101–111)
Creatinine, Ser: 5.91 mg/dL — ABNORMAL HIGH (ref 0.61–1.24)
GFR calc non Af Amer: 9 mL/min — ABNORMAL LOW (ref >60)
GFR, EST AFRICAN AMERICAN: 10 mL/min — AB (ref >60)
GLUCOSE: 107 mg/dL — AB (ref 65–99)
PHOSPHORUS: 5.7 mg/dL — AB (ref 2.5–4.6)
POTASSIUM: 6.3 mmol/L — AB (ref 3.5–5.1)
Sodium: 143 mmol/L (ref 135–145)

## 2015-01-25 LAB — URINALYSIS, ROUTINE W REFLEX MICROSCOPIC
BILIRUBIN URINE: NEGATIVE
GLUCOSE, UA: NEGATIVE mg/dL
Ketones, ur: NEGATIVE mg/dL
Nitrite: NEGATIVE
PH: 6.5 (ref 5.0–8.0)
Protein, ur: 100 mg/dL — AB
SPECIFIC GRAVITY, URINE: 1.007 (ref 1.005–1.030)

## 2015-01-25 LAB — URINE MICROSCOPIC-ADD ON: RBC / HPF: NONE SEEN RBC/hpf (ref 0–5)

## 2015-01-25 LAB — BASIC METABOLIC PANEL
Anion gap: 11 (ref 5–15)
BUN: 61 mg/dL — AB (ref 6–20)
CALCIUM: 9.9 mg/dL (ref 8.9–10.3)
CO2: 15 mmol/L — ABNORMAL LOW (ref 22–32)
CREATININE: 6.16 mg/dL — AB (ref 0.61–1.24)
Chloride: 114 mmol/L — ABNORMAL HIGH (ref 101–111)
GFR calc non Af Amer: 8 mL/min — ABNORMAL LOW (ref >60)
GFR, EST AFRICAN AMERICAN: 9 mL/min — AB (ref >60)
Glucose, Bld: 103 mg/dL — ABNORMAL HIGH (ref 65–99)
Potassium: 6.2 mmol/L (ref 3.5–5.1)
SODIUM: 140 mmol/L (ref 135–145)

## 2015-01-25 LAB — I-STAT TROPONIN, ED: TROPONIN I, POC: 0 ng/mL (ref 0.00–0.08)

## 2015-01-25 LAB — SODIUM, URINE, RANDOM: Sodium, Ur: 50 mmol/L

## 2015-01-25 LAB — CREATININE, URINE, RANDOM: Creatinine, Urine: 39.04 mg/dL

## 2015-01-25 LAB — GC/CHLAMYDIA PROBE AMP (~~LOC~~) NOT AT ARMC
CHLAMYDIA, DNA PROBE: NEGATIVE
NEISSERIA GONORRHEA: NEGATIVE

## 2015-01-25 MED ORDER — SODIUM CHLORIDE 0.9 % IV BOLUS (SEPSIS)
1000.0000 mL | Freq: Once | INTRAVENOUS | Status: AC
  Administered 2015-01-25: 1000 mL via INTRAVENOUS

## 2015-01-25 MED ORDER — SODIUM POLYSTYRENE SULFONATE 15 GM/60ML PO SUSP
30.0000 g | Freq: Once | ORAL | Status: AC
  Administered 2015-01-25: 30 g via ORAL
  Filled 2015-01-25: qty 120

## 2015-01-25 MED ORDER — AMLODIPINE BESYLATE 10 MG PO TABS
10.0000 mg | ORAL_TABLET | Freq: Every day | ORAL | Status: DC
  Administered 2015-01-25 – 2015-01-29 (×5): 10 mg via ORAL
  Filled 2015-01-25 (×6): qty 1

## 2015-01-25 MED ORDER — METRONIDAZOLE 500 MG PO TABS
2000.0000 mg | ORAL_TABLET | Freq: Once | ORAL | Status: AC
  Administered 2015-01-25: 2000 mg via ORAL
  Filled 2015-01-25: qty 4

## 2015-01-25 MED ORDER — CEFTRIAXONE SODIUM 1 G IJ SOLR
1.0000 g | INTRAMUSCULAR | Status: DC
  Administered 2015-01-25 – 2015-01-30 (×6): 1 g via INTRAVENOUS
  Filled 2015-01-25 (×6): qty 10

## 2015-01-25 MED ORDER — STERILE WATER FOR INJECTION IV SOLN
INTRAVENOUS | Status: DC
  Administered 2015-01-25 – 2015-01-26 (×2): via INTRAVENOUS
  Filled 2015-01-25 (×2): qty 850

## 2015-01-25 MED ORDER — ROSUVASTATIN CALCIUM 40 MG PO TABS
40.0000 mg | ORAL_TABLET | Freq: Every day | ORAL | Status: DC
  Administered 2015-01-25 – 2015-01-30 (×6): 40 mg via ORAL
  Filled 2015-01-25 (×6): qty 1

## 2015-01-25 MED ORDER — SODIUM CHLORIDE 0.9 % IJ SOLN
3.0000 mL | Freq: Two times a day (BID) | INTRAMUSCULAR | Status: DC
  Administered 2015-01-26 – 2015-01-30 (×4): 3 mL via INTRAVENOUS

## 2015-01-25 MED ORDER — LISINOPRIL 10 MG PO TABS: 10.0000 mg | ORAL_TABLET | Freq: Every day | ORAL | Status: DC

## 2015-01-25 MED ORDER — ASPIRIN EC 81 MG PO TBEC
81.0000 mg | DELAYED_RELEASE_TABLET | Freq: Every day | ORAL | Status: DC
  Administered 2015-01-25 – 2015-01-30 (×6): 81 mg via ORAL
  Filled 2015-01-25 (×6): qty 1

## 2015-01-25 MED ORDER — HEPARIN SODIUM (PORCINE) 5000 UNIT/ML IJ SOLN
5000.0000 [IU] | Freq: Three times a day (TID) | INTRAMUSCULAR | Status: DC
  Administered 2015-01-25 – 2015-01-30 (×14): 5000 [IU] via SUBCUTANEOUS
  Filled 2015-01-25 (×18): qty 1

## 2015-01-25 MED ORDER — DEXTROSE 50 % IV SOLN
1.0000 | Freq: Once | INTRAVENOUS | Status: AC
  Administered 2015-01-25: 50 mL via INTRAVENOUS
  Filled 2015-01-25: qty 50

## 2015-01-25 MED ORDER — ONDANSETRON HCL 4 MG/2ML IJ SOLN: 4.0000 mg | Freq: Four times a day (QID) | INTRAMUSCULAR | Status: DC | PRN

## 2015-01-25 MED ORDER — ONDANSETRON HCL 4 MG PO TABS: 4.0000 mg | ORAL_TABLET | Freq: Four times a day (QID) | ORAL | Status: DC | PRN

## 2015-01-25 MED ORDER — INSULIN ASPART 100 UNIT/ML ~~LOC~~ SOLN
5.0000 [IU] | Freq: Once | SUBCUTANEOUS | Status: AC
  Administered 2015-01-25: 5 [IU] via INTRAVENOUS
  Filled 2015-01-25: qty 1

## 2015-01-25 MED ORDER — CARVEDILOL 12.5 MG PO TABS
12.5000 mg | ORAL_TABLET | Freq: Two times a day (BID) | ORAL | Status: DC
  Administered 2015-01-25 – 2015-01-30 (×10): 12.5 mg via ORAL
  Filled 2015-01-25 (×12): qty 1

## 2015-01-25 MED ORDER — AZITHROMYCIN 250 MG PO TABS
1000.0000 mg | ORAL_TABLET | Freq: Once | ORAL | Status: AC
  Administered 2015-01-25: 1000 mg via ORAL
  Filled 2015-01-25: qty 4

## 2015-01-25 MED ORDER — DEXTROSE 5 % IV SOLN
1.0000 g | Freq: Once | INTRAVENOUS | Status: AC
  Administered 2015-01-25: 1 g via INTRAVENOUS
  Filled 2015-01-25: qty 10

## 2015-01-25 MED ORDER — ALBUTEROL SULFATE (2.5 MG/3ML) 0.083% IN NEBU
5.0000 mg | INHALATION_SOLUTION | Freq: Once | RESPIRATORY_TRACT | Status: AC
  Administered 2015-01-25: 5 mg via RESPIRATORY_TRACT
  Filled 2015-01-25: qty 6

## 2015-01-25 NOTE — Consult Note (Signed)
The patient has a history of urethral stricture and had urethral dilation by Dr. Gaynelle Arabian in September 2011 around time of open heart surgery.  He did not follow up with urology.  In August 2012 he had back pain and bilateral hydro by imaging tests, creat was 1.9; he underwent cysto under anesthesia and repeat urethral dilatation.  On 02/19/12 creat was 1.4, on 09/06/13 creat was 1.7.  He presented again to ED with hip pain on left after a fall and creat was 6.16, bicarb 15 and K 6.2.  Renal ultrasound today reveals severe chronic hydronephrosis, 13.6 cm right and 12.0cm left with bil cortical thinning and bladder wall thickening.  There was little change from 2012 ultrasound. He denies back pain.  He denies spraying with urination.  He does c/o nocturia x 4 and urinary incontinence while sleeping.  He was on an ACE-I PTA and it has been discontinued.  He denies uremic symptoms.   Past Medical History  Diagnosis Date  . Acute MI, inferior wall ([place])   . Coronary artery disease     severe 3 vessel   . Hypertension   . History of tobacco abuse   . Urethral stricture due to infective diseases classified elsewhere    Past Surgical History  Procedure Laterality Date  . Flexible cystoscopy    . Coronary artery bypass graft      times 4  . Repair of post infarction posterior ventricular septal defect     Social History:  reports that he has quit smoking. He does not have any smokeless tobacco history on file. He reports that he does not drink alcohol or use illicit drugs. Allergies: No Known Allergies History reviewed. No pertinent family history.  Medications:  Prior to Admission:  (Not in a hospital admission) Scheduled: . amLODipine  10 mg Oral Daily  . aspirin EC  81 mg Oral Daily  . carvedilol  12.5 mg Oral BID WC  . heparin  5,000 Units Subcutaneous 3 times per day  . rosuvastatin  40 mg Oral Daily  . sodium chloride  3 mL Intravenous Q12H   ROS: no cp, sob, LE edema or myoclonus, no  N or V, good appetitie, no weight loss   Blood pressure 109/80, pulse 67, temperature 98.8 F (37.1 C), temperature source Oral, resp. rate 20, SpO2 100 %.  General appearance: alert and cooperative Head: Normocephalic, without obvious abnormality, atraumatic Eyes: negative Ears: normal TM's and external ear canals both ears Nose: Nares normal. Septum midline. Mucosa normal. No drainage or sinus tenderness. Throat: lips, mucosa, and tongue normal; teeth and gums normal Resp: clear to auscultation bilaterally Chest wall: no tenderness Cardio: regular rate and rhythm, S1, S2 normal, no murmur, click, rub or gallop GI: soft, non-tender; bowel sounds normal; no masses,  no organomegaly Extremities: extremities normal, atraumatic, no cyanosis or edema Skin: Skin color, texture, turgor normal. No rashes or lesions Neurologic: Grossly normalb No asterixis  Results for orders placed or performed during the hospital encounter of 01/24/15 (from the past 48 hour(s))  Urinalysis, Routine w reflex microscopic (not at [facility])     Status: Abnormal   Collection Time: 01/25/15  2:31 AM  Result Value Ref Range   Color, Urine YELLOW YELLOW   APPearance TURBID (A) CLEAR   Specific Gravity, Urine 1.007 1.005 - 1.030   pH 6.5 5.0 - 8.0   Glucose, UA NEGATIVE NEGATIVE mg/dL   Hgb urine dipstick SMALL (A) NEGATIVE   Bilirubin Urine NEGATIVE NEGATIVE  Ketones, ur NEGATIVE NEGATIVE mg/dL   Protein, ur 937 (A) NEGATIVE mg/dL   Nitrite NEGATIVE NEGATIVE   Leukocytes, UA LARGE (A) NEGATIVE  Urine microscopic-add on     Status: Abnormal   Collection Time: 01/25/15  2:31 AM  Result Value Ref Range   Squamous Epithelial / LPF 0-5 (A) NONE SEEN   WBC, UA TOO NUMEROUS TO COUNT 0 - 5 WBC/hpf   RBC / HPF NONE SEEN 0 - 5 RBC/hpf   Bacteria, UA MANY (A) NONE SEEN   Urine-Other TRICHOMONAS PRESENT   CBC with Differential     Status: Abnormal   Collection Time: 01/25/15  2:58 AM  Result Value Ref Range   WBC 9.0  4.0 - 10.5 K/uL   RBC 3.56 (L) 4.22 - 5.81 MIL/uL   Hemoglobin 9.5 (L) 13.0 - 17.0 g/dL   HCT 87.9 (L) 23.2 - 49.2 %   MCV 83.7 78.0 - 100.0 fL   MCH 26.7 26.0 - 34.0 pg   MCHC 31.9 30.0 - 36.0 g/dL   RDW 76.4 (H) 79.3 - 81.0 %   Platelets 482 (H) 150 - 400 K/uL   Neutrophils Relative % 59 %   Neutro Abs 5.4 1.7 - 7.7 K/uL   Lymphocytes Relative 33 %   Lymphs Abs 2.9 0.7 - 4.0 K/uL   Monocytes Relative 6 %   Monocytes Absolute 0.5 0.1 - 1.0 K/uL   Eosinophils Relative 2 %   Eosinophils Absolute 0.1 0.0 - 0.7 K/uL   Basophils Relative 0 %   Basophils Absolute 0.0 0.0 - 0.1 K/uL  Basic metabolic panel     Status: Abnormal   Collection Time: 01/25/15  2:58 AM  Result Value Ref Range   Sodium 140 135 - 145 mmol/L   Potassium 6.2 (HH) 3.5 - 5.1 mmol/L    Comment: CRITICAL RESULT CALLED TO, READ BACK BY AND VERIFIED WITH: A.DENNIS,RN AT 2693 ON 01/25/15 BY W.SHEA    Chloride 114 (H) 101 - 111 mmol/L   CO2 15 (L) 22 - 32 mmol/L   Glucose, Bld 103 (H) 65 - 99 mg/dL   BUN 61 (H) 6 - 20 mg/dL   Creatinine, Ser 7.09 (H) 0.61 - 1.24 mg/dL   Calcium 9.9 8.9 - 32.6 mg/dL   GFR calc non Af Amer 8 (L) >60 mL/min   GFR calc Af Amer 9 (L) >60 mL/min    Comment: (NOTE) The eGFR has been calculated using the CKD EPI equation. This calculation has not been validated in all clinical situations. eGFR's persistently <60 mL/min signify possible Chronic Kidney Disease.    Anion gap 11 5 - 15  I-stat troponin, ED     Status: None   Collection Time: 01/25/15  3:09 AM  Result Value Ref Range   Troponin i, poc 0.00 0.00 - 0.08 ng/mL   Comment 3            Comment: Due to the release kinetics of cTnI, a negative result within the first hours of the onset of symptoms does not rule out myocardial infarction with certainty. If myocardial infarction is still suspected, repeat the test at appropriate intervals.   I-stat chem 8, ed     Status: Abnormal   Collection Time: 01/25/15  3:11 AM  Result  Value Ref Range   Sodium 141 135 - 145 mmol/L   Potassium 6.1 (HH) 3.5 - 5.1 mmol/L   Chloride 117 (H) 101 - 111 mmol/L   BUN 57 (H) 6 -  20 mg/dL   Creatinine, Ser 6.10 (H) 0.61 - 1.24 mg/dL   Glucose, Bld 98 65 - 99 mg/dL   Calcium, Ion 1.30 1.13 - 1.30 mmol/L   TCO2 15 0 - 100 mmol/L   Hemoglobin 11.2 (L) 13.0 - 17.0 g/dL   HCT 33.0 (L) 39.0 - 52.0 %   Comment NOTIFIED PHYSICIAN   Renal function panel     Status: Abnormal   Collection Time: 01/25/15  1:12 PM  Result Value Ref Range   Sodium 143 135 - 145 mmol/L   Potassium 6.3 (HH) 3.5 - 5.1 mmol/L    Comment: NO VISIBLE HEMOLYSIS CRITICAL RESULT CALLED TO, READ BACK BY AND VERIFIED WITH: NEASE,A. RN AT 1415 01/25/15 MULLINS,T    Chloride 117 (H) 101 - 111 mmol/L   CO2 16 (L) 22 - 32 mmol/L   Glucose, Bld 107 (H) 65 - 99 mg/dL   BUN 64 (H) 6 - 20 mg/dL   Creatinine, Ser 5.91 (H) 0.61 - 1.24 mg/dL   Calcium 9.5 8.9 - 10.3 mg/dL   Phosphorus 5.7 (H) 2.5 - 4.6 mg/dL   Albumin 2.9 (L) 3.5 - 5.0 g/dL   GFR calc non Af Amer 9 (L) >60 mL/min   GFR calc Af Amer 10 (L) >60 mL/min    Comment: (NOTE) The eGFR has been calculated using the CKD EPI equation. This calculation has not been validated in all clinical situations. eGFR's persistently <60 mL/min signify possible Chronic Kidney Disease.    Anion gap 10 5 - 15   Dg Chest 2 View  01/25/2015  CLINICAL DATA:  Kidney infection. Weakness. Fall yesterday. Metabolic acid E media. EXAM: CHEST - 2 VIEW COMPARISON:  Two-view chest x-ray 12/05/2009 FINDINGS: The heart size is exaggerated by low lung volumes. Median sternotomy for CABG is noted. Mild bibasilar airspace disease is worse on the left. The upper lung fields are clear. There is no edema or effusion to suggest failure. The visualized soft tissues and bony thorax are unremarkable. IMPRESSION: 1. Low lung volumes and mild bibasilar airspace opacities likely reflect atelectasis. Early infection is considered less likely, but not  excluded. 2. Median sternotomy for CABG. Electronically Signed   By: San Morelle M.D.   On: 01/25/2015 12:06   US Renal  01/25/2015  CLINICAL DATA:  Acute renal failure. History of chronic hydronephrosis. Previous cystoscopy. EXAM: RENAL / URINARY TRACT ULTRASOUND COMPLETE COMPARISON:  Ultrasound 08/30/2010.  CT 08/29/2010. FINDINGS: Right Kidney: Length: 13.6 cm. There is severe chronic hydronephrosis with associated cortical thinning. There are small renal cysts, largest in the lower pole measuring 12 mm maximally. Left Kidney: Length: 12.0 cm. There is severe chronic hydronephrosis with associated cortical thinning. There are small renal cysts, largest measuring 12 mm in the upper pole. Bladder: Chronic bladder wall thickening and debris noted. Evaluation for ureteral jets not performed. IMPRESSION: Little change is seen from the prior studies of 2012. There is chronic bilateral hydronephrosis and hydroureter with bilateral renal cortical thinning. Persistent bladder wall thickening and debris, suggesting chronic bladder outlet obstruction. Electronically Signed   By: Richardean Sale M.D.   On: 01/25/2015 12:42   Dg Hip Unilat With Pelvis 2-3 Views Left  01/25/2015  CLINICAL DATA:  Left hip pain after injury. Tripped over grandchildren resulting in fall injuring left hip. EXAM: DG HIP (WITH OR WITHOUT PELVIS) 2-3V LEFT COMPARISON:  None. FINDINGS: The cortical margins of the bony pelvis and left hip are intact. No fracture. Pubic symphysis and sacroiliac joints are  congruent. Both femoral heads are well-seated in the respective acetabula. IMPRESSION: No fracture or dislocation of the pelvis or left hip. Electronically Signed   By: Jeb Levering M.D.   On: 01/25/2015 00:14    Assessment:  1 Probable advanced CKD stage 5, due to obstructive uropathy 2 Urethral stricture with chronic hydronephrosis 3 Hyperkalemic, hyperchloremic, metabolic acidosis 4 Pyuria 5 Anemia, prob due to  #1  Plan: 1 Urinary drainage per urology.  I have left message for [facility] to see.  Would keep pt at [facility] if possible for GU proceed 2 Education and education regarding CKD/ESRD and eventual need for renal replacement therapy 3 Will need AV access before discharge unless obvious improvement  Damiana Berrian C 01/25/2015, 2:22 PM

## 2015-01-25 NOTE — ED Notes (Signed)
UROLOGIST AT THE BEDSIDE 

## 2015-01-25 NOTE — Consult Note (Signed)
Consult: bilateral hydronephrosis, Acute on CRF, urinary retention Requested by: Dr. Lowell Guitar  History of Present Illness: 70 yo AAM with h/o Urethral stricture, Urinary retention and bilateral hydronephrosis who underwent urethral stricture dilation and catheter placement in 2012.  At that time his creatinine gone up to 2 and decreased to 1.2 after catheter placement.  He presented to the emergency department earlier today with some left flank pain and a history of falling.  He was found to have a creatinine of 6 and ultrasound showed bilateral hydronephrosis and a distended bladder.  He reports He reports he typically voids with a good flow but has to go frequently. He denies dysuria or gross hematuria.   He was given dose of Rocephin earlier today.   Past Medical History  Diagnosis Date  . Acute MI, inferior wall ([facility])   . Coronary artery disease     severe 3 vessel   . Hypertension   . History of tobacco abuse   . Urethral stricture due to infective diseases classified elsewhere    Past Surgical History  Procedure Laterality Date  . Flexible cystoscopy    . Coronary artery bypass graft      times 4  . Repair of post infarction posterior ventricular septal defect      Home Medications:   (Not in a hospital admission) Allergies: No Known Allergies  History reviewed. No pertinent family history. Social History:  reports that he has quit smoking. He does not have any smokeless tobacco history on file. He reports that he does not drink alcohol or use illicit drugs.  ROS: A complete review of systems was performed.  All systems are negative except for pertinent findings as noted. Review of Systems  All other systems reviewed and are negative.    Physical Exam:  Vital signs in last 24 hours: Temp:  [98.3 F (36.8 C)-98.8 F (37.1 C)] 98.6 F (37 C) (01/12 1501) Pulse Rate:  [67-104] 71 (01/12 1501) Resp:  [13-38] 16 (01/12 1501) BP: (104-143)/(72-94) 122/85 mmHg (01/12  1501) SpO2:  [98 %-100 %] 100 % (01/12 1501) General:  Alert and oriented, No acute distress HEENT: Normocephalic, atraumatic Abdomen: Soft, nontender, bladder palpably distended Back: No CVA tenderness Extremities: No edema Neurologic: Grossly intact GU: uncircumcised penis, no lesions of foreskin.  On DRE, prostate mildly enlarged but smooth without hard area or nodules. Not the best exam as patient was laying on side on ED stretcher.   Procedure: After verbal consent was obtained, pt was prepped and draped. I tried to place an 18Fr coude but it was too tight even distally. I switched to a 14 Fr coude which went well down to the bulb where it met resistance. There was than a "pop" and it went into the bladder and drained clear urine. Balloon seated at the [facility]. 1400 ml of clear urine with some white sediment near the end drained.   Laboratory Data:  Results for orders placed or performed during the hospital encounter of 01/24/15 (from the past 24 hour(s))  GC/Chlamydia probe amp ([place])not at [facility]     Status: None   Collection Time: 01/25/15 12:00 AM  Result Value Ref Range   Chlamydia Negative    Neisseria gonorrhea Negative   Urinalysis, Routine w reflex microscopic (not at [facility])     Status: Abnormal   Collection Time: 01/25/15  2:31 AM  Result Value Ref Range   Color, Urine YELLOW YELLOW   APPearance TURBID (A) CLEAR   Specific Gravity,  Urine 1.007 1.005 - 1.030   pH 6.5 5.0 - 8.0   Glucose, UA NEGATIVE NEGATIVE mg/dL   Hgb urine dipstick SMALL (A) NEGATIVE   Bilirubin Urine NEGATIVE NEGATIVE   Ketones, ur NEGATIVE NEGATIVE mg/dL   Protein, ur 100 (A) NEGATIVE mg/dL   Nitrite NEGATIVE NEGATIVE   Leukocytes, UA LARGE (A) NEGATIVE  Urine microscopic-add on     Status: Abnormal   Collection Time: 01/25/15  2:31 AM  Result Value Ref Range   Squamous Epithelial / LPF 0-5 (A) NONE SEEN   WBC, UA TOO NUMEROUS TO COUNT 0 - 5 WBC/hpf   RBC / HPF NONE SEEN 0 - 5 RBC/hpf    Bacteria, UA MANY (A) NONE SEEN   Urine-Other TRICHOMONAS PRESENT   CBC with Differential     Status: Abnormal   Collection Time: 01/25/15  2:58 AM  Result Value Ref Range   WBC 9.0 4.0 - 10.5 K/uL   RBC 3.56 (L) 4.22 - 5.81 MIL/uL   Hemoglobin 9.5 (L) 13.0 - 17.0 g/dL   HCT 29.8 (L) 39.0 - 52.0 %   MCV 83.7 78.0 - 100.0 fL   MCH 26.7 26.0 - 34.0 pg   MCHC 31.9 30.0 - 36.0 g/dL   RDW 16.1 (H) 11.5 - 15.5 %   Platelets 482 (H) 150 - 400 K/uL   Neutrophils Relative % 59 %   Neutro Abs 5.4 1.7 - 7.7 K/uL   Lymphocytes Relative 33 %   Lymphs Abs 2.9 0.7 - 4.0 K/uL   Monocytes Relative 6 %   Monocytes Absolute 0.5 0.1 - 1.0 K/uL   Eosinophils Relative 2 %   Eosinophils Absolute 0.1 0.0 - 0.7 K/uL   Basophils Relative 0 %   Basophils Absolute 0.0 0.0 - 0.1 K/uL  Basic metabolic panel     Status: Abnormal   Collection Time: 01/25/15  2:58 AM  Result Value Ref Range   Sodium 140 135 - 145 mmol/L   Potassium 6.2 (HH) 3.5 - 5.1 mmol/L   Chloride 114 (H) 101 - 111 mmol/L   CO2 15 (L) 22 - 32 mmol/L   Glucose, Bld 103 (H) 65 - 99 mg/dL   BUN 61 (H) 6 - 20 mg/dL   Creatinine, Ser 6.16 (H) 0.61 - 1.24 mg/dL   Calcium 9.9 8.9 - 10.3 mg/dL   GFR calc non Af Amer 8 (L) >60 mL/min   GFR calc Af Amer 9 (L) >60 mL/min   Anion gap 11 5 - 15  I-stat troponin, ED     Status: None   Collection Time: 01/25/15  3:09 AM  Result Value Ref Range   Troponin i, poc 0.00 0.00 - 0.08 ng/mL   Comment 3          I-stat chem 8, ed     Status: Abnormal   Collection Time: 01/25/15  3:11 AM  Result Value Ref Range   Sodium 141 135 - 145 mmol/L   Potassium 6.1 (HH) 3.5 - 5.1 mmol/L   Chloride 117 (H) 101 - 111 mmol/L   BUN 57 (H) 6 - 20 mg/dL   Creatinine, Ser 6.10 (H) 0.61 - 1.24 mg/dL   Glucose, Bld 98 65 - 99 mg/dL   Calcium, Ion 1.30 1.13 - 1.30 mmol/L   TCO2 15 0 - 100 mmol/L   Hemoglobin 11.2 (L) 13.0 - 17.0 g/dL   HCT 33.0 (L) 39.0 - 52.0 %   Comment NOTIFIED PHYSICIAN   Renal function panel  Status: Abnormal   Collection Time: 01/25/15  1:12 PM  Result Value Ref Range   Sodium 143 135 - 145 mmol/L   Potassium 6.3 (HH) 3.5 - 5.1 mmol/L   Chloride 117 (H) 101 - 111 mmol/L   CO2 16 (L) 22 - 32 mmol/L   Glucose, Bld 107 (H) 65 - 99 mg/dL   BUN 64 (H) 6 - 20 mg/dL   Creatinine, Ser 5.91 (H) 0.61 - 1.24 mg/dL   Calcium 9.5 8.9 - 10.3 mg/dL   Phosphorus 5.7 (H) 2.5 - 4.6 mg/dL   Albumin 2.9 (L) 3.5 - 5.0 g/dL   GFR calc non Af Amer 9 (L) >60 mL/min   GFR calc Af Amer 10 (L) >60 mL/min   Anion gap 10 5 - 15   No results found for this or any previous visit (from the past 240 hour(s)). Creatinine:  Recent Labs  01/25/15 0258 01/25/15 0311 01/25/15 1312  CREATININE 6.16* 6.10* 5.91*    Impression/Assessment/plan: Acute on chronic renal failure, bilateral hydronephrosis and distended bladder on u/s and exam -- likely from urethral stricture causing bladder outlet obstruction. Discussed with Dr. Florene Glen. Watch for a post-obstructive diuresis. Discussed with Dr. Doyle Askew. Will follow.    Kaylee Trivett 01/25/2015, 4:48 PM

## 2015-01-25 NOTE — ED Notes (Signed)
Pt reports noticing red-colored fluid in his catheter bag

## 2015-01-25 NOTE — ED Provider Notes (Signed)
CSN: [id number]     Arrival date & time 01/24/15  2023 History   First MD Initiated Contact with Patient 01/25/15 0206     Chief Complaint  Patient presents with  . Fall     (Consider location/radiation/quality/duration/timing/severity/associated sxs/prior Treatment) Patient is a 70 y.o. male presenting with fall. The history is provided by the patient and medical records.  Fall Associated symptoms include arthralgias.    70 y.o. M with hx of MI, HTN, CAD, urethral strictures, presenting to the ED multiple complaints.  Mainly, patient states he came because he fell this morning.  He states he tripped and fell on a carpeted floor after tripping over the bed covers on the floor.  He states he fell onto his left hip.  Denies head injury or LOC.  Patient states pain is throbbing in nature, worse with weightbearing and ambulation. He states he is able to walk unassisted. Patient also complains of some urinary frequency for the past month. He states "whatever I drink, it runs right through me". He denies any dysuria or hematuria. No fever or chills. No abdominal pain or flank pain. Patient has no history of diabetes.  Patient lastly complaints of indigestion.  He states that with his prior MI he did have some acid indigestion as well.  He denies any SOB, diaphoresis, nausea, vomiting, numbness, or weakness. No cough or recent illness.  Patient states his cardiologist is Dr. Stanford Breed.  He does not currently have a PCP.  Past Medical History  Diagnosis Date  . Acute MI, inferior wall ([place])   . Coronary artery disease     severe 3 vessel   . Hypertension   . History of tobacco abuse   . Urethral stricture due to infective diseases classified elsewhere    Past Surgical History  Procedure Laterality Date  . Flexible cystoscopy    . Coronary artery bypass graft      times 4  . Repair of post infarction posterior ventricular septal defect     History reviewed. No pertinent family history. Social  History  Substance Use Topics  . Smoking status: Former Research scientist (life sciences)  . Smokeless tobacco: None  . Alcohol Use: No    Review of Systems  Gastrointestinal:       GERD  Genitourinary: Positive for frequency.  Musculoskeletal: Positive for arthralgias.  All other systems reviewed and are negative.     Allergies  Review of patient's allergies indicates no known allergies.  Home Medications   Prior to Admission medications   Medication Sig Start Date End Date Taking? Authorizing Provider  amLODipine (NORVASC) 10 MG tablet take 1 tablet by mouth once daily 11/30/14  Yes Lelon Perla, MD  aspirin EC 81 MG tablet Take 1 tablet (81 mg total) by mouth daily. 11/06/14  Yes Lelon Perla, MD  carvedilol (COREG) 12.5 MG tablet take 1 tablet by mouth twice a day with meals 11/30/14  Yes Lelon Perla, MD  lisinopril (PRINIVIL,ZESTRIL) 10 MG tablet take 1 tablet by mouth once daily 11/30/14  Yes Lelon Perla, MD  rosuvastatin (CRESTOR) 40 MG tablet take 1 tablet by mouth once daily 11/30/14  Yes Lelon Perla, MD   BP 123/91 mmHg  Pulse 74  Temp(Src) 98.3 F (36.8 C) (Oral)  Resp 16  SpO2 100%   Physical Exam  Constitutional: He is oriented to person, place, and time. He appears well-developed and well-nourished. No distress.  HENT:  Head: Normocephalic and atraumatic.  Mouth/Throat: Uvula is  midline, oropharynx is clear and moist and mucous membranes are normal.  Poor dentition  Eyes: Conjunctivae and EOM are normal. Pupils are equal, round, and reactive to light.  Neck: Normal range of motion. Neck supple.  Cardiovascular: Normal rate, regular rhythm and normal heart sounds.   Pulmonary/Chest: Effort normal and breath sounds normal. No respiratory distress. He has no wheezes. He has no rhonchi.  Well healed midline sternotomy scar; chest wall non-tender; lungs clear  Abdominal: Soft. Bowel sounds are normal. There is no tenderness. There is no guarding.   Musculoskeletal: Normal range of motion. He exhibits no edema.  Left hip with mild tenderness along lateral aspect; no bruising or bony deformities; no leg shortening or malrotation; full ROM of hip, minimal pain noted with flexion; leg is NVI; ambulatory with normal gait  Neurological: He is alert and oriented to person, place, and time.  Skin: Skin is warm and dry. He is not diaphoretic.  Psychiatric: He has a normal mood and affect.  Nursing note and vitals reviewed.   ED Course  Procedures (including critical care time) Labs Review Labs Reviewed  URINALYSIS, ROUTINE W REFLEX MICROSCOPIC (NOT AT [facility]) - Abnormal; Notable for the following:    APPearance TURBID (*)    Hgb urine dipstick SMALL (*)    Protein, ur 100 (*)    Leukocytes, UA LARGE (*)    All other components within normal limits  URINE MICROSCOPIC-ADD ON - Abnormal; Notable for the following:    Squamous Epithelial / LPF 0-5 (*)    Bacteria, UA MANY (*)    All other components within normal limits  CBC WITH DIFFERENTIAL/PLATELET - Abnormal; Notable for the following:    RBC 3.56 (*)    Hemoglobin 9.5 (*)    HCT 29.8 (*)    RDW 16.1 (*)    Platelets 482 (*)    All other components within normal limits  BASIC METABOLIC PANEL - Abnormal; Notable for the following:    Potassium 6.2 (*)    Chloride 114 (*)    CO2 15 (*)    Glucose, Bld 103 (*)    BUN 61 (*)    Creatinine, Ser 6.16 (*)    GFR calc non Af Amer 8 (*)    GFR calc Af Amer 9 (*)    All other components within normal limits  I-STAT CHEM 8, ED - Abnormal; Notable for the following:    Potassium 6.1 (*)    Chloride 117 (*)    BUN 57 (*)    Creatinine, Ser 6.10 (*)    Hemoglobin 11.2 (*)    HCT 33.0 (*)    All other components within normal limits  URINE CULTURE  I-STAT TROPOININ, ED  GC/CHLAMYDIA PROBE AMP ([place]) NOT AT [facility]    Imaging Review Dg Hip Unilat With Pelvis 2-3 Views Left  01/25/2015  CLINICAL DATA:  Left hip pain after  injury. Tripped over grandchildren resulting in fall injuring left hip. EXAM: DG HIP (WITH OR WITHOUT PELVIS) 2-3V LEFT COMPARISON:  None. FINDINGS: The cortical margins of the bony pelvis and left hip are intact. No fracture. Pubic symphysis and sacroiliac joints are congruent. Both femoral heads are well-seated in the respective acetabula. IMPRESSION: No fracture or dislocation of the pelvis or left hip. Electronically Signed   By: Jeb Levering M.D.   On: 01/25/2015 00:14   I have personally reviewed and evaluated these images and lab results as part of my medical decision-making.   EKG  Interpretation   Date/Time:  Thursday January 25 2015 02:28:25 EST Ventricular Rate:  72 PR Interval:  193 QRS Duration: 92 QT Interval:  410 QTC Calculation: 449 R Axis:   28 Text Interpretation:  Sinus rhythm Confirmed by [facility]  MD, APRIL  ([id number]) on 01/25/2015 2:34:59 AM      MDM   Final diagnoses:  Acute renal failure, unspecified acute renal failure type (HCC)  Hyperkalemia  UTI (lower urinary tract infection)  Trichomonas infection   70 year old male here after a mechanical fall onto carpeted floor earlier today. He complains of left hip pain. X-ray was obtained which is negative for acute findings. Patient remains ambulatory. Leg is neurovascularly intact. Patient additionally complains of acid reflux type symptoms, notably with belching. Patient does have cardiac history. His EKG is reassuring.  Given patient's age, labs were obtained which reveal ARF with SrCr of 6.16 and hyperkalemia at 6.2.  T-waves remain normal on EKG.  Patient's u/a also appears infectious, trichomonas noted.  Patient states he has no hx of CKD, no hx of STD.  Urine culture and gc/chl swab pending.  Patient given IVF, rocephin, azithromycin, and flagyl.  Patient will need admission for further management of his ARF.  Case discussed with Dr. Blaine Hamper-- recommends transfer to [facility] under care of Dr. Hal Hope.  Will  consult nephrology.  Patient has no current EKG changes.  Will treat hyperkalemia with kayexalate, insulin, glucagon, and albuterol.  5:10 AM Case discussed with Dr. Moshe Cipro-- will see patient in the a.m. once [facility].  Of note, patient is currently on an ACEI (lisinopril).  No labs have been done since 2015 so hard to discern how long patient has had worsening renal function.  Larene Pickett, PA-C 01/25/15 0540  Veatrice Kells, MD 01/25/15 [phone]

## 2015-01-25 NOTE — ED Notes (Signed)
Ultrasound at bedside

## 2015-01-25 NOTE — Progress Notes (Signed)
CSW staffed with nurse and was informed patient is waiting for a bed at [facility].   Genice Rouge [id number] ED CSW 01/25/2015 12:39 PM

## 2015-01-25 NOTE — ED Notes (Signed)
Delay in medication administration due to Coreg and Heparin not being delivered by pharmacy

## 2015-01-25 NOTE — Progress Notes (Signed)
Pending at admission:  70 year old male with history of hypertension, hyperlipidemia, CAD, tobacco abuse, hx of urethra stricture secondary to infection disease, systolic congestive heart failure, CKD-3, who presents with increased urinary frequency and pain over left flank and thigh area after fall. X-ray of the left hip/pelvis is negative for fracture. Patient was found to have positive urinalysis and worsening renal function with potassium 6.2, creatinine 6.16, BUN 61. EKG has no T-wave change. Accepted to tele bed to [facility] hospital (currently no tele bed in [facility]). Renal will be consulted by EDP. IV Rocephin started by EDP. Will give Kayexalate 30 g 1.  Ivor Costa, MD  [facility] Pager [phone]  If 7PM-7AM, please contact night-coverage [email or web address] Password [facility] 01/25/2015, 4:49 AM

## 2015-01-25 NOTE — Progress Notes (Signed)
CSW attempted a visit to see patient. Patient was out of the room at the time.   [name] [id number] ED CSW 01/25/2015 9:52 AM

## 2015-01-25 NOTE — ED Notes (Signed)
UROLOGIST DR.POWELL MADE AWARE OF PINK-TINGED URINE OUTPUT.

## 2015-01-25 NOTE — H&P (Signed)
[facility] History and Physical  [name] [id number] DOB: [birth year] DOA: 01/24/2015  Referring physician: ED physician, Dr. Nicholes Stairs  PCP: Nadeen Landau., MD   Cardiologist: Dr. Stanford Breed  Chief Complaint: Left flank and left thigh pain  HPI:  Patient is 70 year old male with known history of hypertension, hyperlipidemia, acute inferior MI in September 2011 due to occlusion of PL branch, With severe 3 vessel CAD with TIMI 3, sustained VSD post-MI and taken for emergency CABG 4, status post repair of ventricular septal defect in September 2011, EF 45% per echocardiogram in 2014, CKD stage III, presents to [facility] ED with main concern of 1-2 days duration of progressively worsening left flank pain, initially intermittent and sharp in nature, currently constant, 10/10 in severity, occasionally but not consistently radiating to left thigh area. Patient reports he fell several days prior to this admission and landed on the left side and thinks that his symptoms may be due to the fall. She denies fevers and chills, no specific abdominal concerns, reports urinary urgency and frequency, denies hematuria. He reports compliance with medications.  In emergency department, patient noted to be hemodynamically stable, vital signs stable, blood work notable for Hg 9.5, repeat 11.2, K 6.2, confirmed with repeat blood work, bicarbonate 15, Cr 6.16 also confirmed by repeat BMP. Nephrology is consulted by emergency room doctor and recommendation was to transfer patient to [facility] telemetry unit.  Assessment and Plan:  Principal Problem:   Acute renal failure superimposed on stage 3 chronic kidney disease (HCC) - with metabolic acidosis and hyperkalemia - unclear etiology at this time, ? Lisinopril vs progression of chronic renal disease  - last Cr in August 2015 was 1.7  - requested renal US, U Na and U Cr - nephrology team consulted, transferring pt to [facility]  - stop Lisinopril, as far as I  can tell, pt has not been taking any other nephrotoxic meds  - last kayexalate given at 6 am 30 gm - rest of the management pre renal team, assistance is appreciated   Active Problems:   Acute left flank pain - unclear if related to pyelo, UA with large leukocytes and pt started on rocephin in ED - I think that is reasonable as pt did report urinary frequency and urgency  - will continue for now, follow up on renal US and follow up on urine cultures    CAD, AUTOLOGOUS BYPASS GRAFT   SYSTOLIC HEART FAILURE, CHRONIC - weight at pt's baseline, 212 lbs - monitor daily weights, strict I/O    Essential HTN - continue home medical regimen except Lisinopril     Thrombocytosis (HCC) - possibly reactive - CBC in AM    Ventricular septal defect - history of VSD, repaired   Heparin SQ for DVT prophylaxis   Radiological Exams on Admission: Dg Hip Unilat With Pelvis 2-3 Views Left 01/25/2015  No fracture or dislocation of the pelvis or left hip.   Code Status: Full Family Communication: Pt at bedside Disposition Plan: Admit for further evaluation, transfer to [place] [id number]   Review of Systems:  Constitutional: Negative for diaphoresis.  HENT: Negative for hearing loss, ear pain, nosebleeds, congestion, sore throat, neck pain, tinnitus and ear discharge.   Eyes: Negative for blurred vision, double vision, photophobia, pain, discharge and redness.  Respiratory: Negative for shortness of breath, wheezing and stridor.   Cardiovascular: Negative for chest pain, palpitations, orthopnea, claudication and leg swelling.  Gastrointestinal: Negative for heartburn, constipation, blood in stool  and melena.  Genitourinary: per HPI Musculoskeletal: Negative for myalgias, back pain, joint pain and falls.  Skin: Negative for itching and rash.  Neurological: Negative for dizziness and weakness.  Endo/Heme/Allergies: Negative for environmental allergies and polydipsia. Does not  bruise/bleed easily.  Psychiatric/Behavioral: Negative for suicidal ideas. The patient is not nervous/anxious.      Past Medical History  Diagnosis Date  . Acute MI, inferior wall ([place])   . Coronary artery disease     severe 3 vessel   . Hypertension   . History of tobacco abuse   . Urethral stricture due to infective diseases classified elsewhere     Past Surgical History  Procedure Laterality Date  . Flexible cystoscopy    . Coronary artery bypass graft      times 4  . Repair of post infarction posterior ventricular septal defect      Social History:  reports that he has quit smoking. He does not have any smokeless tobacco history on file. He reports that he does not drink alcohol or use illicit drugs.  No Known Allergies  No family history of renal disease, DM  Prior to Admission medications   Medication Sig Start Date End Date Taking? Authorizing Provider  amLODipine (NORVASC) 10 MG tablet take 1 tablet by mouth once daily 11/30/14  Yes Lelon Perla, MD  aspirin EC 81 MG tablet Take 1 tablet (81 mg total) by mouth daily. 11/06/14  Yes Lelon Perla, MD  carvedilol (COREG) 12.5 MG tablet take 1 tablet by mouth twice a day with meals 11/30/14  Yes Lelon Perla, MD  lisinopril (PRINIVIL,ZESTRIL) 10 MG tablet take 1 tablet by mouth once daily 11/30/14  Yes Lelon Perla, MD  rosuvastatin (CRESTOR) 40 MG tablet take 1 tablet by mouth once daily 11/30/14  Yes Lelon Perla, MD    Physical Exam: Filed Vitals:   01/25/15 0530 01/25/15 0600 01/25/15 0630 01/25/15 0800  BP: 126/72 128/81 105/73 128/72  Pulse: 73 72 74 72  Temp:      TempSrc:      Resp: 17 16 13 13   SpO2: 100% 100% 99% 98%    Physical Exam  Constitutional: Appears well-developed and well-nourished. No distress.  HENT: Normocephalic. External right and left ear normal.  Eyes: Conjunctivae and EOM are normal. PERRLA, no scleral icterus.  Neck: Normal ROM. Neck supple. No JVD. No tracheal  deviation. No thyromegaly.  CVS: RRR, no gallops, no carotid bruit.  Pulmonary: Effort and breath sounds normal, no stridor, rhonchi, wheezes, rales.  Abdominal: Soft. BS +,  no distension, tenderness, rebound or guarding.  Musculoskeletal: Normal range of motion.  Lymphadenopathy: No lymphadenopathy noted, cervical, inguinal. Neuro: Alert. Normal reflexes, muscle tone coordination. No cranial nerve deficit. Skin: Skin is warm and dry. No rash noted. Not diaphoretic. No erythema. No pallor.  Psychiatric: Normal mood and affect.   Labs on Admission:  Basic Metabolic Panel:  Recent Labs Lab 01/25/15 0258 01/25/15 0311  NA 140 141  K 6.2* 6.1*  CL 114* 117*  CO2 15*  --   GLUCOSE 103* 98  BUN 61* 57*  CREATININE 6.16* 6.10*  CALCIUM 9.9  --    CBC:  Recent Labs Lab 01/25/15 0258 01/25/15 0311  WBC 9.0  --   NEUTROABS 5.4  --   HGB 9.5* 11.2*  HCT 29.8* 33.0*  MCV 83.7  --   PLT 482*  --     EKG: Normal sinus rhythm, no ST/T wave changes  If 7PM-7AM, please contact night-coverage [email or web address] Password [facility] 01/25/2015, 8:56 AM

## 2015-01-26 ENCOUNTER — Inpatient Hospital Stay (HOSPITAL_COMMUNITY): Payer: Medicare Other

## 2015-01-26 DIAGNOSIS — N320 Bladder-neck obstruction: Secondary | ICD-10-CM

## 2015-01-26 DIAGNOSIS — E872 Acidosis: Secondary | ICD-10-CM

## 2015-01-26 DIAGNOSIS — N179 Acute kidney failure, unspecified: Principal | ICD-10-CM

## 2015-01-26 DIAGNOSIS — N183 Chronic kidney disease, stage 3 (moderate): Secondary | ICD-10-CM

## 2015-01-26 LAB — RENAL FUNCTION PANEL
Albumin: 2.9 g/dL — ABNORMAL LOW (ref 3.5–5.0)
Anion gap: 9 (ref 5–15)
BUN: 55 mg/dL — AB (ref 6–20)
CALCIUM: 9.4 mg/dL (ref 8.9–10.3)
CO2: 18 mmol/L — AB (ref 22–32)
Chloride: 114 mmol/L — ABNORMAL HIGH (ref 101–111)
Creatinine, Ser: 5.47 mg/dL — ABNORMAL HIGH (ref 0.61–1.24)
GFR calc non Af Amer: 9 mL/min — ABNORMAL LOW (ref >60)
GFR, EST AFRICAN AMERICAN: 11 mL/min — AB (ref >60)
GLUCOSE: 158 mg/dL — AB (ref 65–99)
Phosphorus: 4.9 mg/dL — ABNORMAL HIGH (ref 2.5–4.6)
Potassium: 4.3 mmol/L (ref 3.5–5.1)
SODIUM: 141 mmol/L (ref 135–145)

## 2015-01-26 LAB — URINE CULTURE: Culture: 3000

## 2015-01-26 LAB — CBC
HCT: 30.9 % — ABNORMAL LOW (ref 39.0–52.0)
Hemoglobin: 9.9 g/dL — ABNORMAL LOW (ref 13.0–17.0)
MCH: 26.5 pg (ref 26.0–34.0)
MCHC: 32 g/dL (ref 30.0–36.0)
MCV: 82.8 fL (ref 78.0–100.0)
PLATELETS: 471 10*3/uL — AB (ref 150–400)
RBC: 3.73 MIL/uL — AB (ref 4.22–5.81)
RDW: 16.1 % — ABNORMAL HIGH (ref 11.5–15.5)
WBC: 10.8 10*3/uL — AB (ref 4.0–10.5)

## 2015-01-26 LAB — HIV ANTIBODY (ROUTINE TESTING W REFLEX): HIV SCREEN 4TH GENERATION: NONREACTIVE

## 2015-01-26 NOTE — Progress Notes (Signed)
[facility] PROGRESS NOTE  Bettie Quebodeaux [id number] DOB: Dec 29, 1945 DOA: 01/24/2015 PCP: Nadeen Landau., MD  Brief narrative 70 year old male with history of hypertension, CAD with history of MI in september 2011 with severe three-vessel disease, sustained VSD post MI and underwent CABG 4 with repair of VSD, cardiomyopathy with EF of 45% (as per echo in 2014), CKD stage III , history of urethral stricture (status post urethral stricture dilatation and catheter placement in 2012) presented [name] ED with 3 days of worsening left flank pain. In the ED vitals were stable. Blood work showed hemoglobin of 9.5, potassium 6.2 with bicarbonate of 15 and elevated creatinine of 6.16. Renal ultrasound done showed bilateral hydronephrosis with distended bladder. Renal and urology consulted. Acute on chronic bladder outlet obstruction suspected due to urethral stricture. Patient urology placed a 43 French coud catheter on admission and patient admitted to hospitalist service.   Assessment/Plan: Acute on chronic kidney disease stage III secondary to bladder outlet obstruction Catheter placed on admission by urology and patient diuresing well. Renal function slowly starting to improve.. Patient denies further flank pain. Will discontinue IV fluids (receiving IV sodium bicarbonate).  Monitor for post obstructive diuresis.  Appreciate renal and urology follow-up.  Coronary artery disease with history of MI and cardiomyopathy Stable. Continue aspirin. Hold lisinopril. Continue Coreg and statin.  Metabolic acidosis with hyperkalemia Secondary to bladder outlet obstruction. Improving.  DVT prophylaxis: Subcutaneous heparin Diet: Heart healthy  Code Status: Full code Family Communication: None at bedside Disposition Plan: Home once renal function improved appropriately   Consultants:  Renal (Dr. Florene Glen)  Urology (Dr. Junious Silk)  Procedures:  Renal  ultrasound  Antibiotics:  None  HPI/Subjective: Seen and examined. Reports better.  Objective: Filed Vitals:   01/26/15 0604 01/26/15 1353  BP: 107/49 102/58  Pulse: 94 86  Temp: 98 F (36.7 C) 98.8 F (37.1 C)  Resp: 18 20    Intake/Output Summary (Last 24 hours) at 01/26/15 1519 Last data filed at 01/26/15 1300  Gross per 24 hour  Intake 3094.59 ml  Output   3550 ml  Net -455.41 ml   Filed Weights   01/25/15 2345 01/26/15 0604  Weight: 86.955 kg (191 lb 11.2 oz) 86.8 kg (191 lb 5.8 oz)    Exam:   General:  Elderly male not in distress  HEENT: Mucosa  Chest: Clear bilaterally  CVS: Normal S1 and S2, no murmurs rub or gallop  GI Soft, nondistended, nontender, foley  catheter in place draining clear urine  Musculoskeletal: Warm, no edema  Data Reviewed: Basic Metabolic Panel:  Recent Labs Lab 01/25/15 0258 01/25/15 0311 01/25/15 1312 01/26/15 0515  NA 140 141 143 141  K 6.2* 6.1* 6.3* 4.3  CL 114* 117* 117* 114*  CO2 15*  --  16* 18*  GLUCOSE 103* 98 107* 158*  BUN 61* 57* 64* 55*  CREATININE 6.16* 6.10* 5.91* 5.47*  CALCIUM 9.9  --  9.5 9.4  PHOS  --   --  5.7* 4.9*   Liver Function Tests:  Recent Labs Lab 01/25/15 1312 01/26/15 0515  ALBUMIN 2.9* 2.9*   No results for input(s): LIPASE, AMYLASE in the last 168 hours. No results for input(s): AMMONIA in the last 168 hours. CBC:  Recent Labs Lab 01/25/15 0258 01/25/15 0311 01/26/15 0515  WBC 9.0  --  10.8*  NEUTROABS 5.4  --   --   HGB 9.5* 11.2* 9.9*  HCT 29.8* 33.0* 30.9*  MCV 83.7  --  82.8  PLT 482*  --  471*   Cardiac Enzymes: No results for input(s): CKTOTAL, CKMB, CKMBINDEX, TROPONINI in the last 168 hours. BNP (last 3 results) No results for input(s): BNP in the last 8760 hours.  ProBNP (last 3 results) No results for input(s): PROBNP in the last 8760 hours.  CBG: No results for input(s): GLUCAP in the last 168 hours.  Recent Results (from the past 240 hour(s))   Urine culture     Status: None   Collection Time: 01/25/15  2:31 AM  Result Value Ref Range Status   Specimen Description URINE, CLEAN CATCH  Final   Special Requests NONE  Final   Culture   Final    3,000 COLONIES/mL INSIGNIFICANT GROWTH Performed at [facility]    Report Status 01/26/2015 FINAL  Final     Studies: Dg Chest 2 View  01/25/2015  CLINICAL DATA:  Kidney infection. Weakness. Fall yesterday. Metabolic acid E media. EXAM: CHEST - 2 VIEW COMPARISON:  Two-view chest x-ray 12/05/2009 FINDINGS: The heart size is exaggerated by low lung volumes. Median sternotomy for CABG is noted. Mild bibasilar airspace disease is worse on the left. The upper lung fields are clear. There is no edema or effusion to suggest failure. The visualized soft tissues and bony thorax are unremarkable. IMPRESSION: 1. Low lung volumes and mild bibasilar airspace opacities likely reflect atelectasis. Early infection is considered less likely, but not excluded. 2. Median sternotomy for CABG. Electronically Signed   By: San Morelle M.D.   On: 01/25/2015 12:06   US Renal  01/25/2015  CLINICAL DATA:  Acute renal failure. History of chronic hydronephrosis. Previous cystoscopy. EXAM: RENAL / URINARY TRACT ULTRASOUND COMPLETE COMPARISON:  Ultrasound 08/30/2010.  CT 08/29/2010. FINDINGS: Right Kidney: Length: 13.6 cm. There is severe chronic hydronephrosis with associated cortical thinning. There are small renal cysts, largest in the lower pole measuring 12 mm maximally. Left Kidney: Length: 12.0 cm. There is severe chronic hydronephrosis with associated cortical thinning. There are small renal cysts, largest measuring 12 mm in the upper pole. Bladder: Chronic bladder wall thickening and debris noted. Evaluation for ureteral jets not performed. IMPRESSION: Little change is seen from the prior studies of 2012. There is chronic bilateral hydronephrosis and hydroureter with bilateral renal cortical thinning.  Persistent bladder wall thickening and debris, suggesting chronic bladder outlet obstruction. Electronically Signed   By: Richardean Sale M.D.   On: 01/25/2015 12:42   Dg Hip Unilat With Pelvis 2-3 Views Left  01/25/2015  CLINICAL DATA:  Left hip pain after injury. Tripped over grandchildren resulting in fall injuring left hip. EXAM: DG HIP (WITH OR WITHOUT PELVIS) 2-3V LEFT COMPARISON:  None. FINDINGS: The cortical margins of the bony pelvis and left hip are intact. No fracture. Pubic symphysis and sacroiliac joints are congruent. Both femoral heads are well-seated in the respective acetabula. IMPRESSION: No fracture or dislocation of the pelvis or left hip. Electronically Signed   By: Jeb Levering M.D.   On: 01/25/2015 00:14    Scheduled Meds: . amLODipine  10 mg Oral Daily  . aspirin EC  81 mg Oral Daily  . carvedilol  12.5 mg Oral BID WC  . cefTRIAXone (ROCEPHIN)  IV  1 g Intravenous Q24H  . heparin  5,000 Units Subcutaneous 3 times per day  . rosuvastatin  40 mg Oral Daily  . sodium chloride  3 mL Intravenous Q12H   Continuous Infusions: .  sodium bicarbonate 150 mEq in sterile water 1000 mL infusion 125 mL/hr at 01/26/15 [phone]  Time spent: 25 minutes    Louellen Molder  [facility] Pager [phone]. If 7PM-7AM, please contact night-coverage at [email or web address], password [facility] 01/26/2015, 3:19 PM  LOS: 1 day

## 2015-01-26 NOTE — Discharge Instructions (Signed)
Foley Catheter Care, Adult    A Foley catheter is a soft, flexible tube. This tube is placed into your bladder to drain pee (urine). If you go home with this catheter in place, follow the instructions below.  TAKING CARE OF THE CATHETER  1. Wash your hands with soap and water.  2. Put soap and water on a clean washcloth.  ¨ Clean the skin where the tube goes into your body.  § Clean away from the tube site.  § Never wipe toward the tube.  § Clean the area using a circular motion.  ¨ Remove all the soap. Pat the area dry with a clean towel. For males, reposition the skin that covers the end of the penis (foreskin).  3. Attach the tube to your leg with tape or a leg strap. Do not stretch the tube tight. If you are using tape, remove any stickiness left behind by past tape you used.  4. Keep the drainage bag below your hips. Keep it off the floor.  5. Check your tube during the day. Make sure it is working and draining. Make sure the tube does not curl, twist, or bend.  6. Do not pull on the tube or try to take it out.  TAKING CARE OF THE DRAINAGE BAGS    You will have a large overnight drainage bag and a small leg bag. You may wear the overnight bag any time. Never wear the small bag at night. Follow the directions below.  Emptying the Drainage Bag  Empty your drainage bag when it is ?-½ full or at least 2-3 times a day.  1. Wash your hands with soap and water.  2. Keep the drainage bag below your hips.  3. Hold the dirty bag over the toilet or clean container.  4. Open the pour spout at the bottom of the bag. Empty the pee into the toilet or container. Do not let the pour spout touch anything.  5. Clean the pour spout with a gauze pad or cotton ball that has rubbing alcohol on it.  6. Close the pour spout.  7. Attach the bag to your leg with tape or a leg strap.  8. Wash your hands well.  Changing the Drainage Bag  Change your bag once a month or sooner if it starts to smell or look dirty.   1. Wash your hands with  soap and water.  2. Pinch the rubber tube so that pee does not spill out.  3. Disconnect the catheter tube from the drainage tube at the connection valve. Do not let the tubes touch anything.  4. Clean the end of the catheter tube with an alcohol wipe. Clean the end of a the drainage tube with a different alcohol wipe.  5. Connect the catheter tube to the drainage tube of the clean drainage bag.  6. Attach the new bag to the leg with tape or a leg strap. Avoid attaching the new bag too tightly.  7. Wash your hands well.  Cleaning the Drainage Bag  1. Wash your hands with soap and water.  2. Wash the bag in warm, soapy water.  3. Rinse the bag with warm water.  4. Fill the bag with a mixture of white vinegar and water (1 cup vinegar to 1 quart warm water [.2 liter vinegar to 1 liter warm water]). Close the bag and soak it for 30 minutes in the solution.  5. Rinse the bag with warm water.  6.   Hang the bag to dry with the pour spout open and hanging downward.  7. Store the clean bag (once it is dry) in a clean plastic bag.  8. Wash your hands well.  PREVENT INFECTION  · Wash your hands before and after touching your tube.  · Take showers every day. Wash the skin where the tube enters your body. Do not take baths. Replace wet leg straps with dry ones, if this applies.  · Do not use powders, sprays, or lotions on the genital area. Only use creams, lotions, or ointments as told by your doctor.  · For females, wipe from front to back after going to the bathroom.  · Drink enough fluids to keep your pee clear or pale yellow unless you are told not to have too much fluid (fluid restriction).  · Do not let the drainage bag or tubing touch or lie on the floor.  · Wear cotton underwear to keep the area dry.  GET HELP IF:  · Your pee is cloudy or smells unusually bad.  · Your tube becomes clogged.  · You are not draining pee into the bag or your bladder feels full.  · Your tube starts to leak.  GET HELP RIGHT AWAY IF:  · You have  pain, puffiness (swelling), redness, or yellowish-white fluid (pus) where the tube enters the body.  · You have pain in the belly (abdomen), legs, lower back, or bladder.  · You have a fever.  · You see blood fill the tube, or your pee is pink or red.  · You feel sick to your stomach (nauseous), throw up (vomit), or have chills.  · Your tube gets pulled out.  MAKE SURE YOU:   · Understand these instructions.  · Will watch your condition.  · Will get help right away if you are not doing well or get worse.     This information is not intended to replace advice given to you by your health care provider. Make sure you discuss any questions you have with your health care provider.     Document Released: 04/26/2012 Document Revised: 01/20/2014 Document Reviewed: 04/26/2012  Elsevier Interactive Patient Education ©2016 [employer]   

## 2015-01-26 NOTE — Progress Notes (Signed)
   [facility] Progress Note   Subjective: appetite better, feeling better. Creat down to 5.47, good UOP. FOley in place.   Filed Vitals:   01/25/15 2215 01/25/15 2345 01/26/15 0604 01/26/15 1353  BP: 132/101 140/90 107/49 102/58  Pulse: 85 93 94 86  Temp:  98.6 F (37 C) 98 F (36.7 C) 98.8 F (37.1 C)  TempSrc:  Oral Oral Oral  Resp: '16 18 18 20  '$ Height:  '5\' 6"'$  (1.676 m)    Weight:  86.955 kg (191 lb 11.2 oz) 86.8 kg (191 lb 5.8 oz)   SpO2: 100% 100% 100% 100%    Inpatient medications: . amLODipine  10 mg Oral Daily  . aspirin EC  81 mg Oral Daily  . carvedilol  12.5 mg Oral BID WC  . cefTRIAXone (ROCEPHIN)  IV  1 g Intravenous Q24H  . heparin  5,000 Units Subcutaneous 3 times per day  . rosuvastatin  40 mg Oral Daily  . sodium chloride  3 mL Intravenous Q12H     ondansetron **OR** ondansetron (ZOFRAN) IV  Exam: Alert, no distress  no jvd Chest clear bilat RRR no MRG ABd soft ntnd no mass or ascites Ext no edema NO joint effusions Neuro nonfocal, O x3  2011 - urethral dilatation after open heart surg, Dr Gaynelle Arabian Aug 2012 - back pain, bilat hydro by imaging, creat 1.9. Underwent repeat urethral dilatation.    Date  Creat   eGFR 2010  1.00 2011  1.17- 1.90 35 - >60 2012  1.20- 2.01  33 - >60 Feb 2014   1.16 Aug 2013 1.70 Jan 12  6.10 Jan 13 (today) 5.47  UA turbic, many bact, large LE, prot 100, rbc none, wbc tntc, trich, 0-5 epis CXR bibas atx US renal  L kidney 13.6 cm, severe chron hydro w cortical thinning. R kidney 12.0 cm, severe chron hydro w cortical thinning. Small cysts bilat.      Assessment: 1 Acute on CKD3 - creat down some today; severe bilat chron hydro by [place] and CT scan.  Urology following. Had large output from bladder after Foley placement 2 Hx urethral strictures 3 CAD hx CABG 2012 4 HTN on norvasc, coreg, lisinopril; ACEi on hold now.   Plan - cont IVF's, check creat in am.   Kelly Splinter MD [facility] pager [phone]    cell [phone] 01/26/2015, 4:14 PM    Recent Labs Lab 01/25/15 0258 01/25/15 0311 01/25/15 1312 01/26/15 0515  NA 140 141 143 141  K 6.2* 6.1* 6.3* 4.3  CL 114* 117* 117* 114*  CO2 15*  --  16* 18*  GLUCOSE 103* 98 107* 158*  BUN 61* 57* 64* 55*  CREATININE 6.16* 6.10* 5.91* 5.47*  CALCIUM 9.9  --  9.5 9.4  PHOS  --   --  5.7* 4.9*    Recent Labs Lab 01/25/15 1312 01/26/15 0515  ALBUMIN 2.9* 2.9*    Recent Labs Lab 01/25/15 0258 01/25/15 0311 01/26/15 0515  WBC 9.0  --  10.8*  NEUTROABS 5.4  --   --   HGB 9.5* 11.2* 9.9*  HCT 29.8* 33.0* 30.9*  MCV 83.7  --  82.8  PLT 482*  --  471*

## 2015-01-26 NOTE — Care Management Note (Signed)
Case Management Note  Patient Details  Name: [name] MRN: [id number] Date of Birth: Dec 22, 1945  Subjective/Objective:  70 y/o m admitted w/ARF. From home. PT cons-await recc.                  Action/Plan:d/c plan home.   Expected Discharge Date:   (unknown)               Expected Discharge Plan:  Home/Self Care  In-House Referral:     Discharge planning Services  CM Consult  Post Acute Care Choice:    Choice offered to:     DME Arranged:    DME Agency:     HH Arranged:    HH Agency:     Status of Service:  In process, will continue to follow  Medicare Important Message Given:    Date Medicare IM Given:    Medicare IM give by:    Date Additional Medicare IM Given:    Additional Medicare Important Message give by:     If discussed at [place] of Stay Meetings, dates discussed:    Additional Comments:  Dessa Phi, RN 01/26/2015, 12:06 PM

## 2015-01-26 NOTE — Progress Notes (Signed)
   Assessment/plan:  -Gross hematuria-very light.  Likely due to hemorrhagic cystitis from bladder distention.  Should clear on exam.  Patient will need follow-up cystoscopy in office. -Acute on chronic renal failure with bilateral Hydro, urethral stricture status post Foley placement.  Excellent urine output.  I'm going to obtain a noncontrast CT scan of the abdomen and pelvis to look for some improvement in the hydronephrosis, likely of obstruction, any other pathology such as stones lymphadenopathy or other bony metastases, etc. Will follow.     Subjective: Patient without complaint  Objective: Filed Vitals:   01/25/15 2345 01/26/15 0604  BP: 140/90 107/49  Pulse: 93 94  Temp: 98.6 F (37 C) 98 F (36.7 C)  Resp: 18 18    Intake/Output Summary (Last 24 hours) at 01/26/15 0841 Last data filed at 01/26/15 0700  Gross per 24 hour  Intake 1041.67 ml  Output   2200 ml  Net -1158.33 ml   Physical exam: Patient resting in bed.  Catheter in place.  Urine clear to light pink.  Another 700 cc in the bag. Clear urine in tubing.

## 2015-01-27 LAB — BASIC METABOLIC PANEL
ANION GAP: 11 (ref 5–15)
BUN: 50 mg/dL — ABNORMAL HIGH (ref 6–20)
CALCIUM: 8.9 mg/dL (ref 8.9–10.3)
CHLORIDE: 108 mmol/L (ref 101–111)
CO2: 20 mmol/L — AB (ref 22–32)
Creatinine, Ser: 4.96 mg/dL — ABNORMAL HIGH (ref 0.61–1.24)
GFR calc non Af Amer: 11 mL/min — ABNORMAL LOW (ref >60)
GFR, EST AFRICAN AMERICAN: 12 mL/min — AB (ref >60)
GLUCOSE: 97 mg/dL (ref 65–99)
Potassium: 3.6 mmol/L (ref 3.5–5.1)
Sodium: 139 mmol/L (ref 135–145)

## 2015-01-27 MED ORDER — ACETAMINOPHEN 325 MG PO TABS
650.0000 mg | ORAL_TABLET | Freq: Four times a day (QID) | ORAL | Status: DC | PRN
  Administered 2015-01-27: 650 mg via ORAL
  Filled 2015-01-27: qty 2

## 2015-01-27 MED ORDER — SODIUM CHLORIDE 0.9 % IV SOLN
INTRAVENOUS | Status: AC
  Administered 2015-01-27 (×2): via INTRAVENOUS

## 2015-01-27 NOTE — Progress Notes (Signed)
[facility] PROGRESS NOTE  Lindberg Blatz [id number] DOB: [birth year] DOA: 01/24/2015 PCP: Nadeen Landau., MD  Brief narrative 70 year old male with history of hypertension, CAD with history of MI in september 2011 with severe three-vessel disease, sustained VSD post MI and underwent CABG 4 with repair of VSD, cardiomyopathy with EF of 45% (as per echo in 2014), CKD stage III , history of urethral stricture (status post urethral stricture dilatation and catheter placement in 2012) presented [name] with 3 days of worsening left flank pain. In the ED vitals were stable. Blood work showed hemoglobin of 9.5, potassium 6.2 with bicarbonate of 15 and elevated creatinine of 6.16. Renal ultrasound done showed bilateral hydronephrosis with distended bladder. Renal and urology consulted. Acute on chronic bladder outlet obstruction suspected due to urethral stricture. urology placed a 57 French coud catheter on admission and patient admitted to hospitalist service.   Assessment/Plan: Acute on chronic kidney disease stage III secondary to bladder outlet obstruction Catheter placed on admission by urology.  . Renal function slowly starting to improve.. Patient denies further flank pain. Fluids changed to normal saline. -Patient having postobstructive diuresis (3700 mL past 24 hours)  Appreciate renal and urology follow-up.  Coronary artery disease with history of MI and cardiomyopathy Stable. Continue aspirin. Holding lisinopril. Continue Coreg and statin.  Metabolic acidosis with hyperkalemia Secondary to bladder outlet obstruction. Resolved.  DVT prophylaxis: Subcutaneous heparin Diet: Heart healthy  Code Status: Full code Family Communication: None at bedside Disposition Plan: Home once renal function improved appropriately possibly in the next 48-72 hours.   Consultants:  Renal (Dr. Florene Glen)  Urology (Dr. Junious Silk)  Procedures:  Renal  ultrasound  Antibiotics:  None  HPI/Subjective: Seen and examined. Denies any symptoms.  Objective: Filed Vitals:   01/26/15 2054 01/27/15 0515  BP: 96/61 94/63  Pulse: 76 78  Temp: 98.2 F (36.8 C) 98.9 F (37.2 C)  Resp: 18 18    Intake/Output Summary (Last 24 hours) at 01/27/15 1136 Last data filed at 01/27/15 1030  Gross per 24 hour  Intake 1757.08 ml  Output   3700 ml  Net -1942.92 ml   Filed Weights   01/25/15 2345 01/26/15 0604 01/27/15 0515  Weight: 86.955 kg (191 lb 11.2 oz) 86.8 kg (191 lb 5.8 oz) 88.451 kg (195 lb)    Exam:   General:  not in distress  HEENT: Moist Mucosa  Chest: Clear bilaterally  CVS: Normal S1 and S2, no murmurs rub or gallop  GI Soft, nondistended, nontender, foley  catheter in place draining clear urine  Musculoskeletal: Warm, no edema  Data Reviewed: Basic Metabolic Panel:  Recent Labs Lab 01/25/15 0258 01/25/15 0311 01/25/15 1312 01/26/15 0515 01/27/15 0513  NA 140 141 143 141 139  K 6.2* 6.1* 6.3* 4.3 3.6  CL 114* 117* 117* 114* 108  CO2 15*  --  16* 18* 20*  GLUCOSE 103* 98 107* 158* 97  BUN 61* 57* 64* 55* 50*  CREATININE 6.16* 6.10* 5.91* 5.47* 4.96*  CALCIUM 9.9  --  9.5 9.4 8.9  PHOS  --   --  5.7* 4.9*  --    Liver Function Tests:  Recent Labs Lab 01/25/15 1312 01/26/15 0515  ALBUMIN 2.9* 2.9*   No results for input(s): LIPASE, AMYLASE in the last 168 hours. No results for input(s): AMMONIA in the last 168 hours. CBC:  Recent Labs Lab 01/25/15 0258 01/25/15 0311 01/26/15 0515  WBC 9.0  --  10.8*  NEUTROABS 5.4  --   --  HGB 9.5* 11.2* 9.9*  HCT 29.8* 33.0* 30.9*  MCV 83.7  --  82.8  PLT 482*  --  471*   Cardiac Enzymes: No results for input(s): CKTOTAL, CKMB, CKMBINDEX, TROPONINI in the last 168 hours. BNP (last 3 results) No results for input(s): BNP in the last 8760 hours.  ProBNP (last 3 results) No results for input(s): PROBNP in the last 8760 hours.  CBG: No results for  input(s): GLUCAP in the last 168 hours.  Recent Results (from the past 240 hour(s))  Urine culture     Status: None   Collection Time: 01/25/15  2:31 AM  Result Value Ref Range Status   Specimen Description URINE, CLEAN CATCH  Final   Special Requests NONE  Final   Culture   Final    3,000 COLONIES/mL INSIGNIFICANT GROWTH Performed at [facility]    Report Status 01/26/2015 FINAL  Final     Studies: Ct Abdomen Pelvis Wo Contrast  01/26/2015  CLINICAL DATA:  Worsening left flank pain for 3 days. Bilateral hydronephrosis on ultrasound. History of coronary artery disease, chronic kidney disease and urethral stricture with stenting. EXAM: CT ABDOMEN AND PELVIS WITHOUT CONTRAST TECHNIQUE: Multidetector CT imaging of the abdomen and pelvis was performed following the standard protocol without IV contrast. COMPARISON:  Renal ultrasound 01/25/2015. Abdominal pelvic CT 08/29/2010 FINDINGS: Lower chest: Clear lung bases. No significant pleural or pericardial effusion. Dense calcifications are again noted at cardiac apex. While some of these appear to be within the pericardium on the reformatted images, there is probable extension into the myocardium as well. Previous median sternotomy. Hepatobiliary: As evaluated in the noncontrast state, the liver appears unremarkable without focal abnormality. The gallbladder is contracted without evidence of gallstones, surrounding inflammation or associated biliary dilatation. Pancreas: Progressive atrophy and dilatation of the main pancreatic duct. No focal pancreatic mass or surrounding inflammatory change demonstrated on noncontrast imaging. Spleen: Normal in size without focal abnormality. Adrenals/Urinary Tract: Both adrenal glands appear normal. Chronic hydronephrosis and hydroureter again noted, now more symmetric. Both kidneys demonstrate perinephric soft tissue stranding. There is asymmetric ureteral wall thickening on the right. There is severe  concentric bladder wall thickening which has progressed. No urinary tract calculi are seen. Bladder catheter is in place. Stomach/Bowel: No evidence of bowel wall thickening, distention or surrounding inflammatory change. The appendix appears normal. Vascular/Lymphatic: Small retroperitoneal lymph nodes are noted, not pathologically enlarged. There is no progressive retroperitoneal soft tissue stranding. Mild aortoiliac atherosclerosis appears unchanged without associated aneurysm. Reproductive: Stable mild enlargement of the prostate gland with central dystrophic calcifications. Other: No evidence of abdominal wall mass or hernia. Musculoskeletal: No acute or significant osseous findings. IMPRESSION: 1. Chronic or recurrent bilateral hydronephrosis and hydroureter, similar to prior CT from 2012. There is progressive asymmetric right ureteral wall and bladder wall thickening. Findings could be secondary to chronic bladder outlet obstruction from reported urethral stricture, with superimposed chronic inflammation, although neoplasm cannot be excluded. Cystoscopy and consideration of retrograde ureteral evaluation recommended. 2. Stable aortoiliac atherosclerosis and mild retroperitoneal soft tissue stranding. No progressive retroperitoneal fibrosis identified. 3. Calcifications of the left cardiac apex, likely a combination of myocardial and pericardial calcification. Electronically Signed   By: Richardean Sale M.D.   On: 01/26/2015 16:48   Dg Chest 2 View  01/25/2015  CLINICAL DATA:  Kidney infection. Weakness. Fall yesterday. Metabolic acid E media. EXAM: CHEST - 2 VIEW COMPARISON:  Two-view chest x-ray 12/05/2009 FINDINGS: The heart size is exaggerated by low lung volumes. Median  sternotomy for CABG is noted. Mild bibasilar airspace disease is worse on the left. The upper lung fields are clear. There is no edema or effusion to suggest failure. The visualized soft tissues and bony thorax are unremarkable.  IMPRESSION: 1. Low lung volumes and mild bibasilar airspace opacities likely reflect atelectasis. Early infection is considered less likely, but not excluded. 2. Median sternotomy for CABG. Electronically Signed   By: San Morelle M.D.   On: 01/25/2015 12:06   US Renal  01/25/2015  CLINICAL DATA:  Acute renal failure. History of chronic hydronephrosis. Previous cystoscopy. EXAM: RENAL / URINARY TRACT ULTRASOUND COMPLETE COMPARISON:  Ultrasound 08/30/2010.  CT 08/29/2010. FINDINGS: Right Kidney: Length: 13.6 cm. There is severe chronic hydronephrosis with associated cortical thinning. There are small renal cysts, largest in the lower pole measuring 12 mm maximally. Left Kidney: Length: 12.0 cm. There is severe chronic hydronephrosis with associated cortical thinning. There are small renal cysts, largest measuring 12 mm in the upper pole. Bladder: Chronic bladder wall thickening and debris noted. Evaluation for ureteral jets not performed. IMPRESSION: Little change is seen from the prior studies of 2012. There is chronic bilateral hydronephrosis and hydroureter with bilateral renal cortical thinning. Persistent bladder wall thickening and debris, suggesting chronic bladder outlet obstruction. Electronically Signed   By: Richardean Sale M.D.   On: 01/25/2015 12:42    Scheduled Meds: . amLODipine  10 mg Oral Daily  . aspirin EC  81 mg Oral Daily  . carvedilol  12.5 mg Oral BID WC  . cefTRIAXone (ROCEPHIN)  IV  1 g Intravenous Q24H  . heparin  5,000 Units Subcutaneous 3 times per day  . rosuvastatin  40 mg Oral Daily  . sodium chloride  3 mL Intravenous Q12H   Continuous Infusions: . sodium chloride 75 mL/hr at 01/27/15 0857      Time spent: 25 minutes    Salem Mastrogiovanni, [place]  [facility] Pager [phone]. If 7PM-7AM, please contact night-coverage at [email or web address], password [facility] 01/27/2015, 11:36 AM  LOS: 2 days

## 2015-01-27 NOTE — Progress Notes (Signed)
  [facility] Progress Note   Subjective: appetite better, feeling better. Creat down 4.96. No compalints  Filed Vitals:   01/26/15 1800 01/26/15 2054 01/27/15 0515 01/27/15 1441  BP: 1'23/75 96/61 94/63 '$ 104/66  Pulse: 79 76 78 68  Temp: 98.7 F (37.1 C) 98.2 F (36.8 C) 98.9 F (37.2 C) 98.1 F (36.7 C)  TempSrc: Oral Oral Oral Oral  Resp: '18 18 18 20  '$ Height:      Weight:   88.451 kg (195 lb)   SpO2: 100% 100% 99% 100%    Inpatient medications: . amLODipine  10 mg Oral Daily  . aspirin EC  81 mg Oral Daily  . carvedilol  12.5 mg Oral BID WC  . cefTRIAXone (ROCEPHIN)  IV  1 g Intravenous Q24H  . heparin  5,000 Units Subcutaneous 3 times per day  . rosuvastatin  40 mg Oral Daily  . sodium chloride  3 mL Intravenous Q12H   . sodium chloride 75 mL/hr at 01/27/15 0857   acetaminophen, ondansetron **OR** ondansetron (ZOFRAN) IV  Exam: Alert, no distress  no jvd Chest clear bilat RRR no MRG ABd soft ntnd no mass or ascites Ext no edema NO joint effusions Neuro nonfocal, O x3  2011 - urethral dilatation after open heart surg, Dr Gaynelle Arabian Aug 2012 - back pain, bilat hydro by imaging, creat 1.9. Underwent repeat urethral dilatation.    Date  Creat   eGFR 2010  1.00 2011  1.17- 1.90 35 - >60 2012  1.20- 2.01  33 - >60 Feb 2014   1.16 Aug 2013 1.70 Jan 12  6.10 Jan 13 (today) 5.47  UA turbic, many bact, large LE, prot 100, rbc none, wbc tntc, trich, 0-5 epis CXR bibas atx US renal  L kidney 13.6 cm, severe chron hydro w cortical thinning. R kidney 12.0 cm, severe chron hydro w cortical thinning. Small cysts bilat.      Assessment: 1 Acute on CKD3 - creat down some today; severe bilat chron hydro by [place] and CT. Foley placed and given IVFs and creat down from 6.1 to 4.9.  Last creat here was Aug 2015 at 1.70.  By the look of his [place] the bilat hydro has potentially caused significant chronic renal damage, so not sure where his creatinine will end up.  2  Hx urethral strictures 3 CAD hx CABG 2012 4 HTN on norvasc, coreg, lisinopril; ACEi on hold now.   Plan - cont IVF's.    Kelly Splinter MD [facility] pager [phone]    cell [phone] 01/27/2015, 7:26 PM    Recent Labs Lab 01/25/15 1312 01/26/15 0515 01/27/15 0513  NA 143 141 139  K 6.3* 4.3 3.6  CL 117* 114* 108  CO2 16* 18* 20*  GLUCOSE 107* 158* 97  BUN 64* 55* 50*  CREATININE 5.91* 5.47* 4.96*  CALCIUM 9.5 9.4 8.9  PHOS 5.7* 4.9*  --     Recent Labs Lab 01/25/15 1312 01/26/15 0515  ALBUMIN 2.9* 2.9*    Recent Labs Lab 01/25/15 0258 01/25/15 0311 01/26/15 0515  WBC 9.0  --  10.8*  NEUTROABS 5.4  --   --   HGB 9.5* 11.2* 9.9*  HCT 29.8* 33.0* 30.9*  MCV 83.7  --  82.8  PLT 482*  --  471*
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
## 2015-01-28 DIAGNOSIS — I5022 Chronic systolic (congestive) heart failure: Secondary | ICD-10-CM

## 2015-01-28 LAB — BASIC METABOLIC PANEL
Anion gap: 9 (ref 5–15)
BUN: 39 mg/dL — ABNORMAL HIGH (ref 6–20)
CHLORIDE: 111 mmol/L (ref 101–111)
CO2: 21 mmol/L — ABNORMAL LOW (ref 22–32)
CREATININE: 4.04 mg/dL — AB (ref 0.61–1.24)
Calcium: 8.9 mg/dL (ref 8.9–10.3)
GFR, EST AFRICAN AMERICAN: 16 mL/min — AB (ref >60)
GFR, EST NON AFRICAN AMERICAN: 14 mL/min — AB (ref >60)
Glucose, Bld: 115 mg/dL — ABNORMAL HIGH (ref 65–99)
POTASSIUM: 3.9 mmol/L (ref 3.5–5.1)
SODIUM: 141 mmol/L (ref 135–145)

## 2015-01-28 NOTE — Progress Notes (Signed)
Urology consult note:  ID: 70 year old man admitted for worsening renal failure and gross hematuria.  We are following this patient for what appears to be obstructive uropathy.  Assessment/plan:  -Gross hematuria- clearing.  Likely due to hemorrhagic cystitis from bladder distention. Patient will need follow-up cystoscopy in office and potentially further evaluation under anesthesia to evaluate the entire urothelial lining which appeared to be thickened on the CT scan. -Acute on chronic renal failure with bilateral Hydro, urethral stricture status post Foley placement.  Excellent urine output. His creatinine has started to respond. There was no other explanation on his noncontrast CT scan for his bilateral hydronephrosis. He did have diffuse thickening of the urothelial lining suggestive of chronic obstruction/inflammation. I will defer to Dr. Junious Silk in terms of further management of this. However, at this point, the patient clearly needs to continue with his Foley catheter. This does not seem to be bothering him.    Subjective: Patient without complaint  Objective: Filed Vitals:   01/27/15 2148 01/28/15 0413  BP: 95/65 110/66  Pulse: 72 73  Temp: 99.7 F (37.6 C) 98.8 F (37.1 C)  Resp: 20 20    Intake/Output Summary (Last 24 hours) at 01/28/15 0934 Last data filed at 01/28/15 0500  Gross per 24 hour  Intake 3903.75 ml  Output   4975 ml  Net -1071.25 ml   Physical exam: Patient resting comfortably in bed.  Catheter in place.  Urine clear to clear.  No suprapubic tenderness

## 2015-01-28 NOTE — Progress Notes (Signed)
  [facility] Progress Note   Subjective: appetite better, feeling better. Creat down 4.04. No complaints  Filed Vitals:   01/27/15 1441 01/27/15 2148 01/28/15 0413 01/28/15 1313  BP: 104/66 95/65 110/66 94/65  Pulse: 68 72 73 68  Temp: 98.1 F (36.7 C) 99.7 F (37.6 C) 98.8 F (37.1 C) 98.6 F (37 C)  TempSrc: Oral Oral Oral Oral  Resp: '20 20 20 20  '$ Height:      Weight:   88.315 kg (194 lb 11.2 oz)   SpO2: 100% 100% 99% 99%    Inpatient medications: . amLODipine  10 mg Oral Daily  . aspirin EC  81 mg Oral Daily  . carvedilol  12.5 mg Oral BID WC  . cefTRIAXone (ROCEPHIN)  IV  1 g Intravenous Q24H  . heparin  5,000 Units Subcutaneous 3 times per day  . rosuvastatin  40 mg Oral Daily  . sodium chloride  3 mL Intravenous Q12H     acetaminophen, ondansetron **OR** ondansetron (ZOFRAN) IV  Exam: Alert, no distress  no jvd Chest clear bilat RRR no MRG ABd soft ntnd no mass or ascites Ext no edema NO joint effusions Neuro nonfocal, O x3  2011 - urethral dilatation after open heart surg, Dr Gaynelle Arabian Aug 2012 - back pain, bilat hydro by imaging, creat 1.9. Underwent repeat urethral dilatation.    Date  Creat   eGFR 2010  1.00 2011  1.17- 1.90 35 - >60 2012  1.20- 2.01  33 - >60 Feb 2014   1.16 Aug 2013 1.70 Jan 12  6.10 Jan 13 (today) 5.47  UA turbic, many bact, large LE, prot 100, rbc none, wbc tntc, trich, 0-5 epis CXR bibas atx US renal  L kidney 13.6 cm, severe chron hydro w cortical thinning. R kidney 12.0 cm, severe chron hydro w cortical thinning. Small cysts bilat.      Assessment: 1 Acute on CKD3 - due to bladder outlet obstruction w severe bilat chron hydronephrosis on [place]. Last creat 1.7 here in 2015.  Creat down from 6.3 > 4.0 here, good UOP. Hopefully will continue to improve. He should have f/u w nephrology at [facility]. Our office will schedule an appt for him to f/u in about 1 month. Will sign off. Keep him off of all ARB's and ACEi's  please. No nsaids also.  2 Hx urethral strictures 3 CAD hx CABG 2012 4 HTN on norvasc, coreg, lisinopril; ACEi on hold now.   Plan - as above.    Kelly Splinter MD [facility] pager [phone]    cell [phone] 01/28/2015, 5:11 PM    Recent Labs Lab 01/25/15 1312 01/26/15 0515 01/27/15 0513 01/28/15 0602  NA 143 141 139 141  K 6.3* 4.3 3.6 3.9  CL 117* 114* 108 111  CO2 16* 18* 20* 21*  GLUCOSE 107* 158* 97 115*  BUN 64* 55* 50* 39*  CREATININE 5.91* 5.47* 4.96* 4.04*  CALCIUM 9.5 9.4 8.9 8.9  PHOS 5.7* 4.9*  --   --     Recent Labs Lab 01/25/15 1312 01/26/15 0515  ALBUMIN 2.9* 2.9*    Recent Labs Lab 01/25/15 0258 01/25/15 0311 01/26/15 0515  WBC 9.0  --  10.8*  NEUTROABS 5.4  --   --   HGB 9.5* 11.2* 9.9*  HCT 29.8* 33.0* 30.9*  MCV 83.7  --  82.8  PLT 482*  --  471*

## 2015-01-28 NOTE — Progress Notes (Signed)
[facility] PROGRESS NOTE  [name] [id number] DOB: November 25, 1945 DOA: 01/24/2015 PCP: Nadeen Landau., MD  Brief narrative 70 year old male with history of hypertension, CAD with history of MI in september 2011 with severe three-vessel disease, sustained VSD post MI and underwent CABG 4 with repair of VSD, cardiomyopathy with EF of 45% (as per echo in 2014), CKD stage III , history of urethral stricture (status post urethral stricture dilatation and catheter placement in 2012) presented [name] ED with 3 days of worsening left flank pain. In the ED vitals were stable. Blood work showed hemoglobin of 9.5, potassium 6.2 with bicarbonate of 15 and elevated creatinine of 6.16. Renal ultrasound done showed bilateral hydronephrosis with distended bladder. Renal and urology consulted. Acute on chronic bladder outlet obstruction suspected due to urethral stricture. urology placed a 66 French coud catheter on admission and patient admitted to hospitalist service.   Assessment/Plan: Acute on chronic kidney disease stage III secondary to bladder outlet obstruction Catheter placed on admission by urology.  Marland Kitchen He'll function slowly improving and having postobstructive diuresis. (Almost 5 L last 24 hours) Appreciate renal and urology follow-up. Urology recommends to continue Foley upon discharge and patient will need follow-up cystoscopy as outpatient to evaluate the urothelial lining seen on CT.  Coronary artery disease with history of MI and cardiomyopathy Stable. Continue aspirin. Holding lisinopril. Continue Coreg and statin.  Metabolic acidosis with hyperkalemia Secondary to bladder outlet obstruction. Resolved.  DVT prophylaxis: Subcutaneous heparin Diet: Heart healthy  Code Status: Full code Family Communication: None at bedside Disposition Plan: Home once renal function improved appropriately possibly in the next 48-72 hours.   Consultants:  Renal (Dr. Florene Glen)  Urology  (Dr. Junious Silk)  Procedures:  Renal ultrasound  Antibiotics:  None  HPI/Subjective: Seen and examined. No Overnight issues.  Objective: Filed Vitals:   01/27/15 2148 01/28/15 0413  BP: 95/65 110/66  Pulse: 72 73  Temp: 99.7 F (37.6 C) 98.8 F (37.1 C)  Resp: 20 20    Intake/Output Summary (Last 24 hours) at 01/28/15 1250 Last data filed at 01/28/15 1048  Gross per 24 hour  Intake 3423.75 ml  Output   5075 ml  Net -1651.25 ml   Filed Weights   01/26/15 0604 01/27/15 0515 01/28/15 0413  Weight: 86.8 kg (191 lb 5.8 oz) 88.451 kg (195 lb) 88.315 kg (194 lb 11.2 oz)    Exam:   General:  not in distress  HEENT: Moist Mucosa  Chest: Clear bilaterally  CVS: Normal S1 and S2, no murmurs rub or gallop  GI Soft, nondistended, nontender, foley  catheter in place draining clear urine  Musculoskeletal: Warm, no edema  Data Reviewed: Basic Metabolic Panel:  Recent Labs Lab 01/25/15 0258 01/25/15 0311 01/25/15 1312 01/26/15 0515 01/27/15 0513 01/28/15 0602  NA 140 141 143 141 139 141  K 6.2* 6.1* 6.3* 4.3 3.6 3.9  CL 114* 117* 117* 114* 108 111  CO2 15*  --  16* 18* 20* 21*  GLUCOSE 103* 98 107* 158* 97 115*  BUN 61* 57* 64* 55* 50* 39*  CREATININE 6.16* 6.10* 5.91* 5.47* 4.96* 4.04*  CALCIUM 9.9  --  9.5 9.4 8.9 8.9  PHOS  --   --  5.7* 4.9*  --   --    Liver Function Tests:  Recent Labs Lab 01/25/15 1312 01/26/15 0515  ALBUMIN 2.9* 2.9*   No results for input(s): LIPASE, AMYLASE in the last 168 hours. No results for input(s): AMMONIA in the last 168 hours. CBC:  Recent Labs Lab 01/25/15 0258 01/25/15 0311 01/26/15 0515  WBC 9.0  --  10.8*  NEUTROABS 5.4  --   --   HGB 9.5* 11.2* 9.9*  HCT 29.8* 33.0* 30.9*  MCV 83.7  --  82.8  PLT 482*  --  471*   Cardiac Enzymes: No results for input(s): CKTOTAL, CKMB, CKMBINDEX, TROPONINI in the last 168 hours. BNP (last 3 results) No results for input(s): BNP in the last 8760 hours.  ProBNP  (last 3 results) No results for input(s): PROBNP in the last 8760 hours.  CBG: No results for input(s): GLUCAP in the last 168 hours.  Recent Results (from the past 240 hour(s))  Urine culture     Status: None   Collection Time: 01/25/15  2:31 AM  Result Value Ref Range Status   Specimen Description URINE, CLEAN CATCH  Final   Special Requests NONE  Final   Culture   Final    3,000 COLONIES/mL INSIGNIFICANT GROWTH Performed at [facility]    Report Status 01/26/2015 FINAL  Final     Studies: Ct Abdomen Pelvis Wo Contrast  01/26/2015  CLINICAL DATA:  Worsening left flank pain for 3 days. Bilateral hydronephrosis on ultrasound. History of coronary artery disease, chronic kidney disease and urethral stricture with stenting. EXAM: CT ABDOMEN AND PELVIS WITHOUT CONTRAST TECHNIQUE: Multidetector CT imaging of the abdomen and pelvis was performed following the standard protocol without IV contrast. COMPARISON:  Renal ultrasound 01/25/2015. Abdominal pelvic CT 08/29/2010 FINDINGS: Lower chest: Clear lung bases. No significant pleural or pericardial effusion. Dense calcifications are again noted at cardiac apex. While some of these appear to be within the pericardium on the reformatted images, there is probable extension into the myocardium as well. Previous median sternotomy. Hepatobiliary: As evaluated in the noncontrast state, the liver appears unremarkable without focal abnormality. The gallbladder is contracted without evidence of gallstones, surrounding inflammation or associated biliary dilatation. Pancreas: Progressive atrophy and dilatation of the main pancreatic duct. No focal pancreatic mass or surrounding inflammatory change demonstrated on noncontrast imaging. Spleen: Normal in size without focal abnormality. Adrenals/Urinary Tract: Both adrenal glands appear normal. Chronic hydronephrosis and hydroureter again noted, now more symmetric. Both kidneys demonstrate perinephric soft  tissue stranding. There is asymmetric ureteral wall thickening on the right. There is severe concentric bladder wall thickening which has progressed. No urinary tract calculi are seen. Bladder catheter is in place. Stomach/Bowel: No evidence of bowel wall thickening, distention or surrounding inflammatory change. The appendix appears normal. Vascular/Lymphatic: Small retroperitoneal lymph nodes are noted, not pathologically enlarged. There is no progressive retroperitoneal soft tissue stranding. Mild aortoiliac atherosclerosis appears unchanged without associated aneurysm. Reproductive: Stable mild enlargement of the prostate gland with central dystrophic calcifications. Other: No evidence of abdominal wall mass or hernia. Musculoskeletal: No acute or significant osseous findings. IMPRESSION: 1. Chronic or recurrent bilateral hydronephrosis and hydroureter, similar to prior CT from 2012. There is progressive asymmetric right ureteral wall and bladder wall thickening. Findings could be secondary to chronic bladder outlet obstruction from reported urethral stricture, with superimposed chronic inflammation, although neoplasm cannot be excluded. Cystoscopy and consideration of retrograde ureteral evaluation recommended. 2. Stable aortoiliac atherosclerosis and mild retroperitoneal soft tissue stranding. No progressive retroperitoneal fibrosis identified. 3. Calcifications of the left cardiac apex, likely a combination of myocardial and pericardial calcification. Electronically Signed   By: Richardean Sale M.D.   On: 01/26/2015 16:48    Scheduled Meds: . amLODipine  10 mg Oral Daily  . aspirin EC  81 mg Oral Daily  . carvedilol  12.5 mg Oral BID WC  . cefTRIAXone (ROCEPHIN)  IV  1 g Intravenous Q24H  . heparin  5,000 Units Subcutaneous 3 times per day  . rosuvastatin  40 mg Oral Daily  . sodium chloride  3 mL Intravenous Q12H   Continuous Infusions:      Time spent: 25 minutes    Mikiya Nebergall,  [place]  [facility] Pager [phone]. If 7PM-7AM, please contact night-coverage at [email or web address], password [facility] 01/28/2015, 12:50 PM  LOS: 3 days

## 2015-01-29 DIAGNOSIS — R358 Other polyuria: Secondary | ICD-10-CM

## 2015-01-29 LAB — BASIC METABOLIC PANEL
ANION GAP: 9 (ref 5–15)
BUN: 34 mg/dL — ABNORMAL HIGH (ref 6–20)
CHLORIDE: 110 mmol/L (ref 101–111)
CO2: 20 mmol/L — AB (ref 22–32)
CREATININE: 3.4 mg/dL — AB (ref 0.61–1.24)
Calcium: 9.4 mg/dL (ref 8.9–10.3)
GFR calc non Af Amer: 17 mL/min — ABNORMAL LOW (ref >60)
GFR, EST AFRICAN AMERICAN: 19 mL/min — AB (ref >60)
Glucose, Bld: 100 mg/dL — ABNORMAL HIGH (ref 65–99)
POTASSIUM: 3.9 mmol/L (ref 3.5–5.1)
SODIUM: 139 mmol/L (ref 135–145)

## 2015-01-29 NOTE — Care Management Note (Signed)
Case Management Note  Patient Details  Name: [name] MRN: [id number] Date of Birth: [birth year]  Subjective/Objective: PT-recc HHPT. Will provide [facility] agency list await choice.                   Action/Plan:d/c plan home w/HHC.   Expected Discharge Date:   (unknown)               Expected Discharge Plan:  [place]  In-House Referral:     Discharge planning Services  CM Consult  Post Acute Care Choice:    Choice offered to:     DME Arranged:    DME Agency:     HH Arranged:    HH Agency:     Status of Service:  In process, will continue to follow  Medicare Important Message Given:  Yes Date Medicare IM Given:    Medicare IM give by:    Date Additional Medicare IM Given:    Additional Medicare Important Message give by:     If discussed at [place] of Stay Meetings, dates discussed:    Additional Comments:  Dessa Phi, RN 01/29/2015, 11:21 AM

## 2015-01-29 NOTE — Care Management Note (Signed)
Case Management Note  Patient Details  Name: My Sidel MRN: [id number] Date of Birth: 1945/09/07  Subjective/Objective:  Provided w/HHC agency list, await choice.Await order,HHPT, & straight angle cane.                  Action/Plan:d/c plan home w/HHC.   Expected Discharge Date:   (unknown)               Expected Discharge Plan:  [place]  In-House Referral:     Discharge planning Services  CM Consult  Post Acute Care Choice:    Choice offered to:     DME Arranged:    DME Agency:     HH Arranged:    HH Agency:     Status of Service:  In process, will continue to follow  Medicare Important Message Given:  Yes Date Medicare IM Given:    Medicare IM give by:    Date Additional Medicare IM Given:    Additional Medicare Important Message give by:     If discussed at [place] of Stay Meetings, dates discussed:    Additional Comments:  Dessa Phi, RN 01/29/2015, 3:29 PM

## 2015-01-29 NOTE — Progress Notes (Signed)
[facility] PROGRESS NOTE  [name] [id number] DOB: 04/04/1945 DOA: 01/24/2015 PCP: Nadeen Landau., MD  Brief narrative 70 year old male with history of hypertension, CAD with history of MI in september 2011 with severe three-vessel disease, sustained VSD post MI and underwent CABG 4 with repair of VSD, cardiomyopathy with EF of 45% (as per echo in 2014), CKD stage III , history of urethral stricture (status post urethral stricture dilatation and catheter placement in 2012) presented [name] with 3 days of worsening left flank pain. In the ED vitals were stable. Blood work showed hemoglobin of 9.5, potassium 6.2 with bicarbonate of 15 and elevated creatinine of 6.16. Renal ultrasound done showed bilateral hydronephrosis with distended bladder. Renal and urology consulted. Acute on chronic bladder outlet obstruction suspected due to urethral stricture. urology placed a 84 French coud catheter on admission and patient admitted to hospitalist service.   Assessment/Plan: Acute on chronic kidney disease stage III secondary to bladder outlet obstruction Catheter placed on admission by urology.  . Renal function slowly improving and having postobstructive diuresis. (5 L last 24 hours, 7.3 L negative since admission) -Creatinine improving daily ( 6.16>>3.40) Appreciate renal and urology follow-up. Urology recommends to continue Foley upon discharge and patient will need follow-up cystoscopy as outpatient to evaluate the urothelial lining seen on CT.  Coronary artery disease with history of MI and cardiomyopathy Stable. Continue aspirin. Holding lisinopril. Continue Coreg and statin.  Metabolic acidosis with hyperkalemia Secondary to bladder outlet obstruction. Resolved.  DVT prophylaxis: Subcutaneous heparin Diet: Heart healthy  Code Status: Full code Family Communication: None at bedside Disposition Plan: Home once renal function improved appropriately possibly in the  next 48 hours   Consultants:  Renal (Dr. Florene Glen)  Urology (Dr. Junious Silk)  Procedures:  Renal ultrasound  Antibiotics:  None  HPI/Subjective: Seen and examined. Eyes any symptoms.  Objective: Filed Vitals:   01/28/15 2012 01/29/15 0543  BP: 110/68 114/68  Pulse: 78 64  Temp: 99.4 F (37.4 C) 97.8 F (36.6 C)  Resp: 20 20    Intake/Output Summary (Last 24 hours) at 01/29/15 1223 Last data filed at 01/29/15 0818  Gross per 24 hour  Intake   1680 ml  Output   5450 ml  Net  -3770 ml   Filed Weights   01/27/15 0515 01/28/15 0413 01/29/15 0349  Weight: 88.451 kg (195 lb) 88.315 kg (194 lb 11.2 oz) 88.542 kg (195 lb 3.2 oz)    Exam:   General:  not in distress  HEENT: Moist Mucosa  Chest: Clear bilaterally  CVS: Normal S1 and S2, no murmurs rub or gallop  GI Soft, nondistended, nontender, foley  catheter in place draining clear urine  Musculoskeletal: Warm, no edema  Data Reviewed: Basic Metabolic Panel:  Recent Labs Lab 01/25/15 1312 01/26/15 0515 01/27/15 0513 01/28/15 0602 01/29/15 0521  NA 143 141 139 141 139  K 6.3* 4.3 3.6 3.9 3.9  CL 117* 114* 108 111 110  CO2 16* 18* 20* 21* 20*  GLUCOSE 107* 158* 97 115* 100*  BUN 64* 55* 50* 39* 34*  CREATININE 5.91* 5.47* 4.96* 4.04* 3.40*  CALCIUM 9.5 9.4 8.9 8.9 9.4  PHOS 5.7* 4.9*  --   --   --    Liver Function Tests:  Recent Labs Lab 01/25/15 1312 01/26/15 0515  ALBUMIN 2.9* 2.9*   No results for input(s): LIPASE, AMYLASE in the last 168 hours. No results for input(s): AMMONIA in the last 168 hours. CBC:  Recent Labs Lab 01/25/15  [id number] 01/25/15 0311 01/26/15 0515  WBC 9.0  --  10.8*  NEUTROABS 5.4  --   --   HGB 9.5* 11.2* 9.9*  HCT 29.8* 33.0* 30.9*  MCV 83.7  --  82.8  PLT 482*  --  471*   Cardiac Enzymes: No results for input(s): CKTOTAL, CKMB, CKMBINDEX, TROPONINI in the last 168 hours. BNP (last 3 results) No results for input(s): BNP in the last 8760 hours.  ProBNP  (last 3 results) No results for input(s): PROBNP in the last 8760 hours.  CBG: No results for input(s): GLUCAP in the last 168 hours.  Recent Results (from the past 240 hour(s))  Urine culture     Status: None   Collection Time: 01/25/15  2:31 AM  Result Value Ref Range Status   Specimen Description URINE, CLEAN CATCH  Final   Special Requests NONE  Final   Culture   Final    3,000 COLONIES/mL INSIGNIFICANT GROWTH Performed at [facility]    Report Status 01/26/2015 FINAL  Final     Studies: No results found.  Scheduled Meds: . amLODipine  10 mg Oral Daily  . aspirin EC  81 mg Oral Daily  . carvedilol  12.5 mg Oral BID WC  . cefTRIAXone (ROCEPHIN)  IV  1 g Intravenous Q24H  . heparin  5,000 Units Subcutaneous 3 times per day  . rosuvastatin  40 mg Oral Daily  . sodium chloride  3 mL Intravenous Q12H   Continuous Infusions:      Time spent: 25 minutes    [name], [place]  [facility] Pager [phone]. If 7PM-7AM, please contact night-coverage at [email or web address], password [facility] 01/29/2015, 12:23 PM  LOS: 4 days

## 2015-01-30 ENCOUNTER — Telehealth: Payer: Self-pay

## 2015-01-30 DIAGNOSIS — N390 Urinary tract infection, site not specified: Secondary | ICD-10-CM

## 2015-01-30 DIAGNOSIS — E46 Unspecified protein-calorie malnutrition: Secondary | ICD-10-CM

## 2015-01-30 DIAGNOSIS — D649 Anemia, unspecified: Secondary | ICD-10-CM | POA: Insufficient documentation

## 2015-01-30 DIAGNOSIS — R1012 Left upper quadrant pain: Secondary | ICD-10-CM

## 2015-01-30 DIAGNOSIS — A599 Trichomoniasis, unspecified: Secondary | ICD-10-CM

## 2015-01-30 DIAGNOSIS — D509 Iron deficiency anemia, unspecified: Secondary | ICD-10-CM

## 2015-01-30 DIAGNOSIS — N1330 Unspecified hydronephrosis: Secondary | ICD-10-CM | POA: Insufficient documentation

## 2015-01-30 DIAGNOSIS — N139 Obstructive and reflux uropathy, unspecified: Secondary | ICD-10-CM

## 2015-01-30 DIAGNOSIS — E875 Hyperkalemia: Secondary | ICD-10-CM

## 2015-01-30 DIAGNOSIS — E872 Acidosis, unspecified: Secondary | ICD-10-CM | POA: Insufficient documentation

## 2015-01-30 LAB — BASIC METABOLIC PANEL
ANION GAP: 11 (ref 5–15)
BUN: 32 mg/dL — ABNORMAL HIGH (ref 6–20)
CALCIUM: 9.4 mg/dL (ref 8.9–10.3)
CO2: 22 mmol/L (ref 22–32)
CREATININE: 2.95 mg/dL — AB (ref 0.61–1.24)
Chloride: 105 mmol/L (ref 101–111)
GFR calc Af Amer: 23 mL/min — ABNORMAL LOW (ref >60)
GFR calc non Af Amer: 20 mL/min — ABNORMAL LOW (ref >60)
GLUCOSE: 104 mg/dL — AB (ref 65–99)
Potassium: 4.2 mmol/L (ref 3.5–5.1)
Sodium: 138 mmol/L (ref 135–145)

## 2015-01-30 MED ORDER — METRONIDAZOLE 500 MG PO TABS: 500.0000 mg | ORAL_TABLET | Freq: Two times a day (BID) | ORAL | Status: AC

## 2015-01-30 MED ORDER — CIPROFLOXACIN HCL 500 MG PO TABS
500.0000 mg | ORAL_TABLET | Freq: Two times a day (BID) | ORAL | Status: AC
Start: 2015-01-30 — End: 2015-01-31

## 2015-01-30 MED ORDER — AMLODIPINE BESYLATE 10 MG PO TABS: 5.0000 mg | ORAL_TABLET | Freq: Every day | ORAL | Status: DC

## 2015-01-30 NOTE — Telephone Encounter (Signed)
Message received from Dessa Phi, RN CM requesting a hospital follow up appointment for the patient as he does not have a PCP. An appointment was scheduled for 1/223/17 @ 1030 with Dr Jarold Song and the information was placed on the AVS.   Update provided to K.  Mahabir, RN CM.

## 2015-01-30 NOTE — Discharge Summary (Signed)
Physician Discharge Summary  [name] [id number] DOB: Mar 26, 1945 DOA: 01/24/2015  PCP: No primary care provider on file.   [name] on 02/05/2015 at 10:30 AM  Admit date: 01/24/2015 Discharge date: 01/30/2015  Time spent: 35 minutes  Recommendations for Outpatient Follow-up:  1. Discharge home with home health RN and PT. Patient being discharged on Foley catheter. 2. Patient will complete oral ciprofloxacin on 1/18 and seven-day course of oral Flagyl on 1/23. 3. Patient to follow-up with Dr. Junious Walsh in 1-2 weeks. Appointment arranged to establish care at the [facility] for next week. Needs renal function checked during outpatient visit.   Discharge Diagnoses:   Principal Problem:   Acute renal failure superimposed on stage 3 chronic kidney disease (HCC)  Active Problems:   Obstructive uropathy    UTI   Trichomonas infection.   CAD, AUTOLOGOUS BYPASS GRAFT   SYSTOLIC HEART FAILURE, CHRONIC   Ventricular septal defect   Acute left flank pain   Hyperkalemia   Thrombocytosis (HCC)   Metabolic acidosis, increased anion gap (IAG)    Discharge Condition: Fair  Diet recommendation: heart healthy  Filed Weights   01/28/15 0413 01/29/15 0349 01/30/15 0500  Weight: 88.315 kg (194 lb 11.2 oz) 88.542 kg (195 lb 3.2 oz) 87.862 kg (193 lb 11.2 oz)    History of present illness:  70 year old male with history of hypertension, CAD with history of MI in september 2011 with severe three-vessel disease, sustained VSD post MI and underwent CABG 4 with repair of VSD, cardiomyopathy with EF of 45% (as per echo in 2014), CKD stage III , history of urethral stricture (status post urethral stricture dilatation and catheter placement in 2012) presented [name] with 3 days of worsening left flank pain. In the ED vitals were stable. Blood work showed hemoglobin of 9.5, potassium 6.2 with bicarbonate of 15  and elevated creatinine of 6.16. Renal ultrasound done showed bilateral hydronephrosis with distended bladder. Renal and urology consulted. Acute on chronic bladder outlet obstruction suspected due to urethral stricture. urology placed a 77 French coud catheter on admission and patient admitted to hospitalist service.  Hospital Course:  Acute on chronic kidney disease stage III secondary to bladder outlet obstruction Catheter placed on admission by urology. . Renal function slowly improving and having postobstructive diuresis. (5 L last 24 hours, 7.3 L negative since admission) -Creatinine improving daily ( 6.16>>2.95). Should continue to improve daily. Patient encouraged on hydrating himself. Appreciate renal and urology follow-up. Urology recommends to continue Foley upon discharge and patient will need follow-up cystoscopy as outpatient to evaluate the urothelial lining seen on CT. (possibly in the next 4 weeks). Dr. Junious Walsh will arrange outpatient follow-up in 1-2 weeks. -Patient should follow-up with nephrology as outpatient. Office will schedule for outpatient appointment in one month. -ACEi discontinued upon discharge and patient instructed to avoid NSAIDs.  Coronary artery disease with history of MI and cardiomyopathy Stable. Continue aspirin. Discontinued lisinopril. Continue Coreg and statin. Follow-up with his cardiologist as scheduled.  Metabolic acidosis with hyperkalemia Secondary to bladder outlet obstruction. Resolved.  UTI and Trichomonas infection Treating with empiric ciprofloxacin for 5 day course. Urine culture negative. Treating with empiric Flagyl 500 mg twice a day for 7 days for Trichomonas in urine. HIV antibody negative. GC probe negative. Patient reports that he has not had sexual intercourse in several months. Ideally his partner should also be treated and I have explained this to him.  Essential  hypertension Noted for some blood pressure. Amlodipine dose reduced  to 5 mg daily.  Protein calorie malnutrition Needs nutritional evaluation as outpatient.  Anemia Noted for low MCV. Please workup as outpatient.   Patient seen by PT and recommend home health. Without any home health PT and RN. Cane provided.    Code Status: Full code Family Communication: None at bedside Disposition Plan: Home with outpatient follow-up.   Consultants:  Renal (Dr. Florene Walsh)  Urology (Dr. Junious Walsh)  Procedures:  Renal ultrasound  Antibiotics:  None Discharge Exam: Filed Vitals:   01/29/15 2113 01/30/15 0503  BP: 102/65 97/75  Pulse: 65 69  Temp: 98.5 F (36.9 C) 97.8 F (36.6 C)  Resp: 18 19     General: Elderly male not in distress  HEENT: Moist Mucosa  Chest: Clear bilaterally  CVS: Normal S1 and S2, no murmurs rub or gallop  GI Soft, nondistended, nontender, foley catheter in place draining clear urine  Musculoskeletal: Warm, no edema  CNS: Alert and oriented  Discharge Instructions    Current Discharge Medication List    START taking these medications   Details  ciprofloxacin (CIPRO) 500 MG tablet Take 1 tablet (500 mg total) by mouth 2 (two) times daily. Qty: 4 tablet, Refills: 0    metroNIDAZOLE (FLAGYL) 500 MG tablet Take 1 tablet (500 mg total) by mouth 2 (two) times daily. Qty: 14 tablet, Refills: 0      CONTINUE these medications which have CHANGED   Details  amLODipine (NORVASC) 10 MG tablet Take 0.5 tablets (5 mg total) by mouth daily. Qty: 30 tablet, Refills: 11      CONTINUE these medications which have NOT CHANGED   Details  aspirin EC 81 MG tablet Take 1 tablet (81 mg total) by mouth daily. Qty: 90 tablet, Refills: 3   Associated Diagnoses: Coronary atherosclerosis of autologous vein bypass graft without angina    carvedilol (COREG) 12.5 MG tablet take 1 tablet by mouth twice a day with meals Qty: 60 tablet, Refills: 11    rosuvastatin (CRESTOR) 40 MG tablet take 1 tablet by mouth once daily Qty:  30 tablet, Refills: 11      STOP taking these medications     lisinopril (PRINIVIL,ZESTRIL) 10 MG tablet        No Known Allergies Follow-up Information    Follow up with Donald Aloe, MD.   Specialty:  Urology   Why:  1-2 weeks    Contact information:   [place] [address] [phone]       Follow up with [facility].   Why:  office will call with appt   Contact information:   [place] [address] [phone]        The results of significant diagnostics from this hospitalization (including imaging, microbiology, ancillary and laboratory) are listed below for reference.    Significant Diagnostic Studies: Ct Abdomen Pelvis Wo Contrast  01/26/2015  CLINICAL DATA:  Worsening left flank pain for 3 days. Bilateral hydronephrosis on ultrasound. History of coronary artery disease, chronic kidney disease and urethral stricture with stenting. EXAM: CT ABDOMEN AND PELVIS WITHOUT CONTRAST TECHNIQUE: Multidetector CT imaging of the abdomen and pelvis was performed following the standard protocol without IV contrast. COMPARISON:  Renal ultrasound 01/25/2015. Abdominal pelvic CT 08/29/2010 FINDINGS: Lower chest: Clear lung bases. No significant pleural or pericardial effusion. Dense calcifications are again noted at cardiac apex. While some of these appear to be within the pericardium on the reformatted images,  there is probable extension into the myocardium as well. Previous median sternotomy. Hepatobiliary: As evaluated in the noncontrast state, the liver appears unremarkable without focal abnormality. The gallbladder is contracted without evidence of gallstones, surrounding inflammation or associated biliary dilatation. Pancreas: Progressive atrophy and dilatation of the main pancreatic duct. No focal pancreatic mass or surrounding inflammatory change demonstrated on noncontrast imaging. Spleen: Normal in size without focal abnormality. Adrenals/Urinary  Tract: Both adrenal glands appear normal. Chronic hydronephrosis and hydroureter again noted, now more symmetric. Both kidneys demonstrate perinephric soft tissue stranding. There is asymmetric ureteral wall thickening on the right. There is severe concentric bladder wall thickening which has progressed. No urinary tract calculi are seen. Bladder catheter is in place. Stomach/Bowel: No evidence of bowel wall thickening, distention or surrounding inflammatory change. The appendix appears normal. Vascular/Lymphatic: Small retroperitoneal lymph nodes are noted, not pathologically enlarged. There is no progressive retroperitoneal soft tissue stranding. Mild aortoiliac atherosclerosis appears unchanged without associated aneurysm. Reproductive: Stable mild enlargement of the prostate gland with central dystrophic calcifications. Other: No evidence of abdominal wall mass or hernia. Musculoskeletal: No acute or significant osseous findings. IMPRESSION: 1. Chronic or recurrent bilateral hydronephrosis and hydroureter, similar to prior CT from 2012. There is progressive asymmetric right ureteral wall and bladder wall thickening. Findings could be secondary to chronic bladder outlet obstruction from reported urethral stricture, with superimposed chronic inflammation, although neoplasm cannot be excluded. Cystoscopy and consideration of retrograde ureteral evaluation recommended. 2. Stable aortoiliac atherosclerosis and mild retroperitoneal soft tissue stranding. No progressive retroperitoneal fibrosis identified. 3. Calcifications of the left cardiac apex, likely a combination of myocardial and pericardial calcification. Electronically Signed   By: Richardean Sale M.D.   On: 01/26/2015 16:48   Dg Chest 2 View  01/25/2015  CLINICAL DATA:  Kidney infection. Weakness. Fall yesterday. Metabolic acid E media. EXAM: CHEST - 2 VIEW COMPARISON:  Two-view chest x-ray 12/05/2009 FINDINGS: The heart size is exaggerated by low lung  volumes. Median sternotomy for CABG is noted. Mild bibasilar airspace disease is worse on the left. The upper lung fields are clear. There is no edema or effusion to suggest failure. The visualized soft tissues and bony thorax are unremarkable. IMPRESSION: 1. Low lung volumes and mild bibasilar airspace opacities likely reflect atelectasis. Early infection is considered less likely, but not excluded. 2. Median sternotomy for CABG. Electronically Signed   By: San Morelle M.D.   On: 01/25/2015 12:06   US Renal  01/25/2015  CLINICAL DATA:  Acute renal failure. History of chronic hydronephrosis. Previous cystoscopy. EXAM: RENAL / URINARY TRACT ULTRASOUND COMPLETE COMPARISON:  Ultrasound 08/30/2010.  CT 08/29/2010. FINDINGS: Right Kidney: Length: 13.6 cm. There is severe chronic hydronephrosis with associated cortical thinning. There are small renal cysts, largest in the lower pole measuring 12 mm maximally. Left Kidney: Length: 12.0 cm. There is severe chronic hydronephrosis with associated cortical thinning. There are small renal cysts, largest measuring 12 mm in the upper pole. Bladder: Chronic bladder wall thickening and debris noted. Evaluation for ureteral jets not performed. IMPRESSION: Little change is seen from the prior studies of 2012. There is chronic bilateral hydronephrosis and hydroureter with bilateral renal cortical thinning. Persistent bladder wall thickening and debris, suggesting chronic bladder outlet obstruction. Electronically Signed   By: Richardean Sale M.D.   On: 01/25/2015 12:42   Dg Hip Unilat With Pelvis 2-3 Views Left  01/25/2015  CLINICAL DATA:  Left hip pain after injury. Tripped over grandchildren resulting in fall injuring left hip. EXAM: DG HIP (  WITH OR WITHOUT PELVIS) 2-3V LEFT COMPARISON:  None. FINDINGS: The cortical margins of the bony pelvis and left hip are intact. No fracture. Pubic symphysis and sacroiliac joints are congruent. Both femoral heads are well-seated  in the respective acetabula. IMPRESSION: No fracture or dislocation of the pelvis or left hip. Electronically Signed   By: Jeb Levering M.D.   On: 01/25/2015 00:14    Microbiology: Recent Results (from the past 240 hour(s))  Urine culture     Status: None   Collection Time: 01/25/15  2:31 AM  Result Value Ref Range Status   Specimen Description URINE, CLEAN CATCH  Final   Special Requests NONE  Final   Culture   Final    3,000 COLONIES/mL INSIGNIFICANT GROWTH Performed at [facility]    Report Status 01/26/2015 FINAL  Final     Labs: Basic Metabolic Panel:  Recent Labs Lab 01/25/15 1312 01/26/15 0515 01/27/15 0513 01/28/15 0602 01/29/15 0521 01/30/15 0432  NA 143 141 139 141 139 138  K 6.3* 4.3 3.6 3.9 3.9 4.2  CL 117* 114* 108 111 110 105  CO2 16* 18* 20* 21* 20* 22  GLUCOSE 107* 158* 97 115* 100* 104*  BUN 64* 55* 50* 39* 34* 32*  CREATININE 5.91* 5.47* 4.96* 4.04* 3.40* 2.95*  CALCIUM 9.5 9.4 8.9 8.9 9.4 9.4  PHOS 5.7* 4.9*  --   --   --   --    Liver Function Tests:  Recent Labs Lab 01/25/15 1312 01/26/15 0515  ALBUMIN 2.9* 2.9*   No results for input(s): LIPASE, AMYLASE in the last 168 hours. No results for input(s): AMMONIA in the last 168 hours. CBC:  Recent Labs Lab 01/25/15 0258 01/25/15 0311 01/26/15 0515  WBC 9.0  --  10.8*  NEUTROABS 5.4  --   --   HGB 9.5* 11.2* 9.9*  HCT 29.8* 33.0* 30.9*  MCV 83.7  --  82.8  PLT 482*  --  471*   Cardiac Enzymes: No results for input(s): CKTOTAL, CKMB, CKMBINDEX, TROPONINI in the last 168 hours. BNP: BNP (last 3 results) No results for input(s): BNP in the last 8760 hours.  ProBNP (last 3 results) No results for input(s): PROBNP in the last 8760 hours.  CBG: No results for input(s): GLUCAP in the last 168 hours.     Signed:  Louellen Molder MD.  [facility] 01/30/2015, 10:06 AM

## 2015-01-30 NOTE — Progress Notes (Addendum)
  Patient doing well.  Without complaint.  Filed Vitals:   01/29/15 2113 01/30/15 0503  BP: 102/65 97/75  Pulse: 65 69  Temp: 98.5 F (36.9 C) 97.8 F (36.6 C)  Resp: 18 19    Intake/Output Summary (Last 24 hours) at 01/30/15 1044 Last data filed at 01/30/15 1030  Gross per 24 hour  Intake   1320 ml  Output   4100 ml  Net  -2780 ml   PE: Urine clear, another 1,000 ml clear urine in bag  I reviewed the CT scan images which showed chronic hydroureteronephrosis all the way down to the bladder which was decompressed with a Foley.  Some thickening of the right bladder and ureter. No stones. I did feel like the hydronephrosis improved somewhat.  A/P - urethral stricture, Gross hematuria, bladder wall and ureteral thickening-I'll plan to see the patient back in the office in the next 10-14 days.  I discussed with him the nature and risks benefits and alternatives to exam under anesthesia, cystoscopy with retrogrades, possible and bladder biopsy and ureteroscopy with biopsy.  We also discussed nature risks benefits and alternatives to urethral stricture dilation and the likelihood of recurrence.  He will consider.  Stable for discharge from urologic point of view.

## 2015-01-30 NOTE — Care Management Note (Signed)
Case Management Note  Patient Details  Name: Sharone Agne MRN: [id number] Date of Birth: 1945/03/09  Subjective/Objective: [facility] HHRN/HHPT ordered-rep Santiago Glad aware of d/c. Home cane ordered-[facility] dme rep Lecretia aware. [facility] pcp appt set.                   Action/Plan:d/c home w/HHC.   Expected Discharge Date:   (unknown)               Expected Discharge Plan:  [place]  In-House Referral:     Discharge planning Services  CM Consult  Post Acute Care Choice:    Choice offered to:     DME Arranged:  Kasandra Knudsen DME Agency:  [place] Arranged:  RN, PT [facility] Agency:     Status of Service:  Completed, signed off  Medicare Important Message Given:  Yes Date Medicare IM Given:    Medicare IM give by:    Date Additional Medicare IM Given:    Additional Medicare Important Message give by:     If discussed at [place] of Stay Meetings, dates discussed:    Additional Comments:  Dessa Phi, RN 01/30/2015, 12:41 PM

## 2015-02-05 ENCOUNTER — Inpatient Hospital Stay: Payer: Self-pay | Admitting: Family Medicine

## 2015-02-07 ENCOUNTER — Telehealth: Payer: Self-pay | Admitting: Family Medicine

## 2015-02-07 NOTE — Telephone Encounter (Signed)
Patient called wanting to know the status of fax, in regards to Home health aid Please follow up.

## 2015-02-07 NOTE — Telephone Encounter (Signed)
Explained to patient that we had never seen him in the office and that he missed his appointment scheduled on 1/23.  Patient states he rescheduled.  Explained to patient that MD would address his questions at his first visit.

## 2015-02-14 ENCOUNTER — Encounter: Payer: Self-pay | Admitting: Family Medicine

## 2015-02-14 ENCOUNTER — Ambulatory Visit: Payer: Medicare Other | Attending: Family Medicine | Admitting: Family Medicine

## 2015-02-14 VITALS — BP 111/68 | HR 67 | Temp 97.6°F | Resp 18 | Ht 66.0 in | Wt 203.0 lb

## 2015-02-14 DIAGNOSIS — Z131 Encounter for screening for diabetes mellitus: Secondary | ICD-10-CM

## 2015-02-14 DIAGNOSIS — N179 Acute kidney failure, unspecified: Secondary | ICD-10-CM | POA: Diagnosis not present

## 2015-02-14 DIAGNOSIS — N183 Chronic kidney disease, stage 3 (moderate): Secondary | ICD-10-CM

## 2015-02-14 DIAGNOSIS — N359 Urethral stricture, unspecified: Secondary | ICD-10-CM | POA: Diagnosis not present

## 2015-02-14 LAB — BASIC METABOLIC PANEL
BUN: 23 mg/dL (ref 7–25)
CO2: 22 mmol/L (ref 20–31)
Calcium: 9.5 mg/dL (ref 8.6–10.3)
Chloride: 108 mmol/L (ref 98–110)
Creat: 1.87 mg/dL — ABNORMAL HIGH (ref 0.70–1.18)
GLUCOSE: 92 mg/dL (ref 65–99)
POTASSIUM: 4.8 mmol/L (ref 3.5–5.3)
Sodium: 138 mmol/L (ref 135–146)

## 2015-02-14 NOTE — Progress Notes (Signed)
   Subjective:    Patient ID: [name], male    DOB: 12/06/1945, 70 y.o.   MRN: [id number]  HPI Patient seen for hospital follow up; missed previously scheduled hospital follow up on 1/23.  He reports feeling well today. Denies any abdominal pain, fevers/chills, or penile pain.  Is eager to have foley catheter removed.   Reports home health RN/PT evaluation did take place; has been able to walk with assistance of a cane at home.   Of note, Acute Kidney Injury related to urethral strictures, creatinine improved after foley placement.   Review of Systems No fever/chills, no abd pain, no N/V/D, no penile pain. No cough, no dyspnea, no chest pain/pressure. Denies recent falls.     Objective:   Physical Exam Well appearing, no distress. HEENT Neck supple, no cervical adenopathy. No JVD.  COR Regular S1S2, no extra sounds.  PULM CLear bilaterally.  ABD Soft, nontender, nondistended.  GU: Foley in place; bag with yellow clear urine. 1+ bilateral pitting edema.  Ambulating easily with cane.        Assessment & Plan:   Urethral strictures with associated AKI: Improved in hospital while catheter in place.  To recheck [facility] today, for follow up with Dr Junious Silk in Urology as soon as possible. Call placed to his office during patient's visit, Urology to reach out to patient to assure follow up.    Remains off ACEI/ARB; also for follow up with Nephrology in the coming 1 month. Medications reviewed with patient.   Follow up in [facility] in the coming 1 month, or sooner as needed.   Dalbert Mayotte, MD

## 2015-02-14 NOTE — Assessment & Plan Note (Signed)
Patient for follow up with Urology for removal of foley catheter. Call placed to Urology office during today's visit at [facility], urology to call patient to schedule follow up.

## 2015-02-14 NOTE — Progress Notes (Signed)
Patient fell and hurt left hip, went to ED. Patient reports having kidney issues with a little damage. Patient reports having catheter with leg bag currently. Patient reports needing catheter taken out. Per patient, catheter should have been removed 3 days after discharge.   Patient denies pain at this time.

## 2015-02-14 NOTE — Patient Instructions (Signed)
It is a pleasure to see you today.   We are checking your blood work to see that your kidney function is continuing to improve.   Regarding your indwelling foley catheter and urethral strictures, it is important that you follow up with the Urologist, Dr. Junious Silk, within the following week.   Follow up with the kidney specialist (Nephrology) within the coming 3 weeks, as recommended by the nephrologist who saw you in the hospital.   Continue to take the medications you are taking: Coreg, Crestor, Amlodipine and aspirin.  Complete the remaining tablets of antibiotic given when you were discharged.   Follow up in this office at [place] in 1 month, or sooner as needed.

## 2015-02-15 ENCOUNTER — Telehealth: Payer: Self-pay

## 2015-02-15 DIAGNOSIS — N179 Acute kidney failure, unspecified: Secondary | ICD-10-CM

## 2015-02-15 DIAGNOSIS — N183 Chronic kidney disease, stage 3 unspecified: Secondary | ICD-10-CM

## 2015-02-15 NOTE — Telephone Encounter (Signed)
Nurse called patient, patient verified date of birth. Patient aware of improving kidney function from discharge. Nurse called urology yesterday, urology will call patient with appointment.  Nurse called [facility]. [facility] needs a referral for patient to see nephrology. Once [facility] receives referral they will contact patient with appointment.  Nurse will send message to provider for referral.

## 2015-02-15 NOTE — Telephone Encounter (Signed)
-----   Message from Willeen Niece, MD sent at 02/15/2015  8:50 AM EST ----- Please let patient know his kidney function continues to improve from discharge; please be sure he has follow up appointments with urology and nephrology as discussed in yesterday's office visit.   JB

## 2015-02-16 DIAGNOSIS — N358 Other urethral stricture: Secondary | ICD-10-CM | POA: Diagnosis not present

## 2015-02-16 DIAGNOSIS — Z0000 Encounter for general adult medical examination without abnormal findings: Secondary | ICD-10-CM | POA: Diagnosis not present

## 2015-02-16 DIAGNOSIS — R8271 Bacteriuria: Secondary | ICD-10-CM | POA: Diagnosis not present

## 2015-02-16 DIAGNOSIS — N1330 Unspecified hydronephrosis: Secondary | ICD-10-CM | POA: Diagnosis not present

## 2015-02-20 ENCOUNTER — Telehealth: Payer: Self-pay | Admitting: *Deleted

## 2015-02-20 NOTE — Telephone Encounter (Signed)
Medical Assistant left message on patient's home and cell voicemail. Voicemail states to give a call back to Rohen Kimes with [facility] at [phone].  

## 2015-02-20 NOTE — Telephone Encounter (Signed)
-----   Message from Willeen Niece, MD sent at 02/15/2015  8:50 AM EST ----- Please let patient know his kidney function continues to improve from discharge; please be sure he has follow up appointments with urology and nephrology as discussed in yesterday's office visit.   JB

## 2015-02-28 NOTE — Telephone Encounter (Signed)
Patient verified DOB Patient made aware of kidney function continuing to improve from D/C. Patient states he had an appointment with the nephrologist a week after his visit with Dr. Lindell Noe. MA could not note the appointment in the system at [facility]. Patient had no further questions at this time.

## 2015-03-05 ENCOUNTER — Other Ambulatory Visit: Payer: Self-pay | Admitting: Urology

## 2015-03-05 ENCOUNTER — Inpatient Hospital Stay: Payer: Self-pay | Admitting: Family Medicine

## 2015-03-07 ENCOUNTER — Other Ambulatory Visit: Payer: Self-pay | Admitting: Urology

## 2015-03-09 ENCOUNTER — Encounter (HOSPITAL_COMMUNITY)
Admission: RE | Admit: 2015-03-09 | Discharge: 2015-03-09 | Disposition: A | Discharge status: Home / Self Care | Payer: Medicare Other | Source: Ambulatory Visit | Attending: Urology | Admitting: Urology

## 2015-03-09 ENCOUNTER — Ambulatory Visit (HOSPITAL_COMMUNITY)
Admission: RE | Admit: 2015-03-09 | Discharge: 2015-03-09 | Disposition: A | Discharge status: Home / Self Care | Payer: Medicare Other | Source: Ambulatory Visit | Attending: Anesthesiology | Admitting: Anesthesiology

## 2015-03-09 ENCOUNTER — Encounter (HOSPITAL_COMMUNITY): Payer: Self-pay

## 2015-03-09 DIAGNOSIS — R9389 Abnormal findings on diagnostic imaging of other specified body structures: Secondary | ICD-10-CM

## 2015-03-09 DIAGNOSIS — Z01812 Encounter for preprocedural laboratory examination: Secondary | ICD-10-CM | POA: Insufficient documentation

## 2015-03-09 DIAGNOSIS — R938 Abnormal findings on diagnostic imaging of other specified body structures: Secondary | ICD-10-CM | POA: Insufficient documentation

## 2015-03-09 DIAGNOSIS — Z01810 Encounter for preprocedural cardiovascular examination: Secondary | ICD-10-CM | POA: Diagnosis not present

## 2015-03-09 HISTORY — DX: Obstructive and reflux uropathy, unspecified: N13.9

## 2015-03-09 HISTORY — DX: Pneumonia, unspecified organism: J18.9

## 2015-03-09 HISTORY — DX: Personal history of other medical treatment: Z92.89

## 2015-03-09 HISTORY — DX: Personal history of other endocrine, nutritional and metabolic disease: Z86.39

## 2015-03-09 HISTORY — DX: Heart failure, unspecified: I50.9

## 2015-03-09 HISTORY — DX: Complete traumatic metacarpophalangeal amputation of unspecified finger, initial encounter: S68.119A

## 2015-03-09 HISTORY — DX: Personal history of (corrected) congenital malformations of heart and circulatory system: Z87.74

## 2015-03-09 LAB — CBC
HEMATOCRIT: 29.1 % — AB (ref 39.0–52.0)
HEMOGLOBIN: 9.3 g/dL — AB (ref 13.0–17.0)
MCH: 26.6 pg (ref 26.0–34.0)
MCHC: 32 g/dL (ref 30.0–36.0)
MCV: 83.4 fL (ref 78.0–100.0)
Platelets: 396 10*3/uL (ref 150–400)
RBC: 3.49 MIL/uL — ABNORMAL LOW (ref 4.22–5.81)
RDW: 16.2 % — ABNORMAL HIGH (ref 11.5–15.5)
WBC: 6 10*3/uL (ref 4.0–10.5)

## 2015-03-09 LAB — BASIC METABOLIC PANEL
ANION GAP: 7 (ref 5–15)
BUN: 24 mg/dL — ABNORMAL HIGH (ref 6–20)
CHLORIDE: 110 mmol/L (ref 101–111)
CO2: 19 mmol/L — AB (ref 22–32)
Calcium: 9.3 mg/dL (ref 8.9–10.3)
Creatinine, Ser: 2.12 mg/dL — ABNORMAL HIGH (ref 0.61–1.24)
GFR calc Af Amer: 34 mL/min — ABNORMAL LOW (ref >60)
GFR, EST NON AFRICAN AMERICAN: 30 mL/min — AB (ref >60)
Glucose, Bld: 90 mg/dL (ref 65–99)
POTASSIUM: 4.6 mmol/L (ref 3.5–5.1)
Sodium: 136 mmol/L (ref 135–145)

## 2015-03-09 NOTE — Progress Notes (Addendum)
CBC and BMP results in epic per PAT visit 03/09/2015 sent to Dr Junious Silk

## 2015-03-09 NOTE — Patient Instructions (Signed)
[name]  03/09/2015   Your procedure is scheduled on: Thursday March 15, 2015  Report to [facility] Main  Entrance take [place]  elevators to 3rd floor to  [place] [facility] at 10:45 AM.  Call this number if you have problems the morning of surgery [phone]   Remember: ONLY 1 PERSON MAY GO WITH YOU TO SHORT STAY TO GET  READY MORNING OF [place].  Do not eat food After Midnight but may take clear liquids till 6:45 am day of surgery then nothing by mouth.      Take these medicines the morning of surgery with A SIP OF WATER: Amlodipine; Carvedilol                               You may not have any metal on your body including hair pins and              piercings  Do not wear jewelry, lotions, powders or colognes, deodorant                        Men may shave face and neck.   Do not bring valuables to the hospital. [place].  Contacts, dentures or bridgework may not be worn into surgery.      Patients discharged the day of surgery will not be allowed to drive home.  Name and phone number of your driver:[name] (son) and daughter in law [name]   _____________________________________________________________________             [facility] - Preparing for Surgery Before surgery, you can play an important role.  Because skin is not sterile, your skin needs to be as free of germs as possible.  You can reduce the number of germs on your skin by washing with CHG (chlorahexidine gluconate) soap before surgery.  CHG is an antiseptic cleaner which kills germs and bonds with the skin to continue killing germs even after washing. Please DO NOT use if you have an allergy to CHG or antibacterial soaps.  If your skin becomes reddened/irritated stop using the CHG and inform your nurse when you arrive at Short Stay. Do not shave (including legs and underarms) for at least 48 hours prior to the first CHG  shower.  You may shave your face/neck. Please follow these instructions carefully:  1.  Shower with CHG Soap the night before surgery and the  morning of Surgery.  2.  If you choose to wash your hair, wash your hair first as usual with your  normal  shampoo.  3.  After you shampoo, rinse your hair and body thoroughly to remove the  shampoo.                           4.  Use CHG as you would any other liquid soap.  You can apply chg directly  to the skin and wash                       Gently with a scrungie or clean washcloth.  5.  Apply the CHG Soap to your body ONLY FROM THE NECK  DOWN.   Do not use on face/ open                           Wound or open sores. Avoid contact with eyes, ears mouth and genitals (private parts).                       Wash face,  Genitals (private parts) with your normal soap.             6.  Wash thoroughly, paying special attention to the area where your surgery  will be performed.  7.  Thoroughly rinse your body with warm water from the neck down.  8.  DO NOT shower/wash with your normal soap after using and rinsing off  the CHG Soap.                9.  Pat yourself dry with a clean towel.            10.  Wear clean pajamas.            11.  Place clean sheets on your bed the night of your first shower and do not  sleep with pets. Day of Surgery : Do not apply any lotions/deodorants the morning of surgery.  Please wear clean clothes to the hospital/surgery center.  FAILURE TO FOLLOW THESE INSTRUCTIONS MAY RESULT IN THE CANCELLATION OF YOUR SURGERY PATIENT SIGNATURE_________________________________  NURSE SIGNATURE__________________________________  ________________________________________________________________________    CLEAR LIQUID DIET   Foods Allowed                                                                     Foods Excluded  Coffee and tea, regular and decaf                             liquids that you cannot  Plain Jell-O in any flavor                                              see through such as: Fruit ices (not with fruit pulp)                                     milk, soups, orange juice  Iced Popsicles                                    All solid food Carbonated beverages, regular and diet                                    Cranberry, grape and apple juices Sports drinks like Gatorade Lightly seasoned clear broth or consume(fat free) Sugar, honey syrup  Sample Menu Breakfast  Lunch                                     Supper Cranberry juice                    Beef broth                            Chicken broth Jell-O                                     Grape juice                           Apple juice Coffee or tea                        Jell-O                                      Popsicle                                                Coffee or tea                        Coffee or tea  _____________________________________________________________________

## 2015-03-09 NOTE — Progress Notes (Signed)
EKG/epic 01/25/2015 ECHO/epic 03/23/2012

## 2015-03-12 NOTE — Progress Notes (Addendum)
pts son called this am stating having difficulty obtaining prescriptions in relation to surgical procedure. This nurse instructed to contact pt surgeon's in regards to situation. Pts son verbalized understanding - phone number to surgeon's office given.

## 2015-03-14 NOTE — H&P (Signed)
History of Present Illness Referred by Dr. Laverle Patter. PCP Dr. Lindell Noe.     1- Urethral stricture, Urinary retention and bilateral hydronephrosis -- seen Jan 2017 with some left flank pain and a history of falling. He was found to have a creatinine of 6 and ultrasound showed bilateral hydronephrosis and a distended bladder. He reports He reports he typically voids with a good flow but has to go frequently. He denies dysuria or gross hematuria. I was just able to force than a 14 French catheter.     He underwent urethral stricture dilation and catheter placement in 2012. At that time his creatinine gone up to 2 and decreased to 1.2 after catheter placement.     2-gross hematuria-patient developed gross hematuria after catheter placement January 2017. Follow-up CT scan showed bilateral hydroureteronephrosis that might even improve some as well as asymmetric thickening of the right ureter and bladder wall.    Today, patient is seen for the above. The Foley catheter as been draining well. Urine has been clear. His creatinine has recovered somewhat. His February 2017 BUN was 23 and creatinine 1.87.   Past Medical History Problems  1. History of hypertension (Z86.79)  Surgical History Problems  1. History of Cystoscopy For Urethral Stricture 2. History of Cystoscopy For Urethral Stricture 3. History of Heart Surgery  Current Meds 1. Aspirin 81 MG TABS;  Therapy: (Recorded:03Feb2017) to Recorded 2. Carvedilol 12.5 MG Oral Tablet;  Therapy: (Recorded:04Sep2012) to Recorded 3. Crestor 40 MG Oral Tablet;  Therapy: (Recorded:04Sep2012) to Recorded  Allergies Medication  1. No Known Drug Allergies  Family History Problems  1. Family history of Family Health Status Number Of Children 2. Family history of Father Deceased At Age ___ 3. Family history of Mother Deceased At Age ___  Social History Problems    Denied: History of Alcohol Use (History)   Caffeine Use   Marital History -  Divorced   Never A Smoker  Review of Systems Genitourinary, constitutional, skin, eye, otolaryngeal, hematologic/lymphatic, cardiovascular, pulmonary, endocrine, musculoskeletal, gastrointestinal, neurological and psychiatric system(s) were reviewed and pertinent findings if present are noted and are otherwise negative.    Vitals Vital Signs [Data Includes: Last 1 Day]  Recorded: [id number] 03:36PM  Weight: 203 lb  BMI Calculated: 32.77 BSA Calculated: 2.01 Blood Pressure: 128 / 80 Temperature: 98.1 F Heart Rate: 65  Physical Exam Constitutional: Well nourished and well developed . No acute distress.  Neuro/Psych:. Mood and affect are appropriate.    Results/Data Urine [Data Includes: Last 1 Day]   [id number]  COLOR YELLOW   APPEARANCE CLEAR   SPECIFIC GRAVITY 1.015   pH 5.0   GLUCOSE NEGATIVE   BILIRUBIN NEGATIVE   KETONE NEGATIVE   BLOOD TRACE   PROTEIN 1+   NITRITE NEGATIVE   LEUKOCYTE ESTERASE 1+   SQUAMOUS EPITHELIAL/HPF 0-5 HPF  WBC 6-10 WBC/HPF  RBC 0-2 RBC/HPF  BACTERIA FEW HPF  CRYSTALS NONE SEEN HPF  CASTS NONE SEEN LPF  Yeast MODERATE HPF   Old records or history reviewed:Marland Kitchen  The following images/tracing/specimen were independently visualized: Marland Kitchen    Assessment Assessed  1. Bulbous urethral stricture (N35.8) 2. Bacteriuria, asymptomatic (R82.71) 3. Hydronephrosis (N13.30)  Plan  Bulbous urethral stricture  1. Follow-up Schedule Surgery Office  Follow-up  Status: Complete  Done: [id number] Health Maintenance  2. UA With REFLEX; [Do Not Release]; Status:Complete;   [id number] 03:11PM  URINE CULTURE; Status:In Progress - Specimen/Data Collected;  Done: [id number] Perform:[facility]; Due:05Feb2017; Marked Important; Last Updated [id number], Debbie; 02/16/2015  4:19:38 PM;Ordered; Today;  [id number], asymptomatic; Ordered [id number], Taiylor Virden;   Discussion/Summary urethral stricture , bilateral HUN , gross hematuria-we will plan exam under anesthesia,  balloon dilation of urethral stricture with cystoscopy, bladder biopsy, retrograde grams and possible ureteroscopy. Discussed alternatives to these procedures. Patient elects to proceed. I sent urine for culture.       Signatures Electronically signed by : Festus Aloe, M.D.; Feb 16 2015  4:24PM EST Electronically signed by : Festus Aloe, M.D.; Feb 16 2015  4:25PM EST  Addendum: Creatinine has improved to 2.12.  Urine culture with mixed multiple species.  I sent Bactrim for patient to start a few days before the procedure.

## 2015-03-26 NOTE — Patient Instructions (Addendum)
[name]  03/26/2015   Your procedure is scheduled on: 04/03/2015    Report to [facility] Main  Entrance take [place]  elevators to 3rd floor to  [place] at    [place] AM.  Call this number if you have problems the morning of surgery [phone]   Remember: ONLY 1 PERSON MAY GO WITH YOU TO SHORT STAY TO GET  READY MORNING OF [place].   Do not eat food or drink liquids :After Midnight.     Take these medicines the morning of surgery with A SIP OF WATER: Amlodipine ( Norvasc), Carvedilol ( Coreg)                                You may not have any metal on your body including hair pins and              piercings  Do not wear jewelry,  lotions, powders or perfumes, deodorant                       Men may shave face and neck.   Do not bring valuables to the hospital. [place].  Contacts, dentures or bridgework may not be worn into surgery.  .     Patients discharged the day of surgery will not be allowed to drive home.  Name and phone number of your driver: son -Hollice Espy or EJ  Special Instructions: coughing and deep breathing exercises, leg exercises               Please read over the following fact sheets you were given: _____________________________________________________________________                  USE DIAL SOAP TO BATH WITH THE NIGHT BEFORE SURGERY-04/02/2015 AND MORNING OF SURGERY-04/03/2015!          [facility] - Preparing for Surgery                           Before surgery, you can play an important role.  Because skin is not sterile, your skin needs to be as free of germs as possible.  You can reduce the number of germs on your skin by washing with CHG (chlorahexidine gluconate) soap before surgery.  CHG is an antiseptic cleaner which kills germs and bonds with the skin to continue killing germs even after washing. Please DO NOT use if you have an allergy to CHG or antibacterial  soaps.  If your skin becomes reddened/irritated stop using the CHG and inform your nurse when you arrive at Short Stay. Do not shave (including legs and underarms) for at least 48 hours prior to the first CHG shower.  You may shave your face/neck. Please follow these instructions carefully:  1.  Shower with CHG Soap the night before surgery and the  morning of Surgery.  2.  If you choose to wash your hair, wash your hair first as usual with your  normal  shampoo.  3.  After you shampoo, rinse your hair and body thoroughly to remove the  shampoo.  4.  Use CHG as you would any other liquid soap.  You can apply chg directly  to the skin and wash                       Gently with a scrungie or clean washcloth.  5.  Apply the CHG Soap to your body ONLY FROM THE NECK DOWN.   Do not use on face/ open                           Wound or open sores. Avoid contact with eyes, ears mouth and genitals (private parts).                       Wash face,  Genitals (private parts) with your normal soap.             6.  Wash thoroughly, paying special attention to the area where your surgery  will be performed.  7.  Thoroughly rinse your body with warm water from the neck down.  8.  DO NOT shower/wash with your normal soap after using and rinsing off  the CHG Soap.                9.  Pat yourself dry with a clean towel.            10.  Wear clean pajamas.            11.  Place clean sheets on your bed the night of your first shower and do not  sleep with pets. Day of Surgery : Do not apply any lotions/deodorants the morning of surgery.  Please wear clean clothes to the hospital/surgery center.  FAILURE TO FOLLOW THESE INSTRUCTIONS MAY RESULT IN THE CANCELLATION OF YOUR SURGERY PATIENT SIGNATURE_________________________________  NURSE SIGNATURE__________________________________  ________________________________________________________________________

## 2015-03-27 ENCOUNTER — Encounter (HOSPITAL_COMMUNITY): Payer: Self-pay

## 2015-03-27 ENCOUNTER — Telehealth: Payer: Self-pay | Admitting: Cardiology

## 2015-03-27 ENCOUNTER — Encounter (HOSPITAL_COMMUNITY)
Admission: RE | Admit: 2015-03-27 | Discharge: 2015-03-27 | Disposition: A | Discharge status: Home / Self Care | Payer: Medicare Other | Source: Ambulatory Visit | Attending: Urology | Admitting: Urology

## 2015-03-27 ENCOUNTER — Other Ambulatory Visit: Payer: Self-pay

## 2015-03-27 ENCOUNTER — Emergency Department (HOSPITAL_COMMUNITY)
Admission: EM | Admit: 2015-03-27 | Discharge: 2015-03-27 | Disposition: A | Discharge status: Home / Self Care | Payer: Medicare Other | Attending: Emergency Medicine | Admitting: Emergency Medicine

## 2015-03-27 DIAGNOSIS — Z87440 Personal history of urinary (tract) infections: Secondary | ICD-10-CM | POA: Insufficient documentation

## 2015-03-27 DIAGNOSIS — I2510 Atherosclerotic heart disease of native coronary artery without angina pectoris: Secondary | ICD-10-CM | POA: Diagnosis not present

## 2015-03-27 DIAGNOSIS — Z7982 Long term (current) use of aspirin: Secondary | ICD-10-CM | POA: Diagnosis not present

## 2015-03-27 DIAGNOSIS — Z8781 Personal history of (healed) traumatic fracture: Secondary | ICD-10-CM | POA: Diagnosis not present

## 2015-03-27 DIAGNOSIS — N1330 Unspecified hydronephrosis: Secondary | ICD-10-CM | POA: Insufficient documentation

## 2015-03-27 DIAGNOSIS — I10 Essential (primary) hypertension: Secondary | ICD-10-CM

## 2015-03-27 DIAGNOSIS — I509 Heart failure, unspecified: Secondary | ICD-10-CM | POA: Insufficient documentation

## 2015-03-27 DIAGNOSIS — N359 Urethral stricture, unspecified: Secondary | ICD-10-CM | POA: Diagnosis not present

## 2015-03-27 DIAGNOSIS — I129 Hypertensive chronic kidney disease with stage 1 through stage 4 chronic kidney disease, or unspecified chronic kidney disease: Secondary | ICD-10-CM | POA: Insufficient documentation

## 2015-03-27 DIAGNOSIS — I252 Old myocardial infarction: Secondary | ICD-10-CM | POA: Diagnosis not present

## 2015-03-27 DIAGNOSIS — Z79899 Other long term (current) drug therapy: Secondary | ICD-10-CM | POA: Diagnosis not present

## 2015-03-27 DIAGNOSIS — Z792 Long term (current) use of antibiotics: Secondary | ICD-10-CM | POA: Insufficient documentation

## 2015-03-27 DIAGNOSIS — Z8619 Personal history of other infectious and parasitic diseases: Secondary | ICD-10-CM | POA: Diagnosis not present

## 2015-03-27 DIAGNOSIS — Z951 Presence of aortocoronary bypass graft: Secondary | ICD-10-CM | POA: Insufficient documentation

## 2015-03-27 DIAGNOSIS — Z9181 History of falling: Secondary | ICD-10-CM | POA: Diagnosis not present

## 2015-03-27 DIAGNOSIS — E875 Hyperkalemia: Secondary | ICD-10-CM | POA: Insufficient documentation

## 2015-03-27 DIAGNOSIS — Z01812 Encounter for preprocedural laboratory examination: Secondary | ICD-10-CM | POA: Insufficient documentation

## 2015-03-27 DIAGNOSIS — R310 Gross hematuria: Secondary | ICD-10-CM | POA: Insufficient documentation

## 2015-03-27 DIAGNOSIS — N189 Chronic kidney disease, unspecified: Secondary | ICD-10-CM | POA: Diagnosis not present

## 2015-03-27 DIAGNOSIS — R799 Abnormal finding of blood chemistry, unspecified: Secondary | ICD-10-CM | POA: Diagnosis present

## 2015-03-27 DIAGNOSIS — Z87891 Personal history of nicotine dependence: Secondary | ICD-10-CM | POA: Insufficient documentation

## 2015-03-27 DIAGNOSIS — Z8701 Personal history of pneumonia (recurrent): Secondary | ICD-10-CM | POA: Diagnosis not present

## 2015-03-27 DIAGNOSIS — Z01818 Encounter for other preprocedural examination: Secondary | ICD-10-CM | POA: Diagnosis not present

## 2015-03-27 LAB — CBC WITH DIFFERENTIAL/PLATELET
Basophils Absolute: 0 10*3/uL (ref 0.0–0.1)
Basophils Relative: 0 %
EOS ABS: 0.2 10*3/uL (ref 0.0–0.7)
Eosinophils Relative: 4 %
HEMATOCRIT: 32.3 % — AB (ref 39.0–52.0)
HEMOGLOBIN: 10.1 g/dL — AB (ref 13.0–17.0)
LYMPHS ABS: 2.7 10*3/uL (ref 0.7–4.0)
LYMPHS PCT: 40 %
MCH: 26.5 pg (ref 26.0–34.0)
MCHC: 31.3 g/dL (ref 30.0–36.0)
MCV: 84.8 fL (ref 78.0–100.0)
MONOS PCT: 7 %
Monocytes Absolute: 0.5 10*3/uL (ref 0.1–1.0)
NEUTROS ABS: 3.3 10*3/uL (ref 1.7–7.7)
NEUTROS PCT: 49 %
Platelets: 389 10*3/uL (ref 150–400)
RBC: 3.81 MIL/uL — ABNORMAL LOW (ref 4.22–5.81)
RDW: 17.3 % — ABNORMAL HIGH (ref 11.5–15.5)
WBC: 6.7 10*3/uL (ref 4.0–10.5)

## 2015-03-27 LAB — BASIC METABOLIC PANEL
ANION GAP: 5 (ref 5–15)
Anion gap: 6 (ref 5–15)
BUN: 47 mg/dL — ABNORMAL HIGH (ref 6–20)
BUN: 44 mg/dL — AB (ref 6–20)
CHLORIDE: 119 mmol/L — AB (ref 101–111)
CO2: 16 mmol/L — ABNORMAL LOW (ref 22–32)
CO2: 16 mmol/L — AB (ref 22–32)
CREATININE: 2.14 mg/dL — AB (ref 0.61–1.24)
Calcium: 9.7 mg/dL (ref 8.9–10.3)
Calcium: 9.9 mg/dL (ref 8.9–10.3)
Chloride: 115 mmol/L — ABNORMAL HIGH (ref 101–111)
Creatinine, Ser: 2.14 mg/dL — ABNORMAL HIGH (ref 0.61–1.24)
GFR calc Af Amer: 34 mL/min — ABNORMAL LOW (ref >60)
GFR calc Af Amer: 34 mL/min — ABNORMAL LOW (ref >60)
GFR calc non Af Amer: 29 mL/min — ABNORMAL LOW (ref >60)
GFR, EST NON AFRICAN AMERICAN: 29 mL/min — AB (ref >60)
GLUCOSE: 98 mg/dL (ref 65–99)
GLUCOSE: 115 mg/dL — AB (ref 65–99)
POTASSIUM: 6.3 mmol/L — AB (ref 3.5–5.1)
Potassium: 5.8 mmol/L — ABNORMAL HIGH (ref 3.5–5.1)
Sodium: 136 mmol/L (ref 135–145)
Sodium: 141 mmol/L (ref 135–145)

## 2015-03-27 LAB — CBC
HEMATOCRIT: 31.8 % — AB (ref 39.0–52.0)
HEMOGLOBIN: 10.4 g/dL — AB (ref 13.0–17.0)
MCH: 26.7 pg (ref 26.0–34.0)
MCHC: 32.7 g/dL (ref 30.0–36.0)
MCV: 81.7 fL (ref 78.0–100.0)
Platelets: 431 10*3/uL — ABNORMAL HIGH (ref 150–400)
RBC: 3.89 MIL/uL — AB (ref 4.22–5.81)
RDW: 17.1 % — ABNORMAL HIGH (ref 11.5–15.5)
WBC: 6.8 10*3/uL (ref 4.0–10.5)

## 2015-03-27 MED ORDER — SODIUM CHLORIDE 0.9 % IV BOLUS (SEPSIS)
500.0000 mL | Freq: Once | INTRAVENOUS | Status: AC
  Administered 2015-03-27: 1000 mL via INTRAVENOUS

## 2015-03-27 MED ORDER — DEXTROSE 50 % IV SOLN
1.0000 | Freq: Once | INTRAVENOUS | Status: AC
  Administered 2015-03-27: 50 mL via INTRAVENOUS
  Filled 2015-03-27: qty 50

## 2015-03-27 MED ORDER — SODIUM POLYSTYRENE SULFONATE 15 GM/60ML PO SUSP
30.0000 g | Freq: Once | ORAL | Status: AC
  Administered 2015-03-27: 30 g via ORAL
  Filled 2015-03-27: qty 120

## 2015-03-27 MED ORDER — INSULIN ASPART 100 UNIT/ML IV SOLN
10.0000 [IU] | Freq: Once | INTRAVENOUS | Status: AC
Start: 2015-03-27 — End: 2015-03-27
  Administered 2015-03-27: 10 [IU] via INTRAVENOUS
  Filled 2015-03-27: qty 0.1

## 2015-03-27 NOTE — Telephone Encounter (Signed)
Spoke with pt, he voiced understanding to restart the carvedilol today. He reports he has some in the home. Patient voiced understanding also to continue to take during surgery.

## 2015-03-27 NOTE — Discharge Instructions (Signed)
Please read and follow all provided instructions.  Your diagnoses today include:  1. Hyperkalemia   2. Chronic kidney disease, unspecified stage   3. Essential hypertension     Tests performed today include:  Blood counts and electrolytes - shows high potassium  EKG - no problems seen from high potassium  Vital signs. See below for your results today.   Medications prescribed:   None  Take any prescribed medications only as directed.  Home care instructions:  Follow any educational materials contained in this packet.  Please stop taking LISINOPRIL until your high potassium is addressed with your heart doctor.   Follow-up instructions: Please follow-up with your heart doctor as soon as possible to discuss your high potassium and blood pressure medications which can contribute to high potassium.   Return instructions:   Please return to the Emergency Department if you experience worsening symptoms.   Please return if you have any other emergent concerns.  Additional Information:  Your vital signs today were: BP 131/82 mmHg   Pulse 76   Temp(Src) 98 F (36.7 C) (Oral)   Resp 16   SpO2 100% If your blood pressure (BP) was elevated above 135/85 this visit, please have this repeated by your doctor within one month. --------------

## 2015-03-27 NOTE — ED Provider Notes (Signed)
CSN: [id number]     Arrival date & time 03/27/15  1448 History   First MD Initiated Contact with Patient 03/27/15 1543     Chief Complaint  Patient presents with  . Abnormal Lab    (Consider location/radiation/quality/duration/timing/severity/associated sxs/prior Treatment) HPI Comments: Patient with history of MI, on ACE inhibitor for blood pressure, admission for acute on chronic renal failure with hyperkalemia and 01/2015 -- presents with complaint of hyperkalemia. Patient was at his urologist office today for routine preop evaluation. Patient is scheduled to have a cystoscopy performed next week. Potassium was found to be 6.3. Patient currently has no medical complaints including chest pain, shortness of breath. He currently has a Foley catheter in place. Onset of symptoms insidious. Nothing makes symptoms better or worse.   The history is provided by the patient.    Past Medical History  Diagnosis Date  . Acute MI, inferior wall ([place])   . Coronary artery disease     severe 3 vessel   . Hypertension   . History of tobacco abuse   . Urethral stricture due to infective diseases classified elsewhere   . Pneumonia     childhood   . Foley catheter in place   . History of blood transfusion   . S/P VSD repair     09/2009  . Amputation finger     right first finger top portion age 61  . Hip fracture, left ([place])     11/2014  . Fall   . Urinary tract infection   . Obstructive uropathy   . Trichomonas infection   . CHF (congestive heart failure) ([place])   . H/O hyperkalemia   . H/O thrombocytosis   . History of metabolic acidosis    Past Surgical History  Procedure Laterality Date  . Flexible cystoscopy    . Coronary artery bypass graft      times 4  . Repair of post infarction posterior ventricular septal defect     History reviewed. No pertinent family history. Social History  Substance Use Topics  . Smoking status: Former Smoker -- 1.50 packs/day for 45 years    Types:  Cigarettes    Quit date: 01/13/2010  . Smokeless tobacco: Never Used  . Alcohol Use: No    Review of Systems  Constitutional: Negative for fever.  HENT: Negative for rhinorrhea and sore throat.   Eyes: Negative for redness.  Respiratory: Negative for cough.   Cardiovascular: Negative for chest pain.  Gastrointestinal: Negative for nausea, vomiting, abdominal pain and diarrhea.  Genitourinary: Negative for dysuria.  Musculoskeletal: Negative for myalgias.  Skin: Negative for rash.  Neurological: Negative for headaches.    Allergies  Chlorhexidine gluconate  Home Medications   Prior to Admission medications   Medication Sig Start Date End Date Taking? Authorizing Provider  amLODipine (NORVASC) 10 MG tablet Take 0.5 tablets (5 mg total) by mouth daily. Patient taking differently: Take 10 mg by mouth daily.  01/30/15  Yes Nishant Dhungel, MD  aspirin EC 81 MG tablet Take 1 tablet (81 mg total) by mouth daily. 11/06/14  Yes Lelon Perla, MD  lisinopril (PRINIVIL,ZESTRIL) 10 MG tablet Take 10 mg by mouth daily.   Yes Historical Provider, MD  rosuvastatin (CRESTOR) 40 MG tablet take 1 tablet by mouth once daily 11/30/14  Yes Lelon Perla, MD  carvedilol (COREG) 12.5 MG tablet take 1 tablet by mouth twice a day with meals 11/30/14   Lelon Perla, MD  sulfamethoxazole-trimethoprim (BACTRIM DS,SEPTRA DS) 800-160  MG tablet take 1 tablet by mouth once daily *START 3 DAYS BEFORE UROLOGY PROCEDURE 03/12/15   Historical Provider, MD   BP 116/78 mmHg  Pulse 70  Temp(Src) 98 F (36.7 C) (Oral)  Resp 17  SpO2 99%   Physical Exam  Constitutional: He appears well-developed and well-nourished.  HENT:  Head: Normocephalic and atraumatic.  Eyes: Conjunctivae are normal. Right eye exhibits no discharge. Left eye exhibits no discharge.  Neck: Normal range of motion. Neck supple.  Cardiovascular: Normal rate, regular rhythm and normal heart sounds.   Pulmonary/Chest: Effort normal and  breath sounds normal.  Abdominal: Soft. There is no tenderness.  Neurological: He is alert.  Skin: Skin is warm and dry.  Psychiatric: He has a normal mood and affect.  Nursing note and vitals reviewed.   ED Course  Procedures (including critical care time) Labs Review Labs Reviewed  CBC WITH DIFFERENTIAL/PLATELET - Abnormal; Notable for the following:    RBC 3.81 (*)    Hemoglobin 10.1 (*)    HCT 32.3 (*)    RDW 17.3 (*)    All other components within normal limits  BASIC METABOLIC PANEL - Abnormal; Notable for the following:    Potassium 5.8 (*)    Chloride 119 (*)    CO2 16 (*)    Glucose, Bld 115 (*)    BUN 44 (*)    Creatinine, Ser 2.14 (*)    GFR calc non Af Amer 29 (*)    GFR calc Af Amer 34 (*)    All other components within normal limits    Imaging Review No results found. I have personally reviewed and evaluated these images and lab results as part of my medical decision-making.   3:53 PM Patient seen and examined. Work-up initiated. Awaiting EKG.  Pt is on ACE inhibitor.   ED ECG REPORT   Date: 03/27/2015  Rate: 67  Rhythm: normal sinus rhythm  QRS Axis: normal  Intervals: normal  ST/T Wave abnormalities: normal  Conduction Disutrbances:none  Narrative Interpretation: inferior q-waves  Old EKG Reviewed: unchanged  I have personally reviewed the EKG tracing and agree with the computerized printout as noted.   Vital signs reviewed and are as follows: BP 116/78 mmHg  Pulse 70  Temp(Src) 98 F (36.7 C) (Oral)  Resp 17  SpO2 99%  4:02 PM EKG reviewed. No t-wave changes.   4:49 PM Discussed with Dr. Ashok Cordia. Will treat with insulin/D50 and kayexalate. Anticipate d/c to home with PCP f/u for lab recheck. Will need to discontinue lisinopril.   7:24 PM patient stable. Updated on results and treatment.  Patient counseled to discontinue lisinopril at this time. He needs to call his cardiologist tomorrow for an appointment to discuss blood pressure  management and use of ACE inhibitor. He will also need to have his labs rechecked. This was discussed with patient and family at bedside. They verbalized understanding and agrees with plan.  Patient urged to return with worsening symptoms or other concerns. Patient verbalized understanding and agrees with plan.    MDM   Final diagnoses:  Hyperkalemia  Chronic kidney disease, unspecified stage  Essential hypertension   Patient with chronic kidney disease noted with hyperkalemia of 6.3 on routine labs prior to arrival today, was 5.8 when rechecked in emergency department. EKG does not demonstrate any changes consistent with hyperkalemia. Patient is otherwise asymptomatic. Chronic kidney disease is stable. Lisinopril may be contributing to hyperkalemia so will have patient discontinue until he can follow-up with his  cardiologist to manage his blood pressure medication. Blood pressure well controlled in emergency department tonight. Patient is anxious to return home.   Carlisle Cater, PA-C 03/27/15 [place], MD 03/28/15 [phone]

## 2015-03-27 NOTE — Progress Notes (Addendum)
CRITICAL VALUE ALERT  Critical value received:  [id number] am 03/27/2015  Date of notification: 03/27/2015  Time of notification:1145am  Critical value read back:yes  Nurse who received alert:Sharon Danny Lawless, RN  MD notified (1st page):  03/27/2015 at 1151 am-spoke with Norvel Richards, RN and will give message to Dr. Junious Silk ASAP Time of first page:  1151 am  MD notified (2nd page): [facility] 03/27/2015 at 1300  Time of second page:1300 03/27/2015- only spoke again to personnel on nurse line about critical value and she responded" will send message to [facility] and forward to Dr. Junious Silk who is in surgery at present and will be in the office this afternoon".  Responding MD: Norvel Richards, RN had spoke to Dr. Junious Silk  Time MD responded: 1424 03/27/2015-Gwen Nori Riis, RN from Dr. Lyndal Rainbow office called to inform me that Dr. Junious Silk had seen his labs and wants patient to go to the Emergency room. Gwen Neal,RN informed me that she had spoke with patient and informed him to go to the Emergency Room.

## 2015-03-27 NOTE — Telephone Encounter (Signed)
Spoke with day surgery, the pt was confused and at his pre-op visit he thought he was supposed to stop the carvedilol. He has not taken any carvedilol since 11-06-15. They requested we call the pt and restart his medication if appropriate. Will discuss with dr Stanford Breed

## 2015-03-27 NOTE — Progress Notes (Signed)
Called Dr. Jacalyn Lefevre office and spoke to [place] ,his nurse letting her know that when patient came in for Pre-operative visit for surgery , he informed the nurse that he stopped taking his Carvedilol on 03/09/2015 from his last visit . He said that he understood the nurse to instruct him to stop that medication that had a mark on the top of the container. He then states that maybe he misunderstood her about stopping the medication Carvedilol. Debra to inform Dr. Stanford Breed and notify patient of what to do.

## 2015-03-27 NOTE — ED Notes (Signed)
Pt in preop today for cystoscopy.  Called back and told that his potassium was high.  Told to come to ED.

## 2015-03-29 DIAGNOSIS — N358 Other urethral stricture: Secondary | ICD-10-CM | POA: Diagnosis not present

## 2015-03-29 DIAGNOSIS — Z0000 Encounter for general adult medical examination without abnormal findings: Secondary | ICD-10-CM | POA: Diagnosis not present

## 2015-03-29 DIAGNOSIS — R8271 Bacteriuria: Secondary | ICD-10-CM | POA: Diagnosis not present

## 2015-04-02 ENCOUNTER — Other Ambulatory Visit: Payer: Self-pay | Admitting: Urology

## 2015-04-02 NOTE — H&P (Signed)
History of Present Illness Referred by Dr. Laverle Patter. PCP Dr. Lindell Noe.     1- Urethral stricture, Urinary retention and bilateral hydronephrosis -- seen Jan 2017 with some left flank pain and a history of falling. He was found to have a creatinine of 6 and ultrasound showed bilateral hydronephrosis and a distended bladder. He reports He reports he typically voids with a good flow but has to go frequently. He denies dysuria or gross hematuria. I was just able to force than a 14 French catheter.     He underwent urethral stricture dilation and catheter placement in 2012. At that time his creatinine gone up to 2 and decreased to 1.2 after catheter placement.     2-gross hematuria-patient developed gross hematuria after catheter placement January 2017. Follow-up CT scan showed bilateral hydroureteronephrosis that might even improve some as well as asymmetric thickening of the right ureter and bladder wall.    3-CRI - His February 2017 BUN was 23 and creatinine 1.87.        March 2017 interval history  We've had trouble getting the patient back in for continued management of his urethral stricture disease. He's had some transportation issues and had to reschedule formal dilation in the OR. We've been trying to get him back in as soon as possible at least for Foley catheter change. His preoperative labs the other day showed hyperkalemia and I recommended he go to the emergency department for repeat labs and acute treatment if needed. Pt seen in the emergency department treated for hyperkalemia and his lisinopril was discontinued. His potassium went down to 5.8, BUN was stable at 41, creatinine at 2.14 for a GFR of 34.    Today, he is well without fever, chest pain or shortness of breath. I recommended a catheter change to ensure bladder drainage until his cystoscopy and retrogrades in the operating room the patient was insistent that have his Foley removed. We discussed risk of recurrent  retention, hydronephrosis and kidney failure all of which can be life-threatening. Again insistent to get catheter out.     Past Medical History Problems  1. History of hypertension (Z86.79) 2. History of myocardial infarction (I25.2)  Surgical History Problems  1. History of Cystoscopy For Urethral Stricture 2. History of Cystoscopy For Urethral Stricture 3. History of Heart Surgery  Current Meds 1. AmLODIPine Besylate 10 MG Oral Tablet;  Therapy: (Recorded:16Mar2017) to Recorded 2. Aspirin 81 MG TABS;  Therapy: (Recorded:03Feb2017) to Recorded 3. Carvedilol 12.5 MG Oral Tablet;  Therapy: (Recorded:04Sep2012) to Recorded 4. Crestor 40 MG Oral Tablet;  Therapy: (Recorded:04Sep2012) to Recorded  Allergies Medication  1. No Known Drug Allergies  Family History Problems  1. Family history of Family Health Status Number Of Children   3 sons 2. Family history of Father Deceased At Age ___ 3. Family history of Mother Deceased At Age ___  Social History Problems  1. Denied: History of Alcohol Use (History) 2. Caffeine Use   2 3. Marital History - Divorced 4. Never A Smoker  Vitals Vital Signs [Data Includes: Last 1 Day]  Recorded: [id number] 09:56AM  Blood Pressure: 147 / 84 Temperature: 97.8 F Heart Rate: 77  Physical Exam Cardiovascular: Heart rate and rhythm are normal . No peripheral edema.    Results/Data  Old records or history reviewed:Marland Kitchen    Procedure A urine sample was sent for culture. He was given Rocephin 1 g IM. His bladder was filled and the Foley catheter removed. The catheter was actually very clean. He voided  only about 10 mL. Discussed catheter replacement but patient insisted on drinking some fluids and trying to void on his own "at home".     Assessment Assessed  1. Bulbous urethral stricture (N35.8)  Plan Bacteriuria, asymptomatic  1. Administered: CefTRIAXone Sodium 1 GM Injection Solution Reconstituted Bulbous urethral stricture   2. Administer: CefTRIAXone Sodium 1 GM Injection Solution Reconstituted; INJECT 1  GM  Intramuscular; To Be Done: [id number] 3. Fill, Pull, Flow; Status:Complete;   [id number]  Discussion/Summary urethral stricture , bilateral HUN , gross hematuria - discussed again with the patient the nature risks benefits and alternatives to cystoscopy, urethral stricture dilation if needed, bladder biopsy, bilat RGP, URS if needed. I gave him the cystoscopy handout went over the anatomy. Discussed the importance of follow-up today if he cannot void.        cc: Dr. Laverle Patter     Amendment Patient returns and has been voiding with a weak stream all day. Only voiding small amounts. A bladder scan post void was 297 mL. He tried again to avoid but only got out about 50 mL. He was prepped and a new 27 [place] Foley was placed. Admits some initial resistance at the stricture but passed without difficulty. About 350 mL of urine was drained. It was clear with some sediment at the end. It was connected to gravity drainage   Signatures Electronically signed by : Festus Aloe, M.D.; Mar 30 2015  4:39PM EST  Add: Urine culture positive for enterococcus.  Patient started Levaquin on March 20.

## 2015-04-03 ENCOUNTER — Encounter (HOSPITAL_COMMUNITY)
Admission: RE | Disposition: A | Discharge status: Home / Self Care | Payer: Self-pay | Source: Ambulatory Visit | Attending: Urology

## 2015-04-03 ENCOUNTER — Ambulatory Visit (HOSPITAL_COMMUNITY): Payer: Medicare Other | Admitting: Anesthesiology

## 2015-04-03 ENCOUNTER — Encounter (HOSPITAL_COMMUNITY): Payer: Self-pay | Admitting: *Deleted

## 2015-04-03 ENCOUNTER — Ambulatory Visit (HOSPITAL_COMMUNITY)
Admission: RE | Admit: 2015-04-03 | Discharge: 2015-04-03 | Disposition: A | Discharge status: Home / Self Care | Payer: Medicare Other | Source: Ambulatory Visit | Attending: Urology | Admitting: Urology

## 2015-04-03 DIAGNOSIS — R3912 Poor urinary stream: Secondary | ICD-10-CM | POA: Diagnosis not present

## 2015-04-03 DIAGNOSIS — N3080 Other cystitis without hematuria: Secondary | ICD-10-CM | POA: Insufficient documentation

## 2015-04-03 DIAGNOSIS — Z79899 Other long term (current) drug therapy: Secondary | ICD-10-CM | POA: Insufficient documentation

## 2015-04-03 DIAGNOSIS — N342 Other urethritis: Secondary | ICD-10-CM | POA: Diagnosis not present

## 2015-04-03 DIAGNOSIS — Z87891 Personal history of nicotine dependence: Secondary | ICD-10-CM | POA: Insufficient documentation

## 2015-04-03 DIAGNOSIS — I340 Nonrheumatic mitral (valve) insufficiency: Secondary | ICD-10-CM | POA: Diagnosis not present

## 2015-04-03 DIAGNOSIS — I129 Hypertensive chronic kidney disease with stage 1 through stage 4 chronic kidney disease, or unspecified chronic kidney disease: Secondary | ICD-10-CM | POA: Diagnosis not present

## 2015-04-03 DIAGNOSIS — N368 Other specified disorders of urethra: Secondary | ICD-10-CM | POA: Insufficient documentation

## 2015-04-03 DIAGNOSIS — R310 Gross hematuria: Secondary | ICD-10-CM | POA: Diagnosis not present

## 2015-04-03 DIAGNOSIS — N1330 Unspecified hydronephrosis: Secondary | ICD-10-CM | POA: Diagnosis not present

## 2015-04-03 DIAGNOSIS — R338 Other retention of urine: Secondary | ICD-10-CM | POA: Diagnosis not present

## 2015-04-03 DIAGNOSIS — I252 Old myocardial infarction: Secondary | ICD-10-CM | POA: Diagnosis not present

## 2015-04-03 DIAGNOSIS — Z7982 Long term (current) use of aspirin: Secondary | ICD-10-CM | POA: Insufficient documentation

## 2015-04-03 DIAGNOSIS — N189 Chronic kidney disease, unspecified: Secondary | ICD-10-CM | POA: Diagnosis not present

## 2015-04-03 DIAGNOSIS — I10 Essential (primary) hypertension: Secondary | ICD-10-CM | POA: Diagnosis not present

## 2015-04-03 DIAGNOSIS — I2510 Atherosclerotic heart disease of native coronary artery without angina pectoris: Secondary | ICD-10-CM | POA: Insufficient documentation

## 2015-04-03 DIAGNOSIS — D494 Neoplasm of unspecified behavior of bladder: Secondary | ICD-10-CM | POA: Diagnosis not present

## 2015-04-03 DIAGNOSIS — N403 Nodular prostate with lower urinary tract symptoms: Secondary | ICD-10-CM | POA: Insufficient documentation

## 2015-04-03 DIAGNOSIS — N3000 Acute cystitis without hematuria: Secondary | ICD-10-CM | POA: Diagnosis not present

## 2015-04-03 DIAGNOSIS — R8271 Bacteriuria: Secondary | ICD-10-CM | POA: Diagnosis not present

## 2015-04-03 DIAGNOSIS — N359 Urethral stricture, unspecified: Secondary | ICD-10-CM | POA: Insufficient documentation

## 2015-04-03 DIAGNOSIS — N369 Urethral disorder, unspecified: Secondary | ICD-10-CM | POA: Diagnosis not present

## 2015-04-03 HISTORY — PX: CYSTOSCOPY W/ RETROGRADES: SHX1426

## 2015-04-03 HISTORY — PX: CYSTOSCOPY WITH URETHRAL DILATATION: SHX5125

## 2015-04-03 LAB — POCT I-STAT 4, (NA,K, GLUC, HGB,HCT)
GLUCOSE: 96 mg/dL (ref 65–99)
HCT: 30 % — ABNORMAL LOW (ref 39.0–52.0)
Hemoglobin: 10.2 g/dL — ABNORMAL LOW (ref 13.0–17.0)
Potassium: 4.5 mmol/L (ref 3.5–5.1)
Sodium: 140 mmol/L (ref 135–145)

## 2015-04-03 SURGERY — CYSTOSCOPY, WITH URETHRAL DILATION
Anesthesia: General

## 2015-04-03 MED ORDER — IOHEXOL 300 MG/ML  SOLN
INTRAMUSCULAR | Status: DC | PRN
  Administered 2015-04-03: 20 mL

## 2015-04-03 MED ORDER — LACTATED RINGERS IV SOLN
INTRAVENOUS | Status: DC | PRN
  Administered 2015-04-03: 07:00:00 via INTRAVENOUS

## 2015-04-03 MED ORDER — BELLADONNA ALKALOIDS-OPIUM 16.2-60 MG RE SUPP
RECTAL | Status: AC
  Filled 2015-04-03: qty 1

## 2015-04-03 MED ORDER — TAMSULOSIN HCL 0.4 MG PO CAPS: 0.4000 mg | ORAL_CAPSULE | Freq: Every day | ORAL | Status: DC

## 2015-04-03 MED ORDER — PROPOFOL 10 MG/ML IV BOLUS
INTRAVENOUS | Status: AC
  Filled 2015-04-03: qty 20

## 2015-04-03 MED ORDER — LEVOFLOXACIN 250 MG PO TABS: 250.0000 mg | ORAL_TABLET | Freq: Every day | ORAL | Status: DC

## 2015-04-03 MED ORDER — DEXAMETHASONE SODIUM PHOSPHATE 10 MG/ML IJ SOLN
INTRAMUSCULAR | Status: DC | PRN
  Administered 2015-04-03: 5 mg via INTRAVENOUS

## 2015-04-03 MED ORDER — SODIUM CHLORIDE 0.9 % IR SOLN
Status: DC | PRN
  Administered 2015-04-03: 1000 mL

## 2015-04-03 MED ORDER — EPHEDRINE SULFATE 50 MG/ML IJ SOLN
INTRAMUSCULAR | Status: AC
Start: 2015-04-03 — End: 2015-04-03
  Filled 2015-04-03: qty 1

## 2015-04-03 MED ORDER — EPHEDRINE SULFATE 50 MG/ML IJ SOLN
INTRAMUSCULAR | Status: DC | PRN
  Administered 2015-04-03: 10 mg via INTRAVENOUS
  Administered 2015-04-03: 5 mg via INTRAVENOUS
  Administered 2015-04-03 (×2): 10 mg via INTRAVENOUS
  Administered 2015-04-03: 5 mg via INTRAVENOUS
  Administered 2015-04-03: 10 mg via INTRAVENOUS
  Administered 2015-04-03: 5 mg via INTRAVENOUS
  Administered 2015-04-03: 10 mg via INTRAVENOUS

## 2015-04-03 MED ORDER — FENTANYL CITRATE (PF) 100 MCG/2ML IJ SOLN
INTRAMUSCULAR | Status: DC | PRN
  Administered 2015-04-03 (×2): 50 ug via INTRAVENOUS

## 2015-04-03 MED ORDER — EPHEDRINE SULFATE 50 MG/ML IJ SOLN
INTRAMUSCULAR | Status: AC
  Filled 2015-04-03: qty 1

## 2015-04-03 MED ORDER — STERILE WATER FOR IRRIGATION IR SOLN
Status: DC | PRN
  Administered 2015-04-03: 3000 mL

## 2015-04-03 MED ORDER — FENTANYL CITRATE (PF) 100 MCG/2ML IJ SOLN
INTRAMUSCULAR | Status: AC
  Filled 2015-04-03: qty 2

## 2015-04-03 MED ORDER — ONDANSETRON HCL 4 MG/2ML IJ SOLN: 4.0000 mg | Freq: Once | INTRAMUSCULAR | Status: DC | PRN

## 2015-04-03 MED ORDER — LIDOCAINE HCL (CARDIAC) 20 MG/ML IV SOLN
INTRAVENOUS | Status: DC | PRN
  Administered 2015-04-03: 60 mg via INTRAVENOUS

## 2015-04-03 MED ORDER — AMPICILLIN-SULBACTAM SODIUM 3 (2-1) G IJ SOLR
3.0000 g | Freq: Once | INTRAMUSCULAR | Status: AC
  Administered 2015-04-03: 3 g via INTRAVENOUS
  Filled 2015-04-03: qty 3

## 2015-04-03 MED ORDER — BELLADONNA ALKALOIDS-OPIUM 16.2-60 MG RE SUPP
RECTAL | Status: DC | PRN
  Administered 2015-04-03: 1 via RECTAL

## 2015-04-03 MED ORDER — ONDANSETRON HCL 4 MG/2ML IJ SOLN
INTRAMUSCULAR | Status: DC | PRN
  Administered 2015-04-03 (×4): 2 mg via INTRAVENOUS

## 2015-04-03 MED ORDER — HYDROMORPHONE HCL 1 MG/ML IJ SOLN: 0.2500 mg | INTRAMUSCULAR | Status: DC | PRN

## 2015-04-03 MED ORDER — PROPOFOL 10 MG/ML IV BOLUS
INTRAVENOUS | Status: DC | PRN
  Administered 2015-04-03: 150 mg via INTRAVENOUS

## 2015-04-03 MED ORDER — DEXAMETHASONE SODIUM PHOSPHATE 10 MG/ML IJ SOLN
INTRAMUSCULAR | Status: AC
  Filled 2015-04-03: qty 1

## 2015-04-03 MED ORDER — LIDOCAINE HCL (CARDIAC) 20 MG/ML IV SOLN
INTRAVENOUS | Status: AC
  Filled 2015-04-03: qty 5

## 2015-04-03 MED ORDER — MEPERIDINE HCL 50 MG/ML IJ SOLN: 6.2500 mg | INTRAMUSCULAR | Status: DC | PRN

## 2015-04-03 MED ORDER — ONDANSETRON HCL 4 MG/2ML IJ SOLN
INTRAMUSCULAR | Status: AC
  Filled 2015-04-03: qty 4

## 2015-04-03 SURGICAL SUPPLY — 25 items
BAG URINE DRAINAGE (UROLOGICAL SUPPLIES) IMPLANT
BAG URINE LEG 500ML (DRAIN) ×2 IMPLANT
BAG URO CATCHER STRL LF (MISCELLANEOUS) ×3 IMPLANT
CATH INTERMIT  6FR 70CM (CATHETERS) ×3 IMPLANT
CATH TIEMANN FOLEY 18FR 5CC (CATHETERS) ×2 IMPLANT
CATH URET 5FR 28IN CONE TIP (BALLOONS) ×2
CATH URET 5FR 70CM CONE TIP (BALLOONS) IMPLANT
CLOTH BEACON ORANGE TIMEOUT ST (SAFETY) ×3 IMPLANT
FIBER LASER FLEXIVA 1000 (UROLOGICAL SUPPLIES) IMPLANT
FIBER LASER FLEXIVA 200 (UROLOGICAL SUPPLIES) IMPLANT
FIBER LASER FLEXIVA 365 (UROLOGICAL SUPPLIES) IMPLANT
FIBER LASER FLEXIVA 550 (UROLOGICAL SUPPLIES) IMPLANT
FIBER LASER TRAC TIP (UROLOGICAL SUPPLIES) IMPLANT
GLOVE BIOGEL M STRL SZ7.5 (GLOVE) ×5 IMPLANT
GLOVE BIOGEL PI IND STRL 7.0 (GLOVE) IMPLANT
GLOVE BIOGEL PI INDICATOR 7.0 (GLOVE) ×6
GOWN STRL REUS W/TWL LRG LVL3 (GOWN DISPOSABLE) ×7 IMPLANT
GOWN STRL REUS W/TWL XL LVL3 (GOWN DISPOSABLE) ×3 IMPLANT
GUIDEWIRE ANG ZIPWIRE 038X150 (WIRE) ×2 IMPLANT
MANIFOLD NEPTUNE II (INSTRUMENTS) ×3 IMPLANT
PACK CYSTO (CUSTOM PROCEDURE TRAY) ×3 IMPLANT
SYRINGE 12CC LL (MISCELLANEOUS) ×2 IMPLANT
TUBING CONNECTING 10 (TUBING) ×2 IMPLANT
TUBING CONNECTING 10' (TUBING) ×1
WIRE COONS/BENSON .038X145CM (WIRE) ×1 IMPLANT

## 2015-04-03 NOTE — Discharge Instructions (Signed)
Foley Catheter Care, Adult A Foley catheter is a soft, flexible tube. This tube is placed into your bladder to drain pee (urine). If you go home with this catheter in place, follow the instructions below. TAKING CARE OF THE CATHETER  Wash your hands with soap and water.  Put soap and water on a clean washcloth.  Clean the skin where the tube goes into your body.  Clean away from the tube site.  Never wipe toward the tube.  Clean the area using a circular motion.  Remove all the soap. Pat the area dry with a clean towel. For males, reposition the skin that covers the end of the penis (foreskin).  Attach the tube to your leg with tape or a leg strap. Do not stretch the tube tight. If you are using tape, remove any stickiness left behind by past tape you used.  Keep the drainage bag below your hips. Keep it off the floor.  Check your tube during the day. Make sure it is working and draining. Make sure the tube does not curl, twist, or bend.  Do not pull on the tube or try to take it out. TAKING CARE OF THE DRAINAGE BAGS You will have a large overnight drainage bag and a small leg bag. You may wear the overnight bag any time. Never wear the small bag at night. Follow the directions below. Emptying the Drainage Bag Empty your drainage bag when it is  - full or at least 2-3 times a day.  Wash your hands with soap and water.  Keep the drainage bag below your hips.  Hold the dirty bag over the toilet or clean container.  Open the pour spout at the bottom of the bag. Empty the pee into the toilet or container. Do not let the pour spout touch anything.  Clean the pour spout with a gauze pad or cotton ball that has rubbing alcohol on it.  Close the pour spout.  Attach the bag to your leg with tape or a leg strap.  Wash your hands well. Changing the Drainage Bag Change your bag once a month or sooner if it starts to smell or look dirty.   Wash your hands with soap and  water.  Pinch the rubber tube so that pee does not spill out.  Disconnect the catheter tube from the drainage tube at the connection valve. Do not let the tubes touch anything.  Clean the end of the catheter tube with an alcohol wipe. Clean the end of a the drainage tube with a different alcohol wipe.  Connect the catheter tube to the drainage tube of the clean drainage bag.  Attach the new bag to the leg with tape or a leg strap. Avoid attaching the new bag too tightly.  Wash your hands well. Cleaning the Drainage Bag  Wash your hands with soap and water.  Wash the bag in warm, soapy water.  Rinse the bag with warm water.  Fill the bag with a mixture of white vinegar and water (1 cup vinegar to 1 quart warm water [.2 liter vinegar to 1 liter warm water]). Close the bag and soak it for 30 minutes in the solution.  Rinse the bag with warm water.  Hang the bag to dry with the pour spout open and hanging downward.  Store the clean bag (once it is dry) in a clean plastic bag.  Wash your hands well. PREVENT INFECTION  Wash your hands before and after touching your tube.  Take showers every day. Wash the skin where the tube enters your body. Do not take baths. Replace wet leg straps with dry ones, if this applies.  Do not use powders, sprays, or lotions on the genital area. Only use creams, lotions, or ointments as told by your doctor.  For females, wipe from front to back after going to the bathroom.  Drink enough fluids to keep your pee clear or pale yellow unless you are told not to have too much fluid (fluid restriction).  Do not let the drainage bag or tubing touch or lie on the floor.  Wear cotton underwear to keep the area dry. GET HELP IF:  Your pee is cloudy or smells unusually bad.  Your tube becomes clogged.  You are not draining pee into the bag or your bladder feels full.  Your tube starts to leak. GET HELP RIGHT AWAY IF:  You have pain, puffiness  (swelling), redness, or yellowish-white fluid (pus) where the tube enters the body.  You have pain in the belly (abdomen), legs, lower back, or bladder.  You have a fever.  You see blood fill the tube, or your pee is pink or red.  You feel sick to your stomach (nauseous), throw up (vomit), or have chills.  Your tube gets pulled out. MAKE SURE YOU:   Understand these instructions.  Will watch your condition.  Will get help right away if you are not doing well or get worse.   This information is not intended to replace advice given to you by your health care provider. Make sure you discuss any questions you have with your health care provider.   Document Released: 04/26/2012 Document Revised: 01/20/2014 Document Reviewed: 04/26/2012 Elsevier Interactive Patient Education 2016 [place] Anesthesia, Adult, Care After Refer to this sheet in the next few weeks. These instructions provide you with information on caring for yourself after your procedure. Your health care provider may also give you more specific instructions. Your treatment has been planned according to current medical practices, but problems sometimes occur. Call your health care provider if you have any problems or questions after your procedure. WHAT TO EXPECT AFTER THE PROCEDURE After the procedure, it is typical to experience:  Sleepiness.  Nausea and vomiting. HOME CARE INSTRUCTIONS  For the first 24 hours after general anesthesia:  Have a responsible person with you.  Do not drive a car. If you are alone, do not take public transportation.  Do not drink alcohol.  Do not take medicine that has not been prescribed by your health care provider.  Do not sign important papers or make important decisions.  You may resume a normal diet and activities as directed by your health care provider.  If you have questions or problems that seem related to general anesthesia, call the hospital and ask for  the anesthetist or anesthesiologist on call. SEEK MEDICAL CARE IF:  You have nausea and vomiting that continue the day after anesthesia.  You develop a rash. SEEK IMMEDIATE MEDICAL CARE IF:   You have difficulty breathing.  You have chest pain.  You have any allergic problems.   This information is not intended to replace advice given to you by your health care provider. Make sure you discuss any questions you have with your health care provider.   Document Released: 04/07/2000 Document Revised: 01/20/2014 Document Reviewed: 04/30/2011 Elsevier Interactive Patient Education [employer].

## 2015-04-03 NOTE — Interval H&P Note (Signed)
History and Physical Interval Note:  04/03/2015 7:25 AM  [name]  has presented today for surgery, with the diagnosis of GROSS HEMATURIA, URETHRAL STRICTURE, HYDRONEPHROSIS  The various methods of treatment have been discussed with the patient and family. After consideration of risks, benefits and other options for treatment, the patient has consented to  Procedure(s): CYSTOSCOPY WITH URETHRAL DILATATION AND BALLOON DILATOR (N/A) RETROGRADE PYELOGRAM WITH BLADDER BIOPSY (N/A) as a surgical intervention .  The patient's history has been reviewed, patient examined, no change in status, stable for surgery.  I have reviewed the patient's chart and labs. Pt took two Levaquin yesterday and one day. He has been well. Urine clear. No fever. I discussed with the patient the nature, potential benefits, risks and alternatives to cysto, urethral stricture dilation, retrogrades, foley placement, including side effects of the proposed treatment, the likelihood of the patient achieving the goals of the procedure, and any potential problems that might occur during the procedure or recuperation. All questions answered. Patient elects to proceed.      [name]

## 2015-04-03 NOTE — Anesthesia Preprocedure Evaluation (Addendum)
Anesthesia Evaluation  Patient identified by MRN, date of birth, ID band Patient awake    Reviewed: Allergy & Precautions, NPO status , Patient's Chart, lab work & pertinent test results  Airway Mallampati: I  TM Distance: >3 FB Neck ROM: Full    Dental   Pulmonary former smoker,    Pulmonary exam normal        Cardiovascular hypertension, Pt. on medications + CAD and + Past MI  Normal cardiovascular exam  3/14 ECHO Study Conclusions  - Left ventricle: Akinesis of following segments: mid/apical inferior septal, mid/apical inferior, mid anterior septal. The cavity size was normal. Wall thickness was increased in a pattern of mild LVH. The estimated ejection fraction was 45%. - Mitral valve: Mild regurgitation. - Left atrium: The atrium was mildly to moderately dilated. Impressions:  - Overall LV function is a little better than 11/2009 study. There is no residual VSD.    Neuro/Psych    GI/Hepatic   Endo/Other    Renal/GU      Musculoskeletal   Abdominal   Peds  Hematology   Anesthesia Other Findings   Reproductive/Obstetrics                            Anesthesia Physical Anesthesia Plan  ASA: III  Anesthesia Plan: General   Post-op Pain Management:    Induction: Intravenous  Airway Management Planned: LMA  Additional Equipment:   Intra-op Plan:   Post-operative Plan: Extubation in OR  Informed Consent: I have reviewed the patients History and Physical, chart, labs and discussed the procedure including the risks, benefits and alternatives for the proposed anesthesia with the patient or authorized representative who has indicated his/her understanding and acceptance.     Plan Discussed with: CRNA and Surgeon  Anesthesia Plan Comments:         Anesthesia Quick Evaluation

## 2015-04-03 NOTE — Op Note (Signed)
Preoperative diagnosis: Chronic renal insufficiency, urethral stricture disease, urinary retention, bilateral hydronephrosis, bladder wall thickening Postoperative diagnosis: Chronic renal insufficiency, bladder wall thickening, prostate nodule  Procedure: Exam under anesthesia, cystoscopy, bilateral retrograde pyelogram, bladder and urethral biopsies with fulguration  Surgeon: Junious Silk  Anesthesia: Gen.  Indication for procedure: 70 year old who presented in urinary retention in January 2017 with a distended bladder, bilateral hydronephrosis and acute on chronic renal insufficiency. I was able to get a 7 French catheter in which felt like a dilated stricture. He was seen back in the office and felt a voiding trial. A 16 French catheter was replaced. He was brought to the operating room today for the above procedures.  Findings: On exam under anesthesia the penis was uncircumcised but there were no masses of the foreskin and no phimosis. The testicles were descended bilaterally and palpably normal. On digital rectal exam the prostate was small with subtle nodularity or induration of the right apical region. A B&O suppository was placed.  On cystoscopy the urethra had no stricture but there was a bridge of smooth tissue in the bulbar urethra possibly from prior stricture or dilation. There was an expanded area of the bulbar urethra which final into the prostate. Prostate was short and had some cystitis along the prostatic urethra with cystitis cystica changes. The bladder contained erythematous mucosa on the right and the left posteriorly in the dome contained cystitis cystica appearing mucosa. There were no specific papillary tumors. The trigone ureteral orifices were normal in their orthotopic position. There was clear efflux bilaterally.  Right retrograde pyelogram-this outlined a single ureter single collecting system unit without filling defect stricture dilation. There was brisk washout of  contrast from the ureter but slower washout of the collecting system.  Left retrograde pyelogram-this outlined a single ureter single collecting system unit. The client system on the side was smaller and there may be some left renal atrophy. There was no filling defect stricture dilation. There was brisk drainage.  Description of procedure: After consent was obtained patient brought to the operating room. After adequate anesthesia he is placed in lithotomy position and prepped and draped in the usual sterile fashion. A timeout was performed to confirm the patient and procedure. An exam under anesthesia was performed. I placed a B&O suppository. The cystoscope was passed per urethra and the bladder carefully inspected with a 30 and 70 lens. Then used a flexible biopsy forceps to biopsy the right bladder, posterior, left bladder and fulgurated these areas. Hemostasis was excellent. There was some cystitis changes in the prostatic urethra and this was biopsied and lightly fulgurated. In the prostatic urethra there were some cystitis cystica type changes in a biopsied this tissue and was able to pull off some of the mucosa. It was lightly fulgurated. The expanded area in the bulbar urethra had some changes on the mucosal surface were darker pigmentation. I tried the biopsy this area 2. It did not require fulguration. There was a simple bridge of tissue more distal in the bulb with smooth and normal appearing mucosa. I was able to remove this by grasping one side and then the other and removing it. I did not send it for pathology. It appeared benign. The connections on the right and left urethral wall were lightly fulgurated. The bladder was inspected again and hemostasis noted to be excellent from the biopsy sites. The bladder was filled and the scope removed and an 10 [place] coud catheter was placed without difficulty. The patient was awakened taken to  recovery room in stable condition.  Complications:  None  Blood loss: 2 mL's  Drains: 18 French Foley catheter  Specimens to pathology: #1 right bladder biopsy #2 posterior bladder biopsy #3 left bladder biopsy #4 prostatic urethral biopsy #5 bulb urethral biopsy

## 2015-04-03 NOTE — Anesthesia Postprocedure Evaluation (Signed)
Anesthesia Post Note  Patient: CAS MERGEL  Procedure(s) Performed: Procedure(s) (LRB): CYSTOSCOPY WITH BILATERAL RETROGRADES (N/A)  BLADDER BIOPSIES (N/A)  Patient location during evaluation: PACU Anesthesia Type: General Level of consciousness: awake and alert Pain management: pain level controlled Vital Signs Assessment: post-procedure vital signs reviewed and stable Respiratory status: spontaneous breathing, nonlabored ventilation, respiratory function stable and patient connected to nasal cannula oxygen Cardiovascular status: blood pressure returned to baseline and stable Postop Assessment: no signs of nausea or vomiting Anesthetic complications: no    Last Vitals:  Filed Vitals:   04/03/15 0945 04/03/15 1057  BP: 128/83 125/73  Pulse: 65 61  Temp: 36.4 C   Resp: 14 16    Last Pain:  Filed Vitals:   04/03/15 1058  PainSc: 0-No pain                 Sanaiya Welliver DAVID

## 2015-04-03 NOTE — Anesthesia Procedure Notes (Addendum)
Procedure Name: LMA Insertion Date/Time: 04/03/2015 8:21 AM Performed by: Freddie Breech Pre-anesthesia Checklist: Patient identified, Emergency Drugs available, Suction available, Patient being monitored and Timeout performed Patient Re-evaluated:Patient Re-evaluated prior to inductionOxygen Delivery Method: Circle system utilized Preoxygenation: Pre-oxygenation with 100% oxygen Intubation Type: IV induction Ventilation: Mask ventilation without difficulty LMA: LMA inserted LMA Size: 4.0 Number of attempts: 1 Airway Equipment and Method: Patient positioned with wedge pillow Placement Confirmation: positive ETCO2,  CO2 detector and breath sounds checked- equal and bilateral Tube secured with: Tape Dental Injury: Teeth and Oropharynx as per pre-operative assessment

## 2015-04-03 NOTE — Progress Notes (Signed)
Call to son for DC ride. Message left on voicemail for return call and ready to dc home

## 2015-04-03 NOTE — Transfer of Care (Signed)
Immediate Anesthesia Transfer of Care Note  Patient: [name]  Procedure(s) Performed: Procedure(s): CYSTOSCOPY WITH BILATERAL RETROGRADES (N/A)  BLADDER BIOPSIES (N/A)  Patient Location: PACU  Anesthesia Type:General  Level of Consciousness:  sedated, patient cooperative and responds to stimulation  Airway & Oxygen Therapy:Patient Spontanous Breathing and Patient connected to face mask oxgen  Post-op Assessment:  Report given to PACU RN and Post -op Vital signs reviewed and stable  Post vital signs:  Reviewed and stable  Last Vitals:  Filed Vitals:   04/03/15 0531 04/03/15 0910  BP: 129/84 131/90  Pulse: 72 72  Temp: 36.4 C 36.2 C  Resp: 16 17    Complications: No apparent anesthesia complications

## 2015-04-16 DIAGNOSIS — N358 Other urethral stricture: Secondary | ICD-10-CM | POA: Diagnosis not present

## 2015-11-14 DIAGNOSIS — N183 Chronic kidney disease, stage 3 unspecified: Secondary | ICD-10-CM

## 2015-11-14 HISTORY — DX: Chronic kidney disease, stage 3 unspecified: N18.30

## 2015-11-17 ENCOUNTER — Emergency Department (HOSPITAL_COMMUNITY): Payer: Medicare Other

## 2015-11-17 ENCOUNTER — Encounter (HOSPITAL_COMMUNITY): Payer: Self-pay

## 2015-11-17 ENCOUNTER — Inpatient Hospital Stay (HOSPITAL_COMMUNITY)
Admission: EM | Admit: 2015-11-17 | Discharge: 2015-11-22 | DRG: 699 | Disposition: A | Discharge status: Home Health | Payer: Medicare Other | Attending: Family Medicine | Admitting: Family Medicine

## 2015-11-17 DIAGNOSIS — N390 Urinary tract infection, site not specified: Secondary | ICD-10-CM | POA: Diagnosis present

## 2015-11-17 DIAGNOSIS — R944 Abnormal results of kidney function studies: Secondary | ICD-10-CM | POA: Diagnosis not present

## 2015-11-17 DIAGNOSIS — N3001 Acute cystitis with hematuria: Secondary | ICD-10-CM

## 2015-11-17 DIAGNOSIS — N320 Bladder-neck obstruction: Principal | ICD-10-CM | POA: Diagnosis present

## 2015-11-17 DIAGNOSIS — D638 Anemia in other chronic diseases classified elsewhere: Secondary | ICD-10-CM | POA: Diagnosis present

## 2015-11-17 DIAGNOSIS — E860 Dehydration: Secondary | ICD-10-CM | POA: Diagnosis present

## 2015-11-17 DIAGNOSIS — E875 Hyperkalemia: Secondary | ICD-10-CM | POA: Insufficient documentation

## 2015-11-17 DIAGNOSIS — R1030 Lower abdominal pain, unspecified: Secondary | ICD-10-CM | POA: Diagnosis not present

## 2015-11-17 DIAGNOSIS — N401 Enlarged prostate with lower urinary tract symptoms: Secondary | ICD-10-CM | POA: Diagnosis present

## 2015-11-17 DIAGNOSIS — D509 Iron deficiency anemia, unspecified: Secondary | ICD-10-CM | POA: Diagnosis present

## 2015-11-17 DIAGNOSIS — N183 Chronic kidney disease, stage 3 unspecified: Secondary | ICD-10-CM | POA: Diagnosis present

## 2015-11-17 DIAGNOSIS — Z951 Presence of aortocoronary bypass graft: Secondary | ICD-10-CM | POA: Diagnosis not present

## 2015-11-17 DIAGNOSIS — D649 Anemia, unspecified: Secondary | ICD-10-CM | POA: Diagnosis present

## 2015-11-17 DIAGNOSIS — T801XXA Vascular complications following infusion, transfusion and therapeutic injection, initial encounter: Secondary | ICD-10-CM

## 2015-11-17 DIAGNOSIS — I2510 Atherosclerotic heart disease of native coronary artery without angina pectoris: Secondary | ICD-10-CM | POA: Diagnosis present

## 2015-11-17 DIAGNOSIS — I5022 Chronic systolic (congestive) heart failure: Secondary | ICD-10-CM | POA: Diagnosis present

## 2015-11-17 DIAGNOSIS — N358 Other urethral stricture: Secondary | ICD-10-CM | POA: Diagnosis present

## 2015-11-17 DIAGNOSIS — R109 Unspecified abdominal pain: Secondary | ICD-10-CM | POA: Diagnosis not present

## 2015-11-17 DIAGNOSIS — E785 Hyperlipidemia, unspecified: Secondary | ICD-10-CM | POA: Diagnosis present

## 2015-11-17 DIAGNOSIS — R319 Hematuria, unspecified: Secondary | ICD-10-CM

## 2015-11-17 DIAGNOSIS — I25810 Atherosclerosis of coronary artery bypass graft(s) without angina pectoris: Secondary | ICD-10-CM | POA: Diagnosis present

## 2015-11-17 DIAGNOSIS — Z79899 Other long term (current) drug therapy: Secondary | ICD-10-CM

## 2015-11-17 DIAGNOSIS — I10 Essential (primary) hypertension: Secondary | ICD-10-CM | POA: Diagnosis not present

## 2015-11-17 DIAGNOSIS — I130 Hypertensive heart and chronic kidney disease with heart failure and stage 1 through stage 4 chronic kidney disease, or unspecified chronic kidney disease: Secondary | ICD-10-CM | POA: Diagnosis present

## 2015-11-17 DIAGNOSIS — N1330 Unspecified hydronephrosis: Secondary | ICD-10-CM | POA: Diagnosis not present

## 2015-11-17 DIAGNOSIS — R1084 Generalized abdominal pain: Secondary | ICD-10-CM | POA: Diagnosis not present

## 2015-11-17 DIAGNOSIS — Z7982 Long term (current) use of aspirin: Secondary | ICD-10-CM

## 2015-11-17 DIAGNOSIS — I252 Old myocardial infarction: Secondary | ICD-10-CM

## 2015-11-17 DIAGNOSIS — R338 Other retention of urine: Secondary | ICD-10-CM | POA: Diagnosis not present

## 2015-11-17 DIAGNOSIS — N179 Acute kidney failure, unspecified: Secondary | ICD-10-CM | POA: Diagnosis present

## 2015-11-17 DIAGNOSIS — R339 Retention of urine, unspecified: Secondary | ICD-10-CM | POA: Diagnosis present

## 2015-11-17 DIAGNOSIS — N35919 Unspecified urethral stricture, male, unspecified site: Secondary | ICD-10-CM | POA: Diagnosis present

## 2015-11-17 DIAGNOSIS — N1339 Other hydronephrosis: Secondary | ICD-10-CM | POA: Diagnosis not present

## 2015-11-17 DIAGNOSIS — Z87891 Personal history of nicotine dependence: Secondary | ICD-10-CM

## 2015-11-17 LAB — BASIC METABOLIC PANEL
Anion gap: 7 (ref 5–15)
BUN: 95 mg/dL — AB (ref 6–20)
CALCIUM: 9.4 mg/dL (ref 8.9–10.3)
CO2: 14 mmol/L — ABNORMAL LOW (ref 22–32)
Chloride: 112 mmol/L — ABNORMAL HIGH (ref 101–111)
Creatinine, Ser: 8.99 mg/dL — ABNORMAL HIGH (ref 0.61–1.24)
GFR calc Af Amer: 6 mL/min — ABNORMAL LOW (ref >60)
GFR, EST NON AFRICAN AMERICAN: 5 mL/min — AB (ref >60)
GLUCOSE: 116 mg/dL — AB (ref 65–99)
POTASSIUM: 5.7 mmol/L — AB (ref 3.5–5.1)
Sodium: 133 mmol/L — ABNORMAL LOW (ref 135–145)

## 2015-11-17 LAB — COMPREHENSIVE METABOLIC PANEL
ALBUMIN: 3.5 g/dL (ref 3.5–5.0)
ALK PHOS: 64 U/L (ref 38–126)
ALT: 8 U/L — ABNORMAL LOW (ref 17–63)
ANION GAP: 8 (ref 5–15)
AST: 9 U/L — AB (ref 15–41)
BILIRUBIN TOTAL: 0.5 mg/dL (ref 0.3–1.2)
BUN: 100 mg/dL — AB (ref 6–20)
CALCIUM: 9.7 mg/dL (ref 8.9–10.3)
CO2: 12 mmol/L — ABNORMAL LOW (ref 22–32)
Chloride: 111 mmol/L (ref 101–111)
Creatinine, Ser: 9.41 mg/dL — ABNORMAL HIGH (ref 0.61–1.24)
GFR calc Af Amer: 6 mL/min — ABNORMAL LOW (ref >60)
GFR, EST NON AFRICAN AMERICAN: 5 mL/min — AB (ref >60)
GLUCOSE: 116 mg/dL — AB (ref 65–99)
POTASSIUM: 5.8 mmol/L — AB (ref 3.5–5.1)
Sodium: 131 mmol/L — ABNORMAL LOW (ref 135–145)
TOTAL PROTEIN: 8.9 g/dL — AB (ref 6.5–8.1)

## 2015-11-17 LAB — URINALYSIS, ROUTINE W REFLEX MICROSCOPIC
BILIRUBIN URINE: NEGATIVE
Glucose, UA: NEGATIVE mg/dL
KETONES UR: NEGATIVE mg/dL
NITRITE: NEGATIVE
PH: 7.5 (ref 5.0–8.0)
PROTEIN: 100 mg/dL — AB
Specific Gravity, Urine: 1.01 (ref 1.005–1.030)

## 2015-11-17 LAB — CBC
HEMATOCRIT: 23.7 % — AB (ref 39.0–52.0)
HEMATOCRIT: 24.2 % — AB (ref 39.0–52.0)
Hemoglobin: 7.7 g/dL — ABNORMAL LOW (ref 13.0–17.0)
Hemoglobin: 7.8 g/dL — ABNORMAL LOW (ref 13.0–17.0)
MCH: 25.1 pg — ABNORMAL LOW (ref 26.0–34.0)
MCH: 25.2 pg — ABNORMAL LOW (ref 26.0–34.0)
MCHC: 32.2 g/dL (ref 30.0–36.0)
MCHC: 32.5 g/dL (ref 30.0–36.0)
MCV: 77.7 fL — ABNORMAL LOW (ref 78.0–100.0)
MCV: 77.8 fL — ABNORMAL LOW (ref 78.0–100.0)
Platelets: 406 10*3/uL — ABNORMAL HIGH (ref 150–400)
Platelets: 412 10*3/uL — ABNORMAL HIGH (ref 150–400)
RBC: 3.05 MIL/uL — ABNORMAL LOW (ref 4.22–5.81)
RBC: 3.11 MIL/uL — ABNORMAL LOW (ref 4.22–5.81)
RDW: 17 % — AB (ref 11.5–15.5)
RDW: 17.2 % — AB (ref 11.5–15.5)
WBC: 6.6 10*3/uL (ref 4.0–10.5)
WBC: 6.8 10*3/uL (ref 4.0–10.5)

## 2015-11-17 LAB — BRAIN NATRIURETIC PEPTIDE: B Natriuretic Peptide: 100.7 pg/mL — ABNORMAL HIGH (ref 0.0–100.0)

## 2015-11-17 LAB — GLUCOSE, CAPILLARY: Glucose-Capillary: 109 mg/dL — ABNORMAL HIGH (ref 65–99)

## 2015-11-17 LAB — PROTIME-INR
INR: 1.22
PROTHROMBIN TIME: 15.5 s — AB (ref 11.4–15.2)

## 2015-11-17 LAB — URINE MICROSCOPIC-ADD ON

## 2015-11-17 LAB — LIPASE, BLOOD: Lipase: 80 U/L — ABNORMAL HIGH (ref 11–51)

## 2015-11-17 LAB — I-STAT TROPONIN, ED: TROPONIN I, POC: 0 ng/mL (ref 0.00–0.08)

## 2015-11-17 LAB — SODIUM, URINE, RANDOM: Sodium, Ur: 44 mmol/L

## 2015-11-17 LAB — CREATININE, URINE, RANDOM: Creatinine, Urine: 64.42 mg/dL

## 2015-11-17 MED ORDER — HYDRALAZINE HCL 20 MG/ML IJ SOLN: 5.0000 mg | INTRAMUSCULAR | Status: DC | PRN

## 2015-11-17 MED ORDER — CARVEDILOL 12.5 MG PO TABS
12.5000 mg | ORAL_TABLET | Freq: Two times a day (BID) | ORAL | Status: DC
Start: 2015-11-17 — End: 2015-11-22
  Administered 2015-11-17 – 2015-11-22 (×11): 12.5 mg via ORAL
  Filled 2015-11-17 (×11): qty 1

## 2015-11-17 MED ORDER — SODIUM CHLORIDE 0.9% FLUSH
3.0000 mL | Freq: Two times a day (BID) | INTRAVENOUS | Status: DC
  Administered 2015-11-17 – 2015-11-20 (×4): 3 mL via INTRAVENOUS

## 2015-11-17 MED ORDER — SODIUM CHLORIDE 0.9 % IV BOLUS (SEPSIS)
1000.0000 mL | Freq: Once | INTRAVENOUS | Status: AC
  Administered 2015-11-17: 1000 mL via INTRAVENOUS

## 2015-11-17 MED ORDER — ASPIRIN EC 81 MG PO TBEC
81.0000 mg | DELAYED_RELEASE_TABLET | Freq: Every day | ORAL | Status: DC
  Administered 2015-11-17 – 2015-11-22 (×6): 81 mg via ORAL
  Filled 2015-11-17 (×6): qty 1

## 2015-11-17 MED ORDER — DEXTROSE 5 % IV SOLN
1.0000 g | Freq: Once | INTRAVENOUS | Status: AC
  Administered 2015-11-17: 1 g via INTRAVENOUS
  Filled 2015-11-17: qty 10

## 2015-11-17 MED ORDER — TAMSULOSIN HCL 0.4 MG PO CAPS
0.4000 mg | ORAL_CAPSULE | Freq: Every day | ORAL | Status: DC
  Administered 2015-11-17: 0.4 mg via ORAL
  Filled 2015-11-17: qty 1

## 2015-11-17 MED ORDER — LIDOCAINE HCL 2 % EX GEL
1.0000 "application " | Freq: Once | CUTANEOUS | Status: DC | PRN
  Filled 2015-11-17: qty 11

## 2015-11-17 MED ORDER — PNEUMOCOCCAL VAC POLYVALENT 25 MCG/0.5ML IJ INJ
0.5000 mL | INJECTION | INTRAMUSCULAR | Status: DC
  Filled 2015-11-17 (×2): qty 0.5

## 2015-11-17 MED ORDER — ONDANSETRON HCL 4 MG PO TABS: 4.0000 mg | ORAL_TABLET | Freq: Four times a day (QID) | ORAL | Status: DC | PRN

## 2015-11-17 MED ORDER — SODIUM CHLORIDE 0.9 % IV BOLUS (SEPSIS)
1000.0000 mL | Freq: Once | INTRAVENOUS | Status: AC
Start: 2015-11-17 — End: 2015-11-17
  Administered 2015-11-17: 1000 mL via INTRAVENOUS

## 2015-11-17 MED ORDER — ZOLPIDEM TARTRATE 5 MG PO TABS: 5.0000 mg | ORAL_TABLET | Freq: Every evening | ORAL | Status: DC | PRN

## 2015-11-17 MED ORDER — DEXTROSE 5 % IV SOLN
1.0000 g | INTRAVENOUS | Status: DC
  Administered 2015-11-18: 1 g via INTRAVENOUS
  Filled 2015-11-17: qty 10

## 2015-11-17 MED ORDER — ACETAMINOPHEN 325 MG PO TABS: 650.0000 mg | ORAL_TABLET | Freq: Four times a day (QID) | ORAL | Status: DC | PRN

## 2015-11-17 MED ORDER — AMLODIPINE BESYLATE 10 MG PO TABS
10.0000 mg | ORAL_TABLET | Freq: Every day | ORAL | Status: DC
  Administered 2015-11-17 – 2015-11-18 (×2): 10 mg via ORAL
  Filled 2015-11-17 (×2): qty 1

## 2015-11-17 MED ORDER — ROSUVASTATIN CALCIUM 20 MG PO TABS
40.0000 mg | ORAL_TABLET | Freq: Every day | ORAL | Status: DC
  Administered 2015-11-17 – 2015-11-22 (×6): 40 mg via ORAL
  Filled 2015-11-17 (×6): qty 2

## 2015-11-17 MED ORDER — ALBUTEROL SULFATE (2.5 MG/3ML) 0.083% IN NEBU
2.5000 mg | INHALATION_SOLUTION | Freq: Four times a day (QID) | RESPIRATORY_TRACT | Status: DC | PRN
Start: 2015-11-17 — End: 2015-11-22

## 2015-11-17 MED ORDER — OXYCODONE-ACETAMINOPHEN 5-325 MG PO TABS: 1.0000 | ORAL_TABLET | ORAL | Status: DC | PRN

## 2015-11-17 MED ORDER — SODIUM POLYSTYRENE SULFONATE 15 GM/60ML PO SUSP
30.0000 g | Freq: Once | ORAL | Status: AC
  Administered 2015-11-17: 30 g via ORAL
  Filled 2015-11-17: qty 120

## 2015-11-17 MED ORDER — ONDANSETRON HCL 4 MG/2ML IJ SOLN: 4.0000 mg | Freq: Four times a day (QID) | INTRAMUSCULAR | Status: DC | PRN

## 2015-11-17 MED ORDER — SODIUM CHLORIDE 0.9 % IV SOLN
INTRAVENOUS | Status: DC
  Administered 2015-11-17 – 2015-11-21 (×9): via INTRAVENOUS

## 2015-11-17 MED ORDER — INFLUENZA VAC SPLIT QUAD 0.5 ML IM SUSY
0.5000 mL | PREFILLED_SYRINGE | INTRAMUSCULAR | Status: DC
  Filled 2015-11-17: qty 0.5

## 2015-11-17 MED ORDER — ACETAMINOPHEN 650 MG RE SUPP: 650.0000 mg | Freq: Four times a day (QID) | RECTAL | Status: DC | PRN

## 2015-11-17 NOTE — ED Provider Notes (Signed)
[facility] DEPT Provider Note   CSN: [id number] Arrival date & time: 11/17/15  0015  By signing my name below, I, Royce Macadamia, attest that this documentation has been prepared under the direction and in the presence of Treyden Hakim, MD . Electronically Signed: Royce Macadamia, [place]. 11/17/2015. 1:21 AM.  History   Chief Complaint Chief Complaint  Patient presents with  . Abdominal Pain   The history is provided by the patient and medical records. No language interpreter was used.  Abdominal Pain   This is a new problem. The current episode started more than 2 days ago. The problem occurs constantly. The problem has not changed since onset.The pain is associated with an unknown factor. The pain is located in the generalized abdominal region. The pain is moderate. Associated symptoms include anorexia, nausea and vomiting. Pertinent negatives include fever, belching, diarrhea, flatus, dysuria and hematuria. Nothing aggravates the symptoms. Nothing relieves the symptoms. Past workup does not include surgery. His past medical history does not include GERD or Crohn's disease.    HPI Comments:  [name] is a 70 y.o. male wit history of hyperkalemia who presents to the Emergency Department complaining of abdominal pain for the last 2 days.  Pt notes associated vomiting; he last threw up last night and his episode lasted 10 minutes, dizziness and fatigue.  Pt has only drank a sip of water since vomiting; he states he throws up every time he drinks.  He has not felt hungry for 3 days and hasn't eaten in 3 days.  Pt passes gass any time he urinates or has a bowel movent.  Pt notes frequent belching and states it tastes abnormal.  He denies diarrhea, constipation, fever, cough, sore throat, muscle aches, chest pain, SOB, new medications, dysuria, hematuria, lower back pain.    Past Medical History:  Diagnosis Date  . Acute MI, inferior wall ([place])   . Amputation finger    right  first finger top portion age 63  . CHF (congestive heart failure) ([place])   . Coronary artery disease    severe 3 vessel   . Fall   . Foley catheter in place   . H/O hyperkalemia   . H/O thrombocytosis   . Hip fracture, left ([place])    11/2014  . History of blood transfusion   . History of metabolic acidosis   . History of tobacco abuse   . Hypertension   . Obstructive uropathy   . Pneumonia    childhood   . S/P VSD repair    09/2009  . Trichomonas infection   . Urethral stricture due to infective diseases classified elsewhere   . Urinary tract infection     Patient Active Problem List   Diagnosis Date Noted  . Diabetes mellitus screening 02/14/2015  . Uropathy, obstructive 01/30/2015  . UTI (lower urinary tract infection) 01/30/2015  . Trichomonas infection 01/30/2015  . Hydronephrosis determined by ultrasound   . Metabolic acidemia   . Protein-calorie malnutrition ([place])   . Microcytic anemia   . Acute renal failure superimposed on stage 3 chronic kidney disease ([place]) 01/25/2015  . Acute left flank pain 01/25/2015  . Hyperkalemia 01/25/2015  . Thrombocytosis ([place]) 01/25/2015  . Metabolic acidosis, increased anion gap (IAG) 01/25/2015  . Hyperlipidemia 02/19/2012  . Cardiomyopathy, ischemic 02/19/2012  . Coronary atherosclerosis of native coronary artery 07/01/2010  . Other and unspecified hyperlipidemia 07/01/2010  . SYSTOLIC HEART FAILURE, CHRONIC 12/11/2009  . HYPERTENSION, BENIGN 11/07/2009  . CAD, AUTOLOGOUS BYPASS  GRAFT 11/07/2009  . Ventricular septal defect 11/07/2009  . MURMUR 11/07/2009    Past Surgical History:  Procedure Laterality Date  . CORONARY ARTERY BYPASS GRAFT     times 4  . CYSTOSCOPY W/ RETROGRADES N/A 04/03/2015   Procedure:  BLADDER BIOPSIES;  Surgeon: Festus Aloe, MD;  Location: [facility] ORS;  Service: Urology;  Laterality: N/A;  . CYSTOSCOPY WITH URETHRAL DILATATION N/A 04/03/2015   Procedure: CYSTOSCOPY WITH BILATERAL RETROGRADES;  Surgeon:  Festus Aloe, MD;  Location: [facility] ORS;  Service: Urology;  Laterality: N/A;  . flexible cystoscopy    . repair of post infarction posterior ventricular septal defect         Home Medications    Prior to Admission medications   Medication Sig Start Date End Date Taking? Authorizing Provider  amLODipine (NORVASC) 10 MG tablet Take 0.5 tablets (5 mg total) by mouth daily. Patient taking differently: Take 10 mg by mouth daily.  01/30/15   Nishant Dhungel, MD  aspirin EC 81 MG tablet Take 1 tablet (81 mg total) by mouth daily. 11/06/14   Lelon Perla, MD  carvedilol (COREG) 12.5 MG tablet take 1 tablet by mouth twice a day with meals 11/30/14   Lelon Perla, MD  levofloxacin (LEVAQUIN) 250 MG tablet Take 1 tablet (250 mg total) by mouth daily. 04/03/15   Festus Aloe, MD  lisinopril (PRINIVIL,ZESTRIL) 10 MG tablet Take 10 mg by mouth daily.    Historical Provider, MD  rosuvastatin (CRESTOR) 40 MG tablet take 1 tablet by mouth once daily 11/30/14   Lelon Perla, MD  tamsulosin (FLOMAX) 0.4 MG CAPS capsule Take 1 capsule (0.4 mg total) by mouth daily after supper. 04/03/15   Festus Aloe, MD    Family History No family history on file.  Social History Social History  Substance Use Topics  . Smoking status: Former Smoker    Packs/day: 1.50    Years: 45.00    Types: Cigarettes    Quit date: 01/13/2010  . Smokeless tobacco: Never Used  . Alcohol use No     Allergies   Chlorhexidine gluconate   Review of Systems Review of Systems  Constitutional: Positive for appetite change and fatigue. Negative for chills and fever.  HENT: Negative for rhinorrhea and sore throat.   Respiratory: Negative for shortness of breath.   Cardiovascular: Negative for chest pain.  Gastrointestinal: Positive for abdominal pain, anorexia, nausea and vomiting. Negative for diarrhea and flatus.  Genitourinary: Negative for dysuria and hematuria.  Musculoskeletal: Negative for back pain.    All other systems reviewed and are negative.    Physical Exam Updated Vital Signs BP 105/66 (BP Location: Left Arm)   Pulse 73   Temp 97.9 F (36.6 C) (Oral)   Resp 20   Ht 5\' 6"  (1.676 m)   Wt 174 lb 4 oz (79 kg)   SpO2 100%   BMI 28.12 kg/m   Physical Exam  Constitutional: He is oriented to person, place, and time. He appears well-developed and well-nourished. No distress.  HENT:  Head: Normocephalic and atraumatic.  Mouth/Throat: Oropharynx is clear and moist. No oropharyngeal exudate.  Moist mucous membranes.  No exudate.    Eyes: Conjunctivae are normal. Pupils are equal, round, and reactive to light.  Neck: Normal range of motion. Neck supple. No JVD present. No tracheal deviation present.  Trachea midline No bruit  Cardiovascular: Normal rate, regular rhythm, normal heart sounds and intact distal pulses.   Pulmonary/Chest: Effort normal and breath sounds  normal. No stridor. No respiratory distress. He has no wheezes. He has no rales.  Abdominal: Soft. Bowel sounds are normal. He exhibits distension. He exhibits no mass. There is no tenderness. There is no rebound and no guarding.  Gassy throughout.  Musculoskeletal: Normal range of motion.  DTR intact.    Neurological: He is alert and oriented to person, place, and time. He has normal reflexes.  Skin: Skin is warm and dry. Capillary refill takes less than 2 seconds.  Big lymphoma left flank.    Psychiatric: He has a normal mood and affect. His behavior is normal.  Nursing note and vitals reviewed.    ED Treatments / Results   Vitals:   11/17/15 0017 11/17/15 0230  BP: 105/66 132/84  Pulse: 73 68  Resp: 20 17  Temp: 97.9 F (36.6 C)    Results for orders placed or performed during the hospital encounter of 11/17/15  Lipase, blood  Result Value Ref Range   Lipase 80 (H) 11 - 51 U/L  Comprehensive metabolic panel  Result Value Ref Range   Sodium 131 (L) 135 - 145 mmol/L   Potassium 5.8 (H) 3.5 - 5.1  mmol/L   Chloride 111 101 - 111 mmol/L   CO2 12 (L) 22 - 32 mmol/L   Glucose, Bld 116 (H) 65 - 99 mg/dL   BUN 100 (H) 6 - 20 mg/dL   Creatinine, Ser 9.41 (H) 0.61 - 1.24 mg/dL   Calcium 9.7 8.9 - 10.3 mg/dL   Total Protein 8.9 (H) 6.5 - 8.1 g/dL   Albumin 3.5 3.5 - 5.0 g/dL   AST 9 (L) 15 - 41 U/L   ALT 8 (L) 17 - 63 U/L   Alkaline Phosphatase 64 38 - 126 U/L   Total Bilirubin 0.5 0.3 - 1.2 mg/dL   GFR calc non Af Amer 5 (L) >60 mL/min   GFR calc Af Amer 6 (L) >60 mL/min   Anion gap 8 5 - 15  CBC  Result Value Ref Range   WBC 6.6 4.0 - 10.5 K/uL   RBC 3.11 (L) 4.22 - 5.81 MIL/uL   Hemoglobin 7.8 (L) 13.0 - 17.0 g/dL   HCT 24.2 (L) 39.0 - 52.0 %   MCV 77.8 (L) 78.0 - 100.0 fL   MCH 25.1 (L) 26.0 - 34.0 pg   MCHC 32.2 30.0 - 36.0 g/dL   RDW 17.0 (H) 11.5 - 15.5 %   Platelets 406 (H) 150 - 400 K/uL  Urinalysis, Routine w reflex microscopic  Result Value Ref Range   Color, Urine YELLOW YELLOW   APPearance TURBID (A) CLEAR   Specific Gravity, Urine 1.010 1.005 - 1.030   pH 7.5 5.0 - 8.0   Glucose, UA NEGATIVE NEGATIVE mg/dL   Hgb urine dipstick LARGE (A) NEGATIVE   Bilirubin Urine NEGATIVE NEGATIVE   Ketones, ur NEGATIVE NEGATIVE mg/dL   Protein, ur 100 (A) NEGATIVE mg/dL   Nitrite NEGATIVE NEGATIVE   Leukocytes, UA LARGE (A) NEGATIVE  Urine microscopic-add on  Result Value Ref Range   Squamous Epithelial / LPF 0-5 (A) NONE SEEN   WBC, UA TOO NUMEROUS TO COUNT 0 - 5 WBC/hpf   RBC / HPF TOO NUMEROUS TO COUNT 0 - 5 RBC/hpf   Bacteria, UA MANY (A) NONE SEEN   Ct Renal Stone Study  Result Date: 11/17/2015 CLINICAL DATA:  Acute onset of lower abdominal pain, nausea and vomiting. Initial encounter. EXAM: CT ABDOMEN AND PELVIS WITHOUT CONTRAST TECHNIQUE: Multidetector CT imaging  of the abdomen and pelvis was performed following the standard protocol without IV contrast. COMPARISON:  CT of the abdomen and pelvis performed 01/26/2015 FINDINGS: Lower chest: Minimal left basilar  atelectasis is noted. The patient is status post median sternotomy. The patient is status post remote left ventricular myocardial infarction, with associated wall calcification. Hepatobiliary: The liver is unremarkable in appearance. The gallbladder is unremarkable in appearance. The common bile duct remains normal in caliber. Pancreas: The pancreas is within normal limits. Spleen: The spleen is unremarkable in appearance. Adrenals/Urinary Tract: The adrenal glands are unremarkable in appearance. Relatively severe chronic bilateral hydronephrosis is noted, with underlying mild renal atrophy. Hydronephrosis is somewhat worse on the right. Bilateral perinephric stranding is seen. A 1.2 cm hyperdense cyst is noted at the interpole region of the left kidney. No renal or ureteral stones are identified. Stomach/Bowel: The stomach is unremarkable in appearance. The small bowel is within normal limits. The appendix is normal in caliber, without evidence of appendicitis. The colon is unremarkable in appearance. Vascular/Lymphatic: Scattered calcification is seen along the abdominal aorta and its branches. The abdominal aorta is otherwise grossly unremarkable. The inferior vena cava is grossly unremarkable. No retroperitoneal lymphadenopathy is seen. No pelvic sidewall lymphadenopathy is identified. Reproductive: The prostate is enlarged, measuring 5.3 cm in AP dimension. Diffuse bladder wall thickening likely reflects underlying chronic inflammation. Severe bilateral hydronephrosis may reflect chronic distal obstruction due to previously described urethral stricture. Other: No additional soft tissue abnormalities are seen. Musculoskeletal: No acute osseous abnormalities are identified. The visualized musculature is unremarkable in appearance. IMPRESSION: 1. No acute abnormality seen to explain the patient's symptoms. 2. Relatively severe chronic bilateral hydronephrosis noted, significantly worsened from the prior study,  with underlying mild renal atrophy. Hydronephrosis is somewhat worse on the right. This may reflect chronic distal obstruction due to previously described urethral stricture. Diffuse bladder wall thickening again noted, with underlying enlarged prostate. 3. Status post remote left ventricular myocardial infarction, with associated mild wall calcification. 4. 1.2 cm hyperdense left renal cyst noted. 5. Scattered aortic atherosclerosis. Electronically Signed   By: Garald Balding M.D.   On: 11/17/2015 02:57    DIAGNOSTIC STUDIES: Oxygen Saturation is 100% on RA, NML by my interpretation.    COORDINATION OF CARE: 1:19 AM Discussed treatment plan with pt at bedside and pt agreed to plan.  Labs (all labs ordered are listed, but only abnormal results are displayed) Labs Reviewed  CBC - Abnormal; Notable for the following:       Result Value   RBC 3.11 (*)    Hemoglobin 7.8 (*)    HCT 24.2 (*)    MCV 77.8 (*)    MCH 25.1 (*)    RDW 17.0 (*)    Platelets 406 (*)    All other components within normal limits  URINALYSIS, ROUTINE W REFLEX MICROSCOPIC (NOT AT [facility]) - Abnormal; Notable for the following:    APPearance TURBID (*)    Hgb urine dipstick LARGE (*)    Protein, ur 100 (*)    Leukocytes, UA LARGE (*)    All other components within normal limits  URINE MICROSCOPIC-ADD ON - Abnormal; Notable for the following:    Squamous Epithelial / LPF 0-5 (*)    Bacteria, UA MANY (*)    All other components within normal limits  LIPASE, BLOOD  COMPREHENSIVE METABOLIC PANEL   Results for orders placed or performed during the hospital encounter of 11/17/15  Lipase, blood  Result Value Ref Range  Lipase 80 (H) 11 - 51 U/L  Comprehensive metabolic panel  Result Value Ref Range   Sodium 131 (L) 135 - 145 mmol/L   Potassium 5.8 (H) 3.5 - 5.1 mmol/L   Chloride 111 101 - 111 mmol/L   CO2 12 (L) 22 - 32 mmol/L   Glucose, Bld 116 (H) 65 - 99 mg/dL   BUN 100 (H) 6 - 20 mg/dL   Creatinine, Ser 9.41  (H) 0.61 - 1.24 mg/dL   Calcium 9.7 8.9 - 10.3 mg/dL   Total Protein 8.9 (H) 6.5 - 8.1 g/dL   Albumin 3.5 3.5 - 5.0 g/dL   AST 9 (L) 15 - 41 U/L   ALT 8 (L) 17 - 63 U/L   Alkaline Phosphatase 64 38 - 126 U/L   Total Bilirubin 0.5 0.3 - 1.2 mg/dL   GFR calc non Af Amer 5 (L) >60 mL/min   GFR calc Af Amer 6 (L) >60 mL/min   Anion gap 8 5 - 15  CBC  Result Value Ref Range   WBC 6.6 4.0 - 10.5 K/uL   RBC 3.11 (L) 4.22 - 5.81 MIL/uL   Hemoglobin 7.8 (L) 13.0 - 17.0 g/dL   HCT 24.2 (L) 39.0 - 52.0 %   MCV 77.8 (L) 78.0 - 100.0 fL   MCH 25.1 (L) 26.0 - 34.0 pg   MCHC 32.2 30.0 - 36.0 g/dL   RDW 17.0 (H) 11.5 - 15.5 %   Platelets 406 (H) 150 - 400 K/uL  Urinalysis, Routine w reflex microscopic  Result Value Ref Range   Color, Urine YELLOW YELLOW   APPearance TURBID (A) CLEAR   Specific Gravity, Urine 1.010 1.005 - 1.030   pH 7.5 5.0 - 8.0   Glucose, UA NEGATIVE NEGATIVE mg/dL   Hgb urine dipstick LARGE (A) NEGATIVE   Bilirubin Urine NEGATIVE NEGATIVE   Ketones, ur NEGATIVE NEGATIVE mg/dL   Protein, ur 100 (A) NEGATIVE mg/dL   Nitrite NEGATIVE NEGATIVE   Leukocytes, UA LARGE (A) NEGATIVE  Urine microscopic-add on  Result Value Ref Range   Squamous Epithelial / LPF 0-5 (A) NONE SEEN   WBC, UA TOO NUMEROUS TO COUNT 0 - 5 WBC/hpf   RBC / HPF TOO NUMEROUS TO COUNT 0 - 5 RBC/hpf   Bacteria, UA MANY (A) NONE SEEN   No results found.   EKG Interpretation  Date/Time:  Saturday November 17 2015 02:30:20 EDT Ventricular Rate:  63 PR Interval:    QRS Duration: 105 QT Interval:  379 QTC Calculation: 388 R Axis:   59 Text Interpretation:  Sinus rhythm Prolonged PR interval Confirmed by [facility]  MD, Alize Borrayo ([id number]) on 11/17/2015 3:10:52 AM      Procedures Procedures (including critical care time)  Medications Ordered in ED Medications  lidocaine (XYLOCAINE) 2 % jelly 1 application (not administered)  sodium chloride 0.9 % bolus 1,000 mL (not administered)  sodium chloride  0.9 % bolus 1,000 mL (1,000 mLs Intravenous New Bag/Given 11/17/15 0209)  cefTRIAXone (ROCEPHIN) 1 g in dextrose 5 % 50 mL IVPB (1 g Intravenous New Bag/Given 11/17/15 0209)     Initial Impression / Assessment and Plan / ED Course  I have reviewed the triage vital signs and the nursing notes.  Pertinent labs & imaging results that were available during my care of the patient were reviewed by me and considered in my medical decision making (see chart for details).  Clinical Course  Value Comment By Time  Leukocytes, UA: (!) LARGE (Reviewed) Helyn App  [place] 11/04 0122   2 boluses given for dehydration with rocephin for UTI, Cude catheter placed with 1400 CC of thick tan urine, pain markedly improved.  Will admit for above symptoms I personally performed the services described in this documentation, which was scribed in my presence. The recorded information has been reviewed and is accurate.      Veatrice Kells, MD 11/17/15 [phone]

## 2015-11-17 NOTE — Progress Notes (Addendum)
Subjective: Patient admitted this morning, see detailed H&P by Dr Blaine Hamper. 70 y.o. male with medical history significant of hypertension, hyperlipidemia, CAD, MI, s/p of CABG4, status post repair of ventricular septal defect in September 2011, sCHf with EF 45%, CKD stage III, urethral stricture, chronic bilateral hydronephrosis, anemia, presents with abdominal pain. Patient found to be in acute kidney injury with creatinine 9.41, BUN 100. CT scan showed bilateral hydronephrosis. Foley catheter placed.  Vitals:   11/17/15 0316 11/17/15 0438  BP: 148/99 134/88  Pulse: 78 82  Resp: 17 16  Temp: 97.6 F (36.4 C) 97.7 F (36.5 C)      A/P  Acute kidney injury secondary to bladder outlet obstruction UTI Hypertension CAD Chronic systolic CHF  Called and discussed with urologist on call, no intervention required at this time. Continue Foley catheter Follow labs in a.m. 1 dose of Kayexalate given for hyperkalemia. Urology following the patient in the hospital  [place] Hospitalist Pager- [phone]

## 2015-11-17 NOTE — H&P (Signed)
History and Physical    TABB CROGHAN NWG:[id number] DOB: July 31, 1945 DOA: 11/17/2015  Referring MD/NP/PA:   PCP: No PCP Per Patient   Patient coming from:  The patient is coming from home.  At baseline, pt is independent for most of ADL.   Chief Complaint: Abdominal pain  HPI: [name] is a 70 y.o. male with medical history significant of hypertension, hyperlipidemia, CAD, MI, s/p of CABG4, status post repair of ventricular septal defect in September 2011, sCHf with EF 45%, CKD stage III, urethral stricture, chronic bilateral hydronephrosis, anemia, presents with abdominal pain.  pt states that he has been having abdominal pain in the past 3 days. His abdominal pain is located in the lower abdomen, constant, 8 out of 10 in severity, pressure-like, nonradiating. He has decreased urine output and difficulty urinating during the same period of time. He has nausea and vomited 3 times without blood in the vomitus. He denies dysuria or burning on urination. He states that sometimes they feel dizzy, but no unilateral weakness, numbness or tingling sensations in extremities. No vision change or hearing loss. Patient has mild shortness of breath on exertion, but no chest pain, tenderness over calf areas. No cough, fever or chills.  Foley cath was placed in ED, and 1400 cc of urine removed. Pt's abdominal pain has improved.  Of note, patient was admitted from 01/24/15-01/30/15 due to bladder outlet obstruction induced acute renal injury, which resolved after Foley catheter was placed by urologist.  ED Course: pt was found to have WBC 6.6, hemoglobin 7.8 which was 10.2 on 04/03/15, lipase 80, positive urinalysis with large amount of leukocyte, worsening renal function with creatinine up from 2.14 on 03/27/15--> 9.41 and BUN 100, potassium 5.8 without EKG change, CABG are normal, no tachycardia, O2 stat 100% on room air. Patient is admitted to telemetry bed as inpatient.  # CT-per renal stone protocol  showed relatively severe chronic bilateral hydronephrosis noted, significantly worsened from the prior study, with underlying mild renal atrophy. Hydronephrosis is somewhat worse on the right. This may reflect chronic distal obstruction due to previously described  urethral stricture. Diffuse bladder wall thickening again noted, with underlying enlarged prostate, 1.2 cm hyperdense left renal cyst noted.  Review of Systems:   General: no fevers, chills, no changes in body weight, has fatigue HEENT: no blurry vision, hearing changes or sore throat Respiratory: has dyspnea, no coughing, wheezing CV: no chest pain, no palpitations GI: has nausea, vomiting, abdominal pain, no diarrhea, constipation GU: no dysuria, burning on urination, increased urinary frequency, hematuria. Has difficulty urinating and decreased urine output  Ext: no leg edema Neuro: no unilateral weakness, numbness, or tingling, no vision change or hearing loss Skin: no rash, no skin tear. MSK: No muscle spasm, no deformity, no limitation of range of movement in spin Heme: No easy bruising.  Travel history: No recent long distant travel.  Allergy:  Allergies  Allergen Reactions  . Chlorhexidine Gluconate Other (See Comments)    Red skin and flaked skin     Past Medical History:  Diagnosis Date  . Acute MI, inferior wall ([place])   . Amputation finger    right first finger top portion age 47  . CHF (congestive heart failure) ([place])   . Coronary artery disease    severe 3 vessel   . Fall   . Foley catheter in place   . H/O hyperkalemia   . H/O thrombocytosis   . Hip fracture, left ([place])    11/2014  .  History of blood transfusion   . History of metabolic acidosis   . History of tobacco abuse   . Hypertension   . Obstructive uropathy   . Pneumonia    childhood   . S/P VSD repair    09/2009  . Trichomonas infection   . Urethral stricture due to infective diseases classified elsewhere   . Urinary tract infection       Past Surgical History:  Procedure Laterality Date  . CORONARY ARTERY BYPASS GRAFT     times 4  . CYSTOSCOPY W/ RETROGRADES N/A 04/03/2015   Procedure:  BLADDER BIOPSIES;  Surgeon: Festus Aloe, MD;  Location: [facility];  Service: Urology;  Laterality: N/A;  . CYSTOSCOPY WITH URETHRAL DILATATION N/A 04/03/2015   Procedure: CYSTOSCOPY WITH BILATERAL RETROGRADES;  Surgeon: Festus Aloe, MD;  Location: [facility];  Service: Urology;  Laterality: N/A;  . flexible cystoscopy    . repair of post infarction posterior ventricular septal defect      Social History:  reports that he quit smoking about 5 years ago. His smoking use included Cigarettes. He has a 67.50 pack-year smoking history. He has never used smokeless tobacco. He reports that he does not drink alcohol or use drugs.  Family History: No family history on file.   Prior to Admission medications   Medication Sig Start Date End Date Taking? Authorizing Provider  amLODipine (NORVASC) 10 MG tablet Take 0.5 tablets (5 mg total) by mouth daily. Patient taking differently: Take 10 mg by mouth daily.  01/30/15   Nishant Dhungel, MD  aspirin EC 81 MG tablet Take 1 tablet (81 mg total) by mouth daily. 11/06/14   Lelon Perla, MD  carvedilol (COREG) 12.5 MG tablet take 1 tablet by mouth twice a day with meals 11/30/14   Lelon Perla, MD  levofloxacin (LEVAQUIN) 250 MG tablet Take 1 tablet (250 mg total) by mouth daily. 04/03/15   Festus Aloe, MD  lisinopril (PRINIVIL,ZESTRIL) 10 MG tablet Take 10 mg by mouth daily.    Historical Provider, MD  rosuvastatin (CRESTOR) 40 MG tablet take 1 tablet by mouth once daily 11/30/14   Lelon Perla, MD  tamsulosin (FLOMAX) 0.4 MG CAPS capsule Take 1 capsule (0.4 mg total) by mouth daily after supper. 04/03/15   Festus Aloe, MD    Physical Exam: Vitals:   11/17/15 0017 11/17/15 0230  BP: 105/66 132/84  Pulse: 73 68  Resp: 20 17  Temp: 97.9 F (36.6 C)   TempSrc: Oral   SpO2:  100%   Weight: 79 kg (174 lb 4 oz)   Height: 5\' 6"  (1.676 m)    General: Not in acute distress HEENT:       Eyes: PERRL, EOMI, no scleral icterus.       ENT: No discharge from the ears and nose, no pharynx injection, no tonsillar enlargement.        Neck: No JVD, no bruit, no mass felt. Heme: No neck lymph node enlargement. Cardiac: S1/S2, RRR, No murmurs, No gallops or rubs. Respiratory: No rales, wheezing, rhonchi or rubs. GI: Soft, mildly distended, has tenderness over lower abdomen, no rebound pain, no organomegaly, BS present. GU: No hematuria Ext: No pitting leg edema bilaterally. 2+DP/PT pulse bilaterally. Musculoskeletal: No joint deformities, No joint redness or warmth, no limitation of ROM in spin. Skin: No rashes.  Neuro: Alert, oriented X3, cranial nerves II-XII grossly intact, moves all extremities normally.   Psych: Patient is not psychotic, no suicidal or hemocidal ideation.  Labs on Admission: I have personally reviewed following labs and imaging studies  CBC:  Recent Labs Lab 11/17/15 0053  WBC 6.6  HGB 7.8*  HCT 24.2*  MCV 77.8*  PLT 578*   Basic Metabolic Panel:  Recent Labs Lab 11/17/15 0053  NA 131*  K 5.8*  CL 111  CO2 12*  GLUCOSE 116*  BUN 100*  CREATININE 9.41*  CALCIUM 9.7   GFR: Estimated Creatinine Clearance: 7 mL/min (by C-G formula based on SCr of 9.41 mg/dL (H)). Liver Function Tests:  Recent Labs Lab 11/17/15 0053  AST 9*  ALT 8*  ALKPHOS 64  BILITOT 0.5  PROT 8.9*  ALBUMIN 3.5    Recent Labs Lab 11/17/15 0053  LIPASE 80*   No results for input(s): AMMONIA in the last 168 hours. Coagulation Profile: No results for input(s): INR, PROTIME in the last 168 hours. Cardiac Enzymes: No results for input(s): CKTOTAL, CKMB, CKMBINDEX, TROPONINI in the last 168 hours. BNP (last 3 results) No results for input(s): PROBNP in the last 8760 hours. HbA1C: No results for input(s): HGBA1C in the last 72 hours. CBG: No  results for input(s): GLUCAP in the last 168 hours. Lipid Profile: No results for input(s): CHOL, HDL, LDLCALC, TRIG, CHOLHDL, LDLDIRECT in the last 72 hours. Thyroid Function Tests: No results for input(s): TSH, T4TOTAL, FREET4, T3FREE, THYROIDAB in the last 72 hours. Anemia Panel: No results for input(s): VITAMINB12, FOLATE, FERRITIN, TIBC, IRON, RETICCTPCT in the last 72 hours. Urine analysis:    Component Value Date/Time   COLORURINE YELLOW 11/17/2015 0048   APPEARANCEUR TURBID (A) 11/17/2015 0048   LABSPEC 1.010 11/17/2015 0048   PHURINE 7.5 11/17/2015 0048   GLUCOSEU NEGATIVE 11/17/2015 0048   HGBUR LARGE (A) 11/17/2015 0048   BILIRUBINUR NEGATIVE 11/17/2015 0048   KETONESUR NEGATIVE 11/17/2015 0048   PROTEINUR 100 (A) 11/17/2015 0048   UROBILINOGEN 0.2 08/29/2010 1726   NITRITE NEGATIVE 11/17/2015 0048   LEUKOCYTESUR LARGE (A) 11/17/2015 0048   Sepsis Labs: @LABRCNTIP (procalcitonin:4,lacticidven:4) )No results found for this or any previous visit (from the past 240 hour(s)).   Radiological Exams on Admission: No results found.   EKG: Independently reviewed.  Sinus rhythm, QTC 388, Q wave in lead 3 and aVF, no T-wave peaking.  Assessment/Plan Principal Problem:   Abdominal pain Active Problems:   HYPERTENSION, BENIGN   CAD, AUTOLOGOUS BYPASS GRAFT   Chronic systolic CHF (congestive heart failure) (H[facility])   Hyperlipidemia   Acute renal failure superimposed on stage 3 chronic kidney disease (HCC)   Hyperkalemia   UTI (urinary tract infection)   Microcytic anemia   Abdominal pain: Most likely due to urinary retention secondary to urethral stricture, causing worsening hydronephrosis. UTI may have partially contributed. pt symptoms improved after Foley catheter placement and 1400 mL urine removed. -will admitted to telemetry bed as inpatient -Expect postobstructive diuresis -IV fluid: 2 L normal saline bolus, followed by 100 mL per hour -When necessary Zofran for  nausea and percoct for pain -continue Flomx -May consult to urology in AM  UTI: -IV Rocephin -Follow-up blood culture and urine culture   AoCKD-III: Baseline Cre is 2.14, pt's Cre 9.41 and BUN 100. Mostly due to bladder outlet obstruciton.  -expecting improving with release of obstruction by foley catch. - IVF as above - Check FeNa  - Follow up renal function by BMP - Hold Lisinopril  HTN: -Hold lisinopril -Continue amlodipine and Coreg -When necessary hydralazine IV  CAD, AUTOLOGOUS BYPASS GRAFT: No chest pain. -Continue aspirin, Coreg, Crestor  Mild shortness of breath: Patient does not have oxygen saturation, no cough, no fever or chills.less likely to have pneumonia or PE. Probably due to abdominal distention secondary to urinary retention, pushing up and limited lung expansion. -When necessary albuterol and has her  Chronic systolic CHF (congestive heart failure) ([place]): 2-D echo 03/23/12 showed EF 45%. Patient is not taking diuretics at home. No leg edema or JVD. CHF compensated. -Check BNP -Continue aspirin, Coreg  HLD: Last LDL was 35 on 09/06/13 -Continue home medications: Crestor  Hyperkalemia: K=5.8 without T wave peaking -Kayexalate, 30 g 1  Microcytic anemia: Hemoglobin 7.8 which was 10.2 on 04/03/15. -Anemia panel   DVT ppx: SCD Code Status: Full code Family Communication: None at bed side. Disposition Plan:  Anticipate discharge back to previous home environment Consults called:  None Admission status:  Inpatient/tele  Date of Service 11/17/2015    Ivor Costa [facility] Pager [phone]  If 7PM-7AM, please contact night-coverage [email or web address] Password [email or web address] 11/17/2015, 2:53 AM

## 2015-11-17 NOTE — ED Triage Notes (Signed)
Patient c/o lower abdominal pain x3 days.  Patient states that has had nausea and vomiting.  Patient states that has vomited x3 in the last 24 hours.  Patient states that he has been unable to keep fluid or solid foods down x3 days.  Denies chest pain, denies SOB.

## 2015-11-17 NOTE — ED Notes (Signed)
20 min timer started.  

## 2015-11-18 LAB — CBC
HCT: 22.7 % — ABNORMAL LOW (ref 39.0–52.0)
HEMOGLOBIN: 7.3 g/dL — AB (ref 13.0–17.0)
MCH: 25.3 pg — AB (ref 26.0–34.0)
MCHC: 32.2 g/dL (ref 30.0–36.0)
MCV: 78.5 fL (ref 78.0–100.0)
Platelets: 386 10*3/uL (ref 150–400)
RBC: 2.89 MIL/uL — AB (ref 4.22–5.81)
RDW: 17.4 % — ABNORMAL HIGH (ref 11.5–15.5)
WBC: 8 10*3/uL (ref 4.0–10.5)

## 2015-11-18 LAB — COMPREHENSIVE METABOLIC PANEL
ALBUMIN: 2.8 g/dL — AB (ref 3.5–5.0)
ALK PHOS: 56 U/L (ref 38–126)
ALT: 6 U/L — AB (ref 17–63)
AST: 12 U/L — AB (ref 15–41)
Anion gap: 4 — ABNORMAL LOW (ref 5–15)
BUN: 78 mg/dL — AB (ref 6–20)
CALCIUM: 8.8 mg/dL — AB (ref 8.9–10.3)
CO2: 14 mmol/L — AB (ref 22–32)
CREATININE: 7.77 mg/dL — AB (ref 0.61–1.24)
Chloride: 120 mmol/L — ABNORMAL HIGH (ref 101–111)
GFR calc Af Amer: 7 mL/min — ABNORMAL LOW (ref >60)
GFR calc non Af Amer: 6 mL/min — ABNORMAL LOW (ref >60)
GLUCOSE: 120 mg/dL — AB (ref 65–99)
Potassium: 4.6 mmol/L (ref 3.5–5.1)
SODIUM: 138 mmol/L (ref 135–145)
Total Bilirubin: 0.5 mg/dL (ref 0.3–1.2)
Total Protein: 7.9 g/dL (ref 6.5–8.1)

## 2015-11-18 LAB — URINE CULTURE

## 2015-11-18 LAB — FOLATE: Folate: 5.7 ng/mL — ABNORMAL LOW (ref >5.9)

## 2015-11-18 LAB — GLUCOSE, CAPILLARY: Glucose-Capillary: 103 mg/dL — ABNORMAL HIGH (ref 65–99)

## 2015-11-18 LAB — RETICULOCYTES
RBC.: 2.87 MIL/uL — AB (ref 4.22–5.81)
RETIC CT PCT: 0.7 % (ref 0.4–3.1)
Retic Count, Absolute: 20.1 10*3/uL (ref 19.0–186.0)

## 2015-11-18 LAB — IRON AND TIBC
Iron: 39 ug/dL — ABNORMAL LOW (ref 45–182)
SATURATION RATIOS: 20 % (ref 17.9–39.5)
TIBC: 193 ug/dL — ABNORMAL LOW (ref 250–450)
UIBC: 154 ug/dL

## 2015-11-18 LAB — VITAMIN B12: VITAMIN B 12: 332 pg/mL (ref 180–914)

## 2015-11-18 LAB — FERRITIN: FERRITIN: 231 ng/mL (ref 24–336)

## 2015-11-18 MED ORDER — PRO-STAT SUGAR FREE PO LIQD
30.0000 mL | Freq: Two times a day (BID) | ORAL | Status: DC
  Administered 2015-11-18 – 2015-11-22 (×9): 30 mL via ORAL
  Filled 2015-11-18 (×9): qty 30

## 2015-11-18 NOTE — Progress Notes (Signed)
[facility]  PROGRESS NOTE  [name] PYP:[id number] DOB: Apr 05, 1945 DOA: 11/17/2015 PCP: No PCP Per Patient   Brief HPI:   70 y.o.malewith medical history significant of hypertension, hyperlipidemia, CAD, MI, s/p of CABG4, status post repair of ventricular septal defect in September 2011, sCHf with EF 45%,CKD stage III,urethral stricture, chronic bilateral hydronephrosis, anemia, presents with abdominal pain. Patient found to be in acute kidney injury with creatinine 9.41, BUN 100. CT scan showed bilateral hydronephrosis. Foley catheter placed.    Subjective   Patient seen and examined, BUN/creatinine is slowly improving. He denies pain or shortness of breath.   Assessment/Plan:     1. Acute kidney injury due to bladder outlet obstruction- creatinine slowly improving today creatinine is 7.7, down from 8.9 yesterday. Urology has seen the patient and recommend continuing IV fluids. We will continue with IV normal saline at 100 mL per hour. Will discontinue tamsulosin as per urology recommendation 2. Hyperkalemia- resolved, today potassium 4.6. 3. UTI- patient was empirically started on ceftriaxone, urine culture growing random multiple species. Will discontinue ceftriaxone at this time. 4. Anemia of chronic disease- patient's baseline hemoglobin around 9-10, when patient came hemoglobin was 7.8, today hemoglobin is 7.3, likely from dilution from IV fluids. Will check stool for occult blood. Follow CBC in a.m. 5. Hypertension- continue Coreg, Amlodipine. Blood pressure stable. 6. CAD- stable, continue aspirin. 7. Chronic systolic CHF- well compensated at this time no shortness of breath.    DVT prophylaxis: SCDs, will avoid heparin due to significant hematuria  Code Status: Full code  Family Communication: No family present at bedside   Disposition Plan: Home when medically stable   Consultants:  Urology  Procedures:  None  Continuos infusions . sodium  chloride 100 mL/hr at 11/17/15 1500      Antibiotics:   Anti-infectives    Start     Dose/Rate Route Frequency Ordered Stop   11/18/15 0200  cefTRIAXone (ROCEPHIN) 1 g in dextrose 5 % 50 mL IVPB     1 g 100 mL/hr over 30 Minutes Intravenous Every 24 hours 11/17/15 0322     11/17/15 0145  cefTRIAXone (ROCEPHIN) 1 g in dextrose 5 % 50 mL IVPB     1 g 100 mL/hr over 30 Minutes Intravenous  Once 11/17/15 0141 11/17/15 0351       Objective   Vitals:   11/17/15 0438 11/17/15 1418 11/17/15 2059 11/18/15 0522  BP: 134/88 110/67 109/72 120/76  Pulse: 82 85 76 75  Resp: 16 18 18 18   Temp: 97.7 F (36.5 C) 98.3 F (36.8 C) 98.1 F (36.7 C) 98.2 F (36.8 C)  TempSrc: Oral Oral Oral Oral  SpO2:  100% 100% 100%  Weight: 77.2 kg (170 lb 4.8 oz)     Height: 5\' 7"  (1.702 m)       Intake/Output Summary (Last 24 hours) at 11/18/15 1223 Last data filed at 11/18/15 0600  Gross per 24 hour  Intake             3270 ml  Output             3851 ml  Net             -581 ml   Filed Weights   11/17/15 0017 11/17/15 0438  Weight: 79 kg (174 lb 4 oz) 77.2 kg (170 lb 4.8 oz)     Physical Examination:  General exam: Appears calm and comfortable. Respiratory system: Clear to auscultation. Respiratory effort normal. Cardiovascular system:  RRR. No  murmurs, rubs, gallops. No pedal edema. GI system: Abdomen is nondistended, soft and nontender. No organomegaly.  Central nervous system. No focal neurological deficits. 5 x 5 power in all extremities. Skin: No rashes, lesions or ulcers. Psychiatry: Alert, oriented x 3.Judgement and insight appear normal. Affect normal.    Data Reviewed: I have personally reviewed following labs and imaging studies  CBG:  Recent Labs Lab 11/17/15 0734 11/18/15 0728  GLUCAP 109* 103*    CBC:  Recent Labs Lab 11/17/15 0053 11/17/15 0352 11/18/15 0606  WBC 6.6 6.8 8.0  HGB 7.8* 7.7* 7.3*  HCT 24.2* 23.7* 22.7*  MCV 77.8* 77.7* 78.5  PLT 406*  412* 213    Basic Metabolic Panel:  Recent Labs Lab 11/17/15 0053 11/17/15 0352 11/18/15 0606  NA 131* 133* 138  K 5.8* 5.7* 4.6  CL 111 112* 120*  CO2 12* 14* 14*  GLUCOSE 116* 116* 120*  BUN 100* 95* 78*  CREATININE 9.41* 8.99* 7.77*  CALCIUM 9.7 9.4 8.8*    Recent Results (from the past 240 hour(s))  Urine culture     Status: Abnormal   Collection Time: 11/17/15 12:48 AM  Result Value Ref Range Status   Specimen Description URINE, RANDOM  Final   Special Requests NONE  Final   Culture MULTIPLE SPECIES PRESENT, SUGGEST RECOLLECTION (A)  Final   Report Status 11/18/2015 FINAL  Final     Liver Function Tests:  Recent Labs Lab 11/17/15 0053 11/18/15 0606  AST 9* 12*  ALT 8* 6*  ALKPHOS 64 56  BILITOT 0.5 0.5  PROT 8.9* 7.9  ALBUMIN 3.5 2.8*    Recent Labs Lab 11/17/15 0053  LIPASE 80*   No results for input(s): AMMONIA in the last 168 hours.  Cardiac Enzymes: No results for input(s): CKTOTAL, CKMB, CKMBINDEX, TROPONINI in the last 168 hours. BNP (last 3 results)  Recent Labs  11/17/15 0352  BNP 100.7*    ProBNP (last 3 results) No results for input(s): PROBNP in the last 8760 hours.    Studies: Ct Renal Stone Study  Result Date: 11/17/2015 CLINICAL DATA:  Acute onset of lower abdominal pain, nausea and vomiting. Initial encounter. EXAM: CT ABDOMEN AND PELVIS WITHOUT CONTRAST TECHNIQUE: Multidetector CT imaging of the abdomen and pelvis was performed following the standard protocol without IV contrast. COMPARISON:  CT of the abdomen and pelvis performed 01/26/2015 FINDINGS: Lower chest: Minimal left basilar atelectasis is noted. The patient is status post median sternotomy. The patient is status post remote left ventricular myocardial infarction, with associated wall calcification. Hepatobiliary: The liver is unremarkable in appearance. The gallbladder is unremarkable in appearance. The common bile duct remains normal in caliber. Pancreas: The  pancreas is within normal limits. Spleen: The spleen is unremarkable in appearance. Adrenals/Urinary Tract: The adrenal glands are unremarkable in appearance. Relatively severe chronic bilateral hydronephrosis is noted, with underlying mild renal atrophy. Hydronephrosis is somewhat worse on the right. Bilateral perinephric stranding is seen. A 1.2 cm hyperdense cyst is noted at the interpole region of the left kidney. No renal or ureteral stones are identified. Stomach/Bowel: The stomach is unremarkable in appearance. The small bowel is within normal limits. The appendix is normal in caliber, without evidence of appendicitis. The colon is unremarkable in appearance. Vascular/Lymphatic: Scattered calcification is seen along the abdominal aorta and its branches. The abdominal aorta is otherwise grossly unremarkable. The inferior vena cava is grossly unremarkable. No retroperitoneal lymphadenopathy is seen. No pelvic sidewall lymphadenopathy is identified. Reproductive: The  prostate is enlarged, measuring 5.3 cm in AP dimension. Diffuse bladder wall thickening likely reflects underlying chronic inflammation. Severe bilateral hydronephrosis may reflect chronic distal obstruction due to previously described urethral stricture. Other: No additional soft tissue abnormalities are seen. Musculoskeletal: No acute osseous abnormalities are identified. The visualized musculature is unremarkable in appearance. IMPRESSION: 1. No acute abnormality seen to explain the patient's symptoms. 2. Relatively severe chronic bilateral hydronephrosis noted, significantly worsened from the prior study, with underlying mild renal atrophy. Hydronephrosis is somewhat worse on the right. This may reflect chronic distal obstruction due to previously described urethral stricture. Diffuse bladder wall thickening again noted, with underlying enlarged prostate. 3. Status post remote left ventricular myocardial infarction, with associated mild wall  calcification. 4. 1.2 cm hyperdense left renal cyst noted. 5. Scattered aortic atherosclerosis. Electronically Signed   By: Garald Balding M.D.   On: 11/17/2015 02:57    Scheduled Meds: . amLODipine  10 mg Oral Daily  . aspirin EC  81 mg Oral Daily  . carvedilol  12.5 mg Oral BID WC  . cefTRIAXone (ROCEPHIN) IVPB 1 gram/50 mL D5W  1 g Intravenous Q24H  . Influenza vac split quadrivalent PF  0.5 mL Intramuscular Tomorrow-1000  . pneumococcal 23 valent vaccine  0.5 mL Intramuscular Tomorrow-1000  . rosuvastatin  40 mg Oral Daily  . sodium chloride flush  3 mL Intravenous Q12H  . tamsulosin  0.4 mg Oral QPC supper      Time spent: 25 min  [facility] Pager [phone]. If 7PM-7AM, please contact night-coverage at [email or web address], Office  [phone]  password [place] 11/18/2015, 12:23 PM  LOS: 1 day

## 2015-11-18 NOTE — Consult Note (Signed)
Subjective: CC: Abdominal pain  Hx:  I was asked to see [name] in consultation by Dr. Ivor Costa for urinary retention with ARI.   [name] is followed by Dr. Junious Silk in our office and required foley placement in 1/17 for AUR with ARI and a history of CRI with a Cr of 2.14 in 3/17.  He had cystoscopy with bilateral retrogrades and bladder biopsy in 3/21.  He was admitted yesterday with a 3 day history of progressive abdominal pain and difficulty voiding.   He was found to have a Cr of 9.41 on admission and a CT showed marked bilateral hydro with a distended bladder.   A foley was placed with the return of 1415ml.   He had no hematuria prior to admission but did have hematuria with decompression which is not unusual.  His urine is clear today.   He had been on tamsulosin prior to admission.   His pain resolved with foley placement and his Cr is down to 7.77.    ROS:  Review of Systems  Constitutional: Negative for chills and fever.  Gastrointestinal: Positive for abdominal pain.  All other systems reviewed and are negative.   Allergies  Allergen Reactions  . Chlorhexidine Gluconate Other (See Comments)    Red skin and flaked skin     Past Medical History:  Diagnosis Date  . Acute MI, inferior wall ([place])   . Amputation finger    right first finger top portion age 55  . CHF (congestive heart failure) ([place])   . Coronary artery disease    severe 3 vessel   . Fall   . Foley catheter in place   . H/O hyperkalemia   . H/O thrombocytosis   . Hip fracture, left ([place])    11/2014  . History of blood transfusion   . History of metabolic acidosis   . History of tobacco abuse   . Hypertension   . Obstructive uropathy   . Pneumonia    childhood   . S/P VSD repair    09/2009  . Trichomonas infection   . Urethral stricture due to infective diseases classified elsewhere   . Urinary tract infection     Past Surgical History:  Procedure Laterality Date  . CORONARY ARTERY BYPASS GRAFT      times 4  . CYSTOSCOPY W/ RETROGRADES N/A 04/03/2015   Procedure:  BLADDER BIOPSIES;  Surgeon: Festus Aloe, MD;  Location: [facility];  Service: Urology;  Laterality: N/A;  . CYSTOSCOPY WITH URETHRAL DILATATION N/A 04/03/2015   Procedure: CYSTOSCOPY WITH BILATERAL RETROGRADES;  Surgeon: Festus Aloe, MD;  Location: [facility];  Service: Urology;  Laterality: N/A;  . flexible cystoscopy    . repair of post infarction posterior ventricular septal defect      Social History   Social History  . Marital status: Divorced    Spouse name: N/A  . Number of children: N/A  . Years of education: N/A   Occupational History  . Not on file.   Social History Main Topics  . Smoking status: Former Smoker    Packs/day: 1.50    Years: 45.00    Types: Cigarettes    Quit date: 01/13/2010  . Smokeless tobacco: Never Used  . Alcohol use No  . Drug use: No  . Sexual activity: Not on file   Other Topics Concern  . Not on file   Social History Narrative  . No narrative on file    No family history on file.  Anti-infectives: Anti-infectives    Start     Dose/Rate Route Frequency Ordered Stop   11/18/15 0200  cefTRIAXone (ROCEPHIN) 1 g in dextrose 5 % 50 mL IVPB     1 g 100 mL/hr over 30 Minutes Intravenous Every 24 hours 11/17/15 0322     11/17/15 0145  cefTRIAXone (ROCEPHIN) 1 g in dextrose 5 % 50 mL IVPB     1 g 100 mL/hr over 30 Minutes Intravenous  Once 11/17/15 0141 11/17/15 0351      Current Facility-Administered Medications  Medication Dose Route Frequency Provider Last Rate Last Dose  . 0.9 %  sodium chloride infusion   Intravenous Continuous Ivor Costa, MD 100 mL/hr at 11/17/15 1500    . acetaminophen (TYLENOL) tablet 650 mg  650 mg Oral Q6H PRN Ivor Costa, MD       Or  . acetaminophen (TYLENOL) suppository 650 mg  650 mg Rectal Q6H PRN Ivor Costa, MD      . albuterol (PROVENTIL) (2.5 MG/3ML) 0.083% nebulizer solution 2.5 mg  2.5 mg Nebulization Q6H PRN Ivor Costa, MD      .  amLODipine (NORVASC) tablet 10 mg  10 mg Oral Daily Ivor Costa, MD   10 mg at 11/17/15 0926  . aspirin EC tablet 81 mg  81 mg Oral Daily Ivor Costa, MD   81 mg at 11/17/15 0926  . carvedilol (COREG) tablet 12.5 mg  12.5 mg Oral BID WC Ivor Costa, MD   12.5 mg at 11/18/15 0851  . cefTRIAXone (ROCEPHIN) 1 g in dextrose 5 % 50 mL IVPB  1 g Intravenous Q24H Ivor Costa, MD   1 g at 11/18/15 0121  . hydrALAZINE (APRESOLINE) injection 5 mg  5 mg Intravenous Q2H PRN Ivor Costa, MD      . Influenza vac split quadrivalent PF (FLUARIX) injection 0.5 mL  0.5 mL Intramuscular Tomorrow-1000 Ivor Costa, MD      . lidocaine (XYLOCAINE) 2 % jelly 1 application  1 application Urethral Once PRN April Palumbo, MD      . ondansetron [facility]) tablet 4 mg  4 mg Oral Q6H PRN Ivor Costa, MD       Or  . ondansetron [facility]) injection 4 mg  4 mg Intravenous Q6H PRN Ivor Costa, MD      . oxyCODONE-acetaminophen (PERCOCET/ROXICET) 5-325 MG per tablet 1 tablet  1 tablet Oral Q4H PRN Ivor Costa, MD      . pneumococcal 23 valent vaccine (PNU-IMMUNE) injection 0.5 mL  0.5 mL Intramuscular Tomorrow-1000 Ivor Costa, MD      . rosuvastatin (CRESTOR) tablet 40 mg  40 mg Oral Daily Ivor Costa, MD   40 mg at 11/17/15 0926  . sodium chloride flush (NS) 0.9 % injection 3 mL  3 mL Intravenous Q12H Ivor Costa, MD   3 mL at 11/17/15 2200  . tamsulosin (FLOMAX) capsule 0.4 mg  0.4 mg Oral QPC supper Ivor Costa, MD   0.4 mg at 11/17/15 1726  . zolpidem (AMBIEN) tablet 5 mg  5 mg Oral QHS PRN Ivor Costa, MD       Past medical, surgical and social history reviewed.   Objective: Vital signs in last 24 hours: Temp:  [98.1 F (36.7 C)-98.3 F (36.8 C)] 98.2 F (36.8 C) (11/05 0522) Pulse Rate:  [75-85] 75 (11/05 0522) Resp:  [18] 18 (11/05 0522) BP: (109-120)/(67-76) 120/76 (11/05 0522) SpO2:  [100 %] 100 % (11/05 0522)  Intake/Output from previous day: 11/04 0701 - 11/05 0700  In: 3270 [P.O.:720; I.V.:2500; IV Piggyback:50] Out: 5176 [Urine:4650;  Emesis/NG output:200; Stool:1] Intake/Output this shift: No intake/output data recorded.   Physical Exam  Constitutional: He is oriented to person, place, and time and well-developed, well-nourished, and in no distress.  HENT:  Head: Normocephalic and atraumatic.  Neck: Normal range of motion. Neck supple.  Cardiovascular: Normal rate, regular rhythm and normal heart sounds.   Pulmonary/Chest: Effort normal and breath sounds normal. No respiratory distress.  Abdominal: Soft. He exhibits no distension and no mass. There is no tenderness.  Genitourinary:  Genitourinary Comments: Foley in place, draining clear urine.   Musculoskeletal: Normal range of motion. He exhibits no edema or tenderness.  Neurological: He is alert and oriented to person, place, and time.  Skin: Skin is warm and dry.  Psychiatric: Mood and affect normal.  Vitals reviewed.   Lab Results:   Recent Labs  11/17/15 0352 11/18/15 0606  WBC 6.8 8.0  HGB 7.7* 7.3*  HCT 23.7* 22.7*  PLT 412* 386   BMET  Recent Labs  11/17/15 0352 11/18/15 0606  NA 133* 138  K 5.7* 4.6  CL 112* 120*  CO2 14* 14*  GLUCOSE 116* 120*  BUN 95* 78*  CREATININE 8.99* 7.77*  CALCIUM 9.4 8.8*   PT/INR  Recent Labs  11/17/15 0352  LABPROT 15.5*  INR 1.22   ABG No results for input(s): PHART, HCO3 in the last 72 hours.  Invalid input(s): PCO2, PO2  Studies/Results: Ct Renal Stone Study  Result Date: 11/17/2015 CLINICAL DATA:  Acute onset of lower abdominal pain, nausea and vomiting. Initial encounter. EXAM: CT ABDOMEN AND PELVIS WITHOUT CONTRAST TECHNIQUE: Multidetector CT imaging of the abdomen and pelvis was performed following the standard protocol without IV contrast. COMPARISON:  CT of the abdomen and pelvis performed 01/26/2015 FINDINGS: Lower chest: Minimal left basilar atelectasis is noted. The patient is status post median sternotomy. The patient is status post remote left ventricular myocardial infarction,  with associated wall calcification. Hepatobiliary: The liver is unremarkable in appearance. The gallbladder is unremarkable in appearance. The common bile duct remains normal in caliber. Pancreas: The pancreas is within normal limits. Spleen: The spleen is unremarkable in appearance. Adrenals/Urinary Tract: The adrenal glands are unremarkable in appearance. Relatively severe chronic bilateral hydronephrosis is noted, with underlying mild renal atrophy. Hydronephrosis is somewhat worse on the right. Bilateral perinephric stranding is seen. A 1.2 cm hyperdense cyst is noted at the interpole region of the left kidney. No renal or ureteral stones are identified. Stomach/Bowel: The stomach is unremarkable in appearance. The small bowel is within normal limits. The appendix is normal in caliber, without evidence of appendicitis. The colon is unremarkable in appearance. Vascular/Lymphatic: Scattered calcification is seen along the abdominal aorta and its branches. The abdominal aorta is otherwise grossly unremarkable. The inferior vena cava is grossly unremarkable. No retroperitoneal lymphadenopathy is seen. No pelvic sidewall lymphadenopathy is identified. Reproductive: The prostate is enlarged, measuring 5.3 cm in AP dimension. Diffuse bladder wall thickening likely reflects underlying chronic inflammation. Severe bilateral hydronephrosis may reflect chronic distal obstruction due to previously described urethral stricture. Other: No additional soft tissue abnormalities are seen. Musculoskeletal: No acute osseous abnormalities are identified. The visualized musculature is unremarkable in appearance. IMPRESSION: 1. No acute abnormality seen to explain the patient's symptoms. 2. Relatively severe chronic bilateral hydronephrosis noted, significantly worsened from the prior study, with underlying mild renal atrophy. Hydronephrosis is somewhat worse on the right. This may reflect chronic distal obstruction due to previously  described  urethral stricture. Diffuse bladder wall thickening again noted, with underlying enlarged prostate. 3. Status post remote left ventricular myocardial infarction, with associated mild wall calcification. 4. 1.2 cm hyperdense left renal cyst noted. 5. Scattered aortic atherosclerosis. Electronically Signed   By: Garald Balding M.D.   On: 11/17/2015 02:57   Case discussed with Dr. Blaine Hamper.  I have reviewed the CT films and reports and prior imaging.  I have reviewed the hospital notes and our prior office notes.  I have reviewed his labs.  Assessment: BPH with BOO and recurrent retention with bilateral hydronephrosis and ARI which is improving post decompression.  He will need follow up with Dr. Junious Silk in the office and the foley needs to be left indwelling.   No real need to continue the tamsulosin at this time.     CC: Dr. Ivor Costa and Dr. Eda Keys.      Oddie Bottger J 11/18/2015 [phone]

## 2015-11-18 NOTE — Progress Notes (Signed)
Initial Nutrition Assessment  DOCUMENTATION CODES:   Non-severe (moderate) malnutrition in context of chronic illness  INTERVENTION:   Provide Prostat liquid protein PO 30 ml BID with meals, each supplement provides 100 kcal, 15 grams protein. Encourage PO intake RD to continue to monitor  NUTRITION DIAGNOSIS:   Malnutrition related to chronic illness as evidenced by percent weight loss, moderate depletion of body fat, moderate depletions of muscle mass.  GOAL:   Patient will meet greater than or equal to 90% of their needs  MONITOR:   PO intake, Supplement acceptance, Labs, Weight trends, I & O's  REASON FOR ASSESSMENT:   Consult Assessment of nutrition requirement/status  ASSESSMENT:   70 y.o. male with medical history significant of hypertension, hyperlipidemia, CAD, MI, s/p of CABG4, status post repair of ventricular septal defect in September 2011, sCHf with EF 45%, CKD stage III, urethral stricture, chronic bilateral hydronephrosis, anemia, presents with abdominal pain.  Pt in room wrapped up in a blanket. Pt reports improved appetite since admission. His lunch tray in room  Was 75% eaten. PO intakes in chart: 50%. Pt states he has not felt like eating d/t poor appetite. Pt states he didn't eat anything for 3 days PTA d/t N/V. Denies nausea today. Pt states he wants nothing sweet, as sweet tastes cause him to become nauseous. Pt is willing to try Prostat supplements with some hesitation. Assured paient he could decline supplement if he didn't like them.   Pt reports UBW is >200 lb. Per chart review, pt has lost 33 lb since 3/21 (16% wt loss x 7.5 months, significant for time frame). Nutrition-Focused physical exam completed. Findings are moderate fat depletion, moderate muscle depletion, and no edema.   Medications: NS infusion at 100 ml/hr. Labs reviewed: CBGs: 103-109 GFR: 7  Diet Order:  Diet renal with fluid restriction Fluid restriction: 1200 mL Fluid; Room  service appropriate? Yes; Fluid consistency: Thin  Skin:  Reviewed, no issues  Last BM:  11/1  Height:   Ht Readings from Last 1 Encounters:  11/17/15 5\' 7"  (1.702 m)    Weight:   Wt Readings from Last 1 Encounters:  11/17/15 170 lb 4.8 oz (77.2 kg)    Ideal Body Weight:  67.3 kg  BMI:  Body mass index is 26.67 kg/m.  Estimated Nutritional Needs:   Kcal:  1950-2150  Protein:  90-100g  Fluid:  Per MD  EDUCATION NEEDS:   Education needs addressed  Clayton Bibles, MS, RD, LDN Pager: [phone] After Hours Pager: [phone]

## 2015-11-19 LAB — BASIC METABOLIC PANEL
ANION GAP: 9 (ref 5–15)
BUN: 71 mg/dL — ABNORMAL HIGH (ref 6–20)
CALCIUM: 9.3 mg/dL (ref 8.9–10.3)
CO2: 14 mmol/L — ABNORMAL LOW (ref 22–32)
Chloride: 117 mmol/L — ABNORMAL HIGH (ref 101–111)
Creatinine, Ser: 6.46 mg/dL — ABNORMAL HIGH (ref 0.61–1.24)
GFR, EST AFRICAN AMERICAN: 9 mL/min — AB (ref >60)
GFR, EST NON AFRICAN AMERICAN: 8 mL/min — AB (ref >60)
GLUCOSE: 106 mg/dL — AB (ref 65–99)
Potassium: 4.9 mmol/L (ref 3.5–5.1)
Sodium: 140 mmol/L (ref 135–145)

## 2015-11-19 LAB — GLUCOSE, CAPILLARY: Glucose-Capillary: 112 mg/dL — ABNORMAL HIGH (ref 65–99)

## 2015-11-19 LAB — CBC
HCT: 23.6 % — ABNORMAL LOW (ref 39.0–52.0)
Hemoglobin: 7.5 g/dL — ABNORMAL LOW (ref 13.0–17.0)
MCH: 24.9 pg — ABNORMAL LOW (ref 26.0–34.0)
MCHC: 31.8 g/dL (ref 30.0–36.0)
MCV: 78.4 fL (ref 78.0–100.0)
PLATELETS: 380 10*3/uL (ref 150–400)
RBC: 3.01 MIL/uL — ABNORMAL LOW (ref 4.22–5.81)
RDW: 17.8 % — AB (ref 11.5–15.5)
WBC: 7.3 10*3/uL (ref 4.0–10.5)

## 2015-11-19 NOTE — Evaluation (Signed)
Physical Therapy Evaluation Patient Details Name: DOVER HEAD MRN: [id number] DOB: 1945/12/01 Today's Date: 11/19/2015   History of Present Illness  70 yo male admitted with abd pain, acute kidney injury. Hx of HTN, CAD, MI, CABG, cardiomyopathy, CKD  Clinical Impression  Mod encouragement for participation with PT on today. On eval, pt required Min assist for mobility. He walked ~15 feet in room today. At pt's request, assessed gait and balance with use of cane. Pt could not demonstrate ability to safely ambulate with only the use of a cane. He c/o being "swimmy headed" during session. He currently is at high risk for falls. Explained to pt that I cannot leave cane in room for use/recommend cane until he can safely ambulate with PT. Will continue to follow and progress activity as tolerated. Pt may require a RW for ambulation-will continue to assess.     Follow Up Recommendations Home health PT;Supervision/Assistance - 24 hour    Equipment Recommendations   (continuing to assess)    Recommendations for Other Services       Precautions / Restrictions Precautions Precautions: Fall Restrictions Weight Bearing Restrictions: No      Mobility  Bed Mobility Overal bed mobility: Modified Independent                Transfers Overall transfer level: Needs assistance Equipment used: Straight cane Transfers: Sit to/from Stand Sit to Stand: Min assist         General transfer comment: x2. On 1st attempt pt had to sit back down due to dizziness/unsteadiness. Sat for at least 3 minutes before 2nd attempt. Pt was still unsteady but he was able to progress to taking a few steps.  Ambulation/Gait Ambulation/Gait assistance: Min assist Ambulation Distance (Feet): 15 Feet Assistive device: Straight cane Gait Pattern/deviations: Step-through pattern;Decreased stride length     General Gait Details: Assessed gait with straight cane at pt's request. As we begin to take steps, pt  stated he needed IV pole to hold on to, in addition to the cane. Explained to pt that in order for me to recommend straight cane, he has to demonstrate ability to safely use device.   Stairs            Wheelchair Mobility    Modified Rankin (Stroke Patients Only)       Balance Overall balance assessment: Needs assistance         Standing balance support: Single extremity supported Standing balance-Leahy Scale: Poor Standing balance comment: very unsteady and at risk for falls                              Pertinent Vitals/Pain Pain Assessment: No/denies pain    Home Living Family/patient expects to be discharged to:: Private residence Living Arrangements: Children   Type of Home: Apartment Home Access: Stairs to enter   Technical brewer of Steps: 2 Home Layout: Two level Home Equipment: None      Prior Function                 Hand Dominance        Extremity/Trunk Assessment   Upper Extremity Assessment: Overall WFL for tasks assessed           Lower Extremity Assessment: Generalized weakness      Cervical / Trunk Assessment: Normal  Communication   Communication: No difficulties  Cognition Arousal/Alertness: Awake/alert Behavior During Therapy: WFL for tasks assessed/performed Overall Cognitive Status:  Within Functional Limits for tasks assessed                      General Comments      Exercises     Assessment/Plan    PT Assessment Patient needs continued PT services  PT Problem List Decreased strength;Decreased mobility;Decreased activity tolerance;Decreased balance;Decreased knowledge of use of DME          PT Treatment Interventions DME instruction;Therapeutic activities;Gait training;Therapeutic exercise;Patient/family education;Functional mobility training;Stair training;Balance training    PT Goals (Current goals can be found in the Care Plan section)  Acute Rehab PT Goals Patient Stated  Goal: home soon. to be able to use a cane for ambulation PT Goal Formulation: With patient Time For Goal Achievement: 12/03/15 Potential to Achieve Goals: Good    Frequency Min 3X/week   Barriers to discharge        Co-evaluation               End of Session Equipment Utilized During Treatment: Gait belt Activity Tolerance:  (Limited by dizziness) Patient left: in bed;with call bell/phone within reach;with bed alarm set           Time: 1520-1535 PT Time Calculation (min) (ACUTE ONLY): 15 min   Charges:   PT Evaluation $PT Eval Low Complexity: 1 Procedure     PT G Codes:        Weston Anna, MPT Pager: [phone]

## 2015-11-19 NOTE — Progress Notes (Signed)
[facility]  PROGRESS NOTE  [name] QIW:[id number] DOB: [birth year] DOA: 11/17/2015 PCP: No PCP Per Patient   Brief HPI:   70 y.o.malewith medical history significant of hypertension, hyperlipidemia, CAD, MI, s/p of CABG4, status post repair of ventricular septal defect in September 2011, sCHf with EF 45%,CKD stage III,urethral stricture, chronic bilateral hydronephrosis, anemia, presents with abdominal pain. Patient found to be in acute kidney injury with creatinine 9.41, BUN 100. CT scan showed bilateral hydronephrosis. Foley catheter placed.    Subjective   Patient seen and examined, BUN/creatinine is slowly improving. He denies pain or shortness of breath.   Assessment/Plan:     1. Acute kidney injury due to bladder outlet obstruction- creatinine slowly improving today creatinine is 6.46 down from 7.77 yesterday. Urology has seen the patient and recommend continuing IV fluids. We will continue with IV normal saline at 100 mL per hour. Will discontinue tamsulosin as per urology recommendation 2. Hyperkalemia- resolved, today potassium 4.6. 3. UTI- patient was empirically started on ceftriaxone, urine culture growing random multiple species. Will discontinue ceftriaxone at this time. 4. Anemia of chronic disease- patient's baseline hemoglobin around 9-10, when patient came hemoglobin was 7.8, today hemoglobin is 7.3, likely from dilution from IV fluids. Will check stool for occult blood. Follow CBC in a.m. 5. Hypertension- continue Coreg, Amlodipine. Blood pressure stable. 6. CAD- stable, continue aspirin. 7. Chronic systolic CHF- well compensated at this time no shortness of breath.    DVT prophylaxis: SCDs, will avoid heparin due to significant hematuria  Code Status: Full code  Family Communication: No family present at bedside   Disposition Plan: Home when medically stable   Consultants:  Urology  Procedures:  None  Continuos infusions . sodium  chloride 100 mL/hr at 11/19/15 0445      Antibiotics:   Anti-infectives    Start     Dose/Rate Route Frequency Ordered Stop   11/18/15 0200  cefTRIAXone (ROCEPHIN) 1 g in dextrose 5 % 50 mL IVPB  Status:  Discontinued     1 g 100 mL/hr over 30 Minutes Intravenous Every 24 hours 11/17/15 0322 11/18/15 1234   11/17/15 0145  cefTRIAXone (ROCEPHIN) 1 g in dextrose 5 % 50 mL IVPB     1 g 100 mL/hr over 30 Minutes Intravenous  Once 11/17/15 0141 11/17/15 0351       Objective   Vitals:   11/18/15 1405 11/18/15 1659 11/18/15 2148 11/19/15 0444  BP: (!) 96/56 108/65 116/68 126/62  Pulse: 79 75 72 74  Resp: 18  18 16   Temp: 98.1 F (36.7 C)  97.9 F (36.6 C) 97.9 F (36.6 C)  TempSrc: Oral  Oral Oral  SpO2: 100%  100% 100%  Weight:    76.3 kg (168 lb 3.2 oz)  Height:        Intake/Output Summary (Last 24 hours) at 11/19/15 1154 Last data filed at 11/19/15 0933  Gross per 24 hour  Intake             3920 ml  Output             5500 ml  Net            -1580 ml   Filed Weights   11/17/15 0017 11/17/15 0438 11/19/15 0444  Weight: 79 kg (174 lb 4 oz) 77.2 kg (170 lb 4.8 oz) 76.3 kg (168 lb 3.2 oz)     Physical Examination:  General exam: Appears calm and comfortable. Respiratory system: Clear to  auscultation. Respiratory effort normal. Cardiovascular system:  RRR. No  murmurs, rubs, gallops. No pedal edema. GI system: Abdomen is nondistended, soft and nontender. No organomegaly.  Central nervous system. No focal neurological deficits. 5 x 5 power in all extremities. Skin: No rashes, lesions or ulcers. Psychiatry: Alert, oriented x 3.Judgement and insight appear normal. Affect normal.    Data Reviewed: I have personally reviewed following labs and imaging studies  CBG:  Recent Labs Lab 11/17/15 0734 11/18/15 0728 11/19/15 0759  GLUCAP 109* 103* 112*    CBC:  Recent Labs Lab 11/17/15 0053 11/17/15 0352 11/18/15 0606 11/19/15 0452  WBC 6.6 6.8 8.0 7.3   HGB 7.8* 7.7* 7.3* 7.5*  HCT 24.2* 23.7* 22.7* 23.6*  MCV 77.8* 77.7* 78.5 78.4  PLT 406* 412* 386 254    Basic Metabolic Panel:  Recent Labs Lab 11/17/15 0053 11/17/15 0352 11/18/15 0606 11/19/15 0452  NA 131* 133* 138 140  K 5.8* 5.7* 4.6 4.9  CL 111 112* 120* 117*  CO2 12* 14* 14* 14*  GLUCOSE 116* 116* 120* 106*  BUN 100* 95* 78* 71*  CREATININE 9.41* 8.99* 7.77* 6.46*  CALCIUM 9.7 9.4 8.8* 9.3    Recent Results (from the past 240 hour(s))  Urine culture     Status: Abnormal   Collection Time: 11/17/15 12:48 AM  Result Value Ref Range Status   Specimen Description URINE, RANDOM  Final   Special Requests NONE  Final   Culture MULTIPLE SPECIES PRESENT, SUGGEST RECOLLECTION (A)  Final   Report Status 11/18/2015 FINAL  Final     Liver Function Tests:  Recent Labs Lab 11/17/15 0053 11/18/15 0606  AST 9* 12*  ALT 8* 6*  ALKPHOS 64 56  BILITOT 0.5 0.5  PROT 8.9* 7.9  ALBUMIN 3.5 2.8*    Recent Labs Lab 11/17/15 0053  LIPASE 80*   No results for input(s): AMMONIA in the last 168 hours.  Cardiac Enzymes: No results for input(s): CKTOTAL, CKMB, CKMBINDEX, TROPONINI in the last 168 hours. BNP (last 3 results)  Recent Labs  11/17/15 0352  BNP 100.7*    ProBNP (last 3 results) No results for input(s): PROBNP in the last 8760 hours.    Studies: No results found.  Scheduled Meds: . aspirin EC  81 mg Oral Daily  . carvedilol  12.5 mg Oral BID WC  . feeding supplement (PRO-STAT SUGAR FREE 64)  30 mL Oral BID  . Influenza vac split quadrivalent PF  0.5 mL Intramuscular Tomorrow-1000  . pneumococcal 23 valent vaccine  0.5 mL Intramuscular Tomorrow-1000  . rosuvastatin  40 mg Oral Daily  . sodium chloride flush  3 mL Intravenous Q12H      Time spent: 25 min  [facility] Pager [phone]. If 7PM-7AM, please contact night-coverage at [email or web address], Office  [phone]  password [place] 11/19/2015, 11:54 AM  LOS: 2 days

## 2015-11-19 NOTE — Care Management Note (Signed)
Case Management Note  Patient Details  Name: [name] MRN: [id number] Date of Birth: 1945/04/29  Subjective/Objective:  70 y/o m admitted w/abd pain. From home. Referral for need for cane. Recc PT cons-already ordered, if recc then Nsg aware to have attending to put in home dme order.Continue to monitor.                  Action/Plan:d/c plan home.   Expected Discharge Date:                  Expected Discharge Plan:  Home/Self Care  In-House Referral:     Discharge planning Services  CM Consult  Post Acute Care Choice:    Choice offered to:     DME Arranged:    DME Agency:     HH Arranged:    HH Agency:     Status of Service:  In process, will continue to follow  If discussed at Long Length of Stay Meetings, dates discussed:    Additional Comments:  Dessa Phi, RN 11/19/2015, 11:06 AM

## 2015-11-20 LAB — BASIC METABOLIC PANEL
Anion gap: 7 (ref 5–15)
BUN: 65 mg/dL — AB (ref 6–20)
CHLORIDE: 116 mmol/L — AB (ref 101–111)
CO2: 16 mmol/L — AB (ref 22–32)
CREATININE: 4.96 mg/dL — AB (ref 0.61–1.24)
Calcium: 8.9 mg/dL (ref 8.9–10.3)
GFR calc Af Amer: 12 mL/min — ABNORMAL LOW (ref >60)
GFR calc non Af Amer: 11 mL/min — ABNORMAL LOW (ref >60)
Glucose, Bld: 102 mg/dL — ABNORMAL HIGH (ref 65–99)
Potassium: 4.7 mmol/L (ref 3.5–5.1)
SODIUM: 139 mmol/L (ref 135–145)

## 2015-11-20 LAB — COMPREHENSIVE METABOLIC PANEL
ALK PHOS: 62 U/L (ref 38–126)
ALT: 14 U/L — AB (ref 17–63)
AST: 15 U/L (ref 15–41)
Albumin: 2.8 g/dL — ABNORMAL LOW (ref 3.5–5.0)
Anion gap: 5 (ref 5–15)
BUN: 64 mg/dL — ABNORMAL HIGH (ref 6–20)
CALCIUM: 8.4 mg/dL — AB (ref 8.9–10.3)
CO2: 15 mmol/L — ABNORMAL LOW (ref 22–32)
CREATININE: 4.55 mg/dL — AB (ref 0.61–1.24)
Chloride: 113 mmol/L — ABNORMAL HIGH (ref 101–111)
GFR, EST AFRICAN AMERICAN: 14 mL/min — AB (ref >60)
GFR, EST NON AFRICAN AMERICAN: 12 mL/min — AB (ref >60)
Glucose, Bld: 103 mg/dL — ABNORMAL HIGH (ref 65–99)
Potassium: 4.4 mmol/L (ref 3.5–5.1)
Sodium: 133 mmol/L — ABNORMAL LOW (ref 135–145)
TOTAL PROTEIN: 7.6 g/dL (ref 6.5–8.1)
Total Bilirubin: 0.2 mg/dL — ABNORMAL LOW (ref 0.3–1.2)

## 2015-11-20 LAB — GLUCOSE, CAPILLARY: GLUCOSE-CAPILLARY: 84 mg/dL (ref 65–99)

## 2015-11-20 NOTE — Progress Notes (Addendum)
Pt feels well. He reports he was voiding with a good stream prior to this current episode.   Vitals:   11/19/15 2116 11/20/15 0527  BP: 130/89 102/67  Pulse: 68 78  Resp: 16 16  Temp: 98.4 F (36.9 C) 98.3 F (36.8 C)    Intake/Output Summary (Last 24 hours) at 11/20/15 1237 Last data filed at 11/20/15 4268  Gross per 24 hour  Intake             3240 ml  Output             6325 ml  Net            -3085 ml   NAD Eating lunch Urine clear  A/P - urinary retention - Cr continues to improve and UOP is excellent.  I suspect he may have an atonic bladder. He was voiding and catheter passed with leans against a stricture. Discussed importance of f/u. His compliance has been a major hindrance to his care. He said he "lost our number". He should be d/c'd with foley when medically stable and I'll perform cystoscopy/void trial in office in the week or two.

## 2015-11-20 NOTE — Progress Notes (Signed)
[facility]  PROGRESS NOTE  [name] JOI:[id number] DOB: 1945/07/01 DOA: 11/17/2015 PCP: No PCP Per Patient   Brief HPI:   70 y.o.malewith medical history significant of hypertension, hyperlipidemia, CAD, MI, s/p of CABG4, status post repair of ventricular septal defect in September 2011, sCHf with EF 45%,CKD stage III,urethral stricture, chronic bilateral hydronephrosis, anemia, presents with abdominal pain. Patient found to be in acute kidney injury with creatinine 9.41, BUN 100. CT scan showed bilateral hydronephrosis. Foley catheter placed.    Subjective   Patient seen and examined, BUN/creatinine is slowly improving. He denies pain or shortness of breath.   Assessment/Plan:     1. Acute kidney injury due to bladder outlet obstruction- creatinine slowly improving today creatinine is 4.96 down from 6.46 yesterday. Urology has seen the patient and recommend continuing IV fluids and Foley catheter. We will continue with IV normal saline at 100 mL per hour. Patient's baseline creatinine is around 2.0.Will discontinue tamsulosin as per urology recommendation. Patient will follow-up with urology Dr. Junious Silk in 2 weeks after discharge. Follow BMP in a.m. 2. Hyperkalemia- resolved, today potassium 4.7. 3. UTI- patient was empirically started on ceftriaxone, urine culture growing random multiple species. Will discontinue ceftriaxone at this time. 4. Anemia of chronic disease- patient's baseline hemoglobin around 9-10, when patient came hemoglobin was 7.8, today hemoglobin is 7.5, likely from dilution from IV fluids. Will check stool for occult blood. Follow CBC in a.m. 5. Hypertension- continue Coreg, Amlodipine. Blood pressure stable. 6. CAD- stable, continue aspirin. 7. Chronic systolic CHF- well compensated at this time no shortness of breath.    DVT prophylaxis: SCDs, will avoid heparin due to significant hematuria  Code Status: Full code  Family Communication: No  family present at bedside   Disposition Plan: Home when medically stable   Consultants:  Urology  Procedures:  None  Continuos infusions . sodium chloride 100 mL/hr at 11/20/15 0900      Antibiotics:   Anti-infectives    Start     Dose/Rate Route Frequency Ordered Stop   11/18/15 0200  cefTRIAXone (ROCEPHIN) 1 g in dextrose 5 % 50 mL IVPB  Status:  Discontinued     1 g 100 mL/hr over 30 Minutes Intravenous Every 24 hours 11/17/15 0322 11/18/15 1234   11/17/15 0145  cefTRIAXone (ROCEPHIN) 1 g in dextrose 5 % 50 mL IVPB     1 g 100 mL/hr over 30 Minutes Intravenous  Once 11/17/15 0141 11/17/15 0351       Objective   Vitals:   11/19/15 1319 11/19/15 1708 11/19/15 2116 11/20/15 0527  BP: 102/69 134/82 130/89 102/67  Pulse: 77 63 68 78  Resp: 16  16 16   Temp: 98.1 F (36.7 C)  98.4 F (36.9 C) 98.3 F (36.8 C)  TempSrc: Oral  Oral Oral  SpO2: 100%  100% 100%  Weight:    75 kg (165 lb 4.8 oz)  Height:        Intake/Output Summary (Last 24 hours) at 11/20/15 1320 Last data filed at 11/20/15 0902  Gross per 24 hour  Intake             3000 ml  Output             5225 ml  Net            -2225 ml   Filed Weights   11/17/15 0438 11/19/15 0444 11/20/15 0527  Weight: 77.2 kg (170 lb 4.8 oz) 76.3 kg (168 lb 3.2 oz)  75 kg (165 lb 4.8 oz)     Physical Examination:  General exam: Appears calm and comfortable. Respiratory system: Clear to auscultation. Respiratory effort normal. Cardiovascular system:  RRR. No  murmurs, rubs, gallops. No pedal edema. GI system: Abdomen is nondistended, soft and nontender. No organomegaly.  Central nervous system. No focal neurological deficits. 5 x 5 power in all extremities. Skin: No rashes, lesions or ulcers. Psychiatry: Alert, oriented x 3.Judgement and insight appear normal. Affect normal.    Data Reviewed: I have personally reviewed following labs and imaging studies  CBG:  Recent Labs Lab 11/17/15 0734  11/18/15 0728 11/19/15 0759 11/20/15 0724  GLUCAP 109* 103* 112* 84    CBC:  Recent Labs Lab 11/17/15 0053 11/17/15 0352 11/18/15 0606 11/19/15 0452  WBC 6.6 6.8 8.0 7.3  HGB 7.8* 7.7* 7.3* 7.5*  HCT 24.2* 23.7* 22.7* 23.6*  MCV 77.8* 77.7* 78.5 78.4  PLT 406* 412* 386 734    Basic Metabolic Panel:  Recent Labs Lab 11/17/15 0053 11/17/15 0352 11/18/15 0606 11/19/15 0452 11/20/15 0452  NA 131* 133* 138 140 139  K 5.8* 5.7* 4.6 4.9 4.7  CL 111 112* 120* 117* 116*  CO2 12* 14* 14* 14* 16*  GLUCOSE 116* 116* 120* 106* 102*  BUN 100* 95* 78* 71* 65*  CREATININE 9.41* 8.99* 7.77* 6.46* 4.96*  CALCIUM 9.7 9.4 8.8* 9.3 8.9    Recent Results (from the past 240 hour(s))  Urine culture     Status: Abnormal   Collection Time: 11/17/15 12:48 AM  Result Value Ref Range Status   Specimen Description URINE, RANDOM  Final   Special Requests NONE  Final   Culture MULTIPLE SPECIES PRESENT, SUGGEST RECOLLECTION (A)  Final   Report Status 11/18/2015 FINAL  Final     Liver Function Tests:  Recent Labs Lab 11/17/15 0053 11/18/15 0606  AST 9* 12*  ALT 8* 6*  ALKPHOS 64 56  BILITOT 0.5 0.5  PROT 8.9* 7.9  ALBUMIN 3.5 2.8*    Recent Labs Lab 11/17/15 0053  LIPASE 80*   No results for input(s): AMMONIA in the last 168 hours.  Cardiac Enzymes: No results for input(s): CKTOTAL, CKMB, CKMBINDEX, TROPONINI in the last 168 hours. BNP (last 3 results)  Recent Labs  11/17/15 0352  BNP 100.7*    ProBNP (last 3 results) No results for input(s): PROBNP in the last 8760 hours.    Studies: No results found.  Scheduled Meds: . aspirin EC  81 mg Oral Daily  . carvedilol  12.5 mg Oral BID WC  . feeding supplement (PRO-STAT SUGAR FREE 64)  30 mL Oral BID  . Influenza vac split quadrivalent PF  0.5 mL Intramuscular Tomorrow-1000  . pneumococcal 23 valent vaccine  0.5 mL Intramuscular Tomorrow-1000  . rosuvastatin  40 mg Oral Daily  . sodium chloride flush  3 mL  Intravenous Q12H      Time spent: 25 min  [place] [facility] Pager [phone]. If 7PM-7AM, please contact night-coverage at [email or web address], Office  [phone]  password [email or web address] 11/20/2015, 1:20 PM  LOS: 3 days

## 2015-11-20 NOTE — Progress Notes (Addendum)
MD notified for Na 133. SRP, RN

## 2015-11-20 NOTE — Progress Notes (Signed)
Report from [place], [place]. Care assumed for pt at this time. Assessment unchanged from previous assessment. Pt resting in bed, no c/o at present. Bed alarm on.

## 2015-11-20 NOTE — Care Management Note (Signed)
Case Management Note  Patient Details  Name: [name] MRN: [id number] Date of Birth: [birth year]  Subjective/Objective:  70 y/o m admitted w/abd pain. PT recc HHPT. Await HHPT,f13f order. Provided patient w/HHC provider list-await choice.                  Action/Plan:d/c plan home w/HHC.   Expected Discharge Date:                  Expected Discharge Plan:  [place]  In-House Referral:     Discharge planning Services  CM Consult  Post Acute Care Choice:    Choice offered to:     DME Arranged:    DME Agency:     HH Arranged:    [place] Agency:     Status of Service:  In process, will continue to follow  If discussed at Long Length of Stay Meetings, dates discussed:    Additional Comments:  Dessa Phi, RN 11/20/2015, 12:58 PM

## 2015-11-20 NOTE — Progress Notes (Signed)
Physical Therapy Treatment Patient Details Name: [name] MRN: [id number] DOB: 1945/09/19 Today's Date: 11/20/2015    History of Present Illness 70 yo male admitted with abd pain, acute kidney injury. Hx of HTN, CAD, MI, CABG, cardiomyopathy, CKD    PT Comments    Pt ambulated in hallway today with cane and min/guard with 1 episode of needing MIN A with LOB when near bed.  Although pt may be steadier with RW, compliance with it would be an issue and pt states he does not want one.  Pt did well overall with cane and feel he would actually use it.  Pt would benefit from ambulating several times in the day with nursing staff as well, as I think pt's slight unsteadiness may be from lack of movement while in the hospital.  Spoke with nursing on encouraging ambulation with IV pole and staff member.  Recommend HHPT for home safety assessment.  Follow Up Recommendations  Home health PT;Supervision/Assistance - 24 hour     Equipment Recommendations  Cane    Recommendations for Other Services       Precautions / Restrictions Precautions Precautions: Fall Restrictions Weight Bearing Restrictions: No    Mobility  Bed Mobility Overal bed mobility: Modified Independent                Transfers Overall transfer level: Needs assistance Equipment used: Straight cane Transfers: Sit to/from Stand Sit to Stand: Min guard         General transfer comment: no c/o dizziness  Ambulation/Gait Ambulation/Gait assistance: Min guard Ambulation Distance (Feet): 300 Feet Assistive device: Straight cane Gait Pattern/deviations: Step-through pattern;Drifts right/left     General Gait Details: Pt able to ambulate with cane today without need for IV pole in other hand.  2 LOB, one pt able to self correct and other when almost back to his bed and needed MIN A although pt  states " I wasn't going to fall"   Stairs            Wheelchair Mobility    Modified Rankin (Stroke  Patients Only)       Balance             Standing balance-Leahy Scale: Fair                      Cognition Arousal/Alertness: Awake/alert Behavior During Therapy: WFL for tasks assessed/performed Overall Cognitive Status: Within Functional Limits for tasks assessed                      Exercises      General Comments        Pertinent Vitals/Pain Pain Assessment: No/denies pain    Home Living                      Prior Function            PT Goals (current goals can now be found in the care plan section) Acute Rehab PT Goals Patient Stated Goal: home soon. to be able to use a cane for ambulation PT Goal Formulation: With patient Time For Goal Achievement: 12/03/15 Potential to Achieve Goals: Good Progress towards PT goals: Progressing toward goals    Frequency    Min 3X/week      PT Plan Current plan remains appropriate    Co-evaluation             End of Session Equipment Utilized During Treatment:  Gait belt Activity Tolerance: Patient tolerated treatment well Patient left: in bed;with call bell/phone within reach;with bed alarm set     Time: 1257-1315 PT Time Calculation (min) (ACUTE ONLY): 18 min  Charges:  $Gait Training: 8-22 mins                    G Codes:      [name] 11/20/2015, 1:28 PM

## 2015-11-21 DIAGNOSIS — I5022 Chronic systolic (congestive) heart failure: Secondary | ICD-10-CM

## 2015-11-21 DIAGNOSIS — N359 Urethral stricture, unspecified: Secondary | ICD-10-CM

## 2015-11-21 DIAGNOSIS — E875 Hyperkalemia: Secondary | ICD-10-CM

## 2015-11-21 DIAGNOSIS — I10 Essential (primary) hypertension: Secondary | ICD-10-CM

## 2015-11-21 DIAGNOSIS — E785 Hyperlipidemia, unspecified: Secondary | ICD-10-CM

## 2015-11-21 DIAGNOSIS — R1030 Lower abdominal pain, unspecified: Secondary | ICD-10-CM

## 2015-11-21 DIAGNOSIS — N179 Acute kidney failure, unspecified: Secondary | ICD-10-CM

## 2015-11-21 DIAGNOSIS — N1330 Unspecified hydronephrosis: Secondary | ICD-10-CM

## 2015-11-21 DIAGNOSIS — I25810 Atherosclerosis of coronary artery bypass graft(s) without angina pectoris: Secondary | ICD-10-CM

## 2015-11-21 LAB — BASIC METABOLIC PANEL
Anion gap: 6 (ref 5–15)
BUN: 63 mg/dL — AB (ref 6–20)
CO2: 15 mmol/L — ABNORMAL LOW (ref 22–32)
Calcium: 8.7 mg/dL — ABNORMAL LOW (ref 8.9–10.3)
Chloride: 115 mmol/L — ABNORMAL HIGH (ref 101–111)
Creatinine, Ser: 4.08 mg/dL — ABNORMAL HIGH (ref 0.61–1.24)
GFR, EST AFRICAN AMERICAN: 15 mL/min — AB (ref >60)
GFR, EST NON AFRICAN AMERICAN: 13 mL/min — AB (ref >60)
Glucose, Bld: 93 mg/dL (ref 65–99)
POTASSIUM: 4.8 mmol/L (ref 3.5–5.1)
SODIUM: 136 mmol/L (ref 135–145)

## 2015-11-21 LAB — CBC
HEMATOCRIT: 22.6 % — AB (ref 39.0–52.0)
Hemoglobin: 7.3 g/dL — ABNORMAL LOW (ref 13.0–17.0)
MCH: 25.4 pg — ABNORMAL LOW (ref 26.0–34.0)
MCHC: 32.3 g/dL (ref 30.0–36.0)
MCV: 78.7 fL (ref 78.0–100.0)
PLATELETS: 373 10*3/uL (ref 150–400)
RBC: 2.87 MIL/uL — ABNORMAL LOW (ref 4.22–5.81)
RDW: 17.6 % — AB (ref 11.5–15.5)
WBC: 9.2 10*3/uL (ref 4.0–10.5)

## 2015-11-21 LAB — GLUCOSE, CAPILLARY: Glucose-Capillary: 94 mg/dL (ref 65–99)

## 2015-11-21 NOTE — Progress Notes (Signed)
Physical Therapy Treatment Patient Details Name: [name] MRN: [id number] DOB: 1945/06/24 Today's Date: 11/21/2015    History of Present Illness 70 yo male admitted with abd pain, acute kidney injury. Hx of HTN, CAD, MI, CABG, cardiomyopathy, CKD    PT Comments    The patient is motivated to ambulate and could benefit from staff ambulating patient. Drifts at times. PLEASE ORDER Patient an single point cane. Continue PT.  Follow Up Recommendations  Home health PT;Supervision/Assistance - 24 hour     Equipment Recommendations  Cane    Recommendations for Other Services       Precautions / Restrictions Precautions Precautions: Fall Precaution Comments: wear shoes Restrictions Weight Bearing Restrictions: No    Mobility  Bed Mobility Overal bed mobility: Independent                Transfers Overall transfer level: Needs assistance Equipment used: Straight cane Transfers: Sit to/from Stand Sit to Stand: Min guard            Ambulation/Gait Ambulation/Gait assistance: Min guard Ambulation Distance (Feet): 300 Feet Assistive device: Straight cane Gait Pattern/deviations: Step-through pattern;Drifts right/left     General Gait Details: Pt able to ambulate with cane, at times drifts  but  does not lose balance. shuffles with boots on.,although pt  states " I wasn't going to fall"   Stairs            Wheelchair Mobility    Modified Rankin (Stroke Patients Only)       Balance Overall balance assessment: Needs assistance         Standing balance support: Single extremity supported Standing balance-Leahy Scale: Fair Standing balance comment: needs UE support with at least 1                    Cognition Arousal/Alertness: Awake/alert Behavior During Therapy: WFL for tasks assessed/performed                        Exercises      General Comments        Pertinent Vitals/Pain Pain Assessment: No/denies pain     Home Living                      Prior Function            PT Goals (current goals can now be found in the care plan section) Progress towards PT goals: Progressing toward goals    Frequency    Min 3X/week      PT Plan Current plan remains appropriate    Co-evaluation             End of Session Equipment Utilized During Treatment: Gait belt Activity Tolerance: Patient tolerated treatment well Patient left: in bed;with call bell/phone within reach;with bed alarm set     Time: 8588-5027 PT Time Calculation (min) (ACUTE ONLY): 25 min  Charges:  $Gait Training: 23-37 mins                    G Codes:      Claretha Cooper 11/21/2015, 10:52 AM Tresa Endo PT [phone]

## 2015-11-21 NOTE — Care Management Important Message (Signed)
Important Message  Patient Details  Name: [name] MRN: [id number] Date of Birth: 1945-03-27   Medicare Important Message Given:  Yes    Camillo Flaming 11/21/2015, 10:10 AMImportant Message  Patient Details  Name: [name] MRN: [id number] Date of Birth: 1945-11-27   Medicare Important Message Given:  Yes    Camillo Flaming 11/21/2015, 10:09 AM

## 2015-11-21 NOTE — Care Management Note (Signed)
Case Management Note  Patient Details  Name: [name] MRN: [id number] Date of Birth: 1945-04-28  Subjective/Objective: PT-recc HHPT, cane.Patient chose [facility], [facility], [facility] for HHPT-rep Manuela Schwartz aware of orders, & d/c in am. [facility] dme rep Jermaine aware of cane order,to deliver to patient's rm prior d/c & d/c in am.                   Action/Plan:d/c home w/HHC/DME.   Expected Discharge Date:                  Expected Discharge Plan:  [place]  In-House Referral:     Discharge planning Services  CM Consult  Post Acute Care Choice:    Choice offered to:  Patient  DME Arranged:  Kasandra Knudsen DME Agency:  [place] Arranged:  PT [facility] Agency:  [place]  Status of Service:  Completed, signed off  If discussed at [place] of Stay Meetings, dates discussed:    Additional Comments:  Dessa Phi, RN 11/21/2015, 2:09 PM

## 2015-11-21 NOTE — Progress Notes (Signed)
  Subjective: Patient reports no complaints.   Objective: Vital signs in last 24 hours: Temp:  [98.1 F (36.7 C)-98.4 F (36.9 C)] 98.4 F (36.9 C) (11/08 0437) Pulse Rate:  [62-67] 62 (11/08 0437) Resp:  [16-18] 16 (11/08 0437) BP: (118-147)/(78-82) 141/78 (11/08 0437) SpO2:  [100 %] 100 % (11/08 0437) Weight:  [76.1 kg (167 lb 12.8 oz)] 76.1 kg (167 lb 12.8 oz) (11/08 0437)  Intake/Output from previous day: 11/07 0701 - 11/08 0700 In: 3015 [P.O.:570; I.V.:2445] Out: 6125 [Urine:6125] Intake/Output this shift: No intake/output data recorded.  Physical Exam:  NAD Urine clear   Lab Results:  Recent Labs  11/19/15 0452 11/21/15 0444  HGB 7.5* 7.3*  HCT 23.6* 22.6*   BMET  Recent Labs  11/20/15 1338 11/21/15 0444  NA 133* 136  K 4.4 4.8  CL 113* 115*  CO2 15* 15*  GLUCOSE 103* 93  BUN 64* 63*  CREATININE 4.55* 4.08*  CALCIUM 8.4* 8.7*   No results for input(s): LABPT, INR in the last 72 hours. No results for input(s): LABURIN in the last 72 hours. Results for orders placed or performed during the hospital encounter of 11/17/15  Urine culture     Status: Abnormal   Collection Time: 11/17/15 12:48 AM  Result Value Ref Range Status   Specimen Description URINE, RANDOM  Final   Special Requests NONE  Final   Culture MULTIPLE SPECIES PRESENT, SUGGEST RECOLLECTION (A)  Final   Report Status 11/18/2015 FINAL  Final    Studies/Results: No results found.  Assessment/Plan: -retention - s/p foley. He'll need cystoscopy in office and urodynamics.  -hydronephrosis, ARF - UOP remains excellent and Cr continues to trend to baseline ~2.    LOS: 4 days   [name] 11/21/2015, 8:02 AM

## 2015-11-21 NOTE — Progress Notes (Signed)
[facility]  PROGRESS NOTE  [name] JQB:[id number] DOB: 04-Oct-1945 DOA: 11/17/2015 PCP: No PCP Per Patient   Brief HPI:   70 y.o.malewith medical history significant of hypertension, hyperlipidemia, CAD, MI, s/p of CABG4, status post repair of ventricular septal defect in September 2011, sCHf with EF 45%,CKD stage III,urethral stricture, chronic bilateral hydronephrosis, anemia, presents with abdominal pain. Patient found to be in acute kidney injury with creatinine 9.41, BUN 100. CT scan showed bilateral hydronephrosis. Foley catheter placed.   Subjective   Patient denies pain or shortness of breath.   Assessment/Plan:    1. Acute kidney injury due to bladder outlet obstruction- creatinine slowly improving today creatinine is 4.96 down from 6.46 yesterday. Urology has seen the patient and recommend continuing IV fluids and Foley catheter. We will continue with IV normal saline but decrease rate, planning for possible discharge 11/9. Patient's baseline creatinine is around 2.0. Discontinued tamsulosin as per urology recommendation. Patient will follow-up with urology Dr. Junious Silk in 2 weeks after discharge. Follow BMP. 2. Hyperkalemia- resolved, today potassium 4.7. 3. UTI- patient was empirically started on ceftriaxone, urine culture growing random multiple species. Will discontinue ceftriaxone at this time. 4. Anemia of chronic disease- patient's baseline hemoglobin around 9-10, when admitted hemoglobin was 7.8 suspect that this was from dilution from IV fluids. 5. Hypertension- continue Coreg, Amlodipine. Blood pressure stable and controlled. 6. CAD- stable, continue aspirin. 7. Chronic systolic CHF- well compensated at this time no shortness of breath.  DVT prophylaxis: SCDs, will avoid heparin due to significant hematuria  Code Status: Full code  Family Communication: No family present at bedside   Disposition Plan: Home with HHPT, possibly 11/9.    Consultants:  Urology  Procedures:  None  Continuos infusions . sodium chloride 100 mL/hr at 11/21/15 0449      Antibiotics:   Anti-infectives    Start     Dose/Rate Route Frequency Ordered Stop   11/18/15 0200  cefTRIAXone (ROCEPHIN) 1 g in dextrose 5 % 50 mL IVPB  Status:  Discontinued     1 g 100 mL/hr over 30 Minutes Intravenous Every 24 hours 11/17/15 0322 11/18/15 1234   11/17/15 0145  cefTRIAXone (ROCEPHIN) 1 g in dextrose 5 % 50 mL IVPB     1 g 100 mL/hr over 30 Minutes Intravenous  Once 11/17/15 0141 11/17/15 0351       Objective   Vitals:   11/20/15 1356 11/20/15 1726 11/20/15 2040 11/21/15 0437  BP: (!) 147/78 124/80 118/82 (!) 141/78  Pulse: 64 67 64 62  Resp: 16 18 18 16   Temp: 98.2 F (36.8 C)  98.1 F (36.7 C) 98.4 F (36.9 C)  TempSrc: Oral  Oral Oral  SpO2: 100% 100% 100% 100%  Weight:    76.1 kg (167 lb 12.8 oz)  Height:        Intake/Output Summary (Last 24 hours) at 11/21/15 1101 Last data filed at 11/21/15 1028  Gross per 24 hour  Intake          3014.99 ml  Output             6875 ml  Net         -3860.01 ml   Filed Weights   11/19/15 0444 11/20/15 0527 11/21/15 0437  Weight: 76.3 kg (168 lb 3.2 oz) 75 kg (165 lb 4.8 oz) 76.1 kg (167 lb 12.8 oz)     Physical Examination:  General exam: Appears calm and comfortable. Respiratory system: Clear to auscultation.  Respiratory effort normal. Cardiovascular system:  RRR. No  murmurs, rubs, gallops. No pedal edema. GI system: Abdomen is nondistended, soft and nontender. No organomegaly.  Central nervous system. No focal neurological deficits. 5 x 5 power in all extremities. Skin: No rashes, lesions or ulcers. Psychiatry: Alert, oriented x 3.Judgement and insight appear normal. Affect normal.    Data Reviewed: I have personally reviewed following labs and imaging studies  CBG:  Recent Labs Lab 11/17/15 0734 11/18/15 0728 11/19/15 0759 11/20/15 0724 11/21/15 0736  GLUCAP  109* 103* 112* 84 94    CBC:  Recent Labs Lab 11/17/15 0053 11/17/15 0352 11/18/15 0606 11/19/15 0452 11/21/15 0444  WBC 6.6 6.8 8.0 7.3 9.2  HGB 7.8* 7.7* 7.3* 7.5* 7.3*  HCT 24.2* 23.7* 22.7* 23.6* 22.6*  MCV 77.8* 77.7* 78.5 78.4 78.7  PLT 406* 412* 386 380 768    Basic Metabolic Panel:  Recent Labs Lab 11/18/15 0606 11/19/15 0452 11/20/15 0452 11/20/15 1338 11/21/15 0444  NA 138 140 139 133* 136  K 4.6 4.9 4.7 4.4 4.8  CL 120* 117* 116* 113* 115*  CO2 14* 14* 16* 15* 15*  GLUCOSE 120* 106* 102* 103* 93  BUN 78* 71* 65* 64* 63*  CREATININE 7.77* 6.46* 4.96* 4.55* 4.08*  CALCIUM 8.8* 9.3 8.9 8.4* 8.7*    Recent Results (from the past 240 hour(s))  Urine culture     Status: Abnormal   Collection Time: 11/17/15 12:48 AM  Result Value Ref Range Status   Specimen Description URINE, RANDOM  Final   Special Requests NONE  Final   Culture MULTIPLE SPECIES PRESENT, SUGGEST RECOLLECTION (A)  Final   Report Status 11/18/2015 FINAL  Final     Liver Function Tests:  Recent Labs Lab 11/17/15 0053 11/18/15 0606 11/20/15 1338  AST 9* 12* 15  ALT 8* 6* 14*  ALKPHOS 64 56 62  BILITOT 0.5 0.5 0.2*  PROT 8.9* 7.9 7.6  ALBUMIN 3.5 2.8* 2.8*    Recent Labs Lab 11/17/15 0053  LIPASE 80*   No results for input(s): AMMONIA in the last 168 hours.  Cardiac Enzymes: No results for input(s): CKTOTAL, CKMB, CKMBINDEX, TROPONINI in the last 168 hours. BNP (last 3 results)  Recent Labs  11/17/15 0352  BNP 100.7*    ProBNP (last 3 results) No results for input(s): PROBNP in the last 8760 hours.    Studies: No results found.  Scheduled Meds: . aspirin EC  81 mg Oral Daily  . carvedilol  12.5 mg Oral BID WC  . feeding supplement (PRO-STAT SUGAR FREE 64)  30 mL Oral BID  . Influenza vac split quadrivalent PF  0.5 mL Intramuscular Tomorrow-1000  . pneumococcal 23 valent vaccine  0.5 mL Intramuscular Tomorrow-1000  . rosuvastatin  40 mg Oral Daily  . sodium  chloride flush  3 mL Intravenous Q12H   Time spent: 25 min  Aidin Doane [employer] [phone]. If 7PM-7AM, please contact night-coverage at [email or web address], Office  [phone]  password [email or web address] 11/21/2015, 11:01 AM  LOS: 4 days

## 2015-11-22 DIAGNOSIS — N183 Chronic kidney disease, stage 3 (moderate): Secondary | ICD-10-CM

## 2015-11-22 LAB — BASIC METABOLIC PANEL
ANION GAP: 6 (ref 5–15)
BUN: 64 mg/dL — ABNORMAL HIGH (ref 6–20)
CHLORIDE: 113 mmol/L — AB (ref 101–111)
CO2: 16 mmol/L — AB (ref 22–32)
Calcium: 9 mg/dL (ref 8.9–10.3)
Creatinine, Ser: 3.77 mg/dL — ABNORMAL HIGH (ref 0.61–1.24)
GFR calc non Af Amer: 15 mL/min — ABNORMAL LOW (ref >60)
GFR, EST AFRICAN AMERICAN: 17 mL/min — AB (ref >60)
Glucose, Bld: 92 mg/dL (ref 65–99)
Potassium: 5.2 mmol/L — ABNORMAL HIGH (ref 3.5–5.1)
SODIUM: 135 mmol/L (ref 135–145)

## 2015-11-22 LAB — CBC
HCT: 22.8 % — ABNORMAL LOW (ref 39.0–52.0)
HEMOGLOBIN: 7.4 g/dL — AB (ref 13.0–17.0)
MCH: 25.5 pg — AB (ref 26.0–34.0)
MCHC: 32.5 g/dL (ref 30.0–36.0)
MCV: 78.6 fL (ref 78.0–100.0)
Platelets: 376 10*3/uL (ref 150–400)
RBC: 2.9 MIL/uL — AB (ref 4.22–5.81)
RDW: 17.6 % — ABNORMAL HIGH (ref 11.5–15.5)
WBC: 9 10*3/uL (ref 4.0–10.5)

## 2015-11-22 LAB — GLUCOSE, CAPILLARY
GLUCOSE-CAPILLARY: 158 mg/dL — AB (ref 65–99)
GLUCOSE-CAPILLARY: 117 mg/dL — AB (ref 65–99)

## 2015-11-22 MED ORDER — PRO-STAT SUGAR FREE PO LIQD: 30.0000 mL | Freq: Two times a day (BID) | ORAL | 0 refills | Status: DC

## 2015-11-22 MED ORDER — SODIUM POLYSTYRENE SULFONATE 15 GM/60ML PO SUSP
15.0000 g | Freq: Once | ORAL | Status: AC
  Administered 2015-11-22: 15 g via ORAL
  Filled 2015-11-22: qty 60

## 2015-11-22 MED ORDER — FUROSEMIDE 10 MG/ML IJ SOLN: 20.0000 mg | Freq: Once | INTRAMUSCULAR | Status: DC

## 2015-11-22 MED ORDER — FUROSEMIDE 20 MG PO TABS
20.0000 mg | ORAL_TABLET | Freq: Every day | ORAL | Status: DC
  Administered 2015-11-22: 20 mg via ORAL
  Filled 2015-11-22: qty 1

## 2015-11-22 NOTE — Progress Notes (Signed)
Lost IV access, pt refused new IV due to potential discharge today. Educated patient on therapeutic benefits of IV fluids, pt still refused. On-call paged, waiting for BMP results to determine if IV access necessary per on-call orders.

## 2015-11-22 NOTE — Discharge Instructions (Signed)
Foley Catheter Care, Adult °A Foley catheter is a soft, flexible tube that is placed into the bladder to drain urine. A Foley catheter may be inserted if: °· You leak urine or are not able to control when you urinate (urinary incontinence). °· You are not able to urinate when you need to (urinary retention). °· You had prostate surgery or surgery on the genitals. °· You have certain medical conditions, such as multiple sclerosis, dementia, or a spinal cord injury. °If you are going home with a Foley catheter in place, follow the instructions below. °TAKING CARE OF THE CATHETER °1. Wash your hands with soap and water. °2. Using mild soap and warm water on a clean washcloth: °¨ Clean the area on your body closest to the catheter insertion site using a circular motion, moving away from the catheter. Never wipe toward the catheter because this could sweep bacteria up into the urethra and cause infection. °¨ Remove all traces of soap. Pat the area dry with a clean towel. For males, reposition the foreskin. °3. Attach the catheter to your leg so there is no tension on the catheter. Use adhesive tape or a leg strap. If you are using adhesive tape, remove any sticky residue left behind by the previous tape you used. °4. Keep the drainage bag below the level of the bladder, but keep it off the floor. °5. Check throughout the day to be sure the catheter is working and urine is draining freely. Make sure the tubing does not become kinked. °6. Do not pull on the catheter or try to remove it. Pulling could damage internal tissues. °TAKING CARE OF THE DRAINAGE BAGS °You will be given two drainage bags to take home. One is a large overnight drainage bag, and the other is a smaller leg bag that fits underneath clothing. You may wear the overnight bag at any time, but you should never wear the smaller leg bag at night. Follow the instructions below for how to empty, change, and clean your drainage bags. °Emptying the Drainage  Bag °You must empty your drainage bag when it is  -½ full or at least 2-3 times a day. °1. Wash your hands with soap and water. °2. Keep the drainage bag below your hips, below the level of your bladder. This stops urine from going back into the tubing and into your bladder. °3. Hold the dirty bag over the toilet or a clean container. °4. Open the pour spout at the bottom of the bag and empty the urine into the toilet or container. Do not let the pour spout touch the toilet, container, or any other surface. Doing so can place bacteria on the bag, which can cause an infection. °5. Clean the pour spout with a gauze pad or cotton ball that has rubbing alcohol on it. °6. Close the pour spout. °7. Attach the bag to your leg with adhesive tape or a leg strap. °8. Wash your hands well. °Changing the Drainage Bag °Change your drainage bag once a month or sooner if it starts to smell bad or look dirty. Below are steps to follow when changing the drainage bag. °1. Wash your hands with soap and water. °2. Pinch off the rubber catheter so that urine does not spill out. °3. Disconnect the catheter tube from the drainage tube at the connection valve. Do not let the tubes touch any surface. °4. Clean the end of the catheter tube with an alcohol wipe. Use a different alcohol wipe to clean   the end of the drainage tube. °5. Connect the catheter tube to the drainage tube of the clean drainage bag. °6. Attach the new bag to the leg with adhesive tape or a leg strap. Avoid attaching the new bag too tightly. °7. Wash your hands well. °Cleaning the Drainage Bag °1. Wash your hands with soap and water. °2. Wash the bag in warm, soapy water. °3. Rinse the bag thoroughly with warm water. °4. Fill the bag with a solution of white vinegar and water (1 cup vinegar to 1 qt warm water [.2 L vinegar to 1 L warm water]). Close the bag and soak it for 30 minutes in the solution. °5. Rinse the bag with warm water. °6. Hang the bag to dry with the  pour spout open and hanging downward. °7. Store the clean bag (once it is dry) in a clean plastic bag. °8. Wash your hands well. °PREVENTING INFECTION °· Wash your hands before and after handling your catheter. °· Take showers daily and wash the area where the catheter enters your body. Do not take baths. Replace wet leg straps with dry ones, if this applies. °· Do not use powders, sprays, or lotions on the genital area. Only use creams, lotions, or ointments as directed by your caregiver. °· For females, wipe from front to back after each bowel movement. °· Drink enough fluids to keep your urine clear or pale yellow unless you have a fluid restriction. °· Do not let the drainage bag or tubing touch or lie on the floor. °· Wear cotton underwear to absorb moisture and to keep your skin drier. °SEEK MEDICAL CARE IF:  °· Your urine is cloudy or smells unusually bad. °· Your catheter becomes clogged. °· You are not draining urine into the bag or your bladder feels full. °· Your catheter starts to leak. °SEEK IMMEDIATE MEDICAL CARE IF:  °· You have pain, swelling, redness, or pus where the catheter enters the body. °· You have pain in the abdomen, legs, lower back, or bladder. °· You have a fever. °· You see blood fill the catheter, or your urine is pink or red. °· You have nausea, vomiting, or chills. °· Your catheter gets pulled out. °MAKE SURE YOU:  °· Understand these instructions. °· Will watch your condition. °· Will get help right away if you are not doing well or get worse. °  °This information is not intended to replace advice given to you by your health care provider. Make sure you discuss any questions you have with your health care provider. °  °Document Released: 12/30/2004 Document Revised: 05/16/2013 Document Reviewed: 12/22/2011 °Elsevier Interactive Patient Education ©2016 [employer] ° °

## 2015-11-22 NOTE — Progress Notes (Signed)
Physical Therapy Treatment Patient Details Name: [name] MRN: [id number] DOB: 19-Aug-1945 Today's Date: 11/22/2015    History of Present Illness 70 yo male admitted with abd pain, acute kidney injury. Hx of HTN, CAD, MI, CABG, cardiomyopathy, CKD    PT Comments    Patient was seen in bed upon arrival. No complaints of pain. Patient is I with bed mobility. Performed sit to stand x min guard with VC's for safe UE placement. Emptied foley before ambulating and placed urinal on shelf in bathroom. Gait x 200 ft using a cane. Patient drifts R/L occasionally but does not lose his balance. Stairs x 10 with no rail. Required VC's for safe sequencing. Patient stated "he didn't need to practice stairs," even though he has 3 stairs into his house. Patient plans to D/C home today.   Follow Up Recommendations  Home health PT;Supervision/Assistance - 24 hour     Equipment Recommendations  Cane    Recommendations for Other Services       Precautions / Restrictions Precautions Precautions: Fall Precaution Comments: wear shoes Restrictions Weight Bearing Restrictions: No    Mobility  Bed Mobility Overal bed mobility: Independent                Transfers Overall transfer level: Needs assistance Equipment used: Straight cane   Sit to Stand: Min guard         General transfer comment: VC's for safe UE placement.   Ambulation/Gait Ambulation/Gait assistance: Min guard Ambulation Distance (Feet): 200 Feet Assistive device: Straight cane Gait Pattern/deviations: Step-through pattern;Drifts right/left Gait velocity: decreaed Gait velocity interpretation: Below normal speed for age/gender General Gait Details: occassionally drifts R/L, but is able to maintain balance.    Stairs Stairs: Yes Stairs assistance: Min assist Stair Management: One rail Right Number of Stairs: 10 General stair comments: VC's for safe sequencing and to go up one foot at a time instead of  alternating to improve safety awareness. Pt. kept saying "I don't need to practice stairs." Even though he has 3 to get into house.   Wheelchair Mobility    Modified Rankin (Stroke Patients Only)       Balance                                    Cognition Arousal/Alertness: Awake/alert Behavior During Therapy: WFL for tasks assessed/performed Overall Cognitive Status: Within Functional Limits for tasks assessed                      Exercises      General Comments        Pertinent Vitals/Pain Pain Assessment: No/denies pain    Home Living                      Prior Function            PT Goals (current goals can now be found in the care plan section) Progress towards PT goals: Progressing toward goals    Frequency    Min 3X/week      PT Plan Current plan remains appropriate    Co-evaluation             End of Session Equipment Utilized During Treatment: Gait belt Activity Tolerance: Patient tolerated treatment well Patient left: in bed;with call bell/phone within reach;with bed alarm set     Time:  - 10:35 - 10:51  Charges:     1 gt                   G CodesHall Busing, SPTA [facility] [phone]  Present and agree with above  Rica Koyanagi  PTA [facility]  Acute  Rehab Pager      [phone]

## 2015-11-22 NOTE — Progress Notes (Signed)
  Pt without complaints.   O: Vitals:   11/22/15 0622 11/22/15 0839  BP: (!) 127/92 131/73  Pulse: 67 70  Resp: 18   Temp: 98.3 F (36.8 C)     Intake/Output Summary (Last 24 hours) at 11/22/15 0920 Last data filed at 11/22/15 0759  Gross per 24 hour  Intake          3253.75 ml  Output             6051 ml  Net         -2797.25 ml   NAD Urine clear   A/p_ 1)ARF on CKD - improving - good UOP.  2)Hydronephrosis - I'll arrange repeat renal US in office in 2-3 weeks  3)Urinary retention - I'll arrange cystoscopy in office with void trial in 2-3 weeks. Pt may need urodynamics.

## 2015-11-22 NOTE — Consult Note (Signed)
   [facility] CM Inpatient Consult   11/22/2015  [name] [birth year] [id number]   Patient screened for potential [facility] Care Management services. Spoke with [name] at bedside. He denies having any [facility] Care Management needs. Contact information left at bedside to contact in future should he need [facility] Care Management program services. Made inpatient RNCM aware.    Marthenia Rolling, MSN-Ed, RN,BSN [facility] Liaison [phone]

## 2015-11-22 NOTE — Discharge Summary (Signed)
Physician Discharge Summary  [name] YNW:[id number] DOB: Aug 18, 1945 DOA: 11/17/2015  PCP: Larence Penning [place]  Admit date: 11/17/2015 Discharge date: 11/22/2015  Admitted From: Home  Disposition:  Home   Recommendations for Outpatient Follow-up:  1. Follow up with PCP in 1 weeks 2. Please obtain BMP/CBC in one week 3. Follow up with urologist in 2 weeks  Discharge Condition: STABLE CODE STATUS: FULL Diet recommendation: Renal heart healthy  Brief/Interim Summary: HPI: [name] is a 70 y.o. male with medical history significant of hypertension, hyperlipidemia, CAD, MI, s/p of CABG4, status post repair of ventricular septal defect in September 2011, sCHf with EF 45%, CKD stage III, urethral stricture, chronic bilateral hydronephrosis, anemia, presents with abdominal pain.  pt states that he has been having abdominal pain in the past 3 days. His abdominal pain is located in the lower abdomen, constant, 8 out of 10 in severity, pressure-like, nonradiating. He has decreased urine output and difficulty urinating during the same period of time. He has nausea and vomited 3 times without blood in the vomitus. He denies dysuria or burning on urination. He states that sometimes they feel dizzy, but no unilateral weakness, numbness or tingling sensations in extremities. No vision change or hearing loss. Patient has mild shortness of breath on exertion, but no chest pain, tenderness over calf areas. No cough, fever or chills.  Foley cath was placed in ED, and 1400 cc of urine removed. Pt's abdominal pain has improved.  Of note, patient was admitted from 01/24/15-01/30/15 due to bladder outlet obstruction induced acute renal injury, which resolved after Foley catheter was placed by urologist.  ED Course: pt was found to have WBC 6.6, hemoglobin 7.8 which was 10.2 on 04/03/15, lipase 80, positive urinalysis with large amount of leukocyte, worsening renal function with creatinine up from 2.14 on 03/27/15-->  9.41 and BUN 100, potassium 5.8 without EKG change, CABG are normal, no tachycardia, O2 stat 100% on room air. Patient is admitted to telemetry bed as inpatient.  # CT-per renal stone protocol showed relatively severe chronic bilateral hydronephrosis noted, significantly worsened from the prior study, with underlying mild renal atrophy. Hydronephrosis is somewhat worse on the right. This may reflect chronic distal obstruction due to previously described  urethral stricture. Diffuse bladder wall thickening again noted, with underlying enlarged prostate, 1.2 cm hyperdense left renal cyst noted.  Assessment/Plan:    1. Acute kidney injury due to bladder outlet obstruction- creatinine slowly improving today creatinine is 3.55 down from 4 yesterday. Urology has seen the patient and recommend continued Foley catheter. planning for possible discharge 11/9. Patient's baseline creatinine is around 2.0. Discontinued tamsulosin as per urology recommendation. Patient will follow-up with urology Dr. Junious Silk in 2 weeks after discharge. Follow BMP. 2. Hyperkalemia- treated with lasix and kayexalate with repeat BMP recommended with PCP in 1 week. 3. UTI- patient was empirically started on ceftriaxone, urine culture growing random multiple species. Will discontinue ceftriaxone.  4. Anemia of chronic disease- patient's baseline hemoglobin around 9-10, when admitted hemoglobin was 7.8 suspect that this was from dilution from IV fluids. 5. Hypertension- continue Coreg, Amlodipine. Blood pressure stable and controlled. 6. CAD- stable, continue aspirin. 7. Chronic systolic CHF- well compensated at this time no shortness of breath.  DVT prophylaxis: SCDs, will avoid heparin due to significant hematuria  Code Status: Full code  Family Communication: No family present at bedside   Disposition Plan: Home with HHPT, possibly 11/9.   Consultants:  Urology  Procedures:  None  Discharge  Diagnoses:   Principal Problem:   Abdominal pain Active Problems:   HYPERTENSION, BENIGN   CAD, AUTOLOGOUS BYPASS GRAFT   Chronic systolic CHF (congestive heart failure) (HCC)   Hyperlipidemia   Acute renal failure superimposed on stage 3 chronic kidney disease (HCC)   Hyperkalemia   UTI (urinary tract infection)   Microcytic anemia   Acute renal failure (ARF) (HCC)   Hydronephrosis   Urethral stricture  Discharge Instructions  Discharge Instructions    Increase activity slowly    Complete by:  As directed        Medication List    STOP taking these medications   tamsulosin 0.4 MG Caps capsule Commonly known as:  FLOMAX     TAKE these medications   amLODipine 10 MG tablet Commonly known as:  NORVASC Take 0.5 tablets (5 mg total) by mouth daily. What changed:  how much to take   aspirin EC 81 MG tablet Take 1 tablet (81 mg total) by mouth daily.   carvedilol 12.5 MG tablet Commonly known as:  COREG take 1 tablet by mouth twice a day with meals What changed:  See the new instructions.   feeding supplement (PRO-STAT SUGAR FREE 64) Liqd Take 30 mLs by mouth 2 (two) times daily.   rosuvastatin 40 MG tablet Commonly known as:  CRESTOR take 1 tablet by mouth once daily What changed:  See the new instructions.            Durable Medical Equipment        Start     Ordered   11/21/15 1100  For home use only DME Cane  Once    Comments:  Single point   11/21/15 1059     Follow-up Information    ESKRIDGE, MATTHEW, MD Follow up.   Specialty:  Urology Why:  2-3 weeks  Contact information: [facility] [address] [address] Follow up.   Why:  [place] [facility] Contact information: [place] [address] [phone]        Inc[facility] - [facility] Follow up.   Why:  cane Contact information: [place] [address] [address]. Schedule an appointment as soon as possible for a visit in 1 week(s).   Why:  Hospital Follow Up check labs Contact information: [place] [address] [phone]         Allergies  Allergen Reactions  . Chlorhexidine Gluconate Other (See Comments)    Red skin and flaked skin     Procedures/Studies: Ct Renal Stone Study  Result Date: 11/17/2015 CLINICAL DATA:  Acute onset of lower abdominal pain, nausea and vomiting. Initial encounter. EXAM: CT ABDOMEN AND PELVIS WITHOUT CONTRAST TECHNIQUE: Multidetector CT imaging of the abdomen and pelvis was performed following the standard protocol without IV contrast. COMPARISON:  CT of the abdomen and pelvis performed 01/26/2015 FINDINGS: Lower chest: Minimal left basilar atelectasis is noted. The patient is status post median sternotomy. The patient is status post remote left ventricular myocardial infarction, with associated wall calcification. Hepatobiliary: The liver is unremarkable in appearance. The gallbladder is unremarkable in appearance. The common bile duct remains normal in caliber. Pancreas: The pancreas is within normal limits. Spleen: The spleen is unremarkable in appearance. Adrenals/Urinary Tract: The adrenal glands are unremarkable in appearance. Relatively severe chronic  bilateral hydronephrosis is noted, with underlying mild renal atrophy. Hydronephrosis is somewhat worse on the right. Bilateral perinephric stranding is seen. A 1.2 cm hyperdense cyst is noted at the interpole region of the left kidney. No renal or ureteral stones are identified. Stomach/Bowel: The stomach is unremarkable in appearance. The small bowel is within normal limits. The appendix is normal in caliber, without evidence of appendicitis. The colon is unremarkable in appearance. Vascular/Lymphatic: Scattered calcification is seen along the abdominal aorta and its branches. The abdominal aorta is  otherwise grossly unremarkable. The inferior vena cava is grossly unremarkable. No retroperitoneal lymphadenopathy is seen. No pelvic sidewall lymphadenopathy is identified. Reproductive: The prostate is enlarged, measuring 5.3 cm in AP dimension. Diffuse bladder wall thickening likely reflects underlying chronic inflammation. Severe bilateral hydronephrosis may reflect chronic distal obstruction due to previously described urethral stricture. Other: No additional soft tissue abnormalities are seen. Musculoskeletal: No acute osseous abnormalities are identified. The visualized musculature is unremarkable in appearance. IMPRESSION: 1. No acute abnormality seen to explain the patient's symptoms. 2. Relatively severe chronic bilateral hydronephrosis noted, significantly worsened from the prior study, with underlying mild renal atrophy. Hydronephrosis is somewhat worse on the right. This may reflect chronic distal obstruction due to previously described urethral stricture. Diffuse bladder wall thickening again noted, with underlying enlarged prostate. 3. Status post remote left ventricular myocardial infarction, with associated mild wall calcification. 4. 1.2 cm hyperdense left renal cyst noted. 5. Scattered aortic atherosclerosis. Electronically Signed   By: Garald Balding M.D.   On: 11/17/2015 02:57    (Echo, Carotid, EGD, Colonoscopy, ERCP)    Subjective: Pt without complaints.    Discharge Exam: Vitals:   11/22/15 0622 11/22/15 0839  BP: (!) 127/92 131/73  Pulse: 67 70  Resp: 18   Temp: 98.3 F (36.8 C)    Vitals:   11/21/15 1300 11/21/15 2333 11/22/15 0622 11/22/15 0839  BP: 128/76 123/82 (!) 127/92 131/73  Pulse: 62 66 67 70  Resp: 18 18 18    Temp: 97.8 F (36.6 C) 97.4 F (36.3 C) 98.3 F (36.8 C)   TempSrc: Oral Oral Oral   SpO2: 100% 100% 100%   Weight:   75.1 kg (165 lb 8 oz)   Height:        General: Pt is alert, awake, not in acute distress Cardiovascular: RRR, S1/S2 +, no  rubs, no gallops Respiratory: CTA bilaterally, no wheezing, no rhonchi Abdominal: Soft, NT, ND, bowel sounds + Extremities: no edema, no cyanosis   The results of significant diagnostics from this hospitalization (including imaging, microbiology, ancillary and laboratory) are listed below for reference.     Microbiology: Recent Results (from the past 240 hour(s))  Urine culture     Status: Abnormal   Collection Time: 11/17/15 12:48 AM  Result Value Ref Range Status   Specimen Description URINE, RANDOM  Final   Special Requests NONE  Final   Culture MULTIPLE SPECIES PRESENT, SUGGEST RECOLLECTION (A)  Final   Report Status 11/18/2015 FINAL  Final     Labs: BNP (last 3 results)  Recent Labs  11/17/15 0352  BNP 235.3*   Basic Metabolic Panel:  Recent Labs Lab 11/19/15 0452 11/20/15 0452 11/20/15 1338 11/21/15 0444 11/22/15 0439  NA 140 139 133* 136 135  K 4.9 4.7 4.4 4.8 5.2*  CL 117* 116* 113* 115* 113*  CO2 14* 16* 15* 15* 16*  GLUCOSE 106* 102* 103* 93 92  BUN 71* 65* 64* 63* 64*  CREATININE 6.46* 4.96* 4.55*  4.08* 3.77*  CALCIUM 9.3 8.9 8.4* 8.7* 9.0   Liver Function Tests:  Recent Labs Lab 11/17/15 0053 11/18/15 0606 11/20/15 1338  AST 9* 12* 15  ALT 8* 6* 14*  ALKPHOS 64 56 62  BILITOT 0.5 0.5 0.2*  PROT 8.9* 7.9 7.6  ALBUMIN 3.5 2.8* 2.8*    Recent Labs Lab 11/17/15 0053  LIPASE 80*   No results for input(s): AMMONIA in the last 168 hours. CBC:  Recent Labs Lab 11/17/15 0352 11/18/15 0606 11/19/15 0452 11/21/15 0444 11/22/15 0439  WBC 6.8 8.0 7.3 9.2 9.0  HGB 7.7* 7.3* 7.5* 7.3* 7.4*  HCT 23.7* 22.7* 23.6* 22.6* 22.8*  MCV 77.7* 78.5 78.4 78.7 78.6  PLT 412* 386 380 373 376   Cardiac Enzymes: No results for input(s): CKTOTAL, CKMB, CKMBINDEX, TROPONINI in the last 168 hours. BNP: Invalid input(s): POCBNP CBG:  Recent Labs Lab 11/19/15 0759 11/20/15 0724 11/21/15 0736 11/22/15 0752 11/22/15 1132  GLUCAP 112* 84 94 158*  117*   D-Dimer No results for input(s): DDIMER in the last 72 hours. Hgb A1c No results for input(s): HGBA1C in the last 72 hours. Lipid Profile No results for input(s): CHOL, HDL, LDLCALC, TRIG, CHOLHDL, LDLDIRECT in the last 72 hours. Thyroid function studies No results for input(s): TSH, T4TOTAL, T3FREE, THYROIDAB in the last 72 hours.  Invalid input(s): FREET3 Anemia work up No results for input(s): VITAMINB12, FOLATE, FERRITIN, TIBC, IRON, RETICCTPCT in the last 72 hours. Urinalysis    Component Value Date/Time   COLORURINE YELLOW 11/17/2015 0048   APPEARANCEUR TURBID (A) 11/17/2015 0048   LABSPEC 1.010 11/17/2015 0048   PHURINE 7.5 11/17/2015 0048   GLUCOSEU NEGATIVE 11/17/2015 0048   HGBUR LARGE (A) 11/17/2015 0048   BILIRUBINUR NEGATIVE 11/17/2015 0048   KETONESUR NEGATIVE 11/17/2015 0048   PROTEINUR 100 (A) 11/17/2015 0048   UROBILINOGEN 0.2 08/29/2010 1726   NITRITE NEGATIVE 11/17/2015 0048   LEUKOCYTESUR LARGE (A) 11/17/2015 0048   Sepsis Labs Invalid input(s): PROCALCITONIN,  WBC,  LACTICIDVEN Microbiology Recent Results (from the past 240 hour(s))  Urine culture     Status: Abnormal   Collection Time: 11/17/15 12:48 AM  Result Value Ref Range Status   Specimen Description URINE, RANDOM  Final   Special Requests NONE  Final   Culture MULTIPLE SPECIES PRESENT, SUGGEST RECOLLECTION (A)  Final   Report Status 11/18/2015 FINAL  Final   Time coordinating discharge: 31 mins  SIGNED:  Irwin Brakeman, MD  [facility] 11/22/2015, 12:57 PM Pager   If 7PM-7AM, please contact night-coverage [email or web address] Password [email or web address]

## 2015-11-22 NOTE — Progress Notes (Signed)
Pt given discharge instructions and all questions answered. Pt sent home with multiple leg bags to connect to foley catheter. Son will be transporting patient home.

## 2015-11-23 DIAGNOSIS — Z87440 Personal history of urinary (tract) infections: Secondary | ICD-10-CM | POA: Diagnosis not present

## 2015-11-23 DIAGNOSIS — I252 Old myocardial infarction: Secondary | ICD-10-CM | POA: Diagnosis not present

## 2015-11-23 DIAGNOSIS — D539 Nutritional anemia, unspecified: Secondary | ICD-10-CM | POA: Diagnosis not present

## 2015-11-23 DIAGNOSIS — N320 Bladder-neck obstruction: Secondary | ICD-10-CM | POA: Diagnosis not present

## 2015-11-23 DIAGNOSIS — Z87891 Personal history of nicotine dependence: Secondary | ICD-10-CM | POA: Diagnosis not present

## 2015-11-23 DIAGNOSIS — E785 Hyperlipidemia, unspecified: Secondary | ICD-10-CM | POA: Diagnosis not present

## 2015-11-23 DIAGNOSIS — Z951 Presence of aortocoronary bypass graft: Secondary | ICD-10-CM | POA: Diagnosis not present

## 2015-11-23 DIAGNOSIS — I130 Hypertensive heart and chronic kidney disease with heart failure and stage 1 through stage 4 chronic kidney disease, or unspecified chronic kidney disease: Secondary | ICD-10-CM | POA: Diagnosis not present

## 2015-11-23 DIAGNOSIS — Z7982 Long term (current) use of aspirin: Secondary | ICD-10-CM | POA: Diagnosis not present

## 2015-11-23 DIAGNOSIS — I2510 Atherosclerotic heart disease of native coronary artery without angina pectoris: Secondary | ICD-10-CM | POA: Diagnosis not present

## 2015-11-23 DIAGNOSIS — I5022 Chronic systolic (congestive) heart failure: Secondary | ICD-10-CM | POA: Diagnosis not present

## 2015-11-23 DIAGNOSIS — N183 Chronic kidney disease, stage 3 (moderate): Secondary | ICD-10-CM | POA: Diagnosis not present

## 2015-11-30 ENCOUNTER — Inpatient Hospital Stay: Payer: Self-pay

## 2015-12-05 ENCOUNTER — Ambulatory Visit: Payer: Medicare Other | Attending: Internal Medicine | Admitting: Physician Assistant

## 2015-12-05 ENCOUNTER — Encounter: Payer: Self-pay | Admitting: Physician Assistant

## 2015-12-05 VITALS — BP 117/74 | HR 79 | Temp 97.8°F | Resp 16 | Wt 176.4 lb

## 2015-12-05 DIAGNOSIS — Z7982 Long term (current) use of aspirin: Secondary | ICD-10-CM | POA: Insufficient documentation

## 2015-12-05 DIAGNOSIS — R339 Retention of urine, unspecified: Secondary | ICD-10-CM | POA: Diagnosis not present

## 2015-12-05 DIAGNOSIS — D649 Anemia, unspecified: Secondary | ICD-10-CM | POA: Diagnosis not present

## 2015-12-05 DIAGNOSIS — N3000 Acute cystitis without hematuria: Secondary | ICD-10-CM | POA: Diagnosis not present

## 2015-12-05 DIAGNOSIS — Z951 Presence of aortocoronary bypass graft: Secondary | ICD-10-CM | POA: Insufficient documentation

## 2015-12-05 DIAGNOSIS — I5022 Chronic systolic (congestive) heart failure: Secondary | ICD-10-CM | POA: Insufficient documentation

## 2015-12-05 DIAGNOSIS — I130 Hypertensive heart and chronic kidney disease with heart failure and stage 1 through stage 4 chronic kidney disease, or unspecified chronic kidney disease: Secondary | ICD-10-CM | POA: Insufficient documentation

## 2015-12-05 DIAGNOSIS — I2510 Atherosclerotic heart disease of native coronary artery without angina pectoris: Secondary | ICD-10-CM | POA: Diagnosis not present

## 2015-12-05 DIAGNOSIS — N1330 Unspecified hydronephrosis: Secondary | ICD-10-CM | POA: Insufficient documentation

## 2015-12-05 DIAGNOSIS — N179 Acute kidney failure, unspecified: Secondary | ICD-10-CM | POA: Insufficient documentation

## 2015-12-05 DIAGNOSIS — N183 Chronic kidney disease, stage 3 (moderate): Secondary | ICD-10-CM | POA: Diagnosis not present

## 2015-12-05 DIAGNOSIS — N320 Bladder-neck obstruction: Secondary | ICD-10-CM | POA: Insufficient documentation

## 2015-12-05 DIAGNOSIS — N400 Enlarged prostate without lower urinary tract symptoms: Secondary | ICD-10-CM | POA: Diagnosis not present

## 2015-12-05 DIAGNOSIS — E785 Hyperlipidemia, unspecified: Secondary | ICD-10-CM | POA: Diagnosis not present

## 2015-12-05 DIAGNOSIS — I10 Essential (primary) hypertension: Secondary | ICD-10-CM | POA: Insufficient documentation

## 2015-12-05 DIAGNOSIS — E875 Hyperkalemia: Secondary | ICD-10-CM | POA: Diagnosis not present

## 2015-12-05 LAB — BASIC METABOLIC PANEL
BUN: 39 mg/dL — AB (ref 7–25)
CHLORIDE: 113 mmol/L — AB (ref 98–110)
CO2: 19 mmol/L — AB (ref 20–31)
CREATININE: 2.91 mg/dL — AB (ref 0.70–1.18)
Calcium: 9.4 mg/dL (ref 8.6–10.3)
GLUCOSE: 87 mg/dL (ref 65–99)
Potassium: 5.1 mmol/L (ref 3.5–5.3)
Sodium: 136 mmol/L (ref 135–146)

## 2015-12-05 LAB — CBC WITH DIFFERENTIAL/PLATELET
BASOS PCT: 0 %
Basophils Absolute: 0 cells/uL (ref 0–200)
EOS ABS: 198 {cells}/uL (ref 15–500)
Eosinophils Relative: 3 %
HEMATOCRIT: 26.2 % — AB (ref 38.5–50.0)
Hemoglobin: 8 g/dL — ABNORMAL LOW (ref 13.2–17.1)
LYMPHS PCT: 47 %
Lymphs Abs: 3102 cells/uL (ref 850–3900)
MCH: 25.1 pg — ABNORMAL LOW (ref 27.0–33.0)
MCHC: 30.5 g/dL — ABNORMAL LOW (ref 32.0–36.0)
MCV: 82.1 fL (ref 80.0–100.0)
MONO ABS: 462 {cells}/uL (ref 200–950)
MPV: 8.3 fL (ref 7.5–12.5)
Monocytes Relative: 7 %
Neutro Abs: 2838 cells/uL (ref 1500–7800)
Neutrophils Relative %: 43 %
Platelets: 396 10*3/uL (ref 140–400)
RBC: 3.19 MIL/uL — ABNORMAL LOW (ref 4.20–5.80)
RDW: 18.2 % — AB (ref 11.0–15.0)
WBC: 6.6 10*3/uL (ref 3.8–10.8)

## 2015-12-05 NOTE — Progress Notes (Signed)
Pt is in the office today for a hospital follow up Pt states he is not having any pain

## 2015-12-05 NOTE — Progress Notes (Signed)
Chief Complaint: Hospital follow up  Subjective: This is a 70 year old male with a history of hypertension, hyperlipidemia, coronary artery disease status post CABG, VSD with repair, systolic congestive heart failure, chronic kidney disease stage III, and bladder outlet obstruction who was hospitalized from November 4-9, 2017 with complaints of abdominal pain. His pain was 8 on a scale of 1-10 and constant. He noted decreased urinary output. He was vomiting some blood. He was having difficulty urinating. In the emergency department his white blood cell count was okay. His hemoglobin was 7.8. His lipase was 80. His urinalysis was abnormal. His creatinine was 9.4! Potassium 5.8. A CT scan of the abdomen showed chronic bilateral hydronephrosis but worsened especially on the right side. He was admitted by the internal medicine team and underwent aggressive IV fluid hydration, IV antibiotic therapy and insertion of a Foley. He was seen by urology. They recommended discontinuing his ACE inhibitor and discontinuing Flomax. He did not require blood transfusion. His Foley was left in place until his follow-up appointment with urology.  Since discharge he's been feeling much better. He is voiding more regularly and changing out the foley as needed. He has been compliant with the medication adjustments. His breathing has improved. No new complaints today. Has HH to start soon.   ROS:  GEN: denies fever or chills, denies change in weight Skin: denies lesions or rashes HEENT: denies headache, earache, epistaxis, sore throat, or neck pain LUNGS: denies SHOB, dyspnea, PND, orthopnea CV: denies CP or palpitations ABD: denies abd pain, N or V EXT: denies muscle spasms or swelling; no pain in lower ext, no weakness NEURO: denies numbness or tingling, denies sz, stroke or TIA   Objective:  Vitals:   12/05/15 1021  BP: 117/74  Pulse: 79  Resp: 16  Temp: 97.8 F (36.6 C)  TempSrc: Oral  SpO2: 100%  Weight:  176 lb 6.4 oz (80 kg)    Physical Exam:  General: in no acute distress. HEENT: no pallor, no icterus, moist oral mucosa, no JVD, no lymphadenopathy Heart: Normal  s1 &s2  Regular rate and rhythm, without murmurs, rubs, gallops. Lungs: Clear to auscultation bilaterally. Abdomen: Soft, nontender, nondistended, positive bowel sounds. Extremities: No clubbing cyanosis or edema with positive pedal pulses. Neuro: Alert, awake, oriented x3, nonfocal.   Medications: Prior to Admission medications   Medication Sig Start Date End Date Taking? Authorizing Provider  Amino Acids-Protein Hydrolys (FEEDING SUPPLEMENT, PRO-STAT SUGAR FREE 64,) LIQD Take 30 mLs by mouth 2 (two) times daily. 11/22/15   Clanford Marisa Hua, MD  amLODipine (NORVASC) 10 MG tablet Take 0.5 tablets (5 mg total) by mouth daily. Patient taking differently: Take 10 mg by mouth daily.  01/30/15   Nishant Dhungel, MD  aspirin EC 81 MG tablet Take 1 tablet (81 mg total) by mouth daily. 11/06/14   Lelon Perla, MD  carvedilol (COREG) 12.5 MG tablet take 1 tablet by mouth twice a day with meals Patient taking differently: Take 12.5 mg by mouth twice daily with meals. 11/30/14   Lelon Perla, MD  rosuvastatin (CRESTOR) 40 MG tablet take 1 tablet by mouth once daily Patient taking differently: Take 40 mg by mouth once daily 11/30/14   Lelon Perla, MD    Assessment: 1. Acute urinary retention 2. Acute kidney injury on CKD stage 3/Hyperkalemia 3. Acute cystitis 4. Acute on chronic Anemia  Plan: Urology appt next week as scheduled Junious Silk, MD) BMP and CBC in 1-2 weeks Restart GDMT when CR allows  Cont HHPT  Follow up:2 weeks with Dr. Lindell Noe  The patient was given clear instructions to go to ER or return to medical center if symptoms don't improve, worsen or new problems develop. The patient verbalized understanding. The patient was told to call to get lab results if they haven't heard anything in the next week.    This note has been created with Surveyor, quantity. Any transcriptional errors are unintentional.   Zettie Pho, PA-C 12/05/2015, 12:24 PM

## 2015-12-10 DIAGNOSIS — Z87440 Personal history of urinary (tract) infections: Secondary | ICD-10-CM | POA: Diagnosis not present

## 2015-12-10 DIAGNOSIS — D539 Nutritional anemia, unspecified: Secondary | ICD-10-CM | POA: Diagnosis not present

## 2015-12-10 DIAGNOSIS — I5022 Chronic systolic (congestive) heart failure: Secondary | ICD-10-CM | POA: Diagnosis not present

## 2015-12-10 DIAGNOSIS — I2510 Atherosclerotic heart disease of native coronary artery without angina pectoris: Secondary | ICD-10-CM | POA: Diagnosis not present

## 2015-12-10 DIAGNOSIS — I130 Hypertensive heart and chronic kidney disease with heart failure and stage 1 through stage 4 chronic kidney disease, or unspecified chronic kidney disease: Secondary | ICD-10-CM | POA: Diagnosis not present

## 2015-12-10 DIAGNOSIS — N183 Chronic kidney disease, stage 3 (moderate): Secondary | ICD-10-CM | POA: Diagnosis not present

## 2015-12-11 DIAGNOSIS — I130 Hypertensive heart and chronic kidney disease with heart failure and stage 1 through stage 4 chronic kidney disease, or unspecified chronic kidney disease: Secondary | ICD-10-CM | POA: Diagnosis not present

## 2015-12-11 DIAGNOSIS — I5022 Chronic systolic (congestive) heart failure: Secondary | ICD-10-CM | POA: Diagnosis not present

## 2015-12-11 DIAGNOSIS — N183 Chronic kidney disease, stage 3 (moderate): Secondary | ICD-10-CM | POA: Diagnosis not present

## 2015-12-11 DIAGNOSIS — D539 Nutritional anemia, unspecified: Secondary | ICD-10-CM | POA: Diagnosis not present

## 2015-12-11 DIAGNOSIS — I2510 Atherosclerotic heart disease of native coronary artery without angina pectoris: Secondary | ICD-10-CM | POA: Diagnosis not present

## 2015-12-11 DIAGNOSIS — Z87440 Personal history of urinary (tract) infections: Secondary | ICD-10-CM | POA: Diagnosis not present

## 2015-12-18 DIAGNOSIS — I2510 Atherosclerotic heart disease of native coronary artery without angina pectoris: Secondary | ICD-10-CM | POA: Diagnosis not present

## 2015-12-18 DIAGNOSIS — I130 Hypertensive heart and chronic kidney disease with heart failure and stage 1 through stage 4 chronic kidney disease, or unspecified chronic kidney disease: Secondary | ICD-10-CM | POA: Diagnosis not present

## 2015-12-18 DIAGNOSIS — N183 Chronic kidney disease, stage 3 (moderate): Secondary | ICD-10-CM | POA: Diagnosis not present

## 2015-12-18 DIAGNOSIS — I5022 Chronic systolic (congestive) heart failure: Secondary | ICD-10-CM | POA: Diagnosis not present

## 2015-12-18 DIAGNOSIS — Z87440 Personal history of urinary (tract) infections: Secondary | ICD-10-CM | POA: Diagnosis not present

## 2015-12-18 DIAGNOSIS — D539 Nutritional anemia, unspecified: Secondary | ICD-10-CM | POA: Diagnosis not present

## 2015-12-19 ENCOUNTER — Ambulatory Visit: Payer: Self-pay | Admitting: Family Medicine

## 2015-12-20 DIAGNOSIS — I130 Hypertensive heart and chronic kidney disease with heart failure and stage 1 through stage 4 chronic kidney disease, or unspecified chronic kidney disease: Secondary | ICD-10-CM | POA: Diagnosis not present

## 2015-12-20 DIAGNOSIS — D539 Nutritional anemia, unspecified: Secondary | ICD-10-CM | POA: Diagnosis not present

## 2015-12-20 DIAGNOSIS — Z87440 Personal history of urinary (tract) infections: Secondary | ICD-10-CM | POA: Diagnosis not present

## 2015-12-20 DIAGNOSIS — N183 Chronic kidney disease, stage 3 (moderate): Secondary | ICD-10-CM | POA: Diagnosis not present

## 2015-12-20 DIAGNOSIS — I5022 Chronic systolic (congestive) heart failure: Secondary | ICD-10-CM | POA: Diagnosis not present

## 2015-12-20 DIAGNOSIS — I2510 Atherosclerotic heart disease of native coronary artery without angina pectoris: Secondary | ICD-10-CM | POA: Diagnosis not present

## 2015-12-25 ENCOUNTER — Other Ambulatory Visit: Payer: Self-pay | Admitting: Cardiology

## 2015-12-27 DIAGNOSIS — Z87440 Personal history of urinary (tract) infections: Secondary | ICD-10-CM | POA: Diagnosis not present

## 2015-12-27 DIAGNOSIS — D539 Nutritional anemia, unspecified: Secondary | ICD-10-CM | POA: Diagnosis not present

## 2015-12-27 DIAGNOSIS — I2510 Atherosclerotic heart disease of native coronary artery without angina pectoris: Secondary | ICD-10-CM | POA: Diagnosis not present

## 2015-12-27 DIAGNOSIS — I130 Hypertensive heart and chronic kidney disease with heart failure and stage 1 through stage 4 chronic kidney disease, or unspecified chronic kidney disease: Secondary | ICD-10-CM | POA: Diagnosis not present

## 2015-12-27 DIAGNOSIS — I5022 Chronic systolic (congestive) heart failure: Secondary | ICD-10-CM | POA: Diagnosis not present

## 2015-12-27 DIAGNOSIS — N183 Chronic kidney disease, stage 3 (moderate): Secondary | ICD-10-CM | POA: Diagnosis not present

## 2015-12-31 DIAGNOSIS — D539 Nutritional anemia, unspecified: Secondary | ICD-10-CM | POA: Diagnosis not present

## 2015-12-31 DIAGNOSIS — I5022 Chronic systolic (congestive) heart failure: Secondary | ICD-10-CM | POA: Diagnosis not present

## 2015-12-31 DIAGNOSIS — Z87440 Personal history of urinary (tract) infections: Secondary | ICD-10-CM | POA: Diagnosis not present

## 2015-12-31 DIAGNOSIS — I130 Hypertensive heart and chronic kidney disease with heart failure and stage 1 through stage 4 chronic kidney disease, or unspecified chronic kidney disease: Secondary | ICD-10-CM | POA: Diagnosis not present

## 2015-12-31 DIAGNOSIS — N183 Chronic kidney disease, stage 3 (moderate): Secondary | ICD-10-CM | POA: Diagnosis not present

## 2015-12-31 DIAGNOSIS — I2510 Atherosclerotic heart disease of native coronary artery without angina pectoris: Secondary | ICD-10-CM | POA: Diagnosis not present

## 2016-01-02 DIAGNOSIS — I5022 Chronic systolic (congestive) heart failure: Secondary | ICD-10-CM | POA: Diagnosis not present

## 2016-01-02 DIAGNOSIS — Z87440 Personal history of urinary (tract) infections: Secondary | ICD-10-CM | POA: Diagnosis not present

## 2016-01-02 DIAGNOSIS — I2510 Atherosclerotic heart disease of native coronary artery without angina pectoris: Secondary | ICD-10-CM | POA: Diagnosis not present

## 2016-01-02 DIAGNOSIS — N183 Chronic kidney disease, stage 3 (moderate): Secondary | ICD-10-CM | POA: Diagnosis not present

## 2016-01-02 DIAGNOSIS — I130 Hypertensive heart and chronic kidney disease with heart failure and stage 1 through stage 4 chronic kidney disease, or unspecified chronic kidney disease: Secondary | ICD-10-CM | POA: Diagnosis not present

## 2016-01-02 DIAGNOSIS — D539 Nutritional anemia, unspecified: Secondary | ICD-10-CM | POA: Diagnosis not present

## 2016-01-28 ENCOUNTER — Ambulatory Visit: Payer: Self-pay | Admitting: Family Medicine

## 2016-02-04 ENCOUNTER — Other Ambulatory Visit: Payer: Self-pay | Admitting: Cardiology

## 2016-02-08 ENCOUNTER — Ambulatory Visit: Payer: Self-pay | Admitting: Family Medicine

## 2016-02-18 ENCOUNTER — Ambulatory Visit: Payer: Self-pay | Admitting: Family Medicine

## 2016-03-03 IMAGING — US US RENAL
1 series · 14 of 25 positions shown · non-contrast
Comparison: Ultrasound 08/30/2010.  CT 08/29/2010.

CLINICAL DATA: Acute renal failure. History of chronic
hydronephrosis. Previous cystoscopy.

EXAM:
RENAL / URINARY TRACT ULTRASOUND COMPLETE

[Series 1: us renal · 0.25mm/px · 14 of 53 slices shown]
[im 1/53]
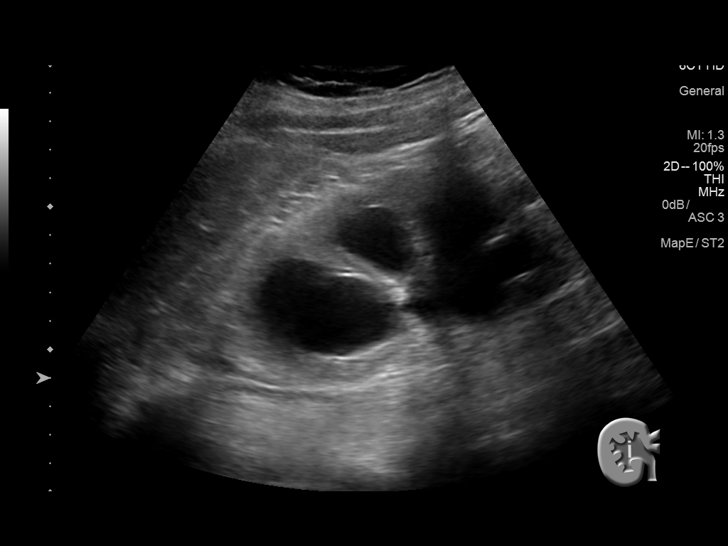
[im 5/53]
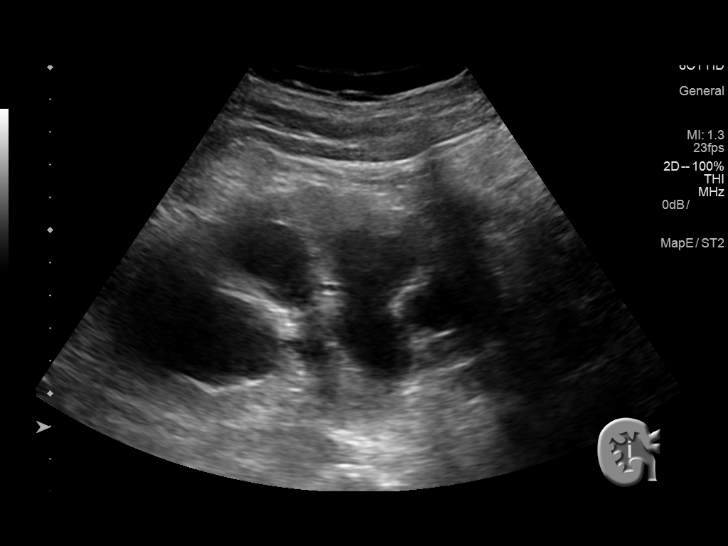
[im 9/53]
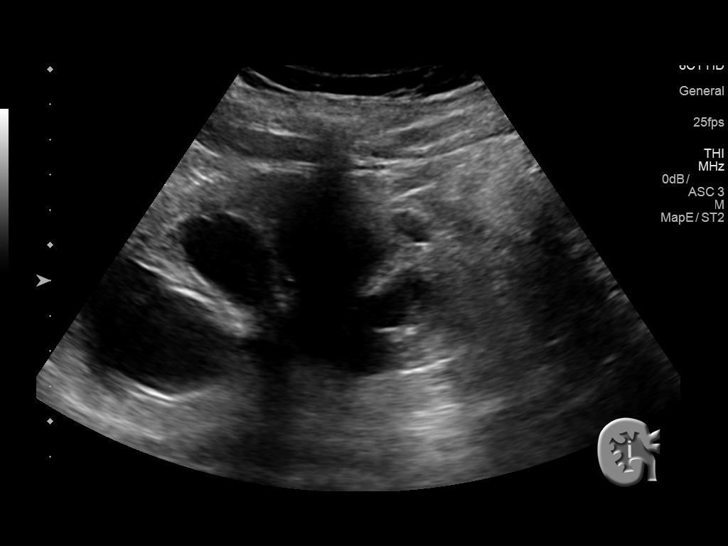
[im 14/53]
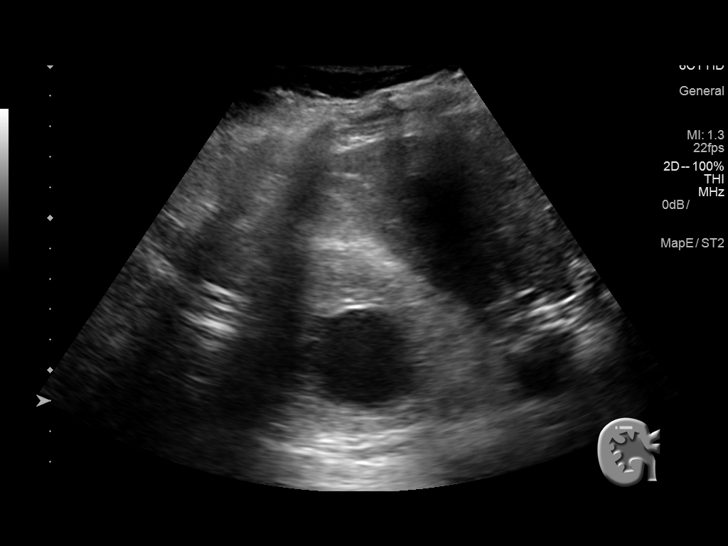
[im 18/53]
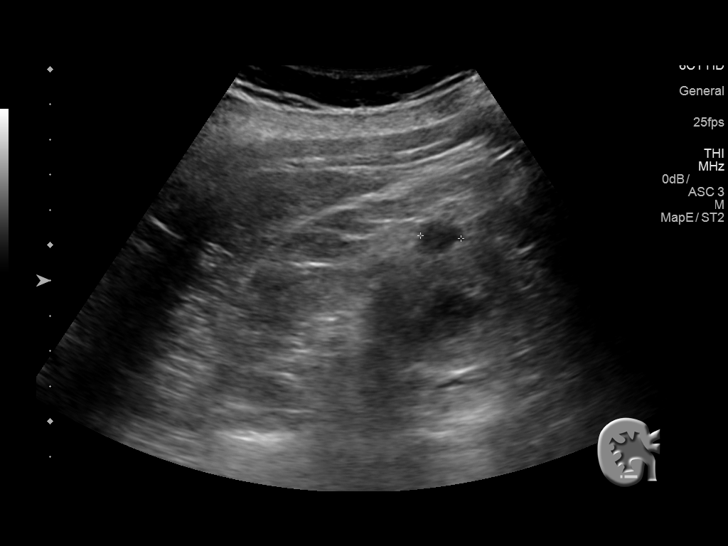
[im 20/53]
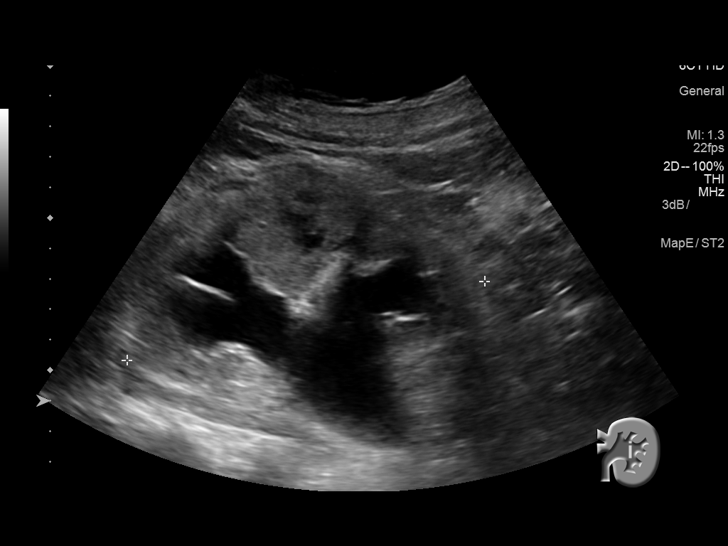
[im 24/53]
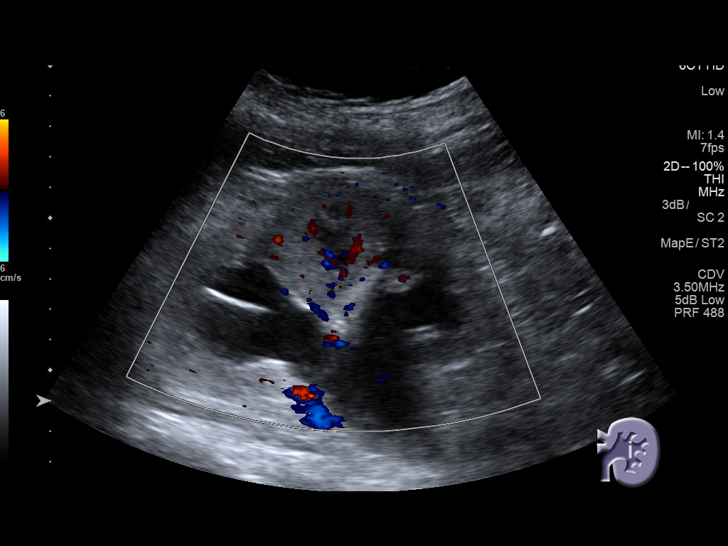
[im 29/53]
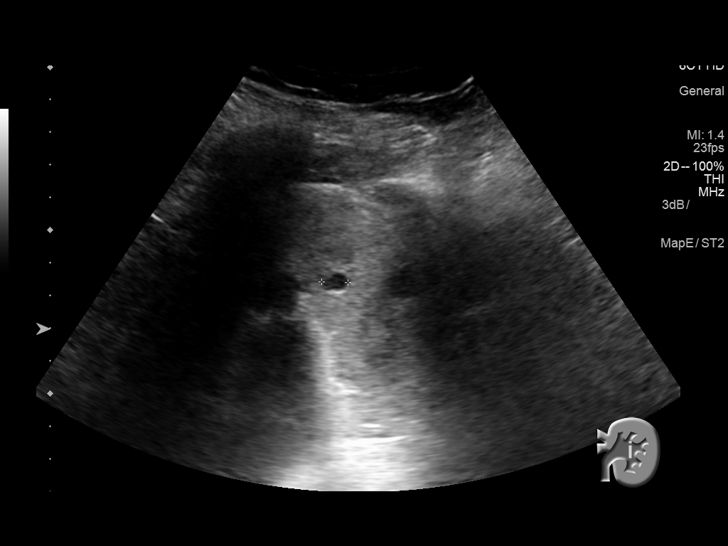
[im 33/53]
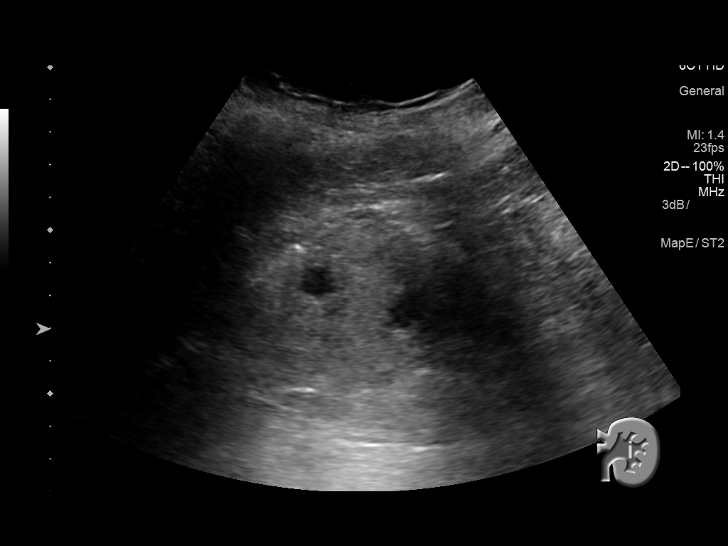
[im 35/53]
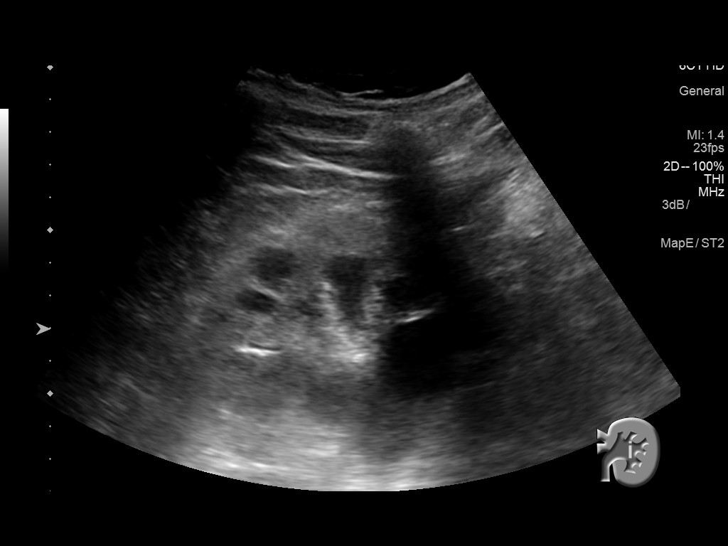
[im 40/53]
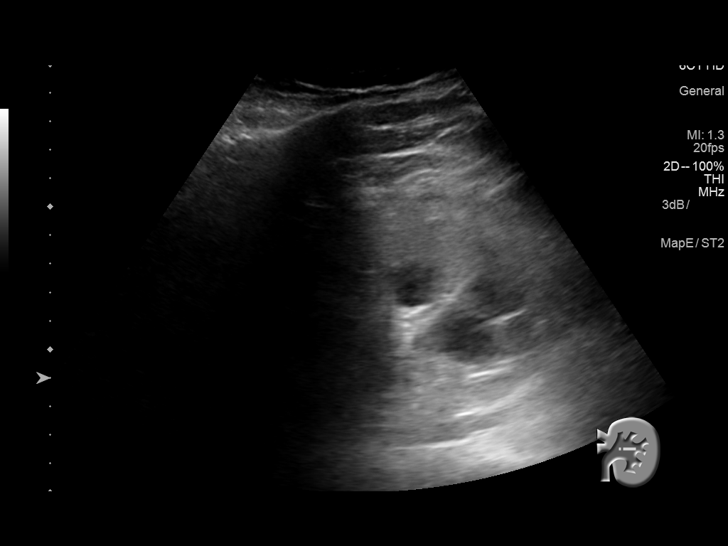
[im 44/53]
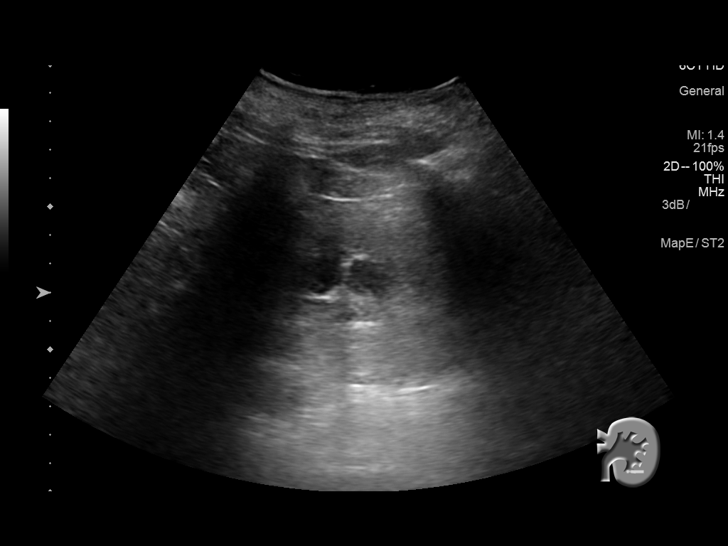
[im 48/53]
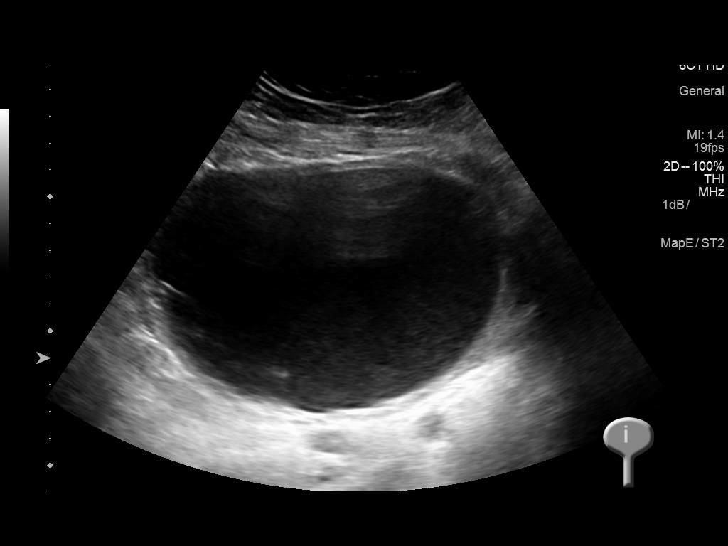
[im 53/53]
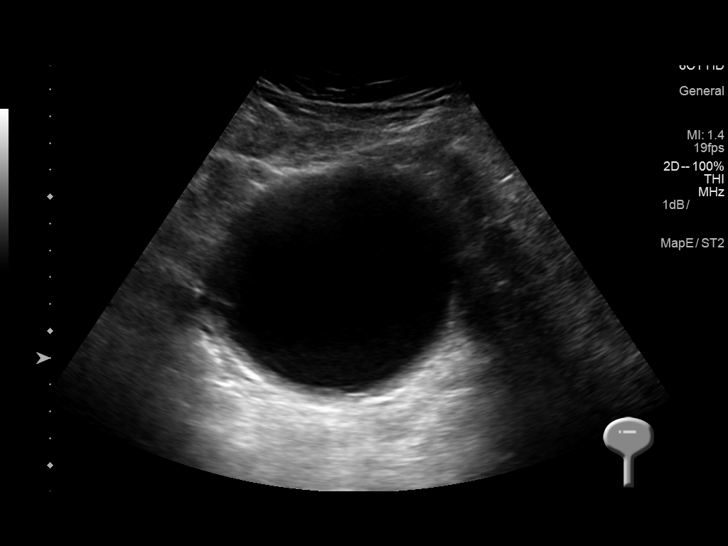

[14 of 25 positions shown; findings below may reference images not displayed]

FINDINGS: Right Kidney:

Length: 13.6 cm. There is severe chronic hydronephrosis with
associated cortical thinning. There are small renal cysts, largest
in the lower pole measuring 12 mm maximally.

Left Kidney:

Length: 12.0 cm. There is severe chronic hydronephrosis with
associated cortical thinning. There are small renal cysts, largest
measuring 12 mm in the upper pole.

Bladder:

Chronic bladder wall thickening and debris noted. Evaluation for
ureteral jets not performed.
IMPRESSION: Little change is seen from the prior studies of 6986. There is
chronic bilateral hydronephrosis and hydroureter with bilateral
renal cortical thinning. Persistent bladder wall thickening and
debris, suggesting chronic bladder outlet obstruction.

## 2016-03-05 ENCOUNTER — Ambulatory Visit: Payer: Self-pay | Admitting: Family Medicine

## 2016-04-21 ENCOUNTER — Other Ambulatory Visit: Payer: Self-pay | Admitting: *Deleted

## 2016-04-21 DIAGNOSIS — N179 Acute kidney failure, unspecified: Secondary | ICD-10-CM

## 2016-04-21 DIAGNOSIS — N1330 Unspecified hydronephrosis: Secondary | ICD-10-CM | POA: Diagnosis not present

## 2016-04-21 DIAGNOSIS — R338 Other retention of urine: Secondary | ICD-10-CM | POA: Diagnosis not present

## 2016-04-21 NOTE — Consult Note (Signed)
Referral received from Dr. Junious Silk for [facility] follow up. Mr. Mccanless is not currently in hospital. Will forward referral to [facility] office to please assign to Telephonic [facility] for follow up due to outpatient  MD referral.  Marthenia Rolling, MSN-Ed, RN,BSN [facility] Liaison [phone]

## 2016-04-23 ENCOUNTER — Other Ambulatory Visit: Payer: Self-pay | Admitting: Urology

## 2016-05-06 ENCOUNTER — Encounter: Payer: Self-pay | Admitting: *Deleted

## 2016-05-06 ENCOUNTER — Other Ambulatory Visit: Payer: Self-pay

## 2016-05-06 NOTE — Patient Outreach (Signed)
[facility]) Care Management  05/06/2016  JACOB CICERO 06/27/1945 [id number]  TELEPHONE SCREENING Referral date: 04/21/16 Referral source: Referral from Urologist, Dr. Junious Silk Referral reason: assess for needs Insurance: Medicare/ Medicaid  SUBJECTIVE: Telephone call to patient regarding specialist referral. HIPAA verified with patient. Discussed and offered [facility] care management services. Patient states all he needs is assistance with transportation. RNCM discussed with patient need to have primary MD. Patient states he sees his urologist, Dr. Junious Silk.  Patient refused assistance with finding primary MD.  [facility] screened patient for nursing needs. Patient states he has all of his medications and takes as prescribed. Patient states overall he is doing well. States he has a good support system from his sons. Patient denies having any new symptoms or concerns at this time.  RNCM advised patient to call his doctor for any health concerns. Patient aware of how and when to access 911 for severe symptoms.  RNCM provided contact name and number for [facility] care management. RNCM provided patient 24 hour nurse advise line contact number  PLAN: RNCM will refer patient to social worker only for transportation assistance.  Quinn Plowman RN,BSN,CCM [facility] Telephonic  [phone]

## 2016-05-06 NOTE — Patient Instructions (Addendum)
[name]  05/06/2016   Your procedure is scheduled on: 05/13/2016    Report to [facility] Main  Entrance and take [facility] to 3rd Floor at 0530am.  .    Call this number if you have problems the morning of surgery [phone]    Remember: ONLY 1 PERSON MAY GO WITH YOU TO SHORT STAY TO GET  READY MORNING OF YOUR SURGERY.  Do not eat food or drink liquids :After Midnight.     Take these medicines the morning of surgery with A SIP OF WATER: Amlodipine ( NORvasc), Carvedilol ( coreg)                                You may not have any metal on your body including hair pins and              piercings  Do not wear jewelry,  lotions, powders or perfumes, deodorant           .              Men may shave face and neck.   Do not bring valuables to the hospital. [place].  Contacts, dentures or bridgework may not be worn into surgery. .     Patients discharged the day of surgery will not be allowed to drive home.  Name and phone number of your driver:                Please read over the following fact sheets you were given: _____________________________________________________________________       Use Dial Soap instead of HIBICLENS SINCE YOU ARE ALLERGIC TO [place] - Preparing for Surgery Before surgery, you can play an important role.  Because skin is not sterile, your skin needs to be as free of germs as possible.  You can reduce the number of germs on your skin by washing with CHG (chlorahexidine gluconate) soap before surgery.  CHG is an antiseptic cleaner which kills germs and bonds with the skin to continue killing germs even after washing. Please DO NOT use if you have an allergy to CHG or antibacterial soaps.  If your skin becomes reddened/irritated stop using the CHG and inform your nurse when you arrive at Short Stay. Do not shave (including legs and underarms) for at  least 48 hours prior to the first CHG shower.  You may shave your face/neck. Please follow these instructions carefully:  1.  Shower with CHG Soap the night before surgery and the  morning of Surgery.  2.  If you choose to wash your hair, wash your hair first as usual with your  normal  shampoo.  3.  After you shampoo, rinse your hair and body thoroughly to remove the  shampoo.                           4.  Use CHG as you would any other liquid soap.  You can apply chg directly  to the skin and wash  Gently with a scrungie or clean washcloth.  5.  Apply the CHG Soap to your body ONLY FROM THE NECK DOWN.   Do not use on face/ open                           Wound or open sores. Avoid contact with eyes, ears mouth and genitals (private parts).                       Wash face,  Genitals (private parts) with your normal soap.             6.  Wash thoroughly, paying special attention to the area where your surgery  will be performed.  7.  Thoroughly rinse your body with warm water from the neck down.  8.  DO NOT shower/wash with your normal soap after using and rinsing off  the CHG Soap.                9.  Pat yourself dry with a clean towel.            10.  Wear clean pajamas.            11.  Place clean sheets on your bed the night of your first shower and do not  sleep with pets. Day of Surgery : Do not apply any lotions/deodorants the morning of surgery.  Please wear clean clothes to the hospital/surgery center.  FAILURE TO FOLLOW THESE INSTRUCTIONS MAY RESULT IN THE CANCELLATION OF YOUR SURGERY PATIENT SIGNATURE_________________________________  NURSE SIGNATURE__________________________________  ________________________________________________________________________

## 2016-05-07 ENCOUNTER — Inpatient Hospital Stay (HOSPITAL_COMMUNITY)
Admission: RE | Admit: 2016-05-07 | Discharge: 2016-05-07 | Disposition: A | Discharge status: Home / Self Care | Payer: Self-pay | Source: Ambulatory Visit

## 2016-05-08 ENCOUNTER — Other Ambulatory Visit: Payer: Self-pay | Admitting: *Deleted

## 2016-05-08 NOTE — Patient Outreach (Signed)
[facility]) Care Management  05/08/2016  DOYT CASTELLANA 1945/06/10 [id number]   CSW made an initial attempt to try and contact patient today to perform phone assessment, as well as assess and assist with social needs and services, without success.  A HIPAA compliant message was left for patient on voicemail.  CSW is currently awaiting a return call. CSW will make a second outreach attempt within the next week, if CSW does not receive a return call from patient in the meantime. Nat Christen, BSW, MSW, LCSW  Licensed Education officer, [facility]  Mailing [place] [address] [place], [place] [address] Physical Address-[address], [place], [place] [address] Toll Free Main # [phone] Fax # [phone] Cell # [phone]  Office # [phone] [email or web address]

## 2016-05-08 NOTE — Patient Instructions (Addendum)
[name]  05/08/2016   Your procedure is scheduled on: 05-13-16  Report to [facility] Main  Entrance Take [place]  elevators to 3rd floor to  [name] at 530AM.   Call this number if you have problems the morning of surgery [phone]   Remember: ONLY 1 PERSON MAY GO WITH YOU TO SHORT STAY TO GET  READY MORNING OF [place].  Do not eat food or drink liquids :After Midnight.     Take these medicines the morning of surgery with A SIP OF WATER: amlodipine(Norvasc), carvedilol(coreg), rosuvastatin(Crestor)                                You may not have any metal on your body including hair pins and              piercings  Do not wear jewelry, make-up, lotions, powders or perfumes, deodorant              Men may shave face and neck.   Do not bring valuables to the hospital. [place].  Contacts, dentures or bridgework may not be worn into surgery.      Patients discharged the day of surgery will not be allowed to drive home.  Name and phone number of your driver:  Special Instructions: N/A              Please read over the following fact sheets you were given: _____________________________________________________________________  Because you are allergic to Chlorhexidine Gluconate, please use Gold Dial antibacterial soap to complete you 2 required showers prior to surgery            [place] - Preparing for Surgery Before surgery, you can play an important role.  Because skin is not sterile, your skin needs to be as free of germs as possible.  You can reduce the number of germs on your skin by washing with CHG (chlorahexidine gluconate) soap before surgery.  CHG is an antiseptic cleaner which kills germs and bonds with the skin to continue killing germs even after washing. Please DO NOT use if you have an allergy to CHG or antibacterial soaps.  If your skin becomes reddened/irritated stop  using the CHG and inform your nurse when you arrive at Short Stay. Do not shave (including legs and underarms) for at least 48 hours prior to the first CHG shower.  You may shave your face/neck. Please follow these instructions carefully:  1.  Shower with GOLD DIAL SOAP the night before surgery and the  morning of Surgery.  2.  If you choose to wash your hair, wash your hair first as usual with your  normal  shampoo.  3.  After you shampoo, rinse your hair and body thoroughly to remove the  shampoo.                           4.  Use CHG as you would any other liquid soap.  You can apply chg directly  to the skin and wash                       Gently with a scrungie or  clean washcloth.  5.  Apply the CHG Soap to your body ONLY FROM THE NECK DOWN.   Do not use on face/ open                           Wound or open sores. Avoid contact with eyes, ears mouth and genitals (private parts).                       Wash face,  Genitals (private parts) with your normal soap.             6.  Wash thoroughly, paying special attention to the area where your surgery  will be performed.  7.  Thoroughly rinse your body with warm water from the neck down.  8.  DO NOT shower/wash with your normal soap after using and rinsing off  the CHG Soap.                9.  Pat yourself dry with a clean towel.            10.  Wear clean pajamas.            11.  Place clean sheets on your bed the night of your first shower and do not  sleep with pets. Day of Surgery : Do not apply any lotions/deodorants the morning of surgery.  Please wear clean clothes to the hospital/surgery center.  FAILURE TO FOLLOW THESE INSTRUCTIONS MAY RESULT IN THE CANCELLATION OF YOUR SURGERY PATIENT SIGNATURE_________________________________  NURSE SIGNATURE__________________________________  ________________________________________________________________________

## 2016-05-08 NOTE — Progress Notes (Signed)
[place] cardiology Dr Stanford Breed 11-06-14 epic EKG 11-18-15  epic

## 2016-05-09 ENCOUNTER — Encounter (HOSPITAL_COMMUNITY)
Admission: RE | Admit: 2016-05-09 | Discharge: 2016-05-09 | Disposition: A | Discharge status: Home / Self Care | Payer: Medicare Other | Source: Ambulatory Visit | Attending: Urology | Admitting: Urology

## 2016-05-09 ENCOUNTER — Encounter (HOSPITAL_COMMUNITY): Payer: Self-pay

## 2016-05-09 DIAGNOSIS — Z01812 Encounter for preprocedural laboratory examination: Secondary | ICD-10-CM | POA: Diagnosis not present

## 2016-05-09 DIAGNOSIS — N1339 Other hydronephrosis: Secondary | ICD-10-CM | POA: Insufficient documentation

## 2016-05-09 LAB — CBC
HEMATOCRIT: 33.9 % — AB (ref 39.0–52.0)
HEMOGLOBIN: 11 g/dL — AB (ref 13.0–17.0)
MCH: 26.6 pg (ref 26.0–34.0)
MCHC: 32.4 g/dL (ref 30.0–36.0)
MCV: 82.1 fL (ref 78.0–100.0)
Platelets: 333 10*3/uL (ref 150–400)
RBC: 4.13 MIL/uL — AB (ref 4.22–5.81)
RDW: 16.4 % — AB (ref 11.5–15.5)
WBC: 6.4 10*3/uL (ref 4.0–10.5)

## 2016-05-09 LAB — COMPREHENSIVE METABOLIC PANEL
ALBUMIN: 3.3 g/dL — AB (ref 3.5–5.0)
ALK PHOS: 103 U/L (ref 38–126)
ALT: 12 U/L — AB (ref 17–63)
AST: 15 U/L (ref 15–41)
Anion gap: 7 (ref 5–15)
BILIRUBIN TOTAL: 0.2 mg/dL — AB (ref 0.3–1.2)
BUN: 34 mg/dL — AB (ref 6–20)
CO2: 20 mmol/L — ABNORMAL LOW (ref 22–32)
CREATININE: 2.2 mg/dL — AB (ref 0.61–1.24)
Calcium: 9.7 mg/dL (ref 8.9–10.3)
Chloride: 113 mmol/L — ABNORMAL HIGH (ref 101–111)
GFR calc Af Amer: 32 mL/min — ABNORMAL LOW (ref >60)
GFR calc non Af Amer: 28 mL/min — ABNORMAL LOW (ref >60)
GLUCOSE: 105 mg/dL — AB (ref 65–99)
POTASSIUM: 4.3 mmol/L (ref 3.5–5.1)
Sodium: 140 mmol/L (ref 135–145)
TOTAL PROTEIN: 8 g/dL (ref 6.5–8.1)

## 2016-05-09 NOTE — Progress Notes (Signed)
CMP results routed via epic to Dr Junious Silk to pod

## 2016-05-12 NOTE — H&P (Signed)
Office Visit Report     04/21/2016  --------------------------------------------------------------------------------  Bruce Donath. Chaput  MRN: [id number]  PRIMARY CARE:    DOB: [birth year], 71 year old Male  REFERRING:    SSN: [id number]  PROVIDER:  Festus Aloe, M.D.    LOCATION:  [facility] [phone]  --------------------------------------------------------------------------------  CC: I have hydronephrosis.  HPI: [name] is a 71 year-old male established patient who is here for hydronephrosis. The problem is on both sides. He had the following x-rays done: CT Scan.  He is not currently having flank pain, back pain, groin pain, nausea, vomiting, fever or chills.  He had a creatinine of 9.4 which decreased into the 3 range after a A Foley catheter was placed November 2017. He returns for evaluation. He had a similar episode of retention and hydro Jan 2017. His creatinine at that time recovered to 2.14.  CC: I have urinary retention.  HPI: His problem was diagnosed 11/17/2015. His current symptoms did not begin after he had a surgical procedure. His urinary retention is being treated with foley catheter. Patient denies suprapubic tube, intemittent catheterization, flomax, hytrin, cardura, uroxatrol, rapaflo, avodart, and proscar.   A Foley catheter was placed. His creatinine improved. He had a similar episode January 2017 with retention and hydrated. He was taken for cystoscopy , retrogrades, bladder and urethral biopsy March 2017. Bladder bx was inflammation/cystitis. Prostate urethral biopsy - submucosal nested urothelium with marked cytologic atypia. F/u recommended. There was no sx but a bridge of tissue in bulb from prior dilation.   ALLERGIES: No Allergies  MEDICATIONS: Amlodipine Besylate 10 mg tablet Oral  Aspirin Ec 81 mg tablet, delayed release Oral  Carvedilol 12.5 MG Oral Tablet Oral  Crestor 40 MG Oral Tablet Oral     GU PSH: Cysto Dilate Stricture (M or F) -  2012, 2011 Cysto Fulgurate < 0.5 cm - 04/10/2015      PSH Notes: Cystoscopy With Fulguration Minor Lesion (Under 43mm), Heart Surgery, Cystoscopy For Urethral Stricture, Cystoscopy For Urethral Stricture   NON-GU PSH: None   GU PMH: Other urethral stricture, Bulbous urethral stricture - 05/03/2015 Urethral disorders, Unspec, Lesion of urethra - 05/03/2015 Hydronephrosis Unspec, Hydronephrosis - 02/16/2015   NON-GU PMH: Bacteriuria, Bacteriuria, asymptomatic - 04/02/2015 Encounter for general adult medical examination without abnormal findings, Encounter for preventive health examination - 03/29/2015 Myocardial Infarction, History of myocardial infarction - 02/16/2015 Personal history of other diseases of the circulatory system, History of hypertension - 2014   FAMILY HISTORY: Family Health Status Number - Runs In Family Father Deceased At Age53 ___ - Runs In Family Mother Deceased At Age 41 from diabetic complicati - Runs In Family   SOCIAL HISTORY: No Social History     Notes: Marital History - Divorced, Caffeine Use, Alcohol Use, Never A Smoker   REVIEW OF SYSTEMS:    GU Review Male:   Patient denies frequent urination, hard to postpone urination, burning/ pain with urination, get up at night to urinate, leakage of urine, stream starts and stops, trouble starting your stream, have to strain to urinate , erection problems, and penile pain.  Gastrointestinal (Upper):   Patient denies nausea, vomiting, and indigestion/ heartburn.  Gastrointestinal (Lower):   Patient denies diarrhea and constipation.  Constitutional:   Patient denies fever, night sweats, weight loss, and fatigue.  Skin:   Patient denies skin rash/ lesion and itching.  Eyes:   Patient denies blurred vision and double vision.  Ears/ Nose/ Throat:   Patient denies sore  throat and sinus problems.  Hematologic/Lymphatic:   Patient denies swollen glands and easy bruising.  Cardiovascular:   Patient denies leg swelling and chest pains.   Respiratory:   Patient denies cough and shortness of breath.  Endocrine:   Patient denies excessive thirst.  Musculoskeletal:   Patient denies back pain and joint pain.  Neurological:   Patient denies headaches and dizziness.  Psychologic:   Patient denies depression and anxiety.   VITAL SIGNS:      04/21/2016 10:28 AM  Weight 185 lb / 83.91 kg  Height 66 in / 167.64 cm  BP 163/89 mmHg  Pulse 62 /min  Temperature 97.5 F / 36 C  BMI 29.9 kg/m   GU PHYSICAL EXAMINATION:    Scrotum: No lesions. No edema. No cysts. No warts.  Urethral Meatus: Normal size. No lesion, no wart, no discharge, no polyp. Normal location.  Penis: Circumcised, no warts, no cracks. No dorsal Peyronie's plaques, no left corporal Peyronie's plaques, no right corporal Peyronie's plaques, no scarring, no warts. No balanitis, no meatal stenosis.   MULTI-SYSTEM PHYSICAL EXAMINATION:    Constitutional: Well-nourished. No physical deformities. Normally developed. Good grooming.  Neck: Neck symmetrical, not swollen. Normal tracheal position.  Respiratory: No labored breathing, no use of accessory muscles.   Cardiovascular: Normal temperature, normal extremity pulses, no swelling, no varicosities.  Skin: No paleness, no jaundice, no cyanosis. No lesion, no ulcer, no rash.  Neurologic / Psychiatric: Oriented to time, oriented to place, oriented to person. No depression, no anxiety, no agitation.    PAST DATA REVIEWED:  Source Of History:  Patient   PROCEDURES:         Flexible Cystoscopy - 52000  Risks, benefits, and some of the potential complications of the procedure were discussed with the patient. All questions were answered. Informed consent was obtained. Antibiotic prophylaxis was given -- Cipro. Sterile technique and intraurethral analgesia were used. He was filled to 325 mL and felt the need to void but could not.   Meatus:  Normal size. Normal location. Normal condition.  Urethra:  No strictures. In the bulb  there is evidence of prior false passage and some white tissue - maybe squamous metaplasia. More proximal to this may be in the membranous urethra or prostatic there are some polypoid tissue growing out from the right wall.  External Sphincter:  Normal.  Verumontanum:  Normal.  Prostate:  Non-obstructing. No hyperplasia.  Bladder Neck:  Non-obstructing.  Ureteral Orifices:  Normal location. Normal size. Normal shape. Effluxed clear urine.  Bladder:  No trabeculation. No tumors. Normal mucosa. No stones.   The lower urinary tract was carefully examined. The procedure was well-tolerated and without complications. Antibiotic instructions were given. Instructions were given to call the office immediately for bloody urine, difficulty urinating, painful urination, fever, chills, nausea, vomiting or other illness. The patient stated that he understood these instructions and would comply with them.        Catheter / SP Tube - [id number] Simple Foley Catheterization  A 16 French Foley catheter was inserted into the bladder using sterile technique. The patient was taught routine catheter care. 300 cc of urine was obtained.  ASSESSMENT:      ICD-10 Details  1 GU:   Hydronephrosis Unspec - N13.30   2   Urinary Retention - R33.8   3   Urethral disorders, Unspec - N36.9 Stable   PLAN:          Medications Stop Meds: Ceftriaxone 1 gram vial 1  Injection  Start: 03/29/2015  Discontinue: 04/21/2016  - Reason: The medication cycle was completed.  Levofloxacin 250 mg tablet 0 Oral  Start: 04/02/2015  Discontinue: 04/21/2016  - Reason: The medication cycle was completed.          Schedule Return Visit/Planned Activity: Next Available Appointment - Schedule Surgery  Return Visit/Planned Activity: Next Available Appointment - Urodynamics          Document Letter(s):  Created for Patient: Clinical Summary        Notes:   urinary retention - foley replaced. Uncertain if this is related to the urethral changes. He  needs urodynamics.   hydronephrsis - I said BUN and creatinine today. Will plan retrogrades with biopsy.   Urethral polyps-given his retention and atypia on prior biopsy had discussed with him the nature risks benefits and alternatives to biopsy of these urethral areas. All questions answered. We discussed risk of stricture and incontinence among others. I sent a letter to his case manager in hopes they can help him with follow-up.    * Signed by Festus Aloe, M.D. on 04/21/16 at 5:13 PM (EDT)*     The information contained in this medical record document is considered private and confidential patient information. This information can only be used for the medical diagnosis and/or medical services that are being provided by the patient's selected caregivers. This information can only be distributed outside of the patient's care if the patient agrees and signs waivers of authorization for this information to be sent to an outside source or route.

## 2016-05-13 ENCOUNTER — Encounter (HOSPITAL_COMMUNITY): Admission: RE | Payer: Self-pay | Source: Ambulatory Visit

## 2016-05-13 ENCOUNTER — Ambulatory Visit (HOSPITAL_COMMUNITY): Admission: RE | Admit: 2016-05-13 | Payer: Medicare Other | Source: Ambulatory Visit | Admitting: Urology

## 2016-05-13 SURGERY — CYSTOSCOPY, WITH BIOPSY
Anesthesia: General

## 2016-05-14 ENCOUNTER — Other Ambulatory Visit: Payer: Self-pay | Admitting: Urology

## 2016-05-15 ENCOUNTER — Other Ambulatory Visit: Payer: Self-pay | Admitting: *Deleted

## 2016-05-15 NOTE — Patient Outreach (Signed)
[facility]) Care Management  05/15/2016  DIRK VANAMAN [birth year] [id number]   CSW made a second attempt to try and contact patient today to perform phone assessment, as well as assess and assist with social needs and services, without success.  A HIPAA compliant message was left for patient on voicemail.  CSW is currently awaiting a return call. CSW will make a second outreach attempt within the next week, if CSW does not receive a return call from patient in the meantime. Nat Christen, BSW, MSW, LCSW  Licensed Education officer, environmental Health System  Mailing [place] [address], [place], [place] [address] Physical Address-[address], [place], [place] [address] Toll Free Main # [phone] Fax # [phone] Cell # [phone]  Office # [phone] [email or web address]

## 2016-05-16 ENCOUNTER — Encounter: Payer: Self-pay | Admitting: *Deleted

## 2016-05-16 ENCOUNTER — Other Ambulatory Visit: Payer: Self-pay | Admitting: *Deleted

## 2016-05-16 NOTE — Patient Outreach (Signed)
[name]) Care Management  05/16/2016  KY RUMPLE [birth year] [id number]   CSW was able to make contact with patient today to perform the initial phone assessment, as well as assess and assist with social work needs and services.  CSW introduced self, explained role and types of services provided through [facility] ([facility]).  CSW further explained to patient that CSW works with patient's Telephonic RNCM, also with [facility], Quinn Plowman. CSW then explained the reason for the call, indicating that Mrs. [name] thought that patient would benefit from social work services and resources to assist with obtaining transportation to and from physician appointments.  CSW obtained two HIPAA compliant identifiers from patient, which included patient's name and date of birth. Patient admits that he just bought his own vehicle, "tired of relying on other people to try and get me where I need to go". Patient reported that he picks up his vehicle tomorrow and that he will begin driving himself to all his physician appointments.  Patient denied having any additional social work needs at present.  CSW provided patient with CSW's contact information, encouraging patient to contact CSW directly if social work needs arise in the near future.  Patient voiced understanding and was agreeable to this plan. CSW will perform a case closure on patient, as all goals of treatment have been met from social work standpoint and no additional social work needs have been identified at this time.  CSW will notify patient's Telephonic RNCM with [facility], of CSW's plans to close patient's case.  CSW will submit a case closure request to Verlon Setting, Care Management Assistant with [facility], in the form of an [employer].  CSW will ensure that Mrs. Comer is aware of Mrs. Green's, Telephonic RNCM with  [facility], continued involvement with patient's care. Nat Christen, BSW, MSW, LCSW  Licensed Education officer, environmental Health System  Mailing [place] [address] [place], [place] [address] Physical Address-[address], [place], [place] [address] Toll Free Main # [phone] Fax # [phone] Cell # [phone]  Office # [phone] [email or web address]

## 2016-05-21 ENCOUNTER — Other Ambulatory Visit: Payer: Self-pay | Admitting: *Deleted

## 2016-05-21 NOTE — Patient Outreach (Signed)
[facility]) Care Management  05/21/2016  [name] 31-Mar-1945 [id number]   Erroneous Encounter. Nat Christen, BSW, MSW, LCSW  Licensed Education officer, environmental Health System  Mailing [place] [address] [place], [place] [address] Physical Address-[address], [place], [place] [address] Toll Free Main # [phone] Fax # [phone] Cell # [phone]  Office # [phone] [email or web address]

## 2016-05-28 ENCOUNTER — Other Ambulatory Visit: Payer: Self-pay | Admitting: Cardiology

## 2016-06-06 ENCOUNTER — Ambulatory Visit (HOSPITAL_COMMUNITY): Admission: RE | Admit: 2016-06-06 | Payer: Medicare Other | Source: Ambulatory Visit | Admitting: Urology

## 2016-06-06 ENCOUNTER — Encounter (HOSPITAL_COMMUNITY): Payer: Self-pay | Admitting: Certified Registered Nurse Anesthetist

## 2016-06-06 ENCOUNTER — Other Ambulatory Visit: Payer: Self-pay | Admitting: Urology

## 2016-06-06 ENCOUNTER — Other Ambulatory Visit: Payer: Self-pay | Admitting: *Deleted

## 2016-06-06 SURGERY — CYSTOSCOPY, WITH BIOPSY
Anesthesia: General | Laterality: Bilateral

## 2016-06-06 MED ORDER — CEFAZOLIN SODIUM-DEXTROSE 2-4 GM/100ML-% IV SOLN: 2.0000 g | INTRAVENOUS | Status: DC

## 2016-06-06 NOTE — Patient Outreach (Signed)
[facility]) Care Management  06/06/2016  KERRY CHISOLM 05-19-1945 [id number]  Call received from Dion Saucier, Preoperative Services with [facility], indicating that patient missed his surgical procedure for the second time this morning.  Patient claims that he missed his procedure due to not having adequate transportation.  However, last time CSW spoke with patient, patient assured CSW that he had transportation to this mornings appointment, via his son, Zadyn Yardley, Brooke Bonito., in addition to recently purchasing his own vehicle. CSW agreed to arrange transportation for patient to his re-scheduled surgical procedure, which will take place on Friday, June 15th at 5:30am, for which patient will need to arrive at [facility], Admitting Department, by 5:00am.  Transportation arrangements have been made through [facility], at the expense of [facility], by Freda Jackson, Care Management Assistant with [facility].  Mrs. Pearline Cables agreed to contact patient to update him with the following information. Nat Christen, BSW, MSW, LCSW  Licensed Education officer, environmental Health System  Mailing [place] [address], [place], [place] [address] Physical Address-[address], [place],  [address] Toll Free Main # [phone] Fax # [phone] Cell # [phone]  Office # [phone] [email or web address]

## 2016-06-06 NOTE — Anesthesia Preprocedure Evaluation (Deleted)
Anesthesia Evaluation  Patient identified by MRN, date of birth, ID band Patient awake    Reviewed: Allergy & Precautions, NPO status , Patient's Chart, lab work & pertinent test results  Airway Mallampati: I  TM Distance: >3 FB Neck ROM: Full    Dental   Pulmonary former smoker,    Pulmonary exam normal        Cardiovascular hypertension, Pt. on medications + CAD, + Past MI and +CHF  Normal cardiovascular exam  ECG: SR, rate 63 3/14 ECHO Study Conclusions  - Left ventricle: Akinesis of following segments: mid/apical inferior septal, mid/apical inferior, mid anterior septal. The cavity size was normal. Wall thickness was increased in a pattern of mild LVH. The estimated ejection fraction was 45%. - Mitral valve: Mild regurgitation. - Left atrium: The atrium was mildly to moderately dilated. Impressions:  - Overall LV function is a little better than 11/2009 study. There is no residual VSD.    Neuro/Psych    GI/Hepatic   Endo/Other    Renal/GU CRFRenal disease     Musculoskeletal   Abdominal   Peds  Hematology  (+) anemia ,   Anesthesia Other Findings Hyperlipidemia  Reproductive/Obstetrics                             Anesthesia Physical  Anesthesia Plan  ASA: III  Anesthesia Plan: General   Post-op Pain Management:    Induction: Intravenous  Airway Management Planned: LMA  Additional Equipment:   Intra-op Plan:   Post-operative Plan:   Informed Consent: I have reviewed the patients History and Physical, chart, labs and discussed the procedure including the risks, benefits and alternatives for the proposed anesthesia with the patient or authorized representative who has indicated his/her understanding and acceptance.     Plan Discussed with: CRNA and Surgeon  Anesthesia Plan Comments:         Anesthesia Quick Evaluation

## 2016-06-26 NOTE — Anesthesia Preprocedure Evaluation (Signed)
Anesthesia Evaluation  Patient identified by MRN, date of birth, ID band Patient awake    Reviewed: Allergy & Precautions, NPO status , Patient's Chart, lab work & pertinent test results  Airway Mallampati: I  TM Distance: >3 FB Neck ROM: Full    Dental   Pulmonary former smoker,    Pulmonary exam normal        Cardiovascular hypertension, Pt. on medications + CAD, + Past MI and +CHF  Normal cardiovascular exam  ECG: SR, rate 63 3/14 ECHO Study Conclusions  - Left ventricle: Akinesis of following segments: mid/apical inferior septal, mid/apical inferior, mid anterior septal. The cavity size was normal. Wall thickness was increased in a pattern of mild LVH. The estimated ejection fraction was 45%. - Mitral valve: Mild regurgitation. - Left atrium: The atrium was mildly to moderately dilated. Impressions:  - Overall LV function is a little better than 11/2009 study. There is no residual VSD.    Neuro/Psych    GI/Hepatic   Endo/Other    Renal/GU CRFRenal disease     Musculoskeletal   Abdominal   Peds  Hematology  (+) anemia ,   Anesthesia Other Findings Hyperlipidemia  Reproductive/Obstetrics                             Anesthesia Physical  Anesthesia Plan  ASA: III  Anesthesia Plan: General   Post-op Pain Management:    Induction: Intravenous  PONV Risk Score and Plan:   Airway Management Planned: LMA  Additional Equipment:   Intra-op Plan:   Post-operative Plan:   Informed Consent: I have reviewed the patients History and Physical, chart, labs and discussed the procedure including the risks, benefits and alternatives for the proposed anesthesia with the patient or authorized representative who has indicated his/her understanding and acceptance.     Plan Discussed with: CRNA and Surgeon  Anesthesia Plan Comments:         Anesthesia Quick  Evaluation

## 2016-06-27 ENCOUNTER — Ambulatory Visit (HOSPITAL_COMMUNITY)
Admission: RE | Admit: 2016-06-27 | Discharge: 2016-06-27 | Disposition: A | Discharge status: Home / Self Care | Payer: Medicare Other | Source: Ambulatory Visit | Attending: Urology | Admitting: Urology

## 2016-06-27 ENCOUNTER — Encounter (HOSPITAL_COMMUNITY)
Admission: RE | Disposition: A | Discharge status: Home / Self Care | Payer: Self-pay | Source: Ambulatory Visit | Attending: Urology

## 2016-06-27 ENCOUNTER — Ambulatory Visit (HOSPITAL_COMMUNITY): Payer: Medicare Other | Admitting: Anesthesiology

## 2016-06-27 ENCOUNTER — Encounter (HOSPITAL_COMMUNITY): Payer: Self-pay

## 2016-06-27 ENCOUNTER — Ambulatory Visit (HOSPITAL_COMMUNITY): Payer: Medicare Other

## 2016-06-27 DIAGNOSIS — N403 Nodular prostate with lower urinary tract symptoms: Secondary | ICD-10-CM | POA: Diagnosis not present

## 2016-06-27 DIAGNOSIS — N138 Other obstructive and reflux uropathy: Secondary | ICD-10-CM | POA: Diagnosis not present

## 2016-06-27 DIAGNOSIS — Z951 Presence of aortocoronary bypass graft: Secondary | ICD-10-CM | POA: Diagnosis not present

## 2016-06-27 DIAGNOSIS — E785 Hyperlipidemia, unspecified: Secondary | ICD-10-CM | POA: Diagnosis not present

## 2016-06-27 DIAGNOSIS — I509 Heart failure, unspecified: Secondary | ICD-10-CM | POA: Insufficient documentation

## 2016-06-27 DIAGNOSIS — R339 Retention of urine, unspecified: Secondary | ICD-10-CM | POA: Diagnosis not present

## 2016-06-27 DIAGNOSIS — I10 Essential (primary) hypertension: Secondary | ICD-10-CM | POA: Diagnosis not present

## 2016-06-27 DIAGNOSIS — C7919 Secondary malignant neoplasm of other urinary organs: Secondary | ICD-10-CM | POA: Insufficient documentation

## 2016-06-27 DIAGNOSIS — N368 Other specified disorders of urethra: Secondary | ICD-10-CM | POA: Diagnosis not present

## 2016-06-27 DIAGNOSIS — Z89021 Acquired absence of right finger(s): Secondary | ICD-10-CM | POA: Insufficient documentation

## 2016-06-27 DIAGNOSIS — I110 Hypertensive heart disease with heart failure: Secondary | ICD-10-CM | POA: Diagnosis not present

## 2016-06-27 DIAGNOSIS — N362 Urethral caruncle: Secondary | ICD-10-CM | POA: Diagnosis not present

## 2016-06-27 DIAGNOSIS — I5022 Chronic systolic (congestive) heart failure: Secondary | ICD-10-CM | POA: Diagnosis not present

## 2016-06-27 DIAGNOSIS — D494 Neoplasm of unspecified behavior of bladder: Secondary | ICD-10-CM | POA: Diagnosis not present

## 2016-06-27 DIAGNOSIS — C61 Malignant neoplasm of prostate: Secondary | ICD-10-CM | POA: Diagnosis not present

## 2016-06-27 DIAGNOSIS — I252 Old myocardial infarction: Secondary | ICD-10-CM | POA: Diagnosis not present

## 2016-06-27 DIAGNOSIS — N1330 Unspecified hydronephrosis: Secondary | ICD-10-CM | POA: Insufficient documentation

## 2016-06-27 DIAGNOSIS — C7911 Secondary malignant neoplasm of bladder: Secondary | ICD-10-CM | POA: Insufficient documentation

## 2016-06-27 DIAGNOSIS — Z87891 Personal history of nicotine dependence: Secondary | ICD-10-CM | POA: Diagnosis not present

## 2016-06-27 DIAGNOSIS — I2510 Atherosclerotic heart disease of native coronary artery without angina pectoris: Secondary | ICD-10-CM | POA: Diagnosis not present

## 2016-06-27 DIAGNOSIS — Z79899 Other long term (current) drug therapy: Secondary | ICD-10-CM | POA: Diagnosis not present

## 2016-06-27 HISTORY — PX: TRANSURETHRAL RESECTION OF PROSTATE: SHX73

## 2016-06-27 HISTORY — PX: CYSTOSCOPY WITH BIOPSY: SHX5122

## 2016-06-27 HISTORY — PX: CYSTOSCOPY W/ RETROGRADES: SHX1426

## 2016-06-27 LAB — BASIC METABOLIC PANEL
ANION GAP: 6 (ref 5–15)
BUN: 33 mg/dL — ABNORMAL HIGH (ref 6–20)
CALCIUM: 9.5 mg/dL (ref 8.9–10.3)
CHLORIDE: 111 mmol/L (ref 101–111)
CO2: 21 mmol/L — AB (ref 22–32)
Creatinine, Ser: 2.28 mg/dL — ABNORMAL HIGH (ref 0.61–1.24)
GFR calc non Af Amer: 27 mL/min — ABNORMAL LOW (ref >60)
GFR, EST AFRICAN AMERICAN: 31 mL/min — AB (ref >60)
Glucose, Bld: 107 mg/dL — ABNORMAL HIGH (ref 65–99)
POTASSIUM: 4.4 mmol/L (ref 3.5–5.1)
Sodium: 138 mmol/L (ref 135–145)

## 2016-06-27 LAB — HEMOGLOBIN: Hemoglobin: 11.2 g/dL — ABNORMAL LOW (ref 13.0–17.0)

## 2016-06-27 SURGERY — CYSTOSCOPY, WITH BIOPSY
Anesthesia: General

## 2016-06-27 MED ORDER — LIDOCAINE 2% (20 MG/ML) 5 ML SYRINGE
INTRAMUSCULAR | Status: AC
  Filled 2016-06-27: qty 5

## 2016-06-27 MED ORDER — ONDANSETRON HCL 4 MG/2ML IJ SOLN
INTRAMUSCULAR | Status: AC
  Filled 2016-06-27: qty 2

## 2016-06-27 MED ORDER — FENTANYL CITRATE (PF) 100 MCG/2ML IJ SOLN
INTRAMUSCULAR | Status: DC | PRN
  Administered 2016-06-27 (×3): 50 ug via INTRAVENOUS

## 2016-06-27 MED ORDER — FENTANYL CITRATE (PF) 250 MCG/5ML IJ SOLN
INTRAMUSCULAR | Status: AC
  Filled 2016-06-27: qty 5

## 2016-06-27 MED ORDER — PHENYLEPHRINE HCL 10 MG/ML IJ SOLN
INTRAMUSCULAR | Status: DC | PRN
  Administered 2016-06-27 (×2): 80 ug via INTRAVENOUS
  Administered 2016-06-27 (×3): 120 ug via INTRAVENOUS

## 2016-06-27 MED ORDER — LIDOCAINE HCL (CARDIAC) 10 MG/ML IV SOLN
INTRAVENOUS | Status: DC | PRN
  Administered 2016-06-27: 75 mg via INTRAVENOUS

## 2016-06-27 MED ORDER — MIDAZOLAM HCL 5 MG/5ML IJ SOLN
INTRAMUSCULAR | Status: DC | PRN
  Administered 2016-06-27 (×2): 1 mg via INTRAVENOUS

## 2016-06-27 MED ORDER — ONDANSETRON HCL 4 MG/2ML IJ SOLN
INTRAMUSCULAR | Status: DC | PRN
  Administered 2016-06-27: 4 mg via INTRAVENOUS

## 2016-06-27 MED ORDER — STERILE WATER FOR IRRIGATION IR SOLN
Status: DC | PRN
  Administered 2016-06-27: 1000 mL

## 2016-06-27 MED ORDER — PHENYLEPHRINE HCL 10 MG/ML IJ SOLN
INTRAVENOUS | Status: DC | PRN
  Administered 2016-06-27: 25 ug/min via INTRAVENOUS

## 2016-06-27 MED ORDER — SODIUM CHLORIDE 0.9 % IR SOLN
Status: DC | PRN
  Administered 2016-06-27: 16000 mL via INTRAVESICAL

## 2016-06-27 MED ORDER — LACTATED RINGERS IV SOLN
INTRAVENOUS | Status: DC
  Administered 2016-06-27: 08:00:00 via INTRAVENOUS
  Administered 2016-06-27: 1000 mL via INTRAVENOUS

## 2016-06-27 MED ORDER — PROPOFOL 10 MG/ML IV BOLUS
INTRAVENOUS | Status: AC
  Filled 2016-06-27: qty 40

## 2016-06-27 MED ORDER — MIDAZOLAM HCL 2 MG/2ML IJ SOLN
INTRAMUSCULAR | Status: AC
  Filled 2016-06-27: qty 2

## 2016-06-27 MED ORDER — DEXAMETHASONE SODIUM PHOSPHATE 10 MG/ML IJ SOLN
INTRAMUSCULAR | Status: DC | PRN
  Administered 2016-06-27: 5 mg via INTRAVENOUS

## 2016-06-27 MED ORDER — ACETAMINOPHEN 10 MG/ML IV SOLN
INTRAVENOUS | Status: AC
  Filled 2016-06-27: qty 100

## 2016-06-27 MED ORDER — PHENYLEPHRINE HCL 10 MG/ML IJ SOLN
INTRAMUSCULAR | Status: AC
  Filled 2016-06-27: qty 1

## 2016-06-27 MED ORDER — FENTANYL CITRATE (PF) 100 MCG/2ML IJ SOLN: 25.0000 ug | INTRAMUSCULAR | Status: DC | PRN

## 2016-06-27 MED ORDER — DEXAMETHASONE SODIUM PHOSPHATE 10 MG/ML IJ SOLN
INTRAMUSCULAR | Status: AC
  Filled 2016-06-27: qty 1

## 2016-06-27 MED ORDER — PROPOFOL 10 MG/ML IV BOLUS
INTRAVENOUS | Status: DC | PRN
  Administered 2016-06-27: 30 mg via INTRAVENOUS
  Administered 2016-06-27: 140 mg via INTRAVENOUS
  Administered 2016-06-27: 30 mg via INTRAVENOUS

## 2016-06-27 MED ORDER — PHENYLEPHRINE 40 MCG/ML (10ML) SYRINGE FOR IV PUSH (FOR BLOOD PRESSURE SUPPORT)
PREFILLED_SYRINGE | INTRAVENOUS | Status: AC
  Filled 2016-06-27: qty 10

## 2016-06-27 MED ORDER — CEFAZOLIN SODIUM-DEXTROSE 2-4 GM/100ML-% IV SOLN
2.0000 g | INTRAVENOUS | Status: AC
  Administered 2016-06-27: 2 g via INTRAVENOUS
  Filled 2016-06-27: qty 100

## 2016-06-27 MED ORDER — ACETAMINOPHEN 10 MG/ML IV SOLN
INTRAVENOUS | Status: DC | PRN
  Administered 2016-06-27: 1000 mg via INTRAVENOUS

## 2016-06-27 SURGICAL SUPPLY — 23 items
BAG URINE DRAINAGE (UROLOGICAL SUPPLIES) ×2 IMPLANT
BAG URO CATCHER STRL LF (MISCELLANEOUS) ×5 IMPLANT
BASKET ZERO TIP NITINOL 2.4FR (BASKET) ×5 IMPLANT
BSKT STON RTRVL ZERO TP 2.4FR (BASKET) ×3
CATH INTERMIT  6FR 70CM (CATHETERS) ×5 IMPLANT
CATH TIEMANN FOLEY 18FR 5CC (CATHETERS) ×2 IMPLANT
CATH URET 5FR 28IN CONE TIP (BALLOONS) ×2
CATH URET 5FR 70CM CONE TIP (BALLOONS) ×3 IMPLANT
CATH URET WHISTLE 6FR (CATHETERS) ×3 IMPLANT
CLOTH BEACON ORANGE TIMEOUT ST (SAFETY) ×5 IMPLANT
COVER SURGICAL LIGHT HANDLE (MISCELLANEOUS) ×5 IMPLANT
GLOVE BIO SURGEON STRL SZ7.5 (GLOVE) ×5 IMPLANT
GLOVE BIOGEL M STRL SZ7.5 (GLOVE) ×5 IMPLANT
GOWN STRL REUS W/TWL XL LVL3 (GOWN DISPOSABLE) ×5 IMPLANT
GUIDEWIRE STR DUAL SENSOR (WIRE) ×5 IMPLANT
LOOP CUT BIPOLAR 24F LRG (ELECTROSURGICAL) IMPLANT
MANIFOLD NEPTUNE II (INSTRUMENTS) ×5 IMPLANT
PACK CYSTO (CUSTOM PROCEDURE TRAY) ×5 IMPLANT
SHEATH ACCESS URETERAL 24CM (SHEATH) ×5 IMPLANT
SHEATH ACCESS URETERAL 38CM (SHEATH) ×5 IMPLANT
TUBING CONNECTING 10 (TUBING) ×4 IMPLANT
TUBING CONNECTING 10' (TUBING) ×1
WIRE COONS/BENSON .038X145CM (WIRE) ×5 IMPLANT

## 2016-06-27 NOTE — Anesthesia Procedure Notes (Signed)
Procedure Name: LMA Insertion Date/Time: 06/27/2016 7:48 AM Performed by: Lissa Morales Pre-anesthesia Checklist: Patient identified, Emergency Drugs available, Suction available and Patient being monitored Patient Re-evaluated:Patient Re-evaluated prior to inductionOxygen Delivery Method: Circle system utilized Preoxygenation: Pre-oxygenation with 100% oxygen Intubation Type: IV induction Ventilation: Mask ventilation without difficulty LMA: LMA with gastric port inserted LMA Size: 5.0 Tube type: Oral Number of attempts: 1 Airway Equipment and Method: Oral airway Placement Confirmation: positive ETCO2 and breath sounds checked- equal and bilateral Tube secured with: Tape Dental Injury: Teeth and Oropharynx as per pre-operative assessment

## 2016-06-27 NOTE — Op Note (Signed)
Preoperative diagnosis: Prostatic urethral polyp, urinary retention, bilateral hydronephrosis Postoperative diagnosis: Same  Procedure: Exam under anesthesia, cystoscopy, bilateral retrograde pyelogram, biopsy of bulb urethra, biopsy of right ureteral orifice, resection of prostatic urethral polyp/TURP, posterior bladder biopsy  Surgeon: Junious Silk  Anesthesia: Gen.  Indication for procedure: 71 year old with a history of recurrent retention and bilateral hydronephrosis. On prior biopsy of the urethra he had marked cytologic atypia and on repeat office cystoscopy a growth in that same area in the prostatic urethra extending potentially through the membranous urethra. He was brought today for the above procedures.    Findings: On exam under anesthesia the patient was uncircumcised but the foreskin was easily retractable and normal. The testicles were descended bilaterally and palpably normal, the prostate on digital rectal exam was about 20 g and very hard. However, all landmarks were preserved.   On cystoscopy the urethra was narrow and there was squamous metaplasia lining much of the penile and bulbar urethra. The bulb urethra was biopsied. He has a history of stricture dilation but there are no strictures now. The prostatic urethra was short but very tight. The ureters were J hook. The trigone and ureteral orifices were in their normal orthotopic position. There was no stone or foreign body in the bladder. The bladder actually looked good, the mucosa appeared normal. There was some posterior cystitis changes from the catheter which I biopsied. The right ureteral orifice did look like it had a growth over it and this was cut and the ureteral orifice appeared normal and very careful coagulation was done around it. There was clear efflux from it at the end of the case. The prostatic urethra polyp was on the right side appeared to extend out toward the membranous urethra and the mucosa up toward the  bladder neck also looked very shaggy.   Left retrograde pyelogram-this outlined a single ureter single collecting system unit. The ureter was J hook but delicate in the collecting system delicate. There were no filling defects strictures or dilation.  Right retrograde pyelogram-this outlined a single ureter single collecting system unit. The ureter was J hook but delicate and the collecting system delicate. There were no filling defects strictures or dilation.  Description of procedure: After consent was obtained patient was brought to the operating room. After adequate anesthesia he was placed in lithotomy position. An exam under anesthesia was performed. He was prepped and draped in the usual sterile fashion. A timeout was performed to confirm the patient and procedure. The cystoscope was passed per urethra and the urethra and bladder carefully inspected with the 30 and 70 lens.  The left ureteral orifice was cannulated with a 6 [place] open-ended catheter and retrograde injection of contrast was performed. Similarly a right retrograde pyelogram was obtained.   I then used the cold cup biopsy forceps to biopsy the bulb urethra. I then placed the loop and did a clean swipe of the right ureteral orifice which revealed the right ureteral orifice and it was widely patent and healthy. Spot cautery around the mucosa was done but not directly on the ureteral orifice except just a touch in the front. Then turned my attention to the prostatic urethral polyp. This was just lateral to the veru and it was resected with the loop. I tried to stay shallow here. The mucosa going up from it toward the bladder neck was a little shaggy also. I went ahead and went posteriorly and tried to get a biopsy of the cystitis changes in the posterior bladder.  As the bladder filled the wall thinned therefore I swapped out to the cold cup sent to shallow bite and sent this as a biopsy. The bladder wall was then fulgurated and  hemostasis was excellent. I then turned my attention back to the prostatic urethra and it was very tight and given the urethral polyp concern it had spread up toward the bladder neck and very hard prostate on exam I went ahead and did an incision of the prostate from the right ureteral orifice and brought this down to the right apex in line with my prior urethral polyp resection. This loosened up the prostate and I thought this was a big part of why he was going into retention. Because it also provided TURP chips for pathology given the very hard prostate on exam and may help him void I went ahead and did an incision of the prostate on the left side also from the left ureteral orifice and brought this down to the left apex. This created a good channel and should he need further therapy hopefully create a channel-like TURP will allow him to void. Hemostasis was excellent and all the chips were evacuated and sent to pathology. I checked the right ureteral orifice and there was clear efflux. I then filled the bladder again and the scope was removed. An 92 French coud catheter was placed and went in easily and drained clear urine. He was awakened and taken to the recovery room in stable condition.  Complications: None  Blood loss: Minimal  Specimens to pathology: #1 bulb urethral biopsy #2 right ureteral orifice #3 prostatic urethral polyp #4 posterior bladder biopsy #5 TURP chips  Drains: 18 French coud catheter  Disposition: Patient stable to PACU

## 2016-06-27 NOTE — Transfer of Care (Signed)
Immediate Anesthesia Transfer of Care Note  Patient: [name]  Procedure(s) Performed: Procedure(s): CYSTOSCOPY WITH BLADDER BIOPSY URETHRAL BIOPSY (N/A) CYSTOSCOPY WITH BILATERAL RETROGRADES (Bilateral) TRANSURETHRAL RESECTION OF THE PROSTATE (TURP)  Patient Location: PACU  Anesthesia Type:General  Level of Consciousness: awake, alert , oriented and patient cooperative  Airway & Oxygen Therapy: Patient Spontanous Breathing and Patient connected to face mask oxygen  Post-op Assessment: Report given to RN, Post -op Vital signs reviewed and stable and Patient moving all extremities X 4  Post vital signs: stable  Last Vitals:  Vitals:   06/27/16 0535  BP: (!) 157/94  Pulse: 64  Resp: (!) 64  Temp: 36.5 C    Last Pain:  Vitals:   06/27/16 0535  TempSrc: Oral      Patients Stated Pain Goal: 4 (62/19/47 1252)  Complications: No apparent anesthesia complications

## 2016-06-27 NOTE — Progress Notes (Signed)
Patient going home with Foley catheter. He came in with one. In reviewing Foley care instructions RN instructed patient the importance of using a large drainage bag at night NOT the leg bag (he said he had kept leg bag on at all times prior to coming in today). Reinforced with patient and son Catalina Antigua to only use leg bag during the day and to have clean hands when changing from leg bag to large drainage bag. Showed them how to cover with cap (sent home with patient) the tube not in use. All foley care instructions reviewed with patient.  D/c instructions reviewed with patient and son Catalina Antigua. Home with son Catalina Antigua.

## 2016-06-27 NOTE — Anesthesia Postprocedure Evaluation (Signed)
Anesthesia Post Note  Patient: [name]  Procedure(s) Performed: Procedure(s) (LRB): CYSTOSCOPY WITH BLADDER BIOPSY URETHRAL BIOPSY (N/A) CYSTOSCOPY WITH BILATERAL RETROGRADES (Bilateral) TRANSURETHRAL RESECTION OF THE PROSTATE (TURP)     Patient location during evaluation: PACU Anesthesia Type: General Level of consciousness: awake and alert Pain management: pain level controlled Vital Signs Assessment: post-procedure vital signs reviewed and stable Respiratory status: spontaneous breathing, nonlabored ventilation, respiratory function stable and patient connected to nasal cannula oxygen Cardiovascular status: blood pressure returned to baseline and stable Postop Assessment: no signs of nausea or vomiting Anesthetic complications: no    Last Vitals:  Vitals:   06/27/16 0945 06/27/16 0957  BP: 132/85 136/88  Pulse: 61 60  Resp: 12 13  Temp: 36.3 C     Last Pain:  Vitals:   06/27/16 0915  TempSrc:   PainSc: 0-No pain                 Lolly Glaus EDWARD

## 2016-06-27 NOTE — H&P (Signed)
Urology Admission H&P  Chief Complaint: retention, hydro, urethral atypia   History of Present Illness: 71 yo AAM presented Jan 2017 with retention and hydro thought to be from a stricture. Foley was placed and f/u cysto/bbx/RGP revealed atypia of urethra. He went back into retention Nov 2017. He has had a foley since and needs a f/u bx of the urethra and repeat cysto/RGP. He is well. Foley is draining normally. Urine clear. No complaints.   Past Medical History:  Diagnosis Date  . Acute MI, inferior wall ([place]) 1966  . Amputation finger    right first finger top portion age 41  . CHF (congestive heart failure) ([place])   . Coronary artery disease    severe 3 vessel   . Fall   . Foley catheter in place   . H/O hyperkalemia   . H/O thrombocytosis   . Hip fracture, left ([place])    11/2014  . History of blood transfusion   . History of metabolic acidosis   . History of tobacco abuse   . Hypertension   . Obstructive uropathy   . Pneumonia    childhood   . S/P VSD repair    09/2009  . Trichomonas infection   . Urethral stricture due to infective diseases classified elsewhere   . Urinary tract infection    Past Surgical History:  Procedure Laterality Date  . CORONARY ARTERY BYPASS GRAFT     times 4  . CYSTOSCOPY W/ RETROGRADES N/A 04/03/2015   Procedure:  BLADDER BIOPSIES;  Surgeon: Festus Aloe, MD;  Location: [facility];  Service: Urology;  Laterality: N/A;  . CYSTOSCOPY WITH URETHRAL DILATATION N/A 04/03/2015   Procedure: CYSTOSCOPY WITH BILATERAL RETROGRADES;  Surgeon: Festus Aloe, MD;  Location: [facility];  Service: Urology;  Laterality: N/A;  . flexible cystoscopy    . repair of post infarction posterior ventricular septal defect      Home Medications:  Prescriptions Prior to Admission  Medication Sig Dispense Refill Last Dose  . carvedilol (COREG) 12.5 MG tablet take 1 tablet by mouth twice a day with meals 30 tablet 0 06/26/2016 at 1700  . rosuvastatin (CRESTOR) 40 MG  tablet take 1 tablet by mouth once daily 15 tablet 0 06/26/2016 at 1700  . amLODipine (NORVASC) 10 MG tablet take 1/2 tablet by mouth once daily 30 tablet 11  at 1700   Allergies:  Allergies  Allergen Reactions  . Chlorhexidine Gluconate Other (See Comments)    Red skin and flaked skin     History reviewed. No pertinent family history. Social History:  reports that he quit smoking about 6 years ago. His smoking use included Cigarettes. He has a 67.50 pack-year smoking history. He has never used smokeless tobacco. He reports that he does not drink alcohol or use drugs.  Review of Systems  Endo/Heme/Allergies: Environmental allergies:    All other systems reviewed and are negative.   Physical Exam:  Vital signs in last 24 hours: Temp:  [97.7 F (36.5 C)] 97.7 F (36.5 C) (06/15 0535) Pulse Rate:  [64] 64 (06/15 0535) Resp:  [64] 64 (06/15 0535) BP: (157)/(94) 157/94 (06/15 0535) SpO2:  [100 %] 100 % (06/15 0535) Weight:  [92.1 kg (203 lb)-92.2 kg (203 lb 3.2 oz)] 92.1 kg (203 lb) (06/15 0603) Physical Exam NAD Alert and O x 3 CV - RRR Lungs - reg effort and depth abd - soft NT Ext - no CCE   Laboratory Data:  Results for orders placed or performed during  the hospital encounter of 06/27/16 (from the past 24 hour(s))  Basic metabolic panel     Status: Abnormal   Collection Time: 06/27/16  5:40 AM  Result Value Ref Range   Sodium 138 135 - 145 mmol/L   Potassium 4.4 3.5 - 5.1 mmol/L   Chloride 111 101 - 111 mmol/L   CO2 21 (L) 22 - 32 mmol/L   Glucose, Bld 107 (H) 65 - 99 mg/dL   BUN 33 (H) 6 - 20 mg/dL   Creatinine, Ser 2.28 (H) 0.61 - 1.24 mg/dL   Calcium 9.5 8.9 - 10.3 mg/dL   GFR calc non Af Amer 27 (L) >60 mL/min   GFR calc Af Amer 31 (L) >60 mL/min   Anion gap 6 5 - 15  Hemoglobin     Status: Abnormal   Collection Time: 06/27/16  5:40 AM  Result Value Ref Range   Hemoglobin 11.2 (L) 13.0 - 17.0 g/dL   No results found for this or any previous visit (from the  past 240 hour(s)). Creatinine:  Recent Labs  06/27/16 0540  CREATININE 2.28*    Impression/Assessment:  Urethral atypia, retention, bilateral hydronephrosis  Plan:  I discussed with the patient the nature, potential benefits, risks and alternatives to cystoscopy, bladder and urethral biopsy, retrogrades, including side effects of the proposed treatment, the likelihood of the patient achieving the goals of the procedure, and any potential problems that might occur during the procedure or recuperation. All questions answered. Patient elects to proceed.   Ayaana Biondo 06/27/2016, 7:27 AM

## 2016-06-27 NOTE — Discharge Instructions (Signed)
Indwelling Urinary Catheter Care, Adult  Take good care of your catheter to keep it working and to prevent problems.  How to wear your catheter  Attach your catheter to your leg with tape (adhesive tape) or a leg strap. Make sure it is not too tight. If you use tape, remove any bits of tape that are already on the catheter.  How to wear a drainage bag  You should have:   A large overnight bag.   A small leg bag.    Overnight Bag  You may wear the overnight bag at any time. Always keep the bag below the level of your bladder but off the floor. When you sleep, put a clean plastic bag in a wastebasket. Then hang the bag inside the wastebasket.  Leg Bag  Never wear the leg bag at night. Always wear the leg bag below your knee. Keep the leg bag secure with a leg strap or tape.  How to care for your skin   Clean the skin around the catheter at least once every day.   Shower every day. Do not take baths.   Put creams, lotions, or ointments on your genital area only as told by your doctor.   Do not use powders, sprays, or lotions on your genital area.  How to clean your catheter and your skin  1. Wash your hands with soap and water.  2. Wet a washcloth in warm water and gentle (mild) soap.  3. Use the washcloth to clean the skin where the catheter enters your body. Clean downward and wipe away from the catheter in small circles. Do not wipe toward the catheter.  4. Pat the area dry with a clean towel. Make sure to clean off all soap.  How to care for your drainage bags  Empty your drainage bag when it is ?- full or at least 2-3 times a day. Replace your drainage bag once a month or sooner if it starts to smell bad or look dirty. Do not clean your drainage bag unless told by your doctor.  Emptying a drainage bag    Supplies Needed   Rubbing alcohol.   Gauze pad or cotton ball.   Tape or a leg strap.    Steps  1. Wash your hands with soap and water.  2. Separate (detach) the bag from your leg.  3. Hold the bag over  the toilet or a clean container. Keep the bag below your hips and bladder. This stops pee (urine) from going back into the tube.  4. Open the pour spout at the bottom of the bag.  5. Empty the pee into the toilet or container. Do not let the pour spout touch any surface.  6. Put rubbing alcohol on a gauze pad or cotton ball.  7. Use the gauze pad or cotton ball to clean the pour spout.  8. Close the pour spout.  9. Attach the bag to your leg with tape or a leg strap.  10. Wash your hands.    Changing a drainage bag  Supplies Needed   Alcohol wipes.   A clean drainage bag.   Adhesive tape or a leg strap.    Steps  1. Wash your hands with soap and water.  2. Separate the dirty bag from your leg.  3. Pinch the rubber catheter with your fingers so that pee does not spill out.  4. Separate the catheter tube from the drainage tube where these tubes connect (at the   connection valve). Do not let the tubes touch any surface.  5. Clean the end of the catheter tube with an alcohol wipe. Use a different alcohol wipe to clean the end of the drainage tube.  6. Connect the catheter tube to the drainage tube of the clean bag.  7. Attach the new bag to the leg with adhesive tape or a leg strap.  8. Wash your hands.    How to prevent infection and other problems   Never pull on your catheter or try to remove it. Pulling can damage tissue in your body.   Always wash your hands before and after touching your catheter.   If a leg strap gets wet, replace it with a dry one.   Drink enough fluids to keep your pee clear or pale yellow, or as told by your doctor.   Do not let the drainage bag or tubing touch the floor.   Wear cotton underwear.   If you are male, wipe from front to back after you poop (have a bowel movement).   Check on the catheter often to make sure it works and the tubing is not twisted.  Get help if:   Your pee is cloudy.   Your pee smells unusually bad.   Your pee is not draining into the bag.   Your  tube gets clogged.   Your catheter starts to leak.   Your bladder feels full.  Get help right away if:   You have redness, swelling, or pain where the catheter enters your body.   You have fluid, pus, or a bad smell coming from the area where the catheter enters your body.   The area where the catheter enters your body feels warm.   You have a fever.   You have pain in your:  ? Stomach (abdomen).  ? Legs.  ? Lower back.  ? Bladder.   You see blood fill the catheter.   Your pee is pink or red.   You feel sick to your stomach (nauseous).   You throw up (vomit).   You have chills.   Your catheter gets pulled out.  This information is not intended to replace advice given to you by your health care provider. Make sure you discuss any questions you have with your health care provider.  Document Released: 04/26/2012 Document Revised: 11/28/2015 Document Reviewed: 06/14/2013  [employer]vier Interactive Patient Education  2018 [employer]

## 2016-06-28 ENCOUNTER — Encounter (HOSPITAL_COMMUNITY): Payer: Self-pay | Admitting: Urology

## 2016-07-01 ENCOUNTER — Other Ambulatory Visit: Payer: Self-pay | Admitting: Urology

## 2016-07-01 DIAGNOSIS — C61 Malignant neoplasm of prostate: Secondary | ICD-10-CM

## 2016-07-06 ENCOUNTER — Other Ambulatory Visit: Payer: Self-pay | Admitting: Cardiology

## 2016-07-07 NOTE — Telephone Encounter (Signed)
Rx(s) sent to pharmacy electronically.  

## 2016-07-08 ENCOUNTER — Encounter (HOSPITAL_COMMUNITY)
Admission: RE | Admit: 2016-07-08 | Discharge: 2016-07-08 | Disposition: A | Discharge status: Home / Self Care | Payer: Medicare Other | Source: Ambulatory Visit | Attending: Urology | Admitting: Urology

## 2016-07-08 DIAGNOSIS — C61 Malignant neoplasm of prostate: Secondary | ICD-10-CM | POA: Insufficient documentation

## 2016-07-08 DIAGNOSIS — N1330 Unspecified hydronephrosis: Secondary | ICD-10-CM | POA: Diagnosis not present

## 2016-07-08 DIAGNOSIS — N369 Urethral disorder, unspecified: Secondary | ICD-10-CM | POA: Diagnosis not present

## 2016-07-08 MED ORDER — TECHNETIUM TC 99M MEDRONATE IV KIT
20.4000 | PACK | Freq: Once | INTRAVENOUS | Status: AC | PRN
  Administered 2016-07-08: 20.4 via INTRAVENOUS

## 2016-07-11 DIAGNOSIS — C61 Malignant neoplasm of prostate: Secondary | ICD-10-CM | POA: Diagnosis not present

## 2016-07-14 ENCOUNTER — Other Ambulatory Visit: Payer: Self-pay | Admitting: Urology

## 2016-07-14 DIAGNOSIS — K869 Disease of pancreas, unspecified: Secondary | ICD-10-CM

## 2016-07-17 ENCOUNTER — Telehealth: Payer: Self-pay | Admitting: Medical Oncology

## 2016-07-17 ENCOUNTER — Encounter: Payer: Self-pay | Admitting: Radiation Oncology

## 2016-07-17 NOTE — Telephone Encounter (Signed)
Left a message requesting a return call to discuss referral to the [facility] 07/25/16.

## 2016-07-18 ENCOUNTER — Encounter: Payer: Self-pay | Admitting: Medical Oncology

## 2016-07-22 ENCOUNTER — Encounter: Payer: Self-pay | Admitting: Radiation Oncology

## 2016-07-22 NOTE — Progress Notes (Signed)
GU Location of Tumor / Histology: prostatic adenocarcinoma   If Prostate Cancer, Gleason Score is (5 + 5) and PSA is (17.50) on 07/11/16  [name] presented with urinary retention and underwent a TURP and bladder biopsy.    Past/Anticipated interventions by urology, if any: TURP, bladder biopsy, bone scan (negative), discussion of ADT and IMRT, referral to [facility], pancreas MRI (scheduled for 07/28/16)  Past/Anticipated interventions by medical oncology, if any: no  Weight changes, if any: no  Bowel/Bladder complaints, if any: Unknown. Patient did not complete PMDC paperwork as requested.   Nausea/Vomiting, if any: no  Pain issues, if any:  no  SAFETY ISSUES:  Prior radiation? no  Pacemaker/ICD? o  Possible current pregnancy? no  Is the patient on methotrexate? no  Current Complaints / other details:  71 year old male. Retired. Divorced.

## 2016-07-24 ENCOUNTER — Telehealth: Payer: Self-pay | Admitting: Medical Oncology

## 2016-07-24 NOTE — Progress Notes (Signed)
I called pt to introduce myself as the Prostate Nurse Navigator and the Coordinator of the Prostate [place].  1. I confirmed with the patient he is aware of his referral to the clinic 07/25/16 arriving at 8 am.   2. I discussed the format of the clinic and the physicians he will be seeing that day.  3. I discussed where the clinic is located and how to contact me.  4. I confirmed his address and informed him I would be mailing a packet of information and forms to be completed. I asked him to bring them with him the day of his appointment.   He voiced understanding of the above. I asked him to call me if he has any questions or concerns regarding his appointments or the forms he needs to complete.

## 2016-07-24 NOTE — Telephone Encounter (Signed)
Spoke with [name] to confirm appointment for Pro[facility] 07/25/16 arriving at 8 am. We reviewed how to register and our location. I reminded him to bring his completed medical forms. He voiced understanding.

## 2016-07-25 ENCOUNTER — Encounter: Payer: Self-pay | Admitting: General Practice

## 2016-07-25 ENCOUNTER — Ambulatory Visit
Admission: RE | Admit: 2016-07-25 | Discharge: 2016-07-25 | Disposition: A | Discharge status: Home / Self Care | Payer: Medicare Other | Source: Ambulatory Visit | Attending: Radiation Oncology | Admitting: Radiation Oncology

## 2016-07-25 ENCOUNTER — Encounter: Payer: Self-pay | Admitting: Medical Oncology

## 2016-07-25 DIAGNOSIS — E440 Moderate protein-calorie malnutrition: Secondary | ICD-10-CM | POA: Insufficient documentation

## 2016-07-25 DIAGNOSIS — K8689 Other specified diseases of pancreas: Secondary | ICD-10-CM | POA: Diagnosis not present

## 2016-07-25 DIAGNOSIS — D473 Essential (hemorrhagic) thrombocythemia: Secondary | ICD-10-CM

## 2016-07-25 DIAGNOSIS — D75839 Thrombocytosis, unspecified: Secondary | ICD-10-CM

## 2016-07-25 DIAGNOSIS — I5022 Chronic systolic (congestive) heart failure: Secondary | ICD-10-CM

## 2016-07-25 DIAGNOSIS — C61 Malignant neoplasm of prostate: Secondary | ICD-10-CM | POA: Insufficient documentation

## 2016-07-25 DIAGNOSIS — R972 Elevated prostate specific antigen [PSA]: Secondary | ICD-10-CM | POA: Diagnosis not present

## 2016-07-25 HISTORY — DX: Malignant neoplasm of prostate: C61

## 2016-07-25 NOTE — Progress Notes (Signed)
Radiation Oncology         [phone]) [phone] ________________________________  Multidisciplinary Prostate Cancer Clinic  Initial Outpatient Radiation Oncology Consultation  Name: [name] MRN: [id number]  Date: 07/25/2016  DOB: 1945-07-21  VV:[id number], No Pcp Per  Raynelle Bring, MD   REFERRING PHYSICIAN: Raynelle Bring, MD  DIAGNOSIS: 71 y.o. gentleman with locally advanced adenocarcinoma of the prostate with a Gleason's score of 5+5 and a PSA of 17.5    ICD-10-CM   1. Malignant neoplasm of prostate ([facility]) C61   2. Moderate protein-calorie malnutrition (HCC)Chronic E44.0   3. Thrombocytosis (HCC)Chronic D47.3   4. Chronic systolic CHF (congestive heart failure) (HCC)Chronic I50.22     HISTORY OF PRESENT ILLNESS::[name] is a 71 y.o. gentleman.  He is well established with [facility] due to BPH with urinary retention, previously followed by Dr. Janice Norrie and currently managed by Dr. Junious Silk.  He had a recurrent episode of AUR in 06/2016 and DRE at that time revealed some induration.  The patient proceeded with TURP with prostate biopsy on 06/27/2016.  Pathology returned Gleason 5+5 disease in all TUR chips with local invasion into the bladder detrusor muscle. PSA on 07/11/16 was 17.5.  He had CT and Bone scan on 07/08/16.  Bone scan was negative for any osseous metastatic disease.  CT scan showed a single pathologically enlarged pelvic lymph node as well as dilation of the main pancreatic duct without discrete pancreatic mass.   The patient reviewed the surgical pathology results with his urologist and he has kindly been referred today to the multidisciplinary prostate cancer clinic for presentation of pathology and radiology studies in our conference for discussion of potential radiation treatment options and clinical evaluation.  PREVIOUS RADIATION THERAPY: No  PAST MEDICAL HISTORY:  has a past medical history of Acute MI, inferior wall ([employer]) (1966); Amputation finger; CHF  (congestive heart failure) ([place]); Coronary artery disease; Fall; Foley catheter in place; H/O hyperkalemia; H/O thrombocytosis; Hip fracture, left ([place]); History of blood transfusion; History of metabolic acidosis; History of tobacco abuse; Hypertension; Obstructive uropathy; Pneumonia; Prostate cancer ([place]); S/P VSD repair; Trichomonas infection; Urethral stricture due to infective diseases classified elsewhere; and Urinary tract infection.    PAST SURGICAL HISTORY: Past Surgical History:  Procedure Laterality Date  . CORONARY ARTERY BYPASS GRAFT     times 4  . CYSTOSCOPY W/ RETROGRADES N/A 04/03/2015   Procedure:  BLADDER BIOPSIES;  Surgeon: Festus Aloe, MD;  Location: [facility];  Service: Urology;  Laterality: N/A;  . CYSTOSCOPY W/ RETROGRADES Bilateral 06/27/2016   Procedure: CYSTOSCOPY WITH BILATERAL RETROGRADES;  Surgeon: Festus Aloe, MD;  Location: [facility];  Service: Urology;  Laterality: Bilateral;  . CYSTOSCOPY WITH BIOPSY N/A 06/27/2016   Procedure: CYSTOSCOPY WITH BLADDER BIOPSY URETHRAL BIOPSY;  Surgeon: Festus Aloe, MD;  Location: [facility];  Service: Urology;  Laterality: N/A;  . CYSTOSCOPY WITH URETHRAL DILATATION N/A 04/03/2015   Procedure: CYSTOSCOPY WITH BILATERAL RETROGRADES;  Surgeon: Festus Aloe, MD;  Location: [facility];  Service: Urology;  Laterality: N/A;  . flexible cystoscopy    . repair of post infarction posterior ventricular septal defect    . TRANSURETHRAL RESECTION OF PROSTATE  06/27/2016   Procedure: TRANSURETHRAL RESECTION OF THE PROSTATE (TURP);  Surgeon: Festus Aloe, MD;  Location: [facility];  Service: Urology;;    FAMILY HISTORY: family history is not on file.  SOCIAL HISTORY:  reports that he quit smoking about 6 years ago. His smoking use included Cigarettes. He has a 67.50 pack-year  smoking history. He has never used smokeless tobacco. He reports that he does not drink alcohol or use drugs.  ALLERGIES: Chlorhexidine gluconate  MEDICATIONS:    Current Outpatient Prescriptions  Medication Sig Dispense Refill  . amLODipine (NORVASC) 10 MG tablet take 1/2 tablet by mouth once daily 30 tablet 11  . carvedilol (COREG) 12.5 MG tablet Take 1 tablet (12.5 mg total) by mouth 2 (two) times daily with a meal. <PLEASE MAKE APPOINTMENT FOR REFILLS> 30 tablet 0  . rosuvastatin (CRESTOR) 40 MG tablet Take 1 tablet (40 mg total) by mouth daily. <PLEASE MAKE APPOINTMENT FOR REFILLS> 15 tablet 0   No current facility-administered medications for this encounter.    Facility-Administered Medications Ordered in Other Encounters  Medication Dose Route Frequency Provider Last Rate Last Dose  . ceFAZolin (ANCEF) IVPB 2g/100 mL premix  2 g Intravenous 30 min Pre-Op Festus Aloe, MD        REVIEW OF SYSTEMS:  On review of systems, the patient reports that he is doing well overall. He denies any chest pain, shortness of breath, cough, fevers, chills, night sweats, unintended weight changes. He denies any bowel disturbances, and denies abdominal pain, nausea or vomiting. He denies any new musculoskeletal or joint aches or pains. He has a history of severe bladder outlet obstruction with intermittent episodes of AUR.  He recently underwent TURP on 06/27/16 and still has an indwelling foley catheter at present.  He admits that he was scheduled for a voiding trial with urology 1 week postop but he declined having the catheter removed because he was going out of town for the July 4th holiday and did not want to risk going into retention while he was out of town.  He has not called to schedule a follow up and states that he "thought Dr. Lyndal Rainbow nurse was gonna call me".  He is not currently sexually active. A complete review of systems is obtained and is otherwise negative.   PHYSICAL EXAM:  Wt Readings from Last 3 Encounters:  07/25/16 203 lb 12.8 oz (92.4 kg)  06/27/16 203 lb (92.1 kg)  05/09/16 203 lb 12.8 oz (92.4 kg)   Temp Readings from Last 3  Encounters:  06/27/16 97.4 F (36.3 C)  05/09/16 97.6 F (36.4 C) (Oral)  12/05/15 97.8 F (36.6 C) (Oral)   BP Readings from Last 3 Encounters:  07/25/16 (!) 153/102  06/27/16 139/85  05/09/16 (!) 156/96   Pulse Readings from Last 3 Encounters:  07/25/16 63  06/27/16 61  05/09/16 66   Pain Assessment Pain Score: 0-No pain/10  In general this is a well appearing african [place] male in no acute distress. He is alert and oriented x4 and appropriate throughout the examination. HEENT reveals that the patient is normocephalic, atraumatic. EOMs are intact. PERRLA. Skin is intact without any evidence of gross lesions. Cardiovascular exam reveals a regular rate and rhythm, no clicks rubs or murmurs are auscultated. Chest is clear to auscultation bilaterally. Lymphatic assessment is performed and does not reveal any adenopathy in the cervical, supraclavicular, axillary, or inguinal chains. Abdomen has active bowel sounds in all quadrants and is intact. The abdomen is soft, non tender, non distended. Lower extremities are negative for pretibial pitting edema, deep calf tenderness, cyanosis or clubbing.  KPS = 90  100 - Normal; no complaints; no evidence of disease. 90   - Able to carry on normal activity; minor signs or symptoms of disease. 80   - Normal activity with effort; some signs  or symptoms of disease. 72   - Cares for self; unable to carry on normal activity or to do active work. 60   - Requires occasional assistance, but is able to care for most of his personal needs. 50   - Requires considerable assistance and frequent medical care. 21   - Disabled; requires special care and assistance. 32   - Severely disabled; hospital admission is indicated although death not imminent. 2   - Very sick; hospital admission necessary; active supportive treatment necessary. 10   - Moribund; fatal processes progressing rapidly. 0     - Dead  Karnofsky DA, Abelmann [place], Craver LS and Burchenal [facility]  [phone]) The use of the nitrogen mustards in the palliative treatment of carcinoma: with particular reference to bronchogenic carcinoma Cancer 1 [phone]   LABORATORY DATA:  Lab Results  Component Value Date   WBC 6.4 05/09/2016   HGB 11.2 (L) 06/27/2016   HCT 33.9 (L) 05/09/2016   MCV 82.1 05/09/2016   PLT 333 05/09/2016   Lab Results  Component Value Date   NA 138 06/27/2016   K 4.4 06/27/2016   CL 111 06/27/2016   CO2 21 (L) 06/27/2016   Lab Results  Component Value Date   ALT 12 (L) 05/09/2016   AST 15 05/09/2016   ALKPHOS 103 05/09/2016   BILITOT 0.2 (L) 05/09/2016     RADIOGRAPHY: Nm Bone Scan Whole Body  Result Date: 07/08/2016 CLINICAL DATA:  New diagnosis of prostate malignancy. EXAM: NUCLEAR MEDICINE WHOLE BODY BONE SCAN TECHNIQUE: Whole body anterior and posterior images were obtained approximately 3 hours after intravenous injection of radiopharmaceutical. RADIOPHARMACEUTICALS:  20.4 mCi Technetium-35m MDP IV COMPARISON:  None in PACs FINDINGS: There is adequate uptake of the radiopharmaceutical by the skeleton. There is adequate soft tissue clearance and renal activity. Uptake within the calvarium, spine, ribs, pelvis, and upper extremities is within the limits of normal. There is mildly increased uptake within both knees and ankles compatible with degenerative change. IMPRESSION: There are no findings suspicious for metastatic disease to the skeleton. Electronically Signed   By: David  Martinique M.D.   On: 07/08/2016 15:25   Dg C-arm 1-60 Min-no Report  Result Date: 06/27/2016 Fluoroscopy was utilized by the requesting physician.  No radiographic interpretation.      IMPRESSION/PLAN: 71 y.o. gentleman with a high risk, stage IV, pT4N1 adenocarcinoma of the prostate with a PSA of 17.5 and a Gleason score of 5+5.   [place], we the patient's workup and outlines the nature of prostate cancer in this setting. The patient's T stage, Gleason's score, and PSA put him into the high  risk group. Accordingly, he is eligible for a variety of potential treatment options including 8 weeks of external beam radiotherapy in combination with LT-ADT (2 years) or prostatectomy. We discussed the available radiation techniques, and focused on the details and logistics and delivery. The patient is not an ideal candidate for brachytherapy boost due to recent TURP procedure.  We would recommend starting ADT now with plans to begin an 8 week course of radiotherapy approximately 2 months thereafter.   We discussed and outlined the risks, benefits, short and long-term effects associated with radiotherapy and compared and contrasted these with prostatectomy. We also detailed the role of ADT in the treatment of high risk prostate cancer and outlined the associated side effects that could be expected with this therapy.  At the end of the conversation the patient is interested in moving forward with the initiation of  LT-ADT followed by 8 weeks of external beam radiotherapy to begin approximately two months after the start of ADT. He has not received his first Lupron injection and has not had placement of gold fiducial markers. We will contact [facility] to make arrangements for fiducial marker placement prior to simulation. He will be scheduled for simulation approximately 8 weeks after the start of ADT. We will share our discussion with Dr. Junious Silk and move forward with scheduling an appointment to begin ADT within the next 1-2 weeks and we will try to coordinate this to occur in conjunction with a voiding trial for foley catheter removal.  We will also arrange for an appointment in approximately 6 weeks for placement of 3 gold fiducial markers into the prostate to proceed with IMRT in September 2018.  He is agreeable to proceeding with further evaluation of the dilated pancreatic duct noted on recent CT imaging.  He is scheduled for MRI abdomen on Monday 07/28/16.  Dr. Alinda Money will help facilitate a GI  consultation with Dr. Oretha Caprice for further evaluation and management.     We spent 60 minutes face to face with the patient and more than 50% of that time was spent in counseling and/or coordination of care.      Nicholos Johns, PA-C    Tyler Pita, MD  [facility] Direct Dial: [phone]  Fax: [phone] [email or web address]  Skype  LinkedIn

## 2016-07-25 NOTE — Progress Notes (Signed)
[place] Psychosocial Distress Screening Spiritual Care  Met with [name] in [facility] to introduce [facility] team/resources, reviewing distress screen per protocol.  The patient scored a 0 on the Psychosocial Distress Thermometer which indicates minimal distress. Also assessed for distress and other psychosocial needs.   ONCBCN DISTRESS SCREENING 07/25/2016  Screening Type Initial Screening  Distress experienced in past week (1-10) 0  Practical problem type Transportation  Referral to support programs Yes   Per pt, he has no distress and no needs other than questions about how he will manage driving while undergoing xrt; Nurse Navigator [name]/RN plans to address ability to drive and availability of financial support (gas cards, etc) as needed to assist with this.   Follow up needed: No.  [name] has full packet of [place] and knows to contact Support Team anytime with questions or other needs.   [place], [place], [facility] Pager [phone] Voicemail [phone]

## 2016-07-25 NOTE — Progress Notes (Signed)
                               Care Plan Summary  Name: [name] DOB: 07-17-1945   Your Medical Team:   Urologist -  Dr. Raynelle Bring, [facility]  Radiation Oncologist - Dr. Tyler Pita, [facility]   Medical Oncologist - Dr. Zola Button, [place]  Recommendations: 1) Androgen Deprivation ( hormone injections) 2) Radiation  * These recommendations are based on information available as of today's consult.      Recommendations may change depending on the results of further tests or exams.  Next Steps: 1) MRI 07/28/16 to further evaluate pancreas 2) Dr. Junious Silk office will schedule voiding trial and catheter removal 3) Dr. Junious Silk' s office will schedule hormone injection 4) Dr. Johny Shears office will schedule raditaion  When appointments need to be scheduled, you will be contacted by [facility] and/or [facility].  Questions?  Please do not hesitate to call Cira Rue, RN, BSN, OCN at [phone] any questions or concerns.  Shirlean Mylar is your Oncology Nurse Navigator and is available to assist you while you're receiving your medical care at [facility].  Patient given business cards for all team members and a copy of "Psychologist, clinical.   [name] states he has a car and drives but if he receives radiation he is worried about the cost of gas. I discussed with him that we have resources here at [facility] that assist him. When he is scheduled for CT simulation I will have him meet with the financial counselor for assistance. He voiced understanding.

## 2016-07-25 NOTE — Consult Note (Signed)
[facility]iplinary [facility]     07/25/2016   --------------------------------------------------------------------------------   [name]  MRN: [id number]  PRIMARY CARE:    DOB: Dec 17, 1945, 71 year old Male  REFERRING:  Ammie Dalton, NP  SSN: -[id number]  PROVIDER:  Festus Aloe, M.D.    TREATING:  Raynelle Bring, M.D.    LOCATION:  [facility] [phone]   --------------------------------------------------------------------------------   CC/HPI: CC: Prostate Cancer   Physician requesting consult: Dr. Eda Keys  PCP:  Location of consult: [facility]   [name] is a 71 year old gentleman with a history of bladder outlet obstruction who initially developed urinary retention in January 2017 and has been followed by Dr. Junious Silk. He has passed voiding trials but presented again to the hospital in November 2017 when he was noted to have acute renal failure with a SCr of 9.4 (baseline is 2.0) and bilateral hydronephrosis. His renal function returned to baseline after catheter placement. He has been very non-compliant with follow up and recommendations but has had a catheter in place since November 2017 that has been periodically changed when he has been willing to come in for follow up. He also was noted to have an abnormal DRE. Finally, he underwent further evaluation in June 2018 when he underwent cystoscopy, channel TUR/biopsy of urothelial abnormality of bladder neck and prostatic urethra, and bilateral retrograde pyelography on 06/27/16. Pathology from his procedure indicated Gleason 5+5=10 adenocarcinoma in 80% of the TURP chips with confirmation of local extension of prostate cancer into the bladder detrusor muscle indicated locally advanced disease. He has since had a voiding trial but currently has a catheter back indwelling.   He apparently is scheduled for an MRCP on Monday for further evaluation of his  pancreatic duct dilation and possible pancreatic mass.   Family history: None.   Imaging studies:  Bone scan (07/08/16): Negative for metastatic disease.  CT of abdomen and pelvis (07/08/16): Dilation of pancreatic duct concerning for obstruction (MRI/MRCP recommended), numerous non-pathologically enlarged pelvic and retroperitoneal lymph nodes, bilateral small renal lesions (probable cysts but incompletely characterized on non-contrast imaging). There is 1 concerning left-sided pelvic lymph node that appears pathologically enlarged it was not mentioned on his report initially.   PMH: He has a history of CAD s/p CABG, hypertension, CKD (baseline Cr 2.0), and hyperlipidemia.  PSH: No abdominal surgeries.   TNM stage: cT4 N0 M0  PSA: 17.5 (checked 2 weeks after TURP)  Gleason score: 5+5=10  Biopsy (06/27/16): 80% of channel TUR chips with evidence of bladder wall invasion  Prostate volume: Unknown   Urinary function: He has an indwelling catheter currently.  Erectile function: He has not recently been sexually active considering his indwelling catheter.     ALLERGIES: No Allergies    MEDICATIONS: Amlodipine Besylate 10 mg tablet Oral  Aspirin Ec 81 mg tablet, delayed release Oral  Carvedilol 12.5 MG Oral Tablet Oral  Crestor 40 MG Oral Tablet Oral     GU PSH: Cysto Bladder Ureth Biopsy - 06/27/2016 Cysto Dilate Stricture (M or F) - 2012, 2011 Cysto Fulgurate < 0.5 cm - 04/10/2015 Cystoscopy - 04/21/2016 Cystoscopy TURP - 06/27/2016      PSH Notes: Cystoscopy With Fulguration Minor Lesion (Under 39mm), Heart Surgery, Cystoscopy For Urethral Stricture, Cystoscopy For Urethral Stricture   NON-GU PSH: None   GU PMH: Prostate Cancer - 07/15/2016, - 07/11/2016 Hydronephrosis Unspec - 04/21/2016, Hydronephrosis, - 02/16/2015 Urethral disorders, Unspec (Stable) -  04/21/2016, Lesion of urethra, - 05/03/2015 Urinary Retention - 04/21/2016 Meatal stenosis (other urethral stricture), Bulbous urethral  stricture - 05/03/2015    NON-GU PMH: Disease of pancreas, unspecified - 07/11/2016 Bacteriuria, Bacteriuria, asymptomatic - 04/02/2015 Encounter for general adult medical examination without abnormal findings, Encounter for preventive health examination - 03/29/2015 Myocardial Infarction, History of myocardial infarction - 02/16/2015 Personal history of other diseases of the circulatory system, History of hypertension - 2014    FAMILY HISTORY: Family Health Status Number - Runs In Family Father Deceased At Age91 ___ - Runs In Family Mother Deceased At Age 46 from diabetic complicati - Runs In Family   SOCIAL HISTORY: Marital Status: Single Current Smoking Status: Patient does not smoke anymore.   Tobacco Use Assessment Completed: Used Tobacco in last 30 days? Patient's occupation is/was retired.     Notes: Marital History - Divorced, Caffeine Use, Alcohol Use   REVIEW OF SYSTEMS:    GU Review Male:   Patient denies hard to postpone urination, stream starts and stops, leakage of urine, burning/ pain with urination, trouble starting your streams, have to strain to urinate , get up at night to urinate, and frequent urination.  Gastrointestinal (Upper):   Patient denies nausea and vomiting.  Gastrointestinal (Lower):   Patient denies diarrhea and constipation.  Constitutional:   Patient denies fever, night sweats, weight loss, and fatigue.  Skin:   Patient denies skin rash/ lesion and itching.  Eyes:   Patient denies blurred vision and double vision.  Ears/ Nose/ Throat:   Patient denies sore throat and sinus problems.  Hematologic/Lymphatic:   Patient denies swollen glands and easy bruising.  Cardiovascular:   Patient denies leg swelling and chest pains.  Respiratory:   Patient denies cough and shortness of breath.  Endocrine:   Patient denies excessive thirst.  Musculoskeletal:   Patient denies back pain and joint pain.  Neurological:   Patient denies headaches and dizziness.   Psychologic:   Patient denies depression and anxiety.   VITAL SIGNS: None   MULTI-SYSTEM PHYSICAL EXAMINATION:    Constitutional: Well-nourished. No physical deformities. Normally developed. Good grooming.     PAST DATA REVIEWED:  Source Of History:  Patient  Lab Test Review:   PSA  Records Review:   Pathology Reports  X-Ray Review: C.T. Abdomen/Pelvis: Reviewed Films.  Bone Scan: Reviewed Films.     07/11/16  PSA  Total PSA 17.50 ng/mL  Notes Performed By: [facility] [place], [place], [place], [address]; Lab Dir: Vedia Pereyra, [phone] Confirmed By Repeat Analysis Methodology: SIEMENS IMMULITE (Immuno-chemiluminescense)      PROCEDURES: None   ASSESSMENT:      ICD-10 Details  1 GU:   Prostate Cancer - C61   2   Urinary Retention - R33.8    PLAN:           Document Letter(s):  Created for Patient: Clinical Summary         Notes:   1. Prostate cancer: [name] was seen earlier today by Dr. Tammi Klippel and offered radiation therapy with long-term androgen deprivation therapy ascending the further evaluation of his pancreatic abnormality does not suggest pancreatic malignancy. I had a detailed discussion with [name] myself and reviewed options for therapy. He does understand the importance of continued compliant follow-up. He also specifically understands the importance of proceeding with further evaluation of his pancreatic abnormality with his MRCP. It was also discussed in the clinic today that he may benefit from EUS by Dr.  Ardis Hughs potentially for further evaluation and possible biopsy.   With regard to his prostate cancer, he understands he does have locally advanced disease with extension into the detrusor muscle of his bladder. He has extremely high grade disease as well with some concern on imaging for regional lymph node involvement and an extremely high risk for micrometastatic disease regardless. We discussed the likelihood that surgical therapy  would not be curative and maybe with some risk considering his history of coronary artery disease.   The patient was counseled about the natural history of prostate cancer and the standard treatment options that are available for prostate cancer. It was explained to him how his age and life expectancy, clinical stage, Gleason score, and PSA affect his prognosis, the decision to proceed with additional staging studies, as well as how that information influences recommended treatment strategies. We discussed the roles for active surveillance, radiation therapy, surgical therapy, androgen deprivation, as well as ablative therapy options for the treatment of prostate cancer as appropriate to his individual cancer situation. We discussed the risks and benefits of these options with regard to their impact on cancer control and also in terms of potential adverse events, complications, and impact on quality of life particularly related to urinary and sexual function. The patient was encouraged to ask questions throughout the discussion today and all questions were answered to his stated satisfaction. In addition, the patient was provided with and/or directed to appropriate resources and literature for further education about prostate cancer and treatment options.   Ultimately, the current plan is for him to proceed with further evaluation of his pancreatic abnormality with MRCP on Monday and possibly with EUS by Dr. Ardis Hughs if indicated for further evaluation and possible biopsy.   He will likely require androgen deprivation therapy regardless of his pancreatic evaluation considering his very high risk disease and this can be started at any time. Dr. Tammi Klippel will await further evaluation of his pancreatic abnormality but has tentatively suggest that he consider proceeding with external beam radiation therapy assuming that he is able to pass a voiding trial and does not require additional treatment of his urinary  retention. He will require fiducial markers prior to radiation therapy if he proceeds.   2. Urinary retention: I will talk to Dr. Junious Silk today so that he can be scheduled to come in for a voiding trial and likely to begin androgen deprivation therapy as soon as next week.   Cc: Dr. Eda Keys  Dr. Tyler Pita

## 2016-07-27 ENCOUNTER — Other Ambulatory Visit: Payer: Self-pay | Admitting: Cardiology

## 2016-07-28 ENCOUNTER — Ambulatory Visit (HOSPITAL_COMMUNITY)
Admission: RE | Admit: 2016-07-28 | Discharge: 2016-07-28 | Disposition: A | Discharge status: Home / Self Care | Payer: Medicare Other | Source: Ambulatory Visit | Attending: Urology | Admitting: Urology

## 2016-07-28 DIAGNOSIS — I700 Atherosclerosis of aorta: Secondary | ICD-10-CM | POA: Diagnosis not present

## 2016-07-28 DIAGNOSIS — K869 Disease of pancreas, unspecified: Secondary | ICD-10-CM | POA: Diagnosis not present

## 2016-07-28 DIAGNOSIS — N1330 Unspecified hydronephrosis: Secondary | ICD-10-CM | POA: Diagnosis not present

## 2016-07-28 DIAGNOSIS — N281 Cyst of kidney, acquired: Secondary | ICD-10-CM | POA: Insufficient documentation

## 2016-07-28 MED ORDER — GADOBENATE DIMEGLUMINE 529 MG/ML IV SOLN
10.0000 mL | Freq: Once | INTRAVENOUS | Status: AC | PRN
  Administered 2016-07-28: 10 mL via INTRAVENOUS

## 2016-07-30 ENCOUNTER — Telehealth: Payer: Self-pay | Admitting: *Deleted

## 2016-07-30 ENCOUNTER — Telehealth: Payer: Self-pay | Admitting: Gastroenterology

## 2016-07-30 ENCOUNTER — Other Ambulatory Visit: Payer: Self-pay

## 2016-07-30 DIAGNOSIS — K8689 Other specified diseases of pancreas: Secondary | ICD-10-CM

## 2016-07-30 NOTE — Telephone Encounter (Signed)
The pt has been scheduled for 8/16 1230 pm Left message on machine to call back

## 2016-07-30 NOTE — Telephone Encounter (Signed)
Dr Jacobs please review  

## 2016-07-30 NOTE — Telephone Encounter (Signed)
Called patient to update regarding urology appts. , lvm for a return call.

## 2016-07-30 NOTE — Telephone Encounter (Signed)
I reviewed his notes, his imaging. He has an incidentally found mass in the head, neck of pancreas which needs further workup with endoscopic ultrasound and fine-needle aspiration.  He needs upper EUS, radial +/- linear, next available EUS Thursday with MAC sedation for pancreatic mass.  thanks

## 2016-07-30 NOTE — Telephone Encounter (Signed)
Received referral from [facility] to evaluate patient for pancreatic mass and possible EUS by Dr.Jacobs. Referral placed on Patty's desk for review.

## 2016-07-31 NOTE — Telephone Encounter (Signed)
Left message on machine to call back  

## 2016-08-01 ENCOUNTER — Encounter: Payer: Self-pay | Admitting: General Practice

## 2016-08-01 ENCOUNTER — Telehealth: Payer: Self-pay | Admitting: Medical Oncology

## 2016-08-01 NOTE — Telephone Encounter (Signed)
Left message on machine to call back  

## 2016-08-01 NOTE — Progress Notes (Signed)
[facility] Spiritual Care Note  Acquainted with [name] from [facility].  Received phone call requesting that I update his phone number, which I did.  Per pt, his cell phone broke and does not expect to restore that number.  NEW number (home):  [phone].  Per pt, Roxie, Gueye is best emergency contact, just as listed in chart.   [place], [place], [name] Pager [phone] Voicemail [phone]

## 2016-08-01 NOTE — Telephone Encounter (Signed)
The pt has been sent a copy of the instructions in the mail.  I have been unable to reach him by phone despite numerous messages left on voice mail .

## 2016-08-01 NOTE — Telephone Encounter (Signed)
Spoke with [name] to follow up post Prostate MDC. He states he had his MRI 07/28/16 and has an appointment with Dr. Ardis Hughs in August. He is scheduled for his hormone injection at [facility] 08/12/16. I asked him to call me with questions or concerns. He voiced understanding.

## 2016-08-08 ENCOUNTER — Telehealth: Payer: Self-pay

## 2016-08-08 ENCOUNTER — Telehealth: Payer: Self-pay | Admitting: *Deleted

## 2016-08-08 NOTE — Telephone Encounter (Signed)
Left a message for son-Yandel Saunders Glance. To call us back to make sure they got the instructions for his upcoming procedure. Dr Ardis Hughs nurse mailed those out earlier this week after being unable to reach the patient by phone.

## 2016-08-08 NOTE — Telephone Encounter (Signed)
Called patient to update about urology appt., lvm for a return call

## 2016-08-11 NOTE — Telephone Encounter (Signed)
I was able to reach [name] son and he said they did get the instructions in the mail and they are aware of the date/time of the upcoming procedure with Dr Ardis Hughs on 08/28/16 at [place] at 12:15PM, arrive at 10:45AM

## 2016-08-12 DIAGNOSIS — Z5111 Encounter for antineoplastic chemotherapy: Secondary | ICD-10-CM | POA: Diagnosis not present

## 2016-08-12 DIAGNOSIS — C61 Malignant neoplasm of prostate: Secondary | ICD-10-CM | POA: Diagnosis not present

## 2016-08-17 ENCOUNTER — Other Ambulatory Visit: Payer: Self-pay | Admitting: Cardiology

## 2016-08-18 ENCOUNTER — Other Ambulatory Visit: Payer: Self-pay | Admitting: Cardiology

## 2016-08-18 NOTE — Telephone Encounter (Signed)
New message    *STAT* If patient is at the pharmacy, call can be transferred to refill team.   1. Which medications need to be refilled? (please list name of each medication and dose if known) rosuvastatin (CRESTOR) 40 MG tablet  2. Which pharmacy/location (including street and city if local pharmacy) is medication to be sent to? [facility] [address]  3. Do they need a 30 day or 90 day supply? Pt has appt 9/27 at 9am with Dr. Stanford Breed

## 2016-08-18 NOTE — Telephone Encounter (Signed)
rosuvastatin (CRESTOR) 40 MG tablet 30 tablet 0 08/18/2016    Sig: take 1 tablet by mouth once daily *SCHEDULE APPT FOR REFILLS   Sent to pharmacy as: rosuvastatin (CRESTOR) 40 MG tablet   Notes to Pharmacy: [place] FURTHER REFILLS   E-Prescribing Status: Receipt confirmed by pharmacy (08/18/2016 1:56 PM EDT)   Pharmacy   [id number] [place], [place]

## 2016-08-21 ENCOUNTER — Encounter (HOSPITAL_COMMUNITY): Payer: Self-pay | Admitting: *Deleted

## 2016-08-25 ENCOUNTER — Encounter: Payer: Self-pay | Admitting: Radiation Oncology

## 2016-08-25 DIAGNOSIS — C61 Malignant neoplasm of prostate: Secondary | ICD-10-CM | POA: Diagnosis not present

## 2016-08-25 DIAGNOSIS — N3001 Acute cystitis with hematuria: Secondary | ICD-10-CM | POA: Diagnosis not present

## 2016-08-25 NOTE — Progress Notes (Signed)
I was contacted by Dr. Junious Silk about this patient.  We set him up to get gold markers for IGRT with urology today with CT planned on 8/16.  However, Dr. Junious Silk reminded me that the patient does have a very suspicious pancreas mass that has not been biopsied.  Coincidentally, the EUS for pancreas evaluation is also scheduled for 8/16.  I confirmed to Dr. Junious Silk that we should continue ADT and await the resolution of the pancreas work-up prior to moving forward with final recommendations for his high risk prostate cancer.  Dr. Junious Silk was comfortable with this plan.  He cancelled gold markers and will follow-up with the patient after the pancreas biopsy.  We will cancel our planning appointment and also follow-up on the EUS result.

## 2016-08-28 ENCOUNTER — Ambulatory Visit: Payer: Medicare Other | Admitting: Radiation Oncology

## 2016-08-28 ENCOUNTER — Ambulatory Visit (HOSPITAL_COMMUNITY): Payer: Medicare Other | Admitting: Anesthesiology

## 2016-08-28 ENCOUNTER — Ambulatory Visit (HOSPITAL_COMMUNITY)
Admission: RE | Admit: 2016-08-28 | Discharge: 2016-08-28 | Disposition: A | Discharge status: Home / Self Care | Payer: Medicare Other | Source: Ambulatory Visit | Attending: Gastroenterology | Admitting: Gastroenterology

## 2016-08-28 ENCOUNTER — Encounter (HOSPITAL_COMMUNITY)
Admission: RE | Disposition: A | Discharge status: Home / Self Care | Payer: Self-pay | Source: Ambulatory Visit | Attending: Gastroenterology

## 2016-08-28 ENCOUNTER — Encounter (HOSPITAL_COMMUNITY): Payer: Self-pay | Admitting: Gastroenterology

## 2016-08-28 DIAGNOSIS — R109 Unspecified abdominal pain: Secondary | ICD-10-CM | POA: Diagnosis not present

## 2016-08-28 DIAGNOSIS — C250 Malignant neoplasm of head of pancreas: Secondary | ICD-10-CM | POA: Insufficient documentation

## 2016-08-28 DIAGNOSIS — I252 Old myocardial infarction: Secondary | ICD-10-CM | POA: Diagnosis not present

## 2016-08-28 DIAGNOSIS — Z8546 Personal history of malignant neoplasm of prostate: Secondary | ICD-10-CM | POA: Diagnosis not present

## 2016-08-28 DIAGNOSIS — Z79899 Other long term (current) drug therapy: Secondary | ICD-10-CM | POA: Diagnosis not present

## 2016-08-28 DIAGNOSIS — K8689 Other specified diseases of pancreas: Secondary | ICD-10-CM | POA: Diagnosis not present

## 2016-08-28 DIAGNOSIS — I2510 Atherosclerotic heart disease of native coronary artery without angina pectoris: Secondary | ICD-10-CM | POA: Insufficient documentation

## 2016-08-28 DIAGNOSIS — Z888 Allergy status to other drugs, medicaments and biological substances status: Secondary | ICD-10-CM | POA: Insufficient documentation

## 2016-08-28 DIAGNOSIS — K869 Disease of pancreas, unspecified: Secondary | ICD-10-CM | POA: Diagnosis present

## 2016-08-28 DIAGNOSIS — I130 Hypertensive heart and chronic kidney disease with heart failure and stage 1 through stage 4 chronic kidney disease, or unspecified chronic kidney disease: Secondary | ICD-10-CM | POA: Diagnosis not present

## 2016-08-28 DIAGNOSIS — E46 Unspecified protein-calorie malnutrition: Secondary | ICD-10-CM | POA: Diagnosis not present

## 2016-08-28 DIAGNOSIS — E785 Hyperlipidemia, unspecified: Secondary | ICD-10-CM | POA: Insufficient documentation

## 2016-08-28 DIAGNOSIS — I340 Nonrheumatic mitral (valve) insufficiency: Secondary | ICD-10-CM | POA: Diagnosis not present

## 2016-08-28 DIAGNOSIS — D509 Iron deficiency anemia, unspecified: Secondary | ICD-10-CM | POA: Diagnosis not present

## 2016-08-28 DIAGNOSIS — I509 Heart failure, unspecified: Secondary | ICD-10-CM | POA: Insufficient documentation

## 2016-08-28 DIAGNOSIS — Z951 Presence of aortocoronary bypass graft: Secondary | ICD-10-CM | POA: Diagnosis not present

## 2016-08-28 DIAGNOSIS — N189 Chronic kidney disease, unspecified: Secondary | ICD-10-CM | POA: Diagnosis not present

## 2016-08-28 DIAGNOSIS — C259 Malignant neoplasm of pancreas, unspecified: Secondary | ICD-10-CM | POA: Diagnosis not present

## 2016-08-28 DIAGNOSIS — Z87891 Personal history of nicotine dependence: Secondary | ICD-10-CM | POA: Diagnosis not present

## 2016-08-28 HISTORY — PX: EUS: SHX5427

## 2016-08-28 SURGERY — UPPER ENDOSCOPIC ULTRASOUND (EUS) RADIAL
Anesthesia: Monitor Anesthesia Care

## 2016-08-28 MED ORDER — PROPOFOL 10 MG/ML IV BOLUS
INTRAVENOUS | Status: AC
  Filled 2016-08-28: qty 40

## 2016-08-28 MED ORDER — PROPOFOL 500 MG/50ML IV EMUL
INTRAVENOUS | Status: DC | PRN
  Administered 2016-08-28: 100 ug/kg/min via INTRAVENOUS

## 2016-08-28 MED ORDER — SODIUM CHLORIDE 0.9 % IV SOLN
INTRAVENOUS | Status: DC
  Administered 2016-08-28 (×2): via INTRAVENOUS

## 2016-08-28 MED ORDER — LIDOCAINE 2% (20 MG/ML) 5 ML SYRINGE
INTRAMUSCULAR | Status: DC | PRN
  Administered 2016-08-28: 100 mg via INTRAVENOUS

## 2016-08-28 MED ORDER — PROPOFOL 10 MG/ML IV BOLUS
INTRAVENOUS | Status: AC
  Filled 2016-08-28: qty 20

## 2016-08-28 MED ORDER — PROPOFOL 10 MG/ML IV BOLUS
INTRAVENOUS | Status: DC | PRN
  Administered 2016-08-28: 50 mg via INTRAVENOUS

## 2016-08-28 MED ORDER — LIDOCAINE 2% (20 MG/ML) 5 ML SYRINGE
INTRAMUSCULAR | Status: AC
  Filled 2016-08-28: qty 5

## 2016-08-28 NOTE — Op Note (Signed)
[facility] Patient Name: [name] Procedure Date: 08/28/2016 MRN: [id number] Attending MD: Milus Banister , MD Date of Birth: Oct 17, 1945 CSN: [id number] Age: 71 Admit Type: Outpatient Procedure:                Upper EUS Indications:              Mass in pancreas on MRI recently as workup for high                            grade prostate cancer; increasing main pancreatic                            duct dilation (which dates back to 01/2015 on                            imaging) Providers:                Milus Banister, MD, Vista Lawman, RN, Cletis Athens, Technician Referring MD:             Eda Keys, MD Medicines:                Monitored Anesthesia Care Complications:            No immediate complications. Estimated blood loss:                            None. Estimated Blood Loss:     Estimated blood loss: none. Procedure:                Pre-Anesthesia Assessment:                           - Prior to the procedure, a History and Physical                            was performed, and patient medications and                            allergies were reviewed. The patient's tolerance of                            previous anesthesia was also reviewed. The risks                            and benefits of the procedure and the sedation                            options and risks were discussed with the patient.                            All questions were answered, and informed consent  was obtained. Prior Anticoagulants: The patient has                            taken no previous anticoagulant or antiplatelet                            agents. ASA Grade Assessment: II - A patient with                            mild systemic disease. After reviewing the risks                            and benefits, the patient was deemed in                            satisfactory condition to undergo the procedure.                        After obtaining informed consent, the endoscope was                            passed under direct vision. Throughout the                            procedure, the patient's blood pressure, pulse, and                            oxygen saturations were monitored continuously. The                            Olympus GF-UE160-AC5 [id number] ultrasound scope was                            introduced through the mouth, and advanced to the                            second part of duodenum. The Olympus GF-UE160-[id number]                            [id number] ultrasound scope was introduced through                            the mouth, and advanced to the second part of                            duodenum. The upper EUS was accomplished without                            difficulty. The patient tolerated the procedure                            well. Findings:      Endoscopic Finding :      The examined esophagus was endoscopically normal.      The entire  examined stomach was endoscopically normal.      The examined duodenum was endoscopically normal.      Endosonographic Finding :      1. An irregular mass was identified in the pancreatic head. The mass was       hypoechoic and heterogenous. The mass measured 19cm by 1.5cm The       endosonographic borders were poorly-defined and the mass directly abuts       the main portal vein near the confluence, suggesting invasion. The       remainder of the pancreas was examined. The endosonographic appearance       of parenchyma and the upstream pancreatic duct indicated duct dilation       and a maximum duct diameter of 10 mm. Fine needle aspiration for       cytology was performed. Two passes were made with the 25 gauge needle       using a transduodenal approach. A cytotechnologist was present to       evaluate the adequacy of the specimen.      2. No peripancreatic adenopathy.      3. The CBD was normal, non-dilated      4. Limited  views of the liver, spleen were normal. Impression:               - 1.9cm by 1.5cm mass in the head of pancreas                            causing main pancreatic duct obstruction and                            directly abutting the portal vein (suggesting                            invasion). The mass is not obstructing the bile                            duct. Preliminary reading of FNA shows "very                            atypical cells." Moderate Sedation:      N/A- Per Anesthesia Care Recommendation:           - Discharge patient to home (ambulatory).                           - Await cytology results. Procedure Code(s):        --- Professional ---                           [phone], Esophagogastroduodenoscopy, flexible,                            transoral; with transendoscopic ultrasound-guided                            intramural or transmural fine needle                            aspiration/biopsy(s), (includes endoscopic  ultrasound examination limited to the esophagus,                            stomach or duodenum, and adjacent structures) Diagnosis Code(s):        --- Professional ---                           K86.89, Other specified diseases of pancreas                           R93.3, Abnormal findings on diagnostic imaging of                            other parts of digestive tract CPT copyright 2016 American Medical Association. All rights reserved. The codes documented in this report are preliminary and upon coder review may  be revised to meet current compliance requirements. Milus Banister, MD 08/28/2016 1:37:01 PM This report has been signed electronically. Number of Addenda: 0

## 2016-08-28 NOTE — Anesthesia Preprocedure Evaluation (Signed)
Anesthesia Evaluation  Patient identified by MRN, date of birth, ID band Patient awake    Reviewed: Allergy & Precautions, NPO status , Patient's Chart, lab work & pertinent test results  Airway Mallampati: I  TM Distance: >3 FB Neck ROM: Full    Dental   Pulmonary former smoker,    Pulmonary exam normal breath sounds clear to auscultation       Cardiovascular hypertension, Pt. on medications + CAD, + Past MI and +CHF  Normal cardiovascular exam Rhythm:Regular  ECG: SR, rate 63 3/14 ECHO Study Conclusions  - Left ventricle: Akinesis of following segments: mid/apical inferior septal, mid/apical inferior, mid anterior septal. The cavity size was normal. Wall thickness was increased in a pattern of mild LVH. The estimated ejection fraction was 45%. - Mitral valve: Mild regurgitation. - Left atrium: The atrium was mildly to moderately dilated. Impressions:  - Overall LV function is a little better than 11/2009 study. There is no residual VSD.    Neuro/Psych    GI/Hepatic   Endo/Other    Renal/GU CRFRenal disease     Musculoskeletal   Abdominal   Peds  Hematology  (+) anemia ,   Anesthesia Other Findings Hyperlipidemia  Reproductive/Obstetrics                             Anesthesia Physical  Anesthesia Plan  ASA: III  Anesthesia Plan: MAC   Post-op Pain Management:    Induction: Intravenous  PONV Risk Score and Plan: 1 and Ondansetron and Propofol infusion  Airway Management Planned:   Additional Equipment:   Intra-op Plan:   Post-operative Plan:   Informed Consent: I have reviewed the patients History and Physical, chart, labs and discussed the procedure including the risks, benefits and alternatives for the proposed anesthesia with the patient or authorized representative who has indicated his/her understanding and acceptance.   Dental advisory given  Plan  Discussed with: CRNA  Anesthesia Plan Comments:         Anesthesia Quick Evaluation

## 2016-08-28 NOTE — H&P (Signed)
HPI: This is a 71 yo man found to have pancreatic mass on recent imaging done for prostate cancer workup  Chief complaint is pancreatic mass  ROS: complete GI ROS as described in HPI, all other review negative.  Constitutional:  No unintentional weight loss   Past Medical History:  Diagnosis Date  . Acute MI, inferior wall ([place]) 1966  . Amputation finger    right first finger top portion age 43  . CHF (congestive heart failure) ([place])   . Coronary artery disease    severe 3 vessel   . Fall   . Foley catheter in place   . H/O hyperkalemia   . H/O thrombocytosis   . Hip fracture, left ([place])    11/2014  . History of blood transfusion   . History of metabolic acidosis   . History of tobacco abuse   . Hypertension   . Obstructive uropathy   . Pneumonia    childhood   . Prostate cancer ([place])   . S/P VSD repair    09/2009  . Trichomonas infection   . Urethral stricture due to infective diseases classified elsewhere   . Urinary tract infection     Past Surgical History:  Procedure Laterality Date  . CORONARY ARTERY BYPASS GRAFT     times 4  . CYSTOSCOPY W/ RETROGRADES N/A 04/03/2015   Procedure:  BLADDER BIOPSIES;  Surgeon: Festus Aloe, MD;  Location: [facility];  Service: Urology;  Laterality: N/A;  . CYSTOSCOPY W/ RETROGRADES Bilateral 06/27/2016   Procedure: CYSTOSCOPY WITH BILATERAL RETROGRADES;  Surgeon: Festus Aloe, MD;  Location: [facility];  Service: Urology;  Laterality: Bilateral;  . CYSTOSCOPY WITH BIOPSY N/A 06/27/2016   Procedure: CYSTOSCOPY WITH BLADDER BIOPSY URETHRAL BIOPSY;  Surgeon: Festus Aloe, MD;  [facility];  Service: Urology;  Laterality: N/A;  . CYSTOSCOPY WITH URETHRAL DILATATION N/A 04/03/2015   Procedure: CYSTOSCOPY WITH BILATERAL RETROGRADES;  Surgeon: Festus Aloe, MD;  [facility];  Service: Urology;  Laterality: N/A;  . flexible cystoscopy    . repair of post infarction posterior ventricular septal defect    .  TRANSURETHRAL RESECTION OF PROSTATE  06/27/2016   Procedure: TRANSURETHRAL RESECTION OF THE PROSTATE (TURP);  Surgeon: Festus Aloe, MD;  [facility];  Service: Urology;;    Current Facility-Administered Medications  Medication Dose Route Frequency Provider Last Rate Last Dose  . 0.9 %  sodium chloride infusion   Intravenous Continuous Milus Banister, MD       Facility-Administered Medications Ordered in Other Encounters  Medication Dose Route Frequency Provider Last Rate Last Dose  . ceFAZolin (ANCEF) IVPB 2g/100 mL premix  2 g Intravenous 30 min Pre-Op Festus Aloe, MD        Allergies as of 07/30/2016 - Review Complete 07/25/2016  Allergen Reaction Noted  . Chlorhexidine gluconate Other (See Comments) 03/27/2015    History reviewed. No pertinent family history.  Social History   Social History  . Marital status: Divorced    Spouse name: N/A  . Number of children: N/A  . Years of education: N/A   Occupational History  . Not on file.   Social History Main Topics  . Smoking status: Former Smoker    Packs/day: 1.50    Years: 45.00    Types: Cigarettes    Quit date: 01/13/2010  . Smokeless tobacco: Never Used  . Alcohol use No  . Drug use: No  . Sexual activity: Not on file   Other Topics Concern  .  Not on file   Social History Narrative  . No narrative on file     Physical Exam: There were no vitals taken for this visit. Constitutional: generally well-appearing Psychiatric: alert and oriented x3 Abdomen: soft, nontender, nondistended, no obvious ascites, no peritoneal signs, normal bowel sounds No peripheral edema noted in lower extremities  Assessment and plan: 71 y.o. male with pancreatic mass   For upperEUS today.  Please see the "Patient Instructions" section for addition details about the plan.  [name], MD [facility] 08/28/2016, 11:52 AM

## 2016-08-28 NOTE — Discharge Instructions (Signed)

## 2016-08-28 NOTE — Anesthesia Procedure Notes (Signed)
Procedure Name: MAC Performed by: Aitana Burry D Oxygen Delivery Method: Nasal cannula       

## 2016-08-28 NOTE — Transfer of Care (Signed)
Immediate Anesthesia Transfer of Care Note  Patient: [name]  Procedure(s) Performed: Procedure(s): UPPER ENDOSCOPIC ULTRASOUND (EUS) RADIAL (N/A)  Patient Location: PACU  Anesthesia Type:MAC  Level of Consciousness: awake, alert  and oriented  Airway & Oxygen Therapy: Patient Spontanous Breathing and Patient connected to nasal cannula oxygen  Post-op Assessment: Report given to RN and Post -op Vital signs reviewed and stable  Post vital signs: Reviewed and stable  Last Vitals:  Vitals:   08/28/16 1159 08/28/16 1341  BP: (!) 170/101 133/79  Pulse: 69 70  Resp: 15 16  Temp: 37.1 C 36.4 C  SpO2: 99% 98%    Last Pain:  Vitals:   08/28/16 1341  TempSrc: Oral         Complications: No apparent anesthesia complications

## 2016-08-29 ENCOUNTER — Encounter (HOSPITAL_COMMUNITY): Payer: Self-pay | Admitting: Gastroenterology

## 2016-08-29 NOTE — Anesthesia Postprocedure Evaluation (Signed)
Anesthesia Post Note  Patient: [name]  Procedure(s) Performed: Procedure(s) (LRB): UPPER ENDOSCOPIC ULTRASOUND (EUS) RADIAL (N/A)     Patient location during evaluation: PACU Anesthesia Type: MAC Level of consciousness: awake and alert Pain management: pain level controlled Vital Signs Assessment: post-procedure vital signs reviewed and stable Respiratory status: spontaneous breathing Cardiovascular status: stable Anesthetic complications: no    Last Vitals:  Vitals:   08/28/16 1350 08/28/16 1400  BP: 130/82 132/78  Pulse: 61 (!) 56  Resp: 16 20  Temp:    SpO2: 98% 98%    Last Pain:  Vitals:   08/28/16 1341  TempSrc: Oral   Pain Goal:                 [name]

## 2016-09-01 ENCOUNTER — Other Ambulatory Visit: Payer: Self-pay | Admitting: Radiation Oncology

## 2016-09-01 DIAGNOSIS — C250 Malignant neoplasm of head of pancreas: Secondary | ICD-10-CM

## 2016-09-01 DIAGNOSIS — C61 Malignant neoplasm of prostate: Secondary | ICD-10-CM

## 2016-09-04 ENCOUNTER — Telehealth: Payer: Self-pay | Admitting: Hematology

## 2016-09-04 ENCOUNTER — Encounter: Payer: Self-pay | Admitting: Hematology

## 2016-09-04 ENCOUNTER — Telehealth: Payer: Self-pay | Admitting: *Deleted

## 2016-09-04 NOTE — Telephone Encounter (Signed)
Patient to have this appt. On 09-12-16 @ 1:15 pm with Dr. Burr Medico, pt. aware of this appt.

## 2016-09-04 NOTE — Telephone Encounter (Signed)
Appt has been scheduled to see Dr. Burr Medico on 8/31 at 1:15pm. Address verified. Letter mailed.

## 2016-09-11 NOTE — Progress Notes (Signed)
[facility]  Telephone:([phone] [phone]) [phone]  Clinic New Consult Note   Patient Care Team: Patient, No Pcp Per as PCP - General (General Practice) 09/12/2016  REFERRAL PHYSICIAN: DR. Ardis Hughs  CHIEF COMPLAINTS/PURPOSE OF CONSULTATION:  Pancreatic cancer   ISTORY OF PRESENTING ILLNESS:  [name] 71 y.o. male is here because of pancreatic cancer. Approximately one year ago, pt was admitted for urinary retention. Dr Junious Silk met him on service in the hospital. Since then he has been followed by closely [facility] for ongoing BPH with urinary retention. He did have a foley catheter in place for 3-4 months ago until approximately one month ago when this was removed. Pt had a DRE in 06/2016 which showed some induration, he underwent TURP with prostate biopsy following this on 06/27/2016. His pathology revealed Gleason 5+5 disease in all TUR chips with local invasion into the bladder detrusor muscle. He had a PSA performed on 07/11/16 which was 17.5. Pt recently had consult with radiation oncology and he has been taking injections since. He has not been tolerating any other treatments for his prostate cancer. He also had a CT A/p and NM Bone scan performed on 07/11/16. His bone scan was negative for any osseous metastatic abnormalities; however, his CT scan showed a single pathologically enlarged pelvic lymph node as well as dilation of the main pancreatic duct without discrete pancreatic mass. Subsequently he underwent MR A/p on 07/28/16 which showed a suspicious lesion involving the head of the pancreas and obstructed the pancreatic duct. Involvement of the portal vein and portal venous confluence was suspected, per radiology. Overall, pt is a poor historian.   He was subsequently referred by Dr Ardis Hughs to medical oncology for further management. He is not currently followed by a PCP. He denies abdominal pain, diarrhea, constipation, unexpected weight loss, loss of  appetite, chest pain, shortness of breath, fatigue. He is otherwise asymptomatic and feeling at his baseline today. Of note, he states that he did not take his prescribed BP medications this morning, 09/12/16.   He stopped smoking ~13 years ago, and is only an occasional drinker. He is currently retired from [employer]. He is currently single and does have five children, two of which are his step-sons. He states that he lives in an apartment here in [place] with his two youngest sons, ages 85 and 11yo.   MEDICAL HISTORY:  Past Medical History:  Diagnosis Date  . Acute MI, inferior wall ([place]) 1966  . Amputation finger    right first finger top portion age 66  . CHF (congestive heart failure) ([place])   . Coronary artery disease    severe 3 vessel   . Fall   . Foley catheter in place   . H/O hyperkalemia   . H/O thrombocytosis   . Hip fracture, left ([place])    11/2014  . History of blood transfusion   . History of metabolic acidosis   . History of tobacco abuse   . Hypertension   . Obstructive uropathy   . Pneumonia    childhood   . Prostate cancer ([place])   . S/P VSD repair    09/2009  . Trichomonas infection   . Urethral stricture due to infective diseases classified elsewhere   . Urinary tract infection     SURGICAL HISTORY: Past Surgical History:  Procedure Laterality Date  . CORONARY ARTERY BYPASS GRAFT     times 4  . CYSTOSCOPY W/ RETROGRADES N/A 04/03/2015   Procedure:  BLADDER  BIOPSIES;  Surgeon: Festus Aloe, MD;  [facility];  Service: Urology;  Laterality: N/A;  . CYSTOSCOPY W/ RETROGRADES Bilateral 06/27/2016   Procedure: CYSTOSCOPY WITH BILATERAL RETROGRADES;  Surgeon: Festus Aloe, MD;  [facility];  Service: Urology;  Laterality: Bilateral;  . CYSTOSCOPY WITH BIOPSY N/A 06/27/2016   Procedure: CYSTOSCOPY WITH BLADDER BIOPSY URETHRAL BIOPSY;  Surgeon: Festus Aloe, MD;  [facility];  Service: Urology;  Laterality: N/A;  . CYSTOSCOPY WITH  URETHRAL DILATATION N/A 04/03/2015   Procedure: CYSTOSCOPY WITH BILATERAL RETROGRADES;  Surgeon: Festus Aloe, MD;  [facility];  Service: Urology;  Laterality: N/A;  . EUS N/A 08/28/2016   Procedure: UPPER ENDOSCOPIC ULTRASOUND (EUS) RADIAL;  Surgeon: Milus Banister, MD;  [facility];  Service: Endoscopy;  Laterality: N/A;  . flexible cystoscopy    . repair of post infarction posterior ventricular septal defect    . TRANSURETHRAL RESECTION OF PROSTATE  06/27/2016   Procedure: TRANSURETHRAL RESECTION OF THE PROSTATE (TURP);  Surgeon: Festus Aloe, MD;  [facility];  Service: Urology;;    SOCIAL HISTORY: Social History   Social History  . Marital status: Divorced    Spouse name: N/A  . Number of children: N/A  . Years of education: N/A   Occupational History  . Not on file.   Social History Main Topics  . Smoking status: Former Smoker    Packs/day: 1.50    Years: 45.00    Types: Cigarettes    Quit date: 01/13/2010  . Smokeless tobacco: Never Used  . Alcohol use No     Comment: occasional   . Drug use: No  . Sexual activity: Not on file   Other Topics Concern  . Not on file   Social History Narrative  . No narrative on file    FAMILY HISTORY: Family History  Problem Relation Age of Onset  . Cancer Paternal Aunt        unknown type cancer   . Cancer Paternal Uncle        unknown type cancer    ALLERGIES:  is allergic to chlorhexidine gluconate.  MEDICATIONS:  Current Outpatient Prescriptions  Medication Sig Dispense Refill  . amLODipine (NORVASC) 10 MG tablet take 1/2 tablet by mouth once daily (Patient not taking: Reported on 09/12/2016) 30 tablet 11  . carvedilol (COREG) 12.5 MG tablet Take 1 tablet (12.5 mg total) by mouth 2 (two) times daily with a meal. <PLEASE MAKE APPOINTMENT FOR REFILLS> (Patient not taking: Reported on 09/12/2016) 30 tablet 0  . rosuvastatin (CRESTOR) 40 MG tablet take 1 tablet by mouth once daily *SCHEDULE APPT  FOR REFILLS (Patient not taking: Reported on 09/12/2016) 30 tablet 0   No current facility-administered medications for this visit.    Facility-Administered Medications Ordered in Other Visits  Medication Dose Route Frequency Provider Last Rate Last Dose  . ceFAZolin (ANCEF) IVPB 2g/100 mL premix  2 g Intravenous 30 min Pre-Op Festus Aloe, MD        REVIEW OF SYSTEMS:   Constitutional: Denies fevers, chills or abnormal night sweats Eyes: Denies blurriness of vision, double vision or watery eyes Ears, nose, mouth, throat, and face: Denies mucositis or sore throat Respiratory: Denies cough, dyspnea or wheezes Cardiovascular: Denies palpitation, chest discomfort or lower extremity swelling Gastrointestinal:  Denies nausea, heartburn or change in bowel habits Skin: Denies abnormal skin rashes Lymphatics: Denies new lymphadenopathy or easy bruising Neurological:Denies numbness, tingling or new weaknesses Behavioral/Psych: Mood is stable, no new changes  All other systems were reviewed with the patient and are negative.  PHYSICAL EXAMINATION:  ECOG PERFORMANCE STATUS: 0 - Asymptomatic  Vitals:   09/12/16 1310 09/12/16 1329  BP: (!) 174/108 (!) 150/99  Pulse: 65   Resp: 20   Temp: 98.4 F (36.9 C)   SpO2: 99%    Filed Weights   09/12/16 1310  Weight: 206 lb 4.8 oz (93.6 kg)    GENERAL:alert, no distress and comfortable SKIN: skin color, texture, turgor are normal, no rashes or significant lesions EYES: normal, conjunctiva are pink and non-injected, sclera clear OROPHARYNX:no exudate, no erythema and lips, buccal mucosa, and tongue normal  NECK: supple, thyroid normal size, non-tender, without nodularity LYMPH:  no palpable lymphadenopathy in the cervical, axillary or inguinal LUNGS: clear to auscultation and percussion with normal breathing effort HEART: regular rate & rhythm and no murmurs and no lower extremity edema ABDOMEN:abdomen soft, non-tender and normal bowel  sounds Musculoskeletal:no cyanosis of digits and no clubbing  PSYCH: alert & oriented x 3 with fluent speech NEURO: no focal motor/sensory deficits  LABORATORY DATA:  I have reviewed the data as listed CBC Latest Ref Rng & Units 06/27/2016 05/09/2016 12/05/2015  WBC 4.0 - 10.5 K/uL - 6.4 6.6  Hemoglobin 13.0 - 17.0 g/dL 11.2(L) 11.0(L) 8.0(L)  Hematocrit 39.0 - 52.0 % - 33.9(L) 26.2(L)  Platelets 150 - 400 K/uL - 333 396    PATHOLOGY REPORTS:  Diagnosis 08/29/16 FINE NEEDLE ASPIRATION, ENDOSCOPIC, PANCREAS HEAD(SPECIMEN 1 OF 1 COLLECTED 08/28/16): POORLY DIFFERENTIATED CARCINOMA ASSOCIATED WITH ACUTE INFLAMMATION. SEE COMMENT. Comment: There are a few cells with features suggestive of adenocarcinoma.  PSA:  11/23/2006: 0.72 01/21/16: 0.73 07/11/16: 17.5  PROCEDURES:  UPPER ENDOSCOPIC [place] 08/28/16 IMPRESSION:  1.9cm by 1.5cm mass in the head of pancreas causing main pancreatic duct obstruction and directly abutting the portal vein (suggesting invasion). The mass is not obstructing the bile duct. Preliminary reading of FNA shows "very atypical cells."  RADIOGRAPHIC STUDIES: I have personally reviewed the radiological images as listed and agreed with the findings in the report. No results found.   MR Abd w/wo contrast 07/28/16 IMPRESSION: 1. There is a suspicious lesion involving the head of pancreas and obstructing the pancreatic duct is identified. Involvement of the portal vein and portal venous confluence is suspected. Findings worrisome for pancreatic adenocarcinoma. Further investigation with endoscopic ultrasound and tissue sampling advise. 2. Resolution of bilateral hydronephrosis. Bilateral kidney cysts noted. 3. Aortic atherosclerosis.  NM Bone Scan Whole Body 07/08/16 IMPRESSION: There are no findings suspicious for metastatic disease to the skeleton.   ASSESSMENT: 71 year old African-American male, with past medical history of CAD, and recently diagnosed  prostate cancer, gleason score 10, presented with incidental imaging finding of a pancreatic head mass.  1. Carcinoma of the head of the pancreas, [id number],stage IA -I reviewed his CT and MRI scan findings, which showed a mass in the pancreatic head, with probable portal vein involvement. The mass measures about 1.9 cm by endoscopy ultrasound, with ultrasound evidence of portal vein invasion. -I reviewed his surgical path findings with pt in great details. He did have UEUS on 08/28/16 by Dr Ardis Hughs, biopsy was performed during this which revealed a poorly differentiated carcinoma of the head of the pancreas. Due to his recent prostate cancer, I'll discuss with pathologist to see if we need to obtain some immunostaining, to ruled out metastatic prostate cancer, also I think this is much less likely. -I informed him that due to the nature and location of his  tumor that he would need Whipple surgery to resect it. We will have Dr Barry Dienes consulted and discuss in our GI tumor board about this next week. According to the ultrasound and MRI, his pancreatic mass appears to be borderline resectable, and may require neoadjuvant chemotherapy and/or radiation. -I discussed the aggressive nature of pancreatic cancer, and high risk of recurrence after surgical resection. Most people need neoadjuvant or adjuvant chemotherapy. I discussed the benefit of new adjuvant chemotherapy, which may improve the resectability of his tumor. -We will have him undergo CT Chest in one week to r/o pulmonary metastasis. -he is 37, with coronary artery disease, however he is asymptomatic, with good performance status, he would be a candidate for chemotherapy. Due to his significant heart disease, I will favor gemcitabine and abraxane regimen   2. CHF, HTN, CAD s/p CABGx4 -He is currently followed by Dr Stanford Breed of Cardiology for ongoing management of his cardiac issues.  -He is currently on Amlodipine, Crestor, and Crestor, but has some  issues with compliance.  -his last echo showed EF 45% in 2014, will repeat his echo before chemo   3. Newly diagnosed prostate cancer, gleason score 10  -He presented with urinary obstruction, seen by urologist Dr. Junious Silk, and prostate biopsy showed prostate cancer, Gleason score 10, which is very high risk -his staging CT abdomen and pelvis and a bone scan showed no other evidence of metastasis, except the pancreatic mass -he has started ADT, plan to have prostate seed placement by Dr. Tammi Klippel, which has been held since his pancreatic cancer diagnosis  -I will coordinate his care with Dr. Tammi Klippel   4. CKD stage III  -f/u PCP, avoid IV contrast and other nephrotoxic  PLAN:  -lab today -CT Chest in one week -Chemo class and f/u in 2 weeks.  -Refer to Dr Barry Dienes (Gen surg) -tumor board discussion next week   No orders of the defined types were placed in this encounter.   All questions were answered. The patient knows to call the clinic with any problems, questions or concerns. I spent 55 minutes counseling the patient face to face. The total time spent in the appointment was 60 minutes and more than 50% was on counseling.   This document serves as a record of services personally performed by Truitt Merle, MD. It was created on her behalf by Reola Mosher, a trained medical scribe. The creation of this record is based on the scribe's personal observations and the provider's statements to them. This document has been checked and approved by the attending provider.   Truitt Merle, MD 09/12/2016 2:16 PM

## 2016-09-12 ENCOUNTER — Encounter: Payer: Self-pay | Admitting: Hematology

## 2016-09-12 ENCOUNTER — Ambulatory Visit (HOSPITAL_BASED_OUTPATIENT_CLINIC_OR_DEPARTMENT_OTHER): Payer: Medicare Other | Admitting: Hematology

## 2016-09-12 ENCOUNTER — Telehealth: Payer: Self-pay | Admitting: Hematology

## 2016-09-12 ENCOUNTER — Ambulatory Visit (HOSPITAL_BASED_OUTPATIENT_CLINIC_OR_DEPARTMENT_OTHER): Payer: Medicare Other

## 2016-09-12 VITALS — BP 150/99 | HR 65 | Temp 98.4°F | Resp 20 | Ht 66.0 in | Wt 206.3 lb

## 2016-09-12 DIAGNOSIS — C250 Malignant neoplasm of head of pancreas: Secondary | ICD-10-CM | POA: Diagnosis not present

## 2016-09-12 DIAGNOSIS — C61 Malignant neoplasm of prostate: Secondary | ICD-10-CM

## 2016-09-12 DIAGNOSIS — C259 Malignant neoplasm of pancreas, unspecified: Secondary | ICD-10-CM | POA: Insufficient documentation

## 2016-09-12 DIAGNOSIS — R339 Retention of urine, unspecified: Secondary | ICD-10-CM

## 2016-09-12 DIAGNOSIS — I2510 Atherosclerotic heart disease of native coronary artery without angina pectoris: Secondary | ICD-10-CM

## 2016-09-12 DIAGNOSIS — N183 Chronic kidney disease, stage 3 (moderate): Secondary | ICD-10-CM | POA: Diagnosis not present

## 2016-09-12 LAB — CBC WITH DIFFERENTIAL/PLATELET
BASO%: 0.5 % (ref 0.0–2.0)
BASOS ABS: 0 10*3/uL (ref 0.0–0.1)
EOS%: 1.9 % (ref 0.0–7.0)
Eosinophils Absolute: 0.1 10*3/uL (ref 0.0–0.5)
HEMATOCRIT: 35.8 % — AB (ref 38.4–49.9)
HGB: 11.5 g/dL — ABNORMAL LOW (ref 13.0–17.1)
LYMPH#: 2.4 10*3/uL (ref 0.9–3.3)
LYMPH%: 43.2 % (ref 14.0–49.0)
MCH: 27.2 pg (ref 27.2–33.4)
MCHC: 32.2 g/dL (ref 32.0–36.0)
MCV: 84.4 fL (ref 79.3–98.0)
MONO#: 0.6 10*3/uL (ref 0.1–0.9)
MONO%: 10.2 % (ref 0.0–14.0)
NEUT#: 2.5 10*3/uL (ref 1.5–6.5)
NEUT%: 44.2 % (ref 39.0–75.0)
PLATELETS: 338 10*3/uL (ref 140–400)
RBC: 4.24 10*6/uL (ref 4.20–5.82)
RDW: 15.7 % — ABNORMAL HIGH (ref 11.0–14.6)
WBC: 5.6 10*3/uL (ref 4.0–10.3)

## 2016-09-12 LAB — COMPREHENSIVE METABOLIC PANEL
ALT: <6 U/L (ref 0–55)
ANION GAP: 6 meq/L (ref 3–11)
AST: 12 U/L (ref 5–34)
Albumin: 3.2 g/dL — ABNORMAL LOW (ref 3.5–5.0)
Alkaline Phosphatase: 100 U/L (ref 40–150)
BUN: 31.4 mg/dL — ABNORMAL HIGH (ref 7.0–26.0)
CHLORIDE: 110 meq/L — AB (ref 98–109)
CO2: 23 mEq/L (ref 22–29)
Calcium: 10.2 mg/dL (ref 8.4–10.4)
Creatinine: 2.4 mg/dL — ABNORMAL HIGH (ref 0.7–1.3)
EGFR: 30 mL/min/{1.73_m2} — ABNORMAL LOW (ref >90)
Glucose: 97 mg/dl (ref 70–140)
POTASSIUM: 4 meq/L (ref 3.5–5.1)
Sodium: 139 mEq/L (ref 136–145)
Total Bilirubin: 0.33 mg/dL (ref 0.20–1.20)
Total Protein: 8.1 g/dL (ref 6.4–8.3)

## 2016-09-12 NOTE — Telephone Encounter (Signed)
Gave patient AVS and calendar of upcoming September appointments.  °

## 2016-09-13 LAB — CANCER ANTIGEN 19-9: CAN 19-9: 21 U/mL (ref 0–35)

## 2016-09-14 ENCOUNTER — Other Ambulatory Visit: Payer: Self-pay | Admitting: Cardiology

## 2016-09-14 ENCOUNTER — Encounter: Payer: Self-pay | Admitting: Hematology

## 2016-09-16 NOTE — Telephone Encounter (Signed)
REFILLL

## 2016-09-19 ENCOUNTER — Ambulatory Visit (HOSPITAL_COMMUNITY)
Admission: RE | Admit: 2016-09-19 | Discharge: 2016-09-19 | Disposition: A | Discharge status: Home / Self Care | Payer: Medicare Other | Source: Ambulatory Visit | Attending: Hematology | Admitting: Hematology

## 2016-09-19 ENCOUNTER — Encounter (HOSPITAL_COMMUNITY): Payer: Self-pay

## 2016-09-19 DIAGNOSIS — Z951 Presence of aortocoronary bypass graft: Secondary | ICD-10-CM | POA: Diagnosis not present

## 2016-09-19 DIAGNOSIS — E042 Nontoxic multinodular goiter: Secondary | ICD-10-CM | POA: Diagnosis not present

## 2016-09-19 DIAGNOSIS — I517 Cardiomegaly: Secondary | ICD-10-CM | POA: Insufficient documentation

## 2016-09-19 DIAGNOSIS — R918 Other nonspecific abnormal finding of lung field: Secondary | ICD-10-CM | POA: Insufficient documentation

## 2016-09-19 DIAGNOSIS — C250 Malignant neoplasm of head of pancreas: Secondary | ICD-10-CM | POA: Diagnosis not present

## 2016-09-19 DIAGNOSIS — J439 Emphysema, unspecified: Secondary | ICD-10-CM | POA: Insufficient documentation

## 2016-09-19 DIAGNOSIS — I2510 Atherosclerotic heart disease of native coronary artery without angina pectoris: Secondary | ICD-10-CM | POA: Diagnosis not present

## 2016-09-19 DIAGNOSIS — I700 Atherosclerosis of aorta: Secondary | ICD-10-CM | POA: Insufficient documentation

## 2016-09-19 NOTE — Progress Notes (Signed)
[facility]  [phone] [phone]  Clinic Follow-up  Note   Patient Care Team: Patient, No Pcp Per as PCP - General (General Practice) 09/23/2016  REFERRAL PHYSICIAN: DR. Ardis Hughs  CHIEF COMPLAINTS/PURPOSE OF CONSULTATION:  Pancreatic cancer     Malignant neoplasm of prostate ([name])   06/27/2016 Initial Biopsy    Diagnosis 06/27/16 1. Urethra, biopsy, bulb KERATINIZED SQUAMOUS CELL METAPLASIA AND HYPERPLASIA 2. Ureter, biopsy, right orifice METASTATIC PROSTATIC ADENOCARCINOMA, GLEASON SCORE 5+5=10 INVOLVES 50% OF THE SPECIMEN 3. Urethra, biopsy, prostatic polyp PROSTATIC ADENOCARCINOMA, GLEASON SCORE 5+4=9 INVOLVES 30% OF THE RESECTED TISSUE 4. Bladder, biopsy, posterior METASTATIC PROSTATIC ADENOCARCINOMA, INVOLVING DETRUSOR MUSCLE BENIGN UROTHELIUM WITH POLYPOID CYSTITIS 5. Prostate, chips PROSTATIC ADENOCARCINOMA, GLEASON SCORE 5+5=10 INVOLVES 80% OF THE RESECTED TISSUE  ADDENDUM: Immunohistochemistry is attempted on the cell block and there is limited tumor present. The scant tumor present is negative with prostein and prostate specific antigen. Called to Dr. Burr Medico and Dr. Ardis Hughs on 09/17/16. (JDP:gt, 09/17/16)      07/25/2016 Initial Diagnosis    Malignant neoplasm of prostate ([name])     09/19/2016 Imaging    CT Chest IMPRESSION: 1. Several small solid pulmonary nodules scattered in both lungs, largest 6 mm, indeterminate but more likely benign given predominantly subpleural location of the nodules. Recommend initial follow-up chest CT in 3 months. 2. No thoracic adenopathy or other potential findings of metastatic disease in the chest. 3. Right upper lobe 1.1 cm ground-glass pulmonary nodule. Initial follow-up with CT at 6-12 months is recommended to confirm persistence. If persistent, repeat CT is recommended every 2 years until 5 years of stability has been established. This recommendation follows the consensus statement: Guidelines  for Management of Incidental Pulmonary Nodules Detected on CT Images: From the Fleischner Society 2017; Radiology 2017; 284:228-243. 4. Mild cardiomegaly. Three-vessel coronary atherosclerosis status post CABG. 5. Multinodular goiter with dominant 3.7 cm right thyroid lobe nodule, for which thyroid ultrasound correlation is warranted, which may be performed if clinically warranted. 6. Re- demonstration of diffuse pancreatic duct dilation due to known pancreatic malignancy.        Pancreatic cancer ([name])   07/08/2016 Imaging    NM Bone Scan Whole Body 07/08/16 IMPRESSION: There are no findings suspicious for metastatic disease to the skeleton.      07/28/2016 Imaging    MR Abd w/wo contrast 07/28/16 IMPRESSION: 1. There is a suspicious lesion involving the head of pancreas and obstructing the pancreatic duct is identified. Involvement of the portal vein and portal venous confluence is suspected. Findings worrisome for pancreatic adenocarcinoma. Further investigation with endoscopic ultrasound and tissue sampling advise. 2. Resolution of bilateral hydronephrosis. Bilateral kidney cysts noted. 3. Aortic atherosclerosis.      08/28/2016 Procedure    EUS by Dr. Ardis Hughs 08/28/16 IMPRESSION:  1.9cm by 1.5cm mass in the head of pancreas causing main pancreatic duct obstruction and directly abutting the portal vein (suggesting invasion). The mass is not obstructing the bile duct. Preliminary reading of FNA shows "very atypical cells."        08/29/2016 Initial Biopsy    Diagnosis 08/29/16 FINE NEEDLE ASPIRATION, ENDOSCOPIC, PANCREAS HEAD(SPECIMEN 1 OF 1 COLLECTED 08/28/16): POORLY DIFFERENTIATED CARCINOMA ASSOCIATED WITH ACUTE INFLAMMATION. SEE COMMENT. Comment: There are a few cells with features suggestive of adenocarcinoma.      09/12/2016 Initial Diagnosis    Pancreatic cancer ([name])     09/19/2016 Imaging    CT Chest WO Contrast 09/19/16 IMPRESSION: 1. Several small solid  pulmonary nodules  scattered in both lungs, largest 6 mm, indeterminate but more likely benign given predominantly subpleural location of the nodules. Recommend initial follow-up chest CT in 3 months. 2. No thoracic adenopathy or other potential findings of metastatic disease in the chest. 3. Right upper lobe 1.1 cm ground-glass pulmonary nodule. Initial follow-up with CT at 6-12 months is recommended to confirm persistence. If persistent, repeat CT is recommended every 2 years until 5 years of stability has been established. This recommendation follows the consensus statement: Guidelines for Management of Incidental Pulmonary Nodules Detected on CT Images: From the Fleischner Society 2017; Radiology 2017; 284:228-243. 4. Mild cardiomegaly. Three-vessel coronary atherosclerosis status post CABG. 5. Multinodular goiter with dominant 3.7 cm right thyroid lobe nodule, for which thyroid ultrasound correlation is warranted, which may be performed if clinically warranted. 6. Re- demonstration of diffuse pancreatic duct dilation due to known pancreatic malignancy.       HISTORY OF PRESENTING ILLNESS: 09/12/16 [name] 71 y.o. male is here because of pancreatic cancer. Approximately one year ago, pt was admitted for urinary retention. Dr Junious Silk met him on service in the hospital. Since then he has been followed by closely [facility] for ongoing BPH with urinary retention. He did have a foley catheter in place for 3-4 months ago until approximately one month ago when this was removed. Pt had a DRE in 06/2016 which showed some induration, he underwent TURP with prostate biopsy following this on 06/27/2016. His pathology revealed Gleason 5+5 disease in all TUR chips with local invasion into the bladder detrusor muscle. He had a PSA performed on 07/11/16 which was 17.5. Pt recently had consult with radiation oncology and he has been taking injections since. He has not been tolerating any other  treatments for his prostate cancer. He also had a CT A/p and NM Bone scan performed on 07/11/16. His bone scan was negative for any osseous metastatic abnormalities; however, his CT scan showed a single pathologically enlarged pelvic lymph node as well as dilation of the main pancreatic duct without discrete pancreatic mass. Subsequently he underwent MR A/p on 07/28/16 which showed a suspicious lesion involving the head of the pancreas and obstructed the pancreatic duct. Involvement of the portal vein and portal venous confluence was suspected, per radiology. Overall, pt is a poor historian.   He was subsequently referred by Dr Ardis Hughs to medical oncology for further management. He is not currently followed by a PCP. He denies abdominal pain, diarrhea, constipation, unexpected weight loss, loss of appetite, chest pain, shortness of breath, fatigue. He is otherwise asymptomatic and feeling at his baseline today. Of note, he states that he did not take his prescribed BP medications this morning, 09/12/16.   He stopped smoking ~13 years ago, and is only an occasional drinker. He is currently retired from [employer]. He is currently single and does have five children, two of which are his step-sons. He states that he lives in an apartment here in [place] with his two youngest sons, ages 57 and 38yo.   CURRENT THERAPY: neoadjuvant Gemcitabine and Abraxane on day 1, 8 every 21 days, will hold Abraxane for first 1-2 doses   INTERVAL HISTORY:  [name] is here for a follow up. He presents to the clinic today alone to discuss CT results and plan. His CT chest on 09/19/16 revealed multiple nodules but no malignancy in the chest. Additional testing of initial biopsy revealed the pancreatic mass is not metastatic prostate cancer. He is informed he has 2  primary cancers. We discussed referral to Dr. Barry Dienes for surgery consult, which was placed on 09/12/16.  MEDICAL HISTORY:  Past Medical History:  Diagnosis  Date  . Acute MI, inferior wall ([place]) 1966  . Amputation finger    right first finger top portion age 77  . CHF (congestive heart failure) ([place])   . Coronary artery disease    severe 3 vessel   . Fall   . Foley catheter in place   . H/O hyperkalemia   . H/O thrombocytosis   . Hip fracture, left ([place])    11/2014  . History of blood transfusion   . History of metabolic acidosis   . History of tobacco abuse   . Hypertension   . Obstructive uropathy   . Pneumonia    childhood   . Prostate cancer ([place])   . S/P VSD repair    09/2009  . Trichomonas infection   . Urethral stricture due to infective diseases classified elsewhere   . Urinary tract infection     SURGICAL HISTORY: Past Surgical History:  Procedure Laterality Date  . CORONARY ARTERY BYPASS GRAFT     times 4  . CYSTOSCOPY W/ RETROGRADES N/A 04/03/2015   Procedure:  BLADDER BIOPSIES;  Surgeon: Festus Aloe, MD;  Location: [facility];  Service: Urology;  Laterality: N/A;  . CYSTOSCOPY W/ RETROGRADES Bilateral 06/27/2016   Procedure: CYSTOSCOPY WITH BILATERAL RETROGRADES;  Surgeon: Festus Aloe, MD;  Location: [facility];  Service: Urology;  Laterality: Bilateral;  . CYSTOSCOPY WITH BIOPSY N/A 06/27/2016   Procedure: CYSTOSCOPY WITH BLADDER BIOPSY URETHRAL BIOPSY;  Surgeon: Festus Aloe, MD;  Location: [facility];  Service: Urology;  Laterality: N/A;  . CYSTOSCOPY WITH URETHRAL DILATATION N/A 04/03/2015   Procedure: CYSTOSCOPY WITH BILATERAL RETROGRADES;  Surgeon: Festus Aloe, MD;  Location: [facility];  Service: Urology;  Laterality: N/A;  . EUS N/A 08/28/2016   Procedure: UPPER ENDOSCOPIC ULTRASOUND (EUS) RADIAL;  Surgeon: Milus Banister, MD;  Location: [facility];  Service: Endoscopy;  Laterality: N/A;  . flexible cystoscopy    . repair of post infarction posterior ventricular septal defect    . TRANSURETHRAL RESECTION OF PROSTATE  06/27/2016   Procedure: TRANSURETHRAL RESECTION OF THE PROSTATE (TURP);  Surgeon:  Festus Aloe, MD;  Location: [facility];  Service: Urology;;    SOCIAL HISTORY: Social History   Social History  . Marital status: Divorced    Spouse name: N/A  . Number of children: N/A  . Years of education: N/A   Occupational History  . Not on file.   Social History Main Topics  . Smoking status: Former Smoker    Packs/day: 1.50    Years: 45.00    Types: Cigarettes    Quit date: 01/13/2010  . Smokeless tobacco: Never Used  . Alcohol use No     Comment: occasional   . Drug use: No  . Sexual activity: Not on file   Other Topics Concern  . Not on file   Social History Narrative  . No narrative on file    FAMILY HISTORY: Family History  Problem Relation Age of Onset  . Cancer Paternal Aunt        unknown type cancer   . Cancer Paternal Uncle        unknown type cancer    ALLERGIES:  is allergic to chlorhexidine gluconate.  MEDICATIONS:  Current Outpatient Prescriptions  Medication Sig Dispense Refill  . amLODipine (NORVASC) 10 MG tablet take 1/2 tablet by mouth once daily  30 tablet 11  . carvedilol (COREG) 12.5 MG tablet Take 1 tablet (12.5 mg total) by mouth 2 (two) times daily with a meal. KEEP OV. 30 tablet 0  . rosuvastatin (CRESTOR) 40 MG tablet Take 1 tablet (40 mg total) by mouth daily. KEEP OV. 30 tablet 0   No current facility-administered medications for this visit.    Facility-Administered Medications Ordered in Other Visits  Medication Dose Route Frequency Provider Last Rate Last Dose  . ceFAZolin (ANCEF) IVPB 2g/100 mL premix  2 g Intravenous 30 min Pre-Op Festus Aloe, MD        REVIEW OF SYSTEMS:  Constitutional: Denies fevers, chills or abnormal night sweats Eyes: Denies blurriness of vision, double vision or watery eyes Ears, nose, mouth, throat, and face: Denies mucositis or sore throat Respiratory: Denies cough, dyspnea or wheezes Cardiovascular: Denies palpitation, chest discomfort or lower extremity swelling Gastrointestinal:   Denies nausea, heartburn or change in bowel habits Skin: Denies abnormal skin rashes Lymphatics: Denies new lymphadenopathy or easy bruising Neurological:Denies numbness, tingling or new weaknesses Behavioral/Psych: Mood is stable, no new changes  All other systems were reviewed with the patient and are negative.  PHYSICAL EXAMINATION:  ECOG PERFORMANCE STATUS: 0 - Asymptomatic  Vitals:   09/23/16 0900  BP: (!) 148/90  Pulse: 69  Resp: (!) 69  Temp: 98.4 F (36.9 C)  SpO2: 99%   Filed Weights   09/23/16 0900  Weight: 207 lb (93.9 kg)   GENERAL:alert, no distress and comfortable SKIN: skin color, texture, turgor are normal, no rashes or significant lesions EYES: normal, conjunctiva are pink and non-injected, sclera clear OROPHARYNX:no exudate, no erythema and lips, buccal mucosa, and tongue normal  NECK: supple, thyroid normal size, non-tender, without nodularity LYMPH:  no palpable lymphadenopathy in the cervical, axillary or inguinal LUNGS: clear to auscultation and percussion with normal breathing effort HEART: regular rate & rhythm and no murmurs and no lower extremity edema ABDOMEN:abdomen soft, non-tender and normal bowel sounds Musculoskeletal:no cyanosis of digits and no clubbing  PSYCH: alert & oriented x 3 with fluent speech NEURO: no focal motor/sensory deficits   LABORATORY DATA:  I have reviewed the data as listed CBC Latest Ref Rng & Units 09/12/2016 06/27/2016 05/09/2016  WBC 4.0 - 10.3 10e3/uL 5.6 - 6.4  Hemoglobin 13.0 - 17.1 g/dL 11.5(L) 11.2(L) 11.0(L)  Hematocrit 38.4 - 49.9 % 35.8(L) - 33.9(L)  Platelets 140 - 400 10e3/uL 338 - 333   CMP Latest Ref Rng & Units 09/12/2016 06/27/2016 05/09/2016  Glucose 70 - 140 mg/dl 97 107(H) 105(H)  BUN 7.0 - 26.0 mg/dL 31.4(H) 33(H) 34(H)  Creatinine 0.7 - 1.3 mg/dL 2.4(H) 2.28(H) 2.20(H)  Sodium 136 - 145 mEq/L 139 138 140  Potassium 3.5 - 5.1 mEq/L 4.0 4.4 4.3  Chloride 101 - 111 mmol/L - 111 113(H)  CO2 22 - 29  mEq/L 23 21(L) 20(L)  Calcium 8.4 - 10.4 mg/dL 10.2 9.5 9.7  Total Protein 6.4 - 8.3 g/dL 8.1 - 8.0  Total Bilirubin 0.20 - 1.20 mg/dL 0.33 - 0.2(L)  Alkaline Phos 40 - 150 U/L 100 - 103  AST 5 - 34 U/L 12 - 15  ALT 0-55 U/L U/L <6 - 12(L)   CA 19-9 09/12/16: 21  PSA:  11/23/2006: 0.72 01/21/16: 0.73 07/11/16: 17.5  PATHOLOGY REPORTS:  Diagnosis 08/29/16 FINE NEEDLE ASPIRATION, ENDOSCOPIC, PANCREAS HEAD(SPECIMEN 1 OF 1 COLLECTED 08/28/16): POORLY DIFFERENTIATED CARCINOMA ASSOCIATED WITH ACUTE INFLAMMATION. SEE COMMENT. Comment: There are a few cells with features suggestive of  adenocarcinoma.  Diagnosis 06/27/16 1. Urethra, biopsy, bulb KERATINIZED SQUAMOUS CELL METAPLASIA AND HYPERPLASIA 2. Ureter, biopsy, right orifice METASTATIC PROSTATIC ADENOCARCINOMA, GLEASON SCORE 5+5=10 INVOLVES 50% OF THE SPECIMEN 3. Urethra, biopsy, prostatic polyp PROSTATIC ADENOCARCINOMA, GLEASON SCORE 5+4=9 INVOLVES 30% OF THE RESECTED TISSUE 4. Bladder, biopsy, posterior METASTATIC PROSTATIC ADENOCARCINOMA, INVOLVING DETRUSOR MUSCLE BENIGN UROTHELIUM WITH POLYPOID CYSTITIS 5. Prostate, chips PROSTATIC ADENOCARCINOMA, GLEASON SCORE 5+5=10 INVOLVES 80% OF THE RESECTED TISSUE Microscopic Comment 5. The neoplasm stains positive for prostein, ck8/18, negative for p63 and ck903, supporting the diagnosis of adenocarcinoma of the prostate.  PROCEDURES:  UPPER ENDOSCOPIC [place] 08/28/16 IMPRESSION:  1.9cm by 1.5cm mass in the head of pancreas causing main pancreatic duct obstruction and directly abutting the portal vein (suggesting invasion). The mass is not obstructing the bile duct. Preliminary reading of FNA shows "very atypical cells."  RADIOGRAPHIC STUDIES: I have personally reviewed the radiological images as listed and agreed with the findings in the report. Ct Chest Wo Contrast  Result Date: 09/19/2016 CLINICAL DATA:  Chest staging. Recent diagnosis of prostate cancer 1 month prior and pancreatic  cancer 2 months prior. EXAM: CT CHEST WITHOUT CONTRAST TECHNIQUE: Multidetector CT imaging of the chest was performed following the standard protocol without IV contrast. COMPARISON:  07/08/2016 CT abdomen/ pelvis. 03/09/2015 chest radiograph. FINDINGS: Cardiovascular: Mild cardiomegaly. No significant pericardial fluid/thickening. Left anterior descending, left circumflex and right coronary atherosclerosis status post CABG. Atherosclerotic nonaneurysmal thoracic aorta. Normal caliber pulmonary arteries. Mediastinum/Nodes: Multiple hypodense bilateral thyroid nodules, largest 3.7 cm in the lower right thyroid lobe. Unremarkable esophagus. No pathologically enlarged axillary, mediastinal or gross hilar lymph nodes, noting limited sensitivity for the detection of hilar adenopathy on this noncontrast study. Lungs/Pleura: No pneumothorax. No pleural effusion. Mild centrilobular and paraseptal emphysema with diffuse bronchial wall thickening. No acute consolidative airspace disease or lung masses. Ground-glass peripheral right upper lobe 1.1 x 1.0 cm pulmonary nodule (series 5/ image 63). Several (at least 8) small solid pulmonary nodules scattered in both lungs, predominantly subpleural, largest 6 mm in the right middle lobe (series 5/ image 84). Upper abdomen: Re- demonstration of diffuse pancreatic duct dilation in the visualized pancreatic body and tail. Musculoskeletal: No aggressive appearing focal osseous lesions. Mild to moderate gynecomastia, asymmetric to the left. IMPRESSION: 1. Several small solid pulmonary nodules scattered in both lungs, largest 6 mm, indeterminate but more likely benign given predominantly subpleural location of the nodules. Recommend initial follow-up chest CT in 3 months. 2. No thoracic adenopathy or other potential findings of metastatic disease in the chest. 3. Right upper lobe 1.1 cm ground-glass pulmonary nodule. Initial follow-up with CT at 6-12 months is recommended to confirm  persistence. If persistent, repeat CT is recommended every 2 years until 5 years of stability has been established. This recommendation follows the consensus statement: Guidelines for Management of Incidental Pulmonary Nodules Detected on CT Images: From the Fleischner Society 2017; Radiology 2017; 284:228-243. 4. Mild cardiomegaly. Three-vessel coronary atherosclerosis status post CABG. 5. Multinodular goiter with dominant 3.7 cm right thyroid lobe nodule, for which thyroid ultrasound correlation is warranted, which may be performed if clinically warranted. 6. Re- demonstration of diffuse pancreatic duct dilation due to known pancreatic malignancy. Aortic Atherosclerosis (ICD10-I70.0) and Emphysema (ICD10-J43.9). Electronically Signed   By: Ilona Sorrel M.D.   On: 09/19/2016 12:49    CT Chest WO Contrast 09/19/16 IMPRESSION: 1. Several small solid pulmonary nodules scattered in both lungs, largest 6 mm, indeterminate but more likely benign given predominantly subpleural location of the nodules.  Recommend initial follow-up chest CT in 3 months. 2. No thoracic adenopathy or other potential findings of metastatic disease in the chest. 3. Right upper lobe 1.1 cm ground-glass pulmonary nodule. Initial follow-up with CT at 6-12 months is recommended to confirm persistence. If persistent, repeat CT is recommended every 2 years until 5 years of stability has been established. This recommendation follows the consensus statement: Guidelines for Management of Incidental Pulmonary Nodules Detected on CT Images: From the Fleischner Society 2017; Radiology 2017; 284:228-243. 4. Mild cardiomegaly. Three-vessel coronary atherosclerosis status post CABG. 5. Multinodular goiter with dominant 3.7 cm right thyroid lobe nodule, for which thyroid ultrasound correlation is warranted, which may be performed if clinically warranted. 6. Re- demonstration of diffuse pancreatic duct dilation due to known pancreatic  malignancy.  MR Abd w/wo contrast 07/28/16 IMPRESSION: 1. There is a suspicious lesion involving the head of pancreas and obstructing the pancreatic duct is identified. Involvement of the portal vein and portal venous confluence is suspected. Findings worrisome for pancreatic adenocarcinoma. Further investigation with endoscopic ultrasound and tissue sampling advise. 2. Resolution of bilateral hydronephrosis. Bilateral kidney cysts noted. 3. Aortic atherosclerosis.  NM Bone Scan Whole Body 07/08/16 IMPRESSION: There are no findings suspicious for metastatic disease to the skeleton.  ASSESSMENT: 71 y.o. African-American male, with past medical history of CAD, and recently diagnosed prostate cancer, gleason score 10, presented with incidental imaging finding of a pancreatic head mass.  1. Carcinoma of the head of the pancreas, [id number],stage IA -I previously reviewed his CT and MRI scan findings, which showed a mass in the pancreatic head, with probable portal vein involvement. The mass measures about 1.9 cm by endoscopy ultrasound, with ultrasound evidence of portal vein invasion. -I previously reviewed his surgical path findings with pt in great details. He did have UEUS on 08/28/16 by Dr Ardis Hughs, biopsy was performed during this which revealed a poorly differentiated carcinoma of the head of the pancreas. Due to his recent prostate cancer, I requested additional IHC, which was negative so prostates metastasis was ruled out  -I informed him that due to the nature and location of his tumor that he would need Whipple surgery to resect it.  -I have consulted Dr. Barry Dienes and we discussed his case in our GI tumor board. According to the ultrasound and MRI, his pancreatic mass appears to be borderline resectable, and will require neoadjuvant chemotherapy and/or radiation. -I discussed the aggressive nature of pancreatic cancer, and high risk of recurrence after surgical resection. Most people need  neoadjuvant or adjuvant chemotherapy. I discussed the benefit of neoadjuvant chemotherapy, which may improve the resectability of his tumor. -he is 47, with coronary artery disease and CKD, however he is asymptomatic, with good performance status, he would be a candidate for chemotherapy. Due to his significant heart disease and CKD, I will favor gemcitabine and abraxane regimen  -We discussed neoadjuvant chemotherapy for 2-3 months to shrink the tumor prior to surgery; he will likely need adjuvant chemo as well for few more months to reduce the risk of recurrence after surgery.  -we discussed possibility of neoadjuvant radiation.  -We discussed surgery is the only way to potentially cure his pancreatic cancer.  -I recommend Gemcitabin once per week; if he tolerates this, will add on Abraxane to be given weekly x3 weeks with 1 week off; if he cannot tolerate, will be changed to 2 weeks on/1 week off. -Chemotherapy consent: Side effects including but does not not limited to, fatigue, nausea, vomiting, diarrhea, hair loss, neuropathy,  fluid retention, renal and kidney dysfunction, neutropenic fever, needed for blood transfusion, bleeding, were discussed with patient in great detail. He agrees to proceed. -The goal of therapy is curative if he is able to get surgery -He has limited caregiver support but lives with his sons who work late hours. One son is at home during daytime hours but works 5pm-11pm. His grandkids live with him as well and picks then up from school around 2 pm.  -He requests chemo to be done early morning.    -we discussed need for [facility] for frequent blood draws and for chemotherapy. He would like to think about it for now -He can receive treatment via peripheral veins for now  -will schedule lab and gemcitabine chemo weekly x3 weeks, office f/u in the second week   2. CHF, HTN, CAD s/p CABGx4 -He is currently followed by Dr Stanford Breed of Cardiology for ongoing management of his cardiac  issues.  -He is currently on Amlodipine, Crestor, and Crestor, but has some issues with compliance.  -his last echo showed EF 45% in 2014, will repeat his echo before chemo   3. Newly diagnosed prostate cancer, gleason score 10  -He presented with urinary obstruction, seen by urologist Dr. Junious Silk, and prostate biopsy showed prostate cancer, Gleason score 10, which is very high risk -his staging CT abdomen and pelvis and a bone scan showed no other evidence of metastasis, except the pancreatic mass -he has started ADT, plan to have prostate seed placement by Dr. Tammi Klippel, which has been held since his pancreatic cancer diagnosis  -I discussed his case with Dr. Tammi Klippel, he agreed treatment of pancreatic cancer is the priority over prostate cancer treatment will coordinate his care with Dr. Tammi Klippel   4. CKD stage III  -f/u PCP, avoid IV contrast and other nephrotoxic  5. Thyroid nodule -likely benign growth -will monitor   6. Lung nodules -discussed CT results with patient in detail -he is asymptomatic -I discussed this is likely related to smoking history and likely benign -will monitor; will likely repeat scan in 3 months   PLAN:  -plan to start weekly lab and gemcitabine and Abraxane chemo x3 weeks, will hold Abraxane for first 1-2 weeks, due to his CKD, I will reduced gemcitabine dosed to 800 mg/m -office f/u in the second week -attending chemo class today   All questions were answered. The patient knows to call the clinic with any problems, questions or concerns. I spent 25 minutes counseling the patient face to face. The total time spent in the appointment was 30 minutes and more than 50% was on counseling.  This document serves as a record of services personally performed by Truitt Merle, MD. It was created on her behalf by Joslyn Devon, a trained medical scribe. The creation of this record is based on the scribe's personal observations and the provider's statements to them. This  document has been checked and approved by the attending provider.    Truitt Merle, MD 09/23/2016 1:42 PM

## 2016-09-23 ENCOUNTER — Telehealth: Payer: Self-pay

## 2016-09-23 ENCOUNTER — Encounter: Payer: Self-pay | Admitting: *Deleted

## 2016-09-23 ENCOUNTER — Ambulatory Visit (HOSPITAL_BASED_OUTPATIENT_CLINIC_OR_DEPARTMENT_OTHER): Payer: Medicare Other | Admitting: Hematology

## 2016-09-23 ENCOUNTER — Other Ambulatory Visit: Payer: Medicare Other

## 2016-09-23 ENCOUNTER — Encounter: Payer: Self-pay | Admitting: Hematology

## 2016-09-23 VITALS — BP 148/90 | HR 69 | Temp 98.4°F | Resp 69 | Wt 207.0 lb

## 2016-09-23 DIAGNOSIS — C61 Malignant neoplasm of prostate: Secondary | ICD-10-CM

## 2016-09-23 DIAGNOSIS — I5022 Chronic systolic (congestive) heart failure: Secondary | ICD-10-CM | POA: Diagnosis not present

## 2016-09-23 DIAGNOSIS — C250 Malignant neoplasm of head of pancreas: Secondary | ICD-10-CM

## 2016-09-23 DIAGNOSIS — N183 Chronic kidney disease, stage 3 (moderate): Secondary | ICD-10-CM

## 2016-09-23 DIAGNOSIS — I25810 Atherosclerosis of coronary artery bypass graft(s) without angina pectoris: Secondary | ICD-10-CM

## 2016-09-23 MED ORDER — PROCHLORPERAZINE MALEATE 10 MG PO TABS: 10.0000 mg | ORAL_TABLET | Freq: Four times a day (QID) | ORAL | 1 refills | Status: DC | PRN

## 2016-09-23 MED ORDER — ONDANSETRON HCL 8 MG PO TABS: 8.0000 mg | ORAL_TABLET | Freq: Two times a day (BID) | ORAL | 1 refills | Status: DC | PRN

## 2016-09-23 NOTE — Telephone Encounter (Signed)
Remove 9/18 appointment until further notice from [facility]. 9/11

## 2016-09-23 NOTE — Progress Notes (Signed)
START ON PATHWAY REGIMEN - Pancreatic     A cycle is every 28 days:     Nab-paclitaxel (protein bound)      Gemcitabine   **Always confirm dose/schedule in your pharmacy ordering system**    Patient Characteristics: Adenocarcinoma, Borderline Resectable or High Risk, Potentially Resectable, Primary Neoadjuvant Therapy, PS = 0, 1 Histology: Adenocarcinoma Current evidence of distant metastases<= No AJCC T Category: T2 AJCC N Category: N0 AJCC M Category: M0 AJCC 8 Stage Grouping: IB Intent of Therapy: Curative Intent, Discussed with Patient

## 2016-09-24 ENCOUNTER — Telehealth: Payer: Self-pay

## 2016-09-24 ENCOUNTER — Telehealth: Payer: Self-pay | Admitting: *Deleted

## 2016-09-24 NOTE — Telephone Encounter (Signed)
Pt has appt with [facility] on 09/29/2016 at 1:45 and 1:15 with Dr Barry Dienes.  Copy given to  Dr Burr Medico

## 2016-09-24 NOTE — Telephone Encounter (Signed)
Scheduled appts based on patient requested time. Per 9/11 los

## 2016-09-25 ENCOUNTER — Telehealth: Payer: Self-pay

## 2016-09-25 NOTE — Telephone Encounter (Signed)
Spoke with patient concerning upcoming appointment for 9/14. Per los.

## 2016-09-26 ENCOUNTER — Ambulatory Visit (HOSPITAL_BASED_OUTPATIENT_CLINIC_OR_DEPARTMENT_OTHER): Payer: Medicare Other

## 2016-09-26 ENCOUNTER — Encounter: Payer: Self-pay | Admitting: Cardiology

## 2016-09-26 ENCOUNTER — Other Ambulatory Visit: Payer: Self-pay

## 2016-09-26 ENCOUNTER — Other Ambulatory Visit (HOSPITAL_BASED_OUTPATIENT_CLINIC_OR_DEPARTMENT_OTHER): Payer: Medicare Other

## 2016-09-26 VITALS — BP 132/79 | HR 56 | Temp 98.1°F | Resp 16

## 2016-09-26 DIAGNOSIS — C250 Malignant neoplasm of head of pancreas: Secondary | ICD-10-CM

## 2016-09-26 DIAGNOSIS — Z5111 Encounter for antineoplastic chemotherapy: Secondary | ICD-10-CM | POA: Diagnosis present

## 2016-09-26 LAB — CBC WITH DIFFERENTIAL/PLATELET
BASO%: 0.5 % (ref 0.0–2.0)
Basophils Absolute: 0 10*3/uL (ref 0.0–0.1)
EOS%: 3.1 % (ref 0.0–7.0)
Eosinophils Absolute: 0.2 10*3/uL (ref 0.0–0.5)
HCT: 35.8 % — ABNORMAL LOW (ref 38.4–49.9)
HGB: 11.6 g/dL — ABNORMAL LOW (ref 13.0–17.1)
LYMPH%: 39.6 % (ref 14.0–49.0)
MCH: 27 pg — ABNORMAL LOW (ref 27.2–33.4)
MCHC: 32.5 g/dL (ref 32.0–36.0)
MCV: 83.2 fL (ref 79.3–98.0)
MONO#: 0.6 10*3/uL (ref 0.1–0.9)
MONO%: 9.3 % (ref 0.0–14.0)
NEUT#: 2.9 10*3/uL (ref 1.5–6.5)
NEUT%: 47.5 % (ref 39.0–75.0)
PLATELETS: 332 10*3/uL (ref 140–400)
RBC: 4.3 10*6/uL (ref 4.20–5.82)
RDW: 15.9 % — ABNORMAL HIGH (ref 11.0–14.6)
WBC: 6.1 10*3/uL (ref 4.0–10.3)
lymph#: 2.4 10*3/uL (ref 0.9–3.3)

## 2016-09-26 LAB — COMPREHENSIVE METABOLIC PANEL
ALT: 7 U/L (ref 0–55)
ANION GAP: 9 meq/L (ref 3–11)
AST: 13 U/L (ref 5–34)
Albumin: 3 g/dL — ABNORMAL LOW (ref 3.5–5.0)
Alkaline Phosphatase: 102 U/L (ref 40–150)
BILIRUBIN TOTAL: 0.22 mg/dL (ref 0.20–1.20)
BUN: 51.3 mg/dL — AB (ref 7.0–26.0)
CO2: 15 meq/L — AB (ref 22–29)
Calcium: 10 mg/dL (ref 8.4–10.4)
Chloride: 115 mEq/L — ABNORMAL HIGH (ref 98–109)
Creatinine: 2.7 mg/dL — ABNORMAL HIGH (ref 0.7–1.3)
EGFR: 26 mL/min/{1.73_m2} — ABNORMAL LOW (ref >90)
GLUCOSE: 105 mg/dL (ref 70–140)
Potassium: 4.2 mEq/L (ref 3.5–5.1)
SODIUM: 139 meq/L (ref 136–145)
TOTAL PROTEIN: 8 g/dL (ref 6.4–8.3)

## 2016-09-26 MED ORDER — PROCHLORPERAZINE MALEATE 10 MG PO TABS
ORAL_TABLET | ORAL | Status: AC
  Filled 2016-09-26: qty 1

## 2016-09-26 MED ORDER — SODIUM CHLORIDE 0.9 % IV SOLN
Freq: Once | INTRAVENOUS | Status: AC
  Administered 2016-09-26: 12:00:00 via INTRAVENOUS

## 2016-09-26 MED ORDER — SODIUM CHLORIDE 0.9 % IV SOLN
800.0000 mg/m2 | Freq: Once | INTRAVENOUS | Status: AC
  Administered 2016-09-26: 1672 mg via INTRAVENOUS
  Filled 2016-09-26: qty 43.97

## 2016-09-26 MED ORDER — PROCHLORPERAZINE MALEATE 10 MG PO TABS
10.0000 mg | ORAL_TABLET | Freq: Once | ORAL | Status: AC
  Administered 2016-09-26: 10 mg via ORAL

## 2016-09-26 NOTE — Patient Instructions (Signed)
[facility] Discharge Instructions for Patients Receiving Chemotherapy  Today you received the following chemotherapy agents:  Gemzar.  To help prevent nausea and vomiting after your treatment, we encourage you to take your nausea medication as directed.   If you develop nausea and vomiting that is not controlled by your nausea medication, call the clinic.   BELOW ARE SYMPTOMS THAT SHOULD BE REPORTED IMMEDIATELY:  *FEVER GREATER THAN 100.5 F  *CHILLS WITH OR WITHOUT FEVER  NAUSEA AND VOMITING THAT IS NOT CONTROLLED WITH YOUR NAUSEA MEDICATION  *UNUSUAL SHORTNESS OF BREATH  *UNUSUAL BRUISING OR BLEEDING  TENDERNESS IN MOUTH AND THROAT WITH OR WITHOUT PRESENCE OF ULCERS  *URINARY PROBLEMS  *BOWEL PROBLEMS  UNUSUAL RASH Items with * indicate a potential emergency and should be followed up as soon as possible.  Feel free to call the clinic you have any questions or concerns. The clinic phone number is [phone].  Please show the [facility] at check-in to the Emergency Department and triage nurse.  Gemcitabine injection What is this medicine? GEMCITABINE (jem SIT a been) is a chemotherapy drug. This medicine is used to treat many types of cancer like breast cancer, lung cancer, pancreatic cancer, and ovarian cancer. This medicine may be used for other purposes; ask your health care provider or pharmacist if you have questions. COMMON BRAND NAME(S): Gemzar What should I tell my health care provider before I take this medicine? They need to know if you have any of these conditions: -blood disorders -infection -kidney disease -liver disease -recent or ongoing radiation therapy -an unusual or allergic reaction to gemcitabine, other chemotherapy, other medicines, foods, dyes, or preservatives -pregnant or trying to get pregnant -breast-feeding How should I use this medicine? This drug is given as an infusion into a vein. It is administered in a  hospital or clinic by a specially trained health care professional. Talk to your pediatrician regarding the use of this medicine in children. Special care may be needed. Overdosage: If you think you have taken too much of this medicine contact a poison control center or emergency room at once. NOTE: This medicine is only for you. Do not share this medicine with others. What if I miss a dose? It is important not to miss your dose. Call your doctor or health care professional if you are unable to keep an appointment. What may interact with this medicine? -medicines to increase blood counts like filgrastim, pegfilgrastim, sargramostim -some other chemotherapy drugs like cisplatin -vaccines Talk to your doctor or health care professional before taking any of these medicines: -acetaminophen -aspirin -ibuprofen -ketoprofen -naproxen This list may not describe all possible interactions. Give your health care provider a list of all the medicines, herbs, non-prescription drugs, or dietary supplements you use. Also tell them if you smoke, drink alcohol, or use illegal drugs. Some items may interact with your medicine. What should I watch for while using this medicine? Visit your doctor for checks on your progress. This drug may make you feel generally unwell. This is not uncommon, as chemotherapy can affect healthy cells as well as cancer cells. Report any side effects. Continue your course of treatment even though you feel ill unless your doctor tells you to stop. In some cases, you may be given additional medicines to help with side effects. Follow all directions for their use. Call your doctor or health care professional for advice if you get a fever, chills or sore throat, or other symptoms of a cold or  flu. Do not treat yourself. This drug decreases your body's ability to fight infections. Try to avoid being around people who are sick. This medicine may increase your risk to bruise or bleed. Call  your doctor or health care professional if you notice any unusual bleeding. Be careful brushing and flossing your teeth or using a toothpick because you may get an infection or bleed more easily. If you have any dental work done, tell your dentist you are receiving this medicine. Avoid taking products that contain aspirin, acetaminophen, ibuprofen, naproxen, or ketoprofen unless instructed by your doctor. These medicines may hide a fever. Women should inform their doctor if they wish to become pregnant or think they might be pregnant. There is a potential for serious side effects to an unborn child. Talk to your health care professional or pharmacist for more information. Do not breast-feed an infant while taking this medicine. What side effects may I notice from receiving this medicine? Side effects that you should report to your doctor or health care professional as soon as possible: -allergic reactions like skin rash, itching or hives, swelling of the face, lips, or tongue -low blood counts - this medicine may decrease the number of white blood cells, red blood cells and platelets. You may be at increased risk for infections and bleeding. -signs of infection - fever or chills, cough, sore throat, pain or difficulty passing urine -signs of decreased platelets or bleeding - bruising, pinpoint red spots on the skin, black, tarry stools, blood in the urine -signs of decreased red blood cells - unusually weak or tired, fainting spells, lightheadedness -breathing problems -chest pain -mouth sores -nausea and vomiting -pain, swelling, redness at site where injected -pain, tingling, numbness in the hands or feet -stomach pain -swelling of ankles, feet, hands -unusual bleeding Side effects that usually do not require medical attention (report to your doctor or health care professional if they continue or are bothersome): -constipation -diarrhea -hair loss -loss of appetite -stomach upset This  list may not describe all possible side effects. Call your doctor for medical advice about side effects. You may report side effects to FDA at 1-800-FDA-1088. Where should I keep my medicine? This drug is given in a hospital or clinic and will not be stored at home. NOTE: This sheet is a summary. It may not cover all possible information. If you have questions about this medicine, talk to your doctor, pharmacist, or health care provider.  2018 Elsevier/Gold Standard (2007-05-11 18:45:54)

## 2016-09-26 NOTE — Progress Notes (Signed)
Noted elevated creatinine. Dr. Burr Medico mentioned this in her last note. Reduced initial dose d/t same.

## 2016-09-26 NOTE — Progress Notes (Signed)
HPI: FU CAD. Patient had an acute inferior MI in September 2011 due to occlusion of a PL branch. Cath with severe 3-V CAD with TIMI-3 flow in the PL branch. Post MI course c/b acute VSD and taken for emergency CABG x4 with LIMA to the LAD, SVG to the diagonal, SVG to the circumflex marginal, and SVG to the PDA as well as repair of ventricular septal defect in September 2011. Echo 3/14 showed EF 45%, LAE, mild MR and no residual VSD. Patient has been diagnosed with pancreatic cancer and prostate cancer. I was asked to evaluate preoperatively prior to [facility]. Since last seen, patient does note some dyspnea on exertion since starting chemotherapy. There is no orthopnea, PND or pedal edema. He does not have significant chest pain but does not have good functional capacity.  Current Outpatient Prescriptions  Medication Sig Dispense Refill  . amLODipine (NORVASC) 10 MG tablet take 1/2 tablet by mouth once daily 30 tablet 11  . carvedilol (COREG) 12.5 MG tablet Take 1 tablet (12.5 mg total) by mouth 2 (two) times daily with a meal. KEEP OV. 30 tablet 0  . ondansetron (ZOFRAN) 8 MG tablet Take 1 tablet (8 mg total) by mouth 2 (two) times daily as needed (Nausea or vomiting). 30 tablet 1  . prochlorperazine (COMPAZINE) 10 MG tablet Take 1 tablet (10 mg total) by mouth every 6 (six) hours as needed (Nausea or vomiting). 30 tablet 1  . rosuvastatin (CRESTOR) 40 MG tablet Take 1 tablet (40 mg total) by mouth daily. KEEP OV. 30 tablet 0   No current facility-administered medications for this visit.    Facility-Administered Medications Ordered in Other Visits  Medication Dose Route Frequency Provider Last Rate Last Dose  . ceFAZolin (ANCEF) IVPB 2g/100 mL premix  2 g Intravenous 30 min Pre-Op Festus Aloe, MD         Past Medical History:  Diagnosis Date  . Acute MI, inferior wall ([place]) 1966  . Amputation finger    right first finger top portion age 15  . CHF (congestive heart failure) ([place])    . Coronary artery disease    severe 3 vessel   . Fall   . Foley catheter in place   . H/O hyperkalemia   . H/O thrombocytosis   . Hip fracture, left ([place])    11/2014  . History of blood transfusion   . History of metabolic acidosis   . History of tobacco abuse   . Hypertension   . Obstructive uropathy   . Pneumonia    childhood   . Prostate cancer ([place])   . S/P VSD repair    09/2009  . Trichomonas infection   . Urethral stricture due to infective diseases classified elsewhere   . Urinary tract infection     Past Surgical History:  Procedure Laterality Date  . CORONARY ARTERY BYPASS GRAFT     times 4  . CYSTOSCOPY W/ RETROGRADES N/A 04/03/2015   Procedure:  BLADDER BIOPSIES;  Surgeon: Festus Aloe, MD;  Location: [facility];  Service: Urology;  Laterality: N/A;  . CYSTOSCOPY W/ RETROGRADES Bilateral 06/27/2016   Procedure: CYSTOSCOPY WITH BILATERAL RETROGRADES;  Surgeon: Festus Aloe, MD;  Location: [facility];  Service: Urology;  Laterality: Bilateral;  . CYSTOSCOPY WITH BIOPSY N/A 06/27/2016   Procedure: CYSTOSCOPY WITH BLADDER BIOPSY URETHRAL BIOPSY;  Surgeon: Festus Aloe, MD;  Location: [facility];  Service: Urology;  Laterality: N/A;  . CYSTOSCOPY WITH URETHRAL DILATATION N/A 04/03/2015  Procedure: CYSTOSCOPY WITH BILATERAL RETROGRADES;  Surgeon: Festus Aloe, MD;  Location: [facility];  Service: Urology;  Laterality: N/A;  . EUS N/A 08/28/2016   Procedure: UPPER ENDOSCOPIC ULTRASOUND (EUS) RADIAL;  Surgeon: Milus Banister, MD;  Location: [facility];  Service: Endoscopy;  Laterality: N/A;  . flexible cystoscopy    . repair of post infarction posterior ventricular septal defect    . TRANSURETHRAL RESECTION OF PROSTATE  06/27/2016   Procedure: TRANSURETHRAL RESECTION OF THE PROSTATE (TURP);  Surgeon: Festus Aloe, MD;  Location: [facility];  Service: Urology;;    Social History   Social History  . Marital status: Divorced    Spouse name: N/A  . Number of  children: N/A  . Years of education: N/A   Occupational History  . Not on file.   Social History Main Topics  . Smoking status: Former Smoker    Packs/day: 1.50    Years: 45.00    Types: Cigarettes    Quit date: 01/13/2010  . Smokeless tobacco: Never Used  . Alcohol use No     Comment: occasional   . Drug use: No  . Sexual activity: Not on file   Other Topics Concern  . Not on file   Social History Narrative  . No narrative on file    Family History  Problem Relation Age of Onset  . Cancer Paternal Aunt        unknown type cancer   . Cancer Paternal Uncle        unknown type cancer    ROS: Weight loss but no fevers or chills, productive cough, hemoptysis, dysphasia, odynophagia, melena, hematochezia, dysuria, hematuria, rash, seizure activity, orthopnea, PND, pedal edema, claudication. Remaining systems are negative.  Physical Exam: Well-developed well-nourished in no acute distress.  Skin is warm and dry.  HEENT is normal.  Neck is supple.  Chest is clear to auscultation with normal expansion.  Cardiovascular exam is regular rate and rhythm.  Abdominal exam nontender or distended. No masses palpated. Extremities show no edema. neuro grossly intact  ECG- Sinus rhythm at a rate of 65. Inferior infarct. Nonspecific ST changes. First-degree AV block. personally reviewed  A/P  1 Coronary artery disease-continue statin. Resume ASA 81 mg daily.  2 ischemic cardiomyopathy-continue beta blocker. Patient not on an ACE inhibitor or ARB because of renal insufficiency. Repeat echo.  3 post myocardial infarction ventricular septal defect-status post repair. Last echocardiogram showed no residual defect. Repeat study.  4 hypertension-blood pressure is controlled. Continue present medications.  5 hyperlipidemia-continue statin.  6 preoperative evaluation prior to [facility]-patient has limited functional capacity. I will arrange a [facility] nuclear study for risk stratification  preoperatively. If low risk for no ischemia he may proceed. Echocardiogram to re-quantify LV function.  7 pancreatic cancer-management per oncology.   Kirk Ruths, MD

## 2016-09-29 DIAGNOSIS — C250 Malignant neoplasm of head of pancreas: Secondary | ICD-10-CM | POA: Diagnosis not present

## 2016-09-30 ENCOUNTER — Other Ambulatory Visit: Payer: Self-pay

## 2016-09-30 ENCOUNTER — Ambulatory Visit: Payer: Self-pay

## 2016-10-01 ENCOUNTER — Telehealth: Payer: Self-pay | Admitting: Cardiology

## 2016-10-01 NOTE — Telephone Encounter (Signed)
Received incoming records from [facility] for upcoming appointment on 10/09/16 @ 9:00am with Dr. Stanford Breed. Records given to [facility] in Medical Records. 10/01/16 ab

## 2016-10-02 NOTE — Progress Notes (Addendum)
[name]  [phone]) [phone] [phone]  Clinic Follow-up  Note   Patient Care Team: Patient, No Pcp Per as PCP - General (General Practice) 10/03/2016   CHIEF COMPLAINTS:  Follow up pancreatic cancer   Oncology History   Cancer Staging Pancreatic cancer [facility]) Staging form: Exocrine Pancreas, AJCC 8th Edition - Clinical: Stage IA (cT1c, cN0, cM0) - Signed by Truitt Merle, MD on 09/14/2016       Malignant neoplasm of prostate ([place])   06/27/2016 Initial Biopsy    Diagnosis 06/27/16 1. Urethra, biopsy, bulb KERATINIZED SQUAMOUS CELL METAPLASIA AND HYPERPLASIA 2. Ureter, biopsy, right orifice METASTATIC PROSTATIC ADENOCARCINOMA, GLEASON SCORE 5+5=10 INVOLVES 50% OF THE SPECIMEN 3. Urethra, biopsy, prostatic polyp PROSTATIC ADENOCARCINOMA, GLEASON SCORE 5+4=9 INVOLVES 30% OF THE RESECTED TISSUE 4. Bladder, biopsy, posterior METASTATIC PROSTATIC ADENOCARCINOMA, INVOLVING DETRUSOR MUSCLE BENIGN UROTHELIUM WITH POLYPOID CYSTITIS 5. Prostate, chips PROSTATIC ADENOCARCINOMA, GLEASON SCORE 5+5=10 INVOLVES 80% OF THE RESECTED TISSUE  ADDENDUM: Immunohistochemistry is attempted on the cell block and there is limited tumor present. The scant tumor present is negative with prostein and prostate specific antigen. Called to Dr. Burr Medico and Dr. Ardis Hughs on 09/17/16. (JDP:gt, 09/17/16)      07/25/2016 Initial Diagnosis    Malignant neoplasm of prostate ([name])     09/19/2016 Imaging    CT Chest IMPRESSION: 1. Several small solid pulmonary nodules scattered in both lungs, largest 6 mm, indeterminate but more likely benign given predominantly subpleural location of the nodules. Recommend initial follow-up chest CT in 3 months. 2. No thoracic adenopathy or other potential findings of metastatic disease in the chest. 3. Right upper lobe 1.1 cm ground-glass pulmonary nodule. Initial follow-up with CT at 6-12 months is recommended to confirm persistence. If persistent,  repeat CT is recommended every 2 years until 5 years of stability has been established. This recommendation follows the consensus statement: Guidelines for Management of Incidental Pulmonary Nodules Detected on CT Images: From the Fleischner Society 2017; Radiology 2017; 284:228-243. 4. Mild cardiomegaly. Three-vessel coronary atherosclerosis status post CABG. 5. Multinodular goiter with dominant 3.7 cm right thyroid lobe nodule, for which thyroid ultrasound correlation is warranted, which may be performed if clinically warranted. 6. Re- demonstration of diffuse pancreatic duct dilation due to known pancreatic malignancy.        Pancreatic cancer ([name])   07/08/2016 Imaging    NM Bone Scan Whole Body 07/08/16 IMPRESSION: There are no findings suspicious for metastatic disease to the skeleton.      07/28/2016 Imaging    MR Abd w/wo contrast 07/28/16 IMPRESSION: 1. There is a suspicious lesion involving the head of pancreas and obstructing the pancreatic duct is identified. Involvement of the portal vein and portal venous confluence is suspected. Findings worrisome for pancreatic adenocarcinoma. Further investigation with endoscopic ultrasound and tissue sampling advise. 2. Resolution of bilateral hydronephrosis. Bilateral kidney cysts noted. 3. Aortic atherosclerosis.      08/28/2016 Procedure    EUS by Dr. Ardis Hughs 08/28/16 IMPRESSION:  1.9cm by 1.5cm mass in the head of pancreas causing main pancreatic duct obstruction and directly abutting the portal vein (suggesting invasion). The mass is not obstructing the bile duct. Preliminary reading of FNA shows "very atypical cells."        08/29/2016 Initial Biopsy    Diagnosis 08/29/16 FINE NEEDLE ASPIRATION, ENDOSCOPIC, PANCREAS HEAD(SPECIMEN 1 OF 1 COLLECTED 08/28/16): POORLY DIFFERENTIATED CARCINOMA ASSOCIATED WITH ACUTE INFLAMMATION. SEE COMMENT. Comment: There are a few cells with features suggestive of adenocarcinoma.  09/12/2016 Initial Diagnosis    Pancreatic cancer ([name])     09/19/2016 Imaging    CT Chest WO Contrast 09/19/16 IMPRESSION: 1. Several small solid pulmonary nodules scattered in both lungs, largest 6 mm, indeterminate but more likely benign given predominantly subpleural location of the nodules. Recommend initial follow-up chest CT in 3 months. 2. No thoracic adenopathy or other potential findings of metastatic disease in the chest. 3. Right upper lobe 1.1 cm ground-glass pulmonary nodule. Initial follow-up with CT at 6-12 months is recommended to confirm persistence. If persistent, repeat CT is recommended every 2 years until 5 years of stability has been established. This recommendation follows the consensus statement: Guidelines for Management of Incidental Pulmonary Nodules Detected on CT Images: From the Fleischner Society 2017; Radiology 2017; 284:228-243. 4. Mild cardiomegaly. Three-vessel coronary atherosclerosis status post CABG. 5. Multinodular goiter with dominant 3.7 cm right thyroid lobe nodule, for which thyroid ultrasound correlation is warranted, which may be performed if clinically warranted. 6. Re- demonstration of diffuse pancreatic duct dilation due to known pancreatic malignancy.      09/26/2016 -  Chemotherapy    neoadjuvant Gemcitabine and Abraxane on day 1, 8 every 3 weeks started 09/26/16, will hold Abraxane for first 1-2 doses, reduced gemcitabine dosed to 800 mg/m       HISTORY OF PRESENTING ILLNESS: 09/12/16 [name] 71 y.o. male is here because of pancreatic cancer. Approximately one year ago, pt was admitted for urinary retention. Dr Junious Silk met him on service in the hospital. Since then he has been followed by closely [facility] for ongoing BPH with urinary retention. He did have a foley catheter in place for 3-4 months ago until approximately one month ago when this was removed. Pt had a DRE in 06/2016 which showed some induration, he  underwent TURP with prostate biopsy following this on 06/27/2016. His pathology revealed Gleason 5+5 disease in all TUR chips with local invasion into the bladder detrusor muscle. He had a PSA performed on 07/11/16 which was 17.5. Pt recently had consult with radiation oncology and he has been taking injections since. He has not been tolerating any other treatments for his prostate cancer. He also had a CT A/p and NM Bone scan performed on 07/11/16. His bone scan was negative for any osseous metastatic abnormalities; however, his CT scan showed a single pathologically enlarged pelvic lymph node as well as dilation of the main pancreatic duct without discrete pancreatic mass. Subsequently he underwent MR A/p on 07/28/16 which showed a suspicious lesion involving the head of the pancreas and obstructed the pancreatic duct. Involvement of the portal vein and portal venous confluence was suspected, per radiology. Overall, pt is a poor historian.   He was subsequently referred by Dr Ardis Hughs to medical oncology for further management. He is not currently followed by a PCP. He denies abdominal pain, diarrhea, constipation, unexpected weight loss, loss of appetite, chest pain, shortness of breath, fatigue. He is otherwise asymptomatic and feeling at his baseline today. Of note, he states that he did not take his prescribed BP medications this morning, 09/12/16.   He stopped smoking ~13 years ago, and is only an occasional drinker. He is currently retired from [employer]. He is currently single and does have five children, two of which are his step-sons. He states that he lives in an apartment here in [place] with his two youngest sons, ages 46 and 31yo.   CURRENT THERAPY: neoadjuvant Gemcitabine and Abraxane on day 1, 8 every 3 weeks  started 09/26/16, will hold Abraxane for first 2 doses, reduced gemcitabine dosed to 800 mg/m  INTERVAL HISTORY:  MATHAN DARROCH is here for a follow up. He presents to the clinic  today by himself. He completed cycle 1 day 1 gemcitabine alone on 09/26/2016. He tolerated the infusion well without allergic reaction. He had moderate fatigue for 1-2 days after treatment requiring him to stay home. His weight is down 6 pounds but reports his appetite remained normal, eating and drinking the same amount prior to treatment. He drinks water, apple juice and milk between meals. He denies fever, cough, chest pain, shortness of breath, nausea, vomiting, constipation, diarrhea, rash, bleeding, or numbness or tingling in hands or feet.  He saw Dr. Barry Dienes this week, based on the description of the procedure he does not know if he will have surgery. He is scheduled to see his cardiologist next week for surgical clearance.   MEDICAL HISTORY:  Past Medical History:  Diagnosis Date  . Acute MI, inferior wall ([place]) 1966  . Amputation finger    right first finger top portion age 70  . CHF (congestive heart failure) ([place])   . Coronary artery disease    severe 3 vessel   . Fall   . Foley catheter in place   . H/O hyperkalemia   . H/O thrombocytosis   . Hip fracture, left ([place])    11/2014  . History of blood transfusion   . History of metabolic acidosis   . History of tobacco abuse   . Hypertension   . Obstructive uropathy   . Pneumonia    childhood   . Prostate cancer ([place])   . S/P VSD repair    09/2009  . Trichomonas infection   . Urethral stricture due to infective diseases classified elsewhere   . Urinary tract infection    SURGICAL HISTORY: Past Surgical History:  Procedure Laterality Date  . CORONARY ARTERY BYPASS GRAFT     times 4  . CYSTOSCOPY W/ RETROGRADES N/A 04/03/2015   Procedure:  BLADDER BIOPSIES;  Surgeon: Festus Aloe, MD;  Location: [facility];  Service: Urology;  Laterality: N/A;  . CYSTOSCOPY W/ RETROGRADES Bilateral 06/27/2016   Procedure: CYSTOSCOPY WITH BILATERAL RETROGRADES;  Surgeon: Festus Aloe, MD;  Location: [facility];  Service: Urology;   Laterality: Bilateral;  . CYSTOSCOPY WITH BIOPSY N/A 06/27/2016   Procedure: CYSTOSCOPY WITH BLADDER BIOPSY URETHRAL BIOPSY;  Surgeon: Festus Aloe, MD;  Location: [facility];  Service: Urology;  Laterality: N/A;  . CYSTOSCOPY WITH URETHRAL DILATATION N/A 04/03/2015   Procedure: CYSTOSCOPY WITH BILATERAL RETROGRADES;  Surgeon: Festus Aloe, MD;  Location: [facility];  Service: Urology;  Laterality: N/A;  . EUS N/A 08/28/2016   Procedure: UPPER ENDOSCOPIC ULTRASOUND (EUS) RADIAL;  Surgeon: Milus Banister, MD;  Location: [facility];  Service: Endoscopy;  Laterality: N/A;  . flexible cystoscopy    . repair of post infarction posterior ventricular septal defect    . TRANSURETHRAL RESECTION OF PROSTATE  06/27/2016   Procedure: TRANSURETHRAL RESECTION OF THE PROSTATE (TURP);  Surgeon: Festus Aloe, MD;  Location: [facility];  Service: Urology;;   SOCIAL HISTORY: Social History   Social History  . Marital status: Divorced    Spouse name: N/A  . Number of children: N/A  . Years of education: N/A   Occupational History  . Not on file.   Social History Main Topics  . Smoking status: Former Smoker    Packs/day: 1.50    Years: 45.00  Types: Cigarettes    Quit date: 01/13/2010  . Smokeless tobacco: Never Used  . Alcohol use No     Comment: occasional   . Drug use: No  . Sexual activity: Not on file   Other Topics Concern  . Not on file   Social History Narrative  . No narrative on file   FAMILY HISTORY: Family History  Problem Relation Age of Onset  . Cancer Paternal Aunt        unknown type cancer   . Cancer Paternal Uncle        unknown type cancer   ALLERGIES:  is allergic to chlorhexidine gluconate.  MEDICATIONS:  Current Outpatient Prescriptions  Medication Sig Dispense Refill  . amLODipine (NORVASC) 10 MG tablet take 1/2 tablet by mouth once daily 30 tablet 11  . carvedilol (COREG) 12.5 MG tablet Take 1 tablet (12.5 mg total) by mouth 2 (two) times daily with a  meal. KEEP OV. 30 tablet 0  . ondansetron (ZOFRAN) 8 MG tablet Take 1 tablet (8 mg total) by mouth 2 (two) times daily as needed (Nausea or vomiting). 30 tablet 1  . prochlorperazine (COMPAZINE) 10 MG tablet Take 1 tablet (10 mg total) by mouth every 6 (six) hours as needed (Nausea or vomiting). 30 tablet 1  . rosuvastatin (CRESTOR) 40 MG tablet Take 1 tablet (40 mg total) by mouth daily. KEEP OV. 30 tablet 0   No current facility-administered medications for this visit.    Facility-Administered Medications Ordered in Other Visits  Medication Dose Route Frequency Provider Last Rate Last Dose  . ceFAZolin (ANCEF) IVPB 2g/100 mL premix  2 g Intravenous 30 min Pre-Op Festus Aloe, MD       REVIEW OF SYSTEMS:  Constitutional: Denies Fatigue, fevers, chills or abnormal night sweats (+) weight loss Eyes: Denies blurriness of vision, double vision or watery eyes Ears, nose, mouth, throat, and face: Denies mucositis or sore throat Respiratory: Denies cough, dyspnea or wheezes Cardiovascular: Denies palpitation, chest discomfort or lower extremity swelling Gastrointestinal:  Denies nausea, vomiting, constipation, diarrhea, heartburn or change in bowel habits. Denies bloating or abdominal distention Skin: Denies abnormal skin rashes Lymphatics: Denies new lymphadenopathy or easy bruising Neurological:Denies numbness, tingling or new weaknesses Behavioral/Psych: Mood is stable, no new changes  All other systems were reviewed with the patient and are negative.  PHYSICAL EXAMINATION:  ECOG PERFORMANCE STATUS: 1 - Symptomatic but completely ambulatory  Vitals:   10/03/16 1059  BP: 134/84  Pulse: 74  Resp: 18  Temp: 97.7 F (36.5 C)  SpO2: 99%   Filed Weights   10/03/16 1059  Weight: 201 lb 6.4 oz (91.4 kg)    GENERAL:alert, no distress and comfortable SKIN: skin color, texture, turgor are normal, no rashes or significant lesions EYES: normal, conjunctiva are pink and non-injected,  sclera clear OROPHARYNX:no exudate, no erythema and lips, buccal mucosa, and tongue normal. Multiple broken teeth NECK: supple, thyroid normal size, non-tender, without nodularity LYMPH:  no palpable cervical or supraclavicular lymphadenopathy  LUNGS: clear to auscultation bilaterally with normal breathing effort HEART: regular rate & rhythm, S1 and S2 present, no murmurs and no lower extremity edema ABDOMEN:abdomen soft, round, non-tender and normal bowel sounds Musculoskeletal:no cyanosis of digits and no clubbing  PSYCH: alert & oriented x 3 with fluent speech NEURO: no focal motor/sensory deficits  LABORATORY DATA:  I have reviewed the data as listed CBC Latest Ref Rng & Units 10/03/2016 09/26/2016 09/12/2016  WBC 4.0 - 10.3 10e3/uL 4.1 6.1 5.6  Hemoglobin 13.0 - 17.1 g/dL 10.6(L) 11.6(L) 11.5(L)  Hematocrit 38.4 - 49.9 % 32.2(L) 35.8(L) 35.8(L)  Platelets 140 - 400 10e3/uL 198 332 338   CMP Latest Ref Rng & Units 10/03/2016 09/26/2016 09/12/2016  Glucose 70 - 140 mg/dl 146(H) 105 97  BUN 7.0 - 26.0 mg/dL 66.5(H) 51.3(H) 31.4(H)  Creatinine 0.7 - 1.3 mg/dL 3.4(HH) 2.7(H) 2.4(H)  Sodium 136 - 145 mEq/L 134(L) 139 139  Potassium 3.5 - 5.1 mEq/L 3.8 4.2 4.0  Chloride 101 - 111 mmol/L - - -  CO2 22 - 29 mEq/L 12(L) 15(L) 23  Calcium 8.4 - 10.4 mg/dL 10.1 10.0 10.2  Total Protein 6.4 - 8.3 g/dL 8.5(H) 8.0 8.1  Total Bilirubin 0.20 - 1.20 mg/dL 0.37 0.22 0.33  Alkaline Phos 40 - 150 U/L 100 102 100  AST 5 - 34 U/L _0 ALT 0 - 55 U/L 16 7 <6   CA 19-9 09/12/16: 21  PSA:  11/23/2006: 0.72 01/21/16: 0.73 07/11/16: 17.5  PATHOLOGY REPORTS:  Diagnosis 08/29/16 FINE NEEDLE ASPIRATION, ENDOSCOPIC, PANCREAS HEAD(SPECIMEN 1 OF 1 COLLECTED 08/28/16): POORLY DIFFERENTIATED CARCINOMA ASSOCIATED WITH ACUTE INFLAMMATION. SEE COMMENT. Comment: There are a few cells with features suggestive of adenocarcinoma.  Diagnosis 06/27/16 1. Urethra, biopsy, bulb KERATINIZED SQUAMOUS CELL  METAPLASIA AND HYPERPLASIA 2. Ureter, biopsy, right orifice METASTATIC PROSTATIC ADENOCARCINOMA, GLEASON SCORE 5+5=10 INVOLVES 50% OF THE SPECIMEN 3. Urethra, biopsy, prostatic polyp PROSTATIC ADENOCARCINOMA, GLEASON SCORE 5+4=9 INVOLVES 30% OF THE RESECTED TISSUE 4. Bladder, biopsy, posterior METASTATIC PROSTATIC ADENOCARCINOMA, INVOLVING DETRUSOR MUSCLE BENIGN UROTHELIUM WITH POLYPOID CYSTITIS 5. Prostate, chips PROSTATIC ADENOCARCINOMA, GLEASON SCORE 5+5=10 INVOLVES 80% OF THE RESECTED TISSUE Microscopic Comment 5. The neoplasm stains positive for prostein, ck8/18, negative for p63 and ck903, supporting the diagnosis of adenocarcinoma of the prostate.  PROCEDURES:  UPPER ENDOSCOPIC [place] 08/28/16 IMPRESSION:  1.9cm by 1.5cm mass in the head of pancreas causing main pancreatic duct obstruction and directly abutting the portal vein (suggesting invasion). The mass is not obstructing the bile duct. Preliminary reading of FNA shows "very atypical cells."  RADIOGRAPHIC STUDIES: I have personally reviewed the radiological images as listed and agreed with the findings in the report. Ct Chest Wo Contrast  Result Date: 09/19/2016 CLINICAL DATA:  Chest staging. Recent diagnosis of prostate cancer 1 month prior and pancreatic cancer 2 months prior. EXAM: CT CHEST WITHOUT CONTRAST TECHNIQUE: Multidetector CT imaging of the chest was performed following the standard protocol without IV contrast. COMPARISON:  07/08/2016 CT abdomen/ pelvis. 03/09/2015 chest radiograph. FINDINGS: Cardiovascular: Mild cardiomegaly. No significant pericardial fluid/thickening. Left anterior descending, left circumflex and right coronary atherosclerosis status post CABG. Atherosclerotic nonaneurysmal thoracic aorta. Normal caliber pulmonary arteries. Mediastinum/Nodes: Multiple hypodense bilateral thyroid nodules, largest 3.7 cm in the lower right thyroid lobe. Unremarkable esophagus. No pathologically enlarged axillary,  mediastinal or gross hilar lymph nodes, noting limited sensitivity for the detection of hilar adenopathy on this noncontrast study. Lungs/Pleura: No pneumothorax. No pleural effusion. Mild centrilobular and paraseptal emphysema with diffuse bronchial wall thickening. No acute consolidative airspace disease or lung masses. Ground-glass peripheral right upper lobe 1.1 x 1.0 cm pulmonary nodule (series 5/ image 63). Several (at least 8) small solid pulmonary nodules scattered in both lungs, predominantly subpleural, largest 6 mm in the right middle lobe (series 5/ image 84). Upper abdomen: Re- demonstration of diffuse pancreatic duct dilation in the visualized pancreatic body and tail. Musculoskeletal: No aggressive appearing focal osseous lesions. Mild to moderate gynecomastia, asymmetric to the left. IMPRESSION: 1. Several small  solid pulmonary nodules scattered in both lungs, largest 6 mm, indeterminate but more likely benign given predominantly subpleural location of the nodules. Recommend initial follow-up chest CT in 3 months. 2. No thoracic adenopathy or other potential findings of metastatic disease in the chest. 3. Right upper lobe 1.1 cm ground-glass pulmonary nodule. Initial follow-up with CT at 6-12 months is recommended to confirm persistence. If persistent, repeat CT is recommended every 2 years until 5 years of stability has been established. This recommendation follows the consensus statement: Guidelines for Management of Incidental Pulmonary Nodules Detected on CT Images: From the Fleischner Society 2017; Radiology 2017; 284:228-243. 4. Mild cardiomegaly. Three-vessel coronary atherosclerosis status post CABG. 5. Multinodular goiter with dominant 3.7 cm right thyroid lobe nodule, for which thyroid ultrasound correlation is warranted, which may be performed if clinically warranted. 6. Re- demonstration of diffuse pancreatic duct dilation due to known pancreatic malignancy. Aortic Atherosclerosis  (ICD10-I70.0) and Emphysema (ICD10-J43.9). Electronically Signed   By: Ilona Sorrel M.D.   On: 09/19/2016 12:49    CT Chest WO Contrast 09/19/16 IMPRESSION: 1. Several small solid pulmonary nodules scattered in both lungs, largest 6 mm, indeterminate but more likely benign given predominantly subpleural location of the nodules. Recommend initial follow-up chest CT in 3 months. 2. No thoracic adenopathy or other potential findings of metastatic disease in the chest. 3. Right upper lobe 1.1 cm ground-glass pulmonary nodule. Initial follow-up with CT at 6-12 months is recommended to confirm persistence. If persistent, repeat CT is recommended every 2 years until 5 years of stability has been established. This recommendation follows the consensus statement: Guidelines for Management of Incidental Pulmonary Nodules Detected on CT Images: From the Fleischner Society 2017; Radiology 2017; 284:228-243. 4. Mild cardiomegaly. Three-vessel coronary atherosclerosis status post CABG. 5. Multinodular goiter with dominant 3.7 cm right thyroid lobe nodule, for which thyroid ultrasound correlation is warranted, which may be performed if clinically warranted. 6. Re- demonstration of diffuse pancreatic duct dilation due to known pancreatic malignancy.  MR Abd w/wo contrast 07/28/16 IMPRESSION: 1. There is a suspicious lesion involving the head of pancreas and obstructing the pancreatic duct is identified. Involvement of the portal vein and portal venous confluence is suspected. Findings worrisome for pancreatic adenocarcinoma. Further investigation with endoscopic ultrasound and tissue sampling advise. 2. Resolution of bilateral hydronephrosis. Bilateral kidney cysts noted. 3. Aortic atherosclerosis.  NM Bone Scan Whole Body 07/08/16 IMPRESSION: There are no findings suspicious for metastatic disease to the skeleton.  ASSESSMENT: 71 y.o. African-American male, with past medical history of CAD,  and recently diagnosed prostate cancer, gleason score 10, presented with incidental imaging finding of a pancreatic head mass.  1. Carcinoma of the head of the pancreas, cT1cN0M0,stage IA -I previously reviewed his CT and MRI scan findings, which showed a mass in the pancreatic head, with probable portal vein involvement. The mass measures about 1.9 cm by endoscopy ultrasound, with ultrasound evidence of portal vein invasion. -I previously reviewed his surgical path findings with pt in great details. He did have UEUS on 08/28/16 by Dr Ardis Hughs, biopsy was performed during this which revealed a poorly differentiated carcinoma of the head of the pancreas. Due to his recent prostate cancer, I requested additional IHC, which was negative so prostates metastasis was ruled out  -I informed him that due to the nature and location of his tumor that he would need Whipple surgery to resect it.  -According to the ultrasound and MRI, his pancreatic mass appears to be borderline resectable, and will require  neoadjuvant chemotherapy and/or radiation. He has seen Dr. Barry Dienes, he will think about surgery -I discussed the aggressive nature of pancreatic cancer, and high risk of recurrence after surgical resection. Most people need neoadjuvant or adjuvant chemotherapy. I discussed the benefit of neoadjuvant chemotherapy, which may improve the resectability of his tumor. -he is 37, with coronary artery disease and CKD, however he is asymptomatic, with good performance status, he would be a candidate for chemotherapy. Due to his significant heart disease and CKD, I will favor gemcitabine and abraxane regimen  -We discussed neoadjuvant chemotherapy for 2-3 months to shrink the tumor prior to surgery; he will likely need adjuvant chemo as well for few more months to reduce the risk of recurrence after surgery.  -we discussed possibility of neoadjuvant radiation.  -We discussed surgery is the only way to potentially cure his  pancreatic cancer.  -I recommend Gemcitabine once per week; if he tolerates this, will add on Abraxane to be given weekly x3 weeks with 1 week off; if he cannot tolerate, will be changed to 2 weeks on/1 week off. -He consented to chemotherapy; the goal of therapy is curative if he is able to get surgery -He has limited caregiver support but lives with his sons who work late hours. One son is at home during daytime hours but works 5pm-11pm. His grandkids live with him as well and picks then up from school around 2 pm.   -we discussed need for [facility] for frequent blood draws and for chemotherapy. He would like to think about it for now -He can receive treatment via peripheral veins for now   -He experienced moderate fatigue, weight loss, and decreased renal function with cycle 1 day 1 gemcitabine, we'll continue with single agent gemcitabine for now -We reviewed his labs, CBC adequate for treatment. He is mildly anemic, Hgb 10.6, he does not need a transfusion; CMP is abnormal with elevated creatinine 3.4; he has history of CKD -He is not able to stay for chemotherapy today, he has to pick up his grandkids from school at 12:30. We will reschedule him for early next week on Monday or Tuesday in the morning  2. CHF, HTN, CAD s/p CABGx4 -He is currently followed by Dr [facility] Breed of Cardiology for ongoing management of his cardiac issues.  -He is currently on Amlodipine, Crestor, and Crestor, but has some issues with compliance.  -his last echo showed EF 45% in 2014, will repeat his echo before chemo  -He has apt coming up for cardiology clearance for possible whipple surgery  3. Newly diagnosed prostate cancer, gleason score 10  -He presented with urinary obstruction, seen by urologist Dr. Junious Silk, and prostate biopsy showed prostate cancer, Gleason score 10, which is very high risk -his staging CT abdomen and pelvis and a bone scan showed no other evidence of metastasis, except the pancreatic mass -he has  started ADT, plan to have prostate seed placement by Dr. Tammi Klippel, which has been held since his pancreatic cancer diagnosis  -Dr. Tammi Klippel and Dr. Burr Medico agreed treatment of pancreatic cancer is the priority over prostate cancer treatment will coordinate his care with Dr. Tammi Klippel   4. CKD stage III  -Creatinine has been elevated to 9.41 in 11/17/15; GFR was 6 at that time -f/u PCP, avoid IV contrast and other nephrotoxic agents; he has not started new medication and does not use NSAIDs -Creatinine increased to 3.4, likely secondary to chemotherapy and dehydration; he is receiving gemcitabine at 836m/m2 due to CKD -I recommend giving  750cc NS over 2 hours today with chemo but patient cannot stay for infusion due to childcare obligations and cannot come on Saturday for IVF -will reschedule for early next week.   5. Thyroid nodule -likely benign growth -will monitor   6. Lung nodules -discussed CT results with patient in detail -he is asymptomatic -I discussed this is likely related to smoking history and likely benign -will monitor; will likely repeat scan in 3 months   PLAN:  -reschedule chemo gemcitabine alone and IVF to Monday or Tuesday of next week -Lab, f/u, chemo gemcitabine 10/2 -patient needs early appointments due to child care obligations  -f/u with PCP and cardiology  -will call his son to update his condition   All questions were answered. The patient knows to call the clinic with any problems, questions or concerns. I spent 25 minutes counseling the patient face to face. The total time spent in the appointment was 30 minutes and more than 50% was on counseling.  Cira Rue, AGNP-C 10/03/2016  I have seen the patient, examined him. I agree with the assessment and and plan and have edited the notes.   Truitt Merle  10/03/2016

## 2016-10-03 ENCOUNTER — Telehealth: Payer: Self-pay | Admitting: Hematology

## 2016-10-03 ENCOUNTER — Ambulatory Visit (HOSPITAL_BASED_OUTPATIENT_CLINIC_OR_DEPARTMENT_OTHER): Payer: Medicare Other | Admitting: Hematology

## 2016-10-03 ENCOUNTER — Ambulatory Visit: Payer: Medicare Other

## 2016-10-03 ENCOUNTER — Other Ambulatory Visit (HOSPITAL_BASED_OUTPATIENT_CLINIC_OR_DEPARTMENT_OTHER): Payer: Medicare Other

## 2016-10-03 ENCOUNTER — Encounter: Payer: Self-pay | Admitting: Hematology

## 2016-10-03 VITALS — BP 134/84 | HR 74 | Temp 97.7°F | Resp 18 | Ht 66.0 in | Wt 201.4 lb

## 2016-10-03 DIAGNOSIS — I25810 Atherosclerosis of coronary artery bypass graft(s) without angina pectoris: Secondary | ICD-10-CM | POA: Diagnosis not present

## 2016-10-03 DIAGNOSIS — C250 Malignant neoplasm of head of pancreas: Secondary | ICD-10-CM | POA: Diagnosis not present

## 2016-10-03 LAB — CBC WITH DIFFERENTIAL/PLATELET
BASO%: 0.2 % (ref 0.0–2.0)
BASOS ABS: 0 10*3/uL (ref 0.0–0.1)
EOS ABS: 0 10*3/uL (ref 0.0–0.5)
EOS%: 1 % (ref 0.0–7.0)
HCT: 32.2 % — ABNORMAL LOW (ref 38.4–49.9)
HEMOGLOBIN: 10.6 g/dL — AB (ref 13.0–17.1)
LYMPH#: 1.8 10*3/uL (ref 0.9–3.3)
LYMPH%: 43.6 % (ref 14.0–49.0)
MCH: 26.9 pg — AB (ref 27.2–33.4)
MCHC: 32.9 g/dL (ref 32.0–36.0)
MCV: 81.7 fL (ref 79.3–98.0)
MONO#: 0.2 10*3/uL (ref 0.1–0.9)
MONO%: 5.4 % (ref 0.0–14.0)
NEUT#: 2.1 10*3/uL (ref 1.5–6.5)
NEUT%: 49.8 % (ref 39.0–75.0)
PLATELETS: 198 10*3/uL (ref 140–400)
RBC: 3.94 10*6/uL — ABNORMAL LOW (ref 4.20–5.82)
RDW: 14.9 % — AB (ref 11.0–14.6)
WBC: 4.1 10*3/uL (ref 4.0–10.3)

## 2016-10-03 LAB — COMPREHENSIVE METABOLIC PANEL
ALBUMIN: 2.8 g/dL — AB (ref 3.5–5.0)
ALT: 16 U/L (ref 0–55)
ANION GAP: 10 meq/L (ref 3–11)
AST: 19 U/L (ref 5–34)
Alkaline Phosphatase: 100 U/L (ref 40–150)
BUN: 66.5 mg/dL — AB (ref 7.0–26.0)
CALCIUM: 10.1 mg/dL (ref 8.4–10.4)
CO2: 12 mEq/L — ABNORMAL LOW (ref 22–29)
CREATININE: 3.4 mg/dL — AB (ref 0.7–1.3)
Chloride: 112 mEq/L — ABNORMAL HIGH (ref 98–109)
EGFR: 20 mL/min/{1.73_m2} — ABNORMAL LOW (ref >90)
Glucose: 146 mg/dl — ABNORMAL HIGH (ref 70–140)
POTASSIUM: 3.8 meq/L (ref 3.5–5.1)
Sodium: 134 mEq/L — ABNORMAL LOW (ref 136–145)
Total Bilirubin: 0.37 mg/dL (ref 0.20–1.20)
Total Protein: 8.5 g/dL — ABNORMAL HIGH (ref 6.4–8.3)

## 2016-10-03 NOTE — Telephone Encounter (Signed)
Scheduled appt per 9/21 los - patient is aware and will pick new schedule next visit on Monday 9/24

## 2016-10-06 ENCOUNTER — Ambulatory Visit (HOSPITAL_BASED_OUTPATIENT_CLINIC_OR_DEPARTMENT_OTHER): Payer: Medicare Other

## 2016-10-06 VITALS — BP 136/83 | HR 59 | Temp 98.1°F | Resp 17

## 2016-10-06 DIAGNOSIS — Z5111 Encounter for antineoplastic chemotherapy: Secondary | ICD-10-CM | POA: Diagnosis present

## 2016-10-06 DIAGNOSIS — C250 Malignant neoplasm of head of pancreas: Secondary | ICD-10-CM | POA: Diagnosis not present

## 2016-10-06 MED ORDER — SODIUM CHLORIDE 0.9% FLUSH
10.0000 mL | INTRAVENOUS | Status: DC | PRN
  Filled 2016-10-06: qty 10

## 2016-10-06 MED ORDER — SODIUM CHLORIDE 0.9 % IV SOLN
800.0000 mg/m2 | Freq: Once | INTRAVENOUS | Status: AC
  Administered 2016-10-06: 1672 mg via INTRAVENOUS
  Filled 2016-10-06: qty 43.97

## 2016-10-06 MED ORDER — SODIUM CHLORIDE 0.9 % IV SOLN
Freq: Once | INTRAVENOUS | Status: AC
  Administered 2016-10-06: 08:00:00 via INTRAVENOUS

## 2016-10-06 MED ORDER — PROCHLORPERAZINE MALEATE 10 MG PO TABS
10.0000 mg | ORAL_TABLET | Freq: Once | ORAL | Status: AC
  Administered 2016-10-06: 10 mg via ORAL

## 2016-10-06 MED ORDER — PROCHLORPERAZINE MALEATE 10 MG PO TABS
ORAL_TABLET | ORAL | Status: AC
  Filled 2016-10-06: qty 1

## 2016-10-06 NOTE — Patient Instructions (Signed)
[facility] Discharge Instructions for Patients Receiving Chemotherapy  Today you received the following chemotherapy agents:  Gemzar.  To help prevent nausea and vomiting after your treatment, we encourage you to take your nausea medication as directed.   If you develop nausea and vomiting that is not controlled by your nausea medication, call the clinic.   BELOW ARE SYMPTOMS THAT SHOULD BE REPORTED IMMEDIATELY:  *FEVER GREATER THAN 100.5 F  *CHILLS WITH OR WITHOUT FEVER  NAUSEA AND VOMITING THAT IS NOT CONTROLLED WITH YOUR NAUSEA MEDICATION  *UNUSUAL SHORTNESS OF BREATH  *UNUSUAL BRUISING OR BLEEDING  TENDERNESS IN MOUTH AND THROAT WITH OR WITHOUT PRESENCE OF ULCERS  *URINARY PROBLEMS  *BOWEL PROBLEMS  UNUSUAL RASH Items with * indicate a potential emergency and should be followed up as soon as possible.  Feel free to call the clinic you have any questions or concerns. The clinic phone number is [phone].  Please show the [facility] at check-in to the Emergency Department and triage nurse.  Gemcitabine injection What is this medicine? GEMCITABINE (jem SIT a been) is a chemotherapy drug. This medicine is used to treat many types of cancer like breast cancer, lung cancer, pancreatic cancer, and ovarian cancer. This medicine may be used for other purposes; ask your health care provider or pharmacist if you have questions. COMMON BRAND NAME(S): Gemzar What should I tell my health care provider before I take this medicine? They need to know if you have any of these conditions: -blood disorders -infection -kidney disease -liver disease -recent or ongoing radiation therapy -an unusual or allergic reaction to gemcitabine, other chemotherapy, other medicines, foods, dyes, or preservatives -pregnant or trying to get pregnant -breast-feeding How should I use this medicine? This drug is given as an infusion into a vein. It is administered in a  hospital or clinic by a specially trained health care professional. Talk to your pediatrician regarding the use of this medicine in children. Special care may be needed. Overdosage: If you think you have taken too much of this medicine contact a poison control center or emergency room at once. NOTE: This medicine is only for you. Do not share this medicine with others. What if I miss a dose? It is important not to miss your dose. Call your doctor or health care professional if you are unable to keep an appointment. What may interact with this medicine? -medicines to increase blood counts like filgrastim, pegfilgrastim, sargramostim -some other chemotherapy drugs like cisplatin -vaccines Talk to your doctor or health care professional before taking any of these medicines: -acetaminophen -aspirin -ibuprofen -ketoprofen -naproxen This list may not describe all possible interactions. Give your health care provider a list of all the medicines, herbs, non-prescription drugs, or dietary supplements you use. Also tell them if you smoke, drink alcohol, or use illegal drugs. Some items may interact with your medicine. What should I watch for while using this medicine? Visit your doctor for checks on your progress. This drug may make you feel generally unwell. This is not uncommon, as chemotherapy can affect healthy cells as well as cancer cells. Report any side effects. Continue your course of treatment even though you feel ill unless your doctor tells you to stop. In some cases, you may be given additional medicines to help with side effects. Follow all directions for their use. Call your doctor or health care professional for advice if you get a fever, chills or sore throat, or other symptoms of a cold or  flu. Do not treat yourself. This drug decreases your body's ability to fight infections. Try to avoid being around people who are sick. This medicine may increase your risk to bruise or bleed. Call  your doctor or health care professional if you notice any unusual bleeding. Be careful brushing and flossing your teeth or using a toothpick because you may get an infection or bleed more easily. If you have any dental work done, tell your dentist you are receiving this medicine. Avoid taking products that contain aspirin, acetaminophen, ibuprofen, naproxen, or ketoprofen unless instructed by your doctor. These medicines may hide a fever. Women should inform their doctor if they wish to become pregnant or think they might be pregnant. There is a potential for serious side effects to an unborn child. Talk to your health care professional or pharmacist for more information. Do not breast-feed an infant while taking this medicine. What side effects may I notice from receiving this medicine? Side effects that you should report to your doctor or health care professional as soon as possible: -allergic reactions like skin rash, itching or hives, swelling of the face, lips, or tongue -low blood counts - this medicine may decrease the number of white blood cells, red blood cells and platelets. You may be at increased risk for infections and bleeding. -signs of infection - fever or chills, cough, sore throat, pain or difficulty passing urine -signs of decreased platelets or bleeding - bruising, pinpoint red spots on the skin, black, tarry stools, blood in the urine -signs of decreased red blood cells - unusually weak or tired, fainting spells, lightheadedness -breathing problems -chest pain -mouth sores -nausea and vomiting -pain, swelling, redness at site where injected -pain, tingling, numbness in the hands or feet -stomach pain -swelling of ankles, feet, hands -unusual bleeding Side effects that usually do not require medical attention (report to your doctor or health care professional if they continue or are bothersome): -constipation -diarrhea -hair loss -loss of appetite -stomach upset This  list may not describe all possible side effects. Call your doctor for medical advice about side effects. You may report side effects to FDA at 1-800-FDA-1088. Where should I keep my medicine? This drug is given in a hospital or clinic and will not be stored at home. NOTE: This sheet is a summary. It may not cover all possible information. If you have questions about this medicine, talk to your doctor, pharmacist, or health care provider.  2018 Elsevier/Gold Standard (2007-05-11 18:45:54)    Dehydration, Adult Dehydration is when there is not enough fluid or water in your body. This happens when you lose more fluids than you take in. Dehydration can range from mild to very bad. It should be treated right away to keep it from getting very bad. Symptoms of mild dehydration may include:  Thirst.  Dry lips.  Slightly dry mouth.  Dry, warm skin.  Dizziness. Symptoms of moderate dehydration may include:  Very dry mouth.  Muscle cramps.  Dark pee (urine). Pee may be the color of tea.  Your body making less pee.  Your eyes making fewer tears.  Heartbeat that is uneven or faster than normal (palpitations).  Headache.  Light-headedness, especially when you stand up from sitting.  Fainting (syncope). Symptoms of very bad dehydration may include:  Changes in skin, such as: ? Cold and clammy skin. ? Blotchy (mottled) or pale skin. ? Skin that does not quickly return to normal after being lightly pinched and let go (poor skin turgor).  Changes in body fluids, such as: ? Feeling very thirsty. ? Your eyes making fewer tears. ? Not sweating when body temperature is high, such as in hot weather. ? Your body making very little pee.  Changes in vital signs, such as: ? Weak pulse. ? Pulse that is more than 100 beats a minute when you are sitting still. ? Fast breathing. ? Low blood pressure.  Other changes, such as: ? Sunken eyes. ? Cold hands and feet. ? Confusion. ? Lack  of energy (lethargy). ? Trouble waking up from sleep. ? Short-term weight loss. ? Unconsciousness. Follow these instructions at home:  If told by your doctor, drink an ORS: ? Make an ORS by using instructions on the package. ? Start by drinking small amounts, about  cup (120 mL) every 5-10 minutes. ? Slowly drink more until you have had the amount that your doctor said to have.  Drink enough clear fluid to keep your pee clear or pale yellow. If you were told to drink an ORS, finish the ORS first, then start slowly drinking clear fluids. Drink fluids such as: ? Water. Do not drink only water by itself. Doing that can make the salt (sodium) level in your body get too low (hyponatremia). ? Ice chips. ? Fruit juice that you have added water to (diluted). ? Low-calorie sports drinks.  Avoid: ? Alcohol. ? Drinks that have a lot of sugar. These include high-calorie sports drinks, fruit juice that does not have water added, and soda. ? Caffeine. ? Foods that are greasy or have a lot of fat or sugar.  Take over-the-counter and prescription medicines only as told by your doctor.  Do not take salt tablets. Doing that can make the salt level in your body get too high (hypernatremia).  Eat foods that have minerals (electrolytes). Examples include bananas, oranges, potatoes, tomatoes, and spinach.  Keep all follow-up visits as told by your doctor. This is important. Contact a doctor if:  You have belly (abdominal) pain that: ? Gets worse. ? Stays in one area (localizes).  You have a rash.  You have a stiff neck.  You get angry or annoyed more easily than normal (irritability).  You are more sleepy than normal.  You have a harder time waking up than normal.  You feel: ? Weak. ? Dizzy. ? Very thirsty.  You have peed (urinated) only a small amount of very dark pee during 6-8 hours. Get help right away if:  You have symptoms of very bad dehydration.  You cannot drink fluids  without throwing up (vomiting).  Your symptoms get worse with treatment.  You have a fever.  You have a very bad headache.  You are throwing up or having watery poop (diarrhea) and it: ? Gets worse. ? Does not go away.  You have blood or something green (bile) in your throw-up.  You have blood in your poop (stool). This may cause poop to look black and tarry.  You have not peed in 6-8 hours.  You pass out (faint).  Your heart rate when you are sitting still is more than 100 beats a minute.  You have trouble breathing. This information is not intended to replace advice given to you by your health care provider. Make sure you discuss any questions you have with your health care provider. Document Released: 10/26/2008 Document Revised: 07/20/2015 Document Reviewed: 02/23/2015 Elsevier Interactive Patient Education  2018 [employer].

## 2016-10-06 NOTE — Progress Notes (Signed)
Per Dr Burr Medico ok to tx today with Creatinine of 3.4

## 2016-10-09 ENCOUNTER — Ambulatory Visit (INDEPENDENT_AMBULATORY_CARE_PROVIDER_SITE_OTHER): Payer: Medicare Other | Admitting: Cardiology

## 2016-10-09 ENCOUNTER — Encounter: Payer: Self-pay | Admitting: Cardiology

## 2016-10-09 VITALS — BP 118/72 | HR 65 | Ht 66.0 in | Wt 199.0 lb

## 2016-10-09 DIAGNOSIS — I2510 Atherosclerotic heart disease of native coronary artery without angina pectoris: Secondary | ICD-10-CM

## 2016-10-09 DIAGNOSIS — I255 Ischemic cardiomyopathy: Secondary | ICD-10-CM

## 2016-10-09 DIAGNOSIS — E7800 Pure hypercholesterolemia, unspecified: Secondary | ICD-10-CM

## 2016-10-09 DIAGNOSIS — Z01818 Encounter for other preprocedural examination: Secondary | ICD-10-CM | POA: Diagnosis not present

## 2016-10-09 DIAGNOSIS — I10 Essential (primary) hypertension: Secondary | ICD-10-CM | POA: Diagnosis not present

## 2016-10-09 NOTE — Patient Instructions (Signed)
Medication Instructions:   NO CHANGE  Testing/Procedures:  Your physician has requested that you have an echocardiogram. Echocardiography is a painless test that uses sound waves to create images of your heart. It provides your doctor with information about the size and shape of your heart and how well your heart's chambers and valves are working. This procedure takes approximately one hour. There are no restrictions for this procedure.   Your physician has requested that you have a lexiscan myoview. For further information please visit [email or web address]. Please follow instruction sheet, as given.    Follow-Up:  Your physician wants you to follow-up in: [place] will receive a reminder letter in the mail two months in advance. If you don't receive a letter, please call our office to schedule the follow-up appointment.   If you need a refill on your cardiac medications before your next appointment, please call your pharmacy.

## 2016-10-10 ENCOUNTER — Telehealth (HOSPITAL_COMMUNITY): Payer: Self-pay

## 2016-10-10 ENCOUNTER — Other Ambulatory Visit: Payer: Self-pay

## 2016-10-10 ENCOUNTER — Ambulatory Visit: Payer: Self-pay

## 2016-10-10 NOTE — Telephone Encounter (Signed)
Encounter complete. 

## 2016-10-10 NOTE — Progress Notes (Signed)
[place]  [phone] [phone]  Clinic Follow-up  Note   Patient Care Team: Patient, No Pcp Per as PCP - General (General Practice) 10/14/2016   CHIEF COMPLAINTS:  Pancreatic cancer   Oncology History   Cancer Staging Pancreatic cancer [facility]) Staging form: Exocrine Pancreas, AJCC 8th Edition - Clinical: Stage IA (cT1c, cN0, cM0) - Signed by Truitt Merle, MD on 09/14/2016       Malignant neoplasm of prostate ([place])   06/27/2016 Initial Biopsy    Diagnosis 06/27/16 1. Urethra, biopsy, bulb KERATINIZED SQUAMOUS CELL METAPLASIA AND HYPERPLASIA 2. Ureter, biopsy, right orifice METASTATIC PROSTATIC ADENOCARCINOMA, GLEASON SCORE 5+5=10 INVOLVES 50% OF THE SPECIMEN 3. Urethra, biopsy, prostatic polyp PROSTATIC ADENOCARCINOMA, GLEASON SCORE 5+4=9 INVOLVES 30% OF THE RESECTED TISSUE 4. Bladder, biopsy, posterior METASTATIC PROSTATIC ADENOCARCINOMA, INVOLVING DETRUSOR MUSCLE BENIGN UROTHELIUM WITH POLYPOID CYSTITIS 5. Prostate, chips PROSTATIC ADENOCARCINOMA, GLEASON SCORE 5+5=10 INVOLVES 80% OF THE RESECTED TISSUE  ADDENDUM: Immunohistochemistry is attempted on the cell block and there is limited tumor present. The scant tumor present is negative with prostein and prostate specific antigen. Called to Dr. Burr Medico and Dr. Ardis Hughs on 09/17/16. (JDP:gt, 09/17/16)      07/25/2016 Initial Diagnosis    Malignant neoplasm of prostate ([place])     09/19/2016 Imaging    CT Chest IMPRESSION: 1. Several small solid pulmonary nodules scattered in both lungs, largest 6 mm, indeterminate but more likely benign given predominantly subpleural location of the nodules. Recommend initial follow-up chest CT in 3 months. 2. No thoracic adenopathy or other potential findings of metastatic disease in the chest. 3. Right upper lobe 1.1 cm ground-glass pulmonary nodule. Initial follow-up with CT at 6-12 months is recommended to confirm persistence. If persistent, repeat CT is  recommended every 2 years until 5 years of stability has been established. This recommendation follows the consensus statement: Guidelines for Management of Incidental Pulmonary Nodules Detected on CT Images: From the Fleischner Society 2017; Radiology 2017; 284:228-243. 4. Mild cardiomegaly. Three-vessel coronary atherosclerosis status post CABG. 5. Multinodular goiter with dominant 3.7 cm right thyroid lobe nodule, for which thyroid ultrasound correlation is warranted, which may be performed if clinically warranted. 6. Re- demonstration of diffuse pancreatic duct dilation due to known pancreatic malignancy.        Pancreatic cancer ([place])   07/08/2016 Imaging    NM Bone Scan Whole Body 07/08/16 IMPRESSION: There are no findings suspicious for metastatic disease to the skeleton.      07/28/2016 Imaging    MR Abd w/wo contrast 07/28/16 IMPRESSION: 1. There is a suspicious lesion involving the head of pancreas and obstructing the pancreatic duct is identified. Involvement of the portal vein and portal venous confluence is suspected. Findings worrisome for pancreatic adenocarcinoma. Further investigation with endoscopic ultrasound and tissue sampling advise. 2. Resolution of bilateral hydronephrosis. Bilateral kidney cysts noted. 3. Aortic atherosclerosis.      08/28/2016 Procedure    EUS by Dr. Ardis Hughs 08/28/16 IMPRESSION:  1.9cm by 1.5cm mass in the head of pancreas causing main pancreatic duct obstruction and directly abutting the portal vein (suggesting invasion). The mass is not obstructing the bile duct. Preliminary reading of FNA shows "very atypical cells."        08/29/2016 Initial Biopsy    Diagnosis 08/29/16 FINE NEEDLE ASPIRATION, ENDOSCOPIC, PANCREAS HEAD(SPECIMEN 1 OF 1 COLLECTED 08/28/16): POORLY DIFFERENTIATED CARCINOMA ASSOCIATED WITH ACUTE INFLAMMATION. SEE COMMENT. Comment: There are a few cells with features suggestive of adenocarcinoma.      09/12/2016  Initial Diagnosis    Pancreatic cancer ([facility])     09/19/2016 Imaging    CT Chest WO Contrast 09/19/16 IMPRESSION: 1. Several small solid pulmonary nodules scattered in both lungs, largest 6 mm, indeterminate but more likely benign given predominantly subpleural location of the nodules. Recommend initial follow-up chest CT in 3 months. 2. No thoracic adenopathy or other potential findings of metastatic disease in the chest. 3. Right upper lobe 1.1 cm ground-glass pulmonary nodule. Initial follow-up with CT at 6-12 months is recommended to confirm persistence. If persistent, repeat CT is recommended every 2 years until 5 years of stability has been established. This recommendation follows the consensus statement: Guidelines for Management of Incidental Pulmonary Nodules Detected on CT Images: From the Fleischner Society 2017; Radiology 2017; 284:228-243. 4. Mild cardiomegaly. Three-vessel coronary atherosclerosis status post CABG. 5. Multinodular goiter with dominant 3.7 cm right thyroid lobe nodule, for which thyroid ultrasound correlation is warranted, which may be performed if clinically warranted. 6. Re- demonstration of diffuse pancreatic duct dilation due to known pancreatic malignancy.      09/26/2016 -  Chemotherapy    neoadjuvant Gemcitabine and Abraxane on day 1, 8 every 3 weeks started 09/26/16, will hold Abraxane for first 1-2 doses, reduced gemcitabine dosed to 800 mg/m        HISTORY OF PRESENTING ILLNESS: 09/12/16 [name] 71 y.o. male is here because of pancreatic cancer. Approximately one year ago, pt was admitted for urinary retention. Dr Mena Goes met him on service in the hospital. Since then he has been followed by closely [facility] for ongoing BPH with urinary retention. He did have a foley catheter in place for 3-4 months ago until approximately one month ago when this was removed. Pt had a DRE in 06/2016 which showed some induration, he underwent TURP with  prostate biopsy following this on 06/27/2016. His pathology revealed Gleason 5+5 disease in all TUR chips with local invasion into the bladder detrusor muscle. He had a PSA performed on 07/11/16 which was 17.5. Pt recently had consult with radiation oncology and he has been taking injections since. He has not been tolerating any other treatments for his prostate cancer. He also had a CT A/p and NM Bone scan performed on 07/11/16. His bone scan was negative for any osseous metastatic abnormalities; however, his CT scan showed a single pathologically enlarged pelvic lymph node as well as dilation of the main pancreatic duct without discrete pancreatic mass. Subsequently he underwent MR A/p on 07/28/16 which showed a suspicious lesion involving the head of the pancreas and obstructed the pancreatic duct. Involvement of the portal vein and portal venous confluence was suspected, per radiology. Overall, pt is a poor historian.   He was subsequently referred by Dr Christella Hartigan to medical oncology for further management. He is not currently followed by a PCP. He denies abdominal pain, diarrhea, constipation, unexpected weight loss, loss of appetite, chest pain, shortness of breath, fatigue. He is otherwise asymptomatic and feeling at his baseline today. Of note, he states that he did not take his prescribed BP medications this morning, 09/12/16.   He stopped smoking ~13 years ago, and is only an occasional drinker. He is currently retired from [employer]. He is currently single and does have five children, two of which are his step-sons. He states that he lives in an apartment here in [place] with his two youngest sons, ages 74 and 11yo.   CURRENT THERAPY: neoadjuvant Gemcitabine and Abraxane on day 1, 8 every 21 days,  Gemcitabine reduced dose to 800 mg/m^2, Held Abraxane for first 2 doses started 09/26/16  INTERVAL HISTORY:  [name] is here for a follow up. He presents to the clinic today accompanied by his  family member, Larkin Ina.  He notes after his last cycle chemo that he had swelling in his lower right leg. This has now resolved. He denies issues with his appetite but previously made hisself eat. This week his appetite is normal. He tolerated chemo overall well so far.  Based on his lab his kidney enzymes are elevated. He notes to drinking a lot of water. It was encouraged to monitor this and continue drinking plenty of fluids.    MEDICAL HISTORY:  Past Medical History:  Diagnosis Date  . Acute MI, inferior wall ([place]) 1966  . Amputation finger    right first finger top portion age 35  . CHF (congestive heart failure) ([place])   . Coronary artery disease    severe 3 vessel   . Fall   . Foley catheter in place   . H/O hyperkalemia   . H/O thrombocytosis   . Hip fracture, left ([place])    11/2014  . History of blood transfusion   . History of metabolic acidosis   . History of tobacco abuse   . Hypertension   . Obstructive uropathy   . Pneumonia    childhood   . Prostate cancer ([place])   . S/P VSD repair    09/2009  . Trichomonas infection   . Urethral stricture due to infective diseases classified elsewhere   . Urinary tract infection     SURGICAL HISTORY: Past Surgical History:  Procedure Laterality Date  . CORONARY ARTERY BYPASS GRAFT     times 4  . CYSTOSCOPY W/ RETROGRADES N/A 04/03/2015   Procedure:  BLADDER BIOPSIES;  Surgeon: Festus Aloe, MD;  Location: [facility];  Service: Urology;  Laterality: N/A;  . CYSTOSCOPY W/ RETROGRADES Bilateral 06/27/2016   Procedure: CYSTOSCOPY WITH BILATERAL RETROGRADES;  Surgeon: Festus Aloe, MD;  Location: [facility];  Service: Urology;  Laterality: Bilateral;  . CYSTOSCOPY WITH BIOPSY N/A 06/27/2016   Procedure: CYSTOSCOPY WITH BLADDER BIOPSY URETHRAL BIOPSY;  Surgeon: Festus Aloe, MD;  Location: [facility];  Service: Urology;  Laterality: N/A;  . CYSTOSCOPY WITH URETHRAL DILATATION N/A 04/03/2015   Procedure: CYSTOSCOPY WITH BILATERAL  RETROGRADES;  Surgeon: Festus Aloe, MD;  Location: [facility];  Service: Urology;  Laterality: N/A;  . EUS N/A 08/28/2016   Procedure: UPPER ENDOSCOPIC ULTRASOUND (EUS) RADIAL;  Surgeon: Milus Banister, MD;  Location: [facility];  Service: Endoscopy;  Laterality: N/A;  . flexible cystoscopy    . repair of post infarction posterior ventricular septal defect    . TRANSURETHRAL RESECTION OF PROSTATE  06/27/2016   Procedure: TRANSURETHRAL RESECTION OF THE PROSTATE (TURP);  Surgeon: Festus Aloe, MD;  Location: [facility];  Service: Urology;;    SOCIAL HISTORY: Social History   Social History  . Marital status: Divorced    Spouse name: N/A  . Number of children: N/A  . Years of education: N/A   Occupational History  . Not on file.   Social History Main Topics  . Smoking status: Former Smoker    Packs/day: 1.50    Years: 45.00    Types: Cigarettes    Quit date: 01/13/2010  . Smokeless tobacco: Never Used  . Alcohol use No     Comment: occasional   . Drug use: No  . Sexual activity: Not on file  Other Topics Concern  . Not on file   Social History Narrative  . No narrative on file    FAMILY HISTORY: Family History  Problem Relation Age of Onset  . Cancer Paternal Aunt        unknown type cancer   . Cancer Paternal Uncle        unknown type cancer    ALLERGIES:  is allergic to chlorhexidine gluconate.  MEDICATIONS:  Current Outpatient Prescriptions  Medication Sig Dispense Refill  . amLODipine (NORVASC) 10 MG tablet take 1/2 tablet by mouth once daily 30 tablet 11  . carvedilol (COREG) 12.5 MG tablet Take 1 tablet (12.5 mg total) by mouth 2 (two) times daily with a meal. KEEP OV. 30 tablet 0  . ondansetron (ZOFRAN) 8 MG tablet Take 1 tablet (8 mg total) by mouth 2 (two) times daily as needed (Nausea or vomiting). 30 tablet 1  . prochlorperazine (COMPAZINE) 10 MG tablet Take 1 tablet (10 mg total) by mouth every 6 (six) hours as needed (Nausea or vomiting). 30  tablet 1  . rosuvastatin (CRESTOR) 40 MG tablet Take 1 tablet (40 mg total) by mouth daily. KEEP OV. 30 tablet 0   No current facility-administered medications for this visit.    Facility-Administered Medications Ordered in Other Visits  Medication Dose Route Frequency Provider Last Rate Last Dose  . ceFAZolin (ANCEF) IVPB 2g/100 mL premix  2 g Intravenous 30 min Pre-Op Festus Aloe, MD        REVIEW OF SYSTEMS:  Constitutional: Denies fevers, chills or abnormal night sweats Eyes: Denies blurriness of vision, double vision or watery eyes Ears, nose, mouth, throat, and face: Denies mucositis or sore throat Respiratory: Denies cough, dyspnea or wheezes Cardiovascular: Denies palpitation, chest discomfort or lower extremity swelling Gastrointestinal:  Denies nausea, heartburn or change in bowel habits Skin: Denies abnormal skin rashes Lymphatics: Denies new lymphadenopathy or easy bruising Neurological:Denies numbness, tingling or new weaknesses Behavioral/Psych: Mood is stable, no new changes  All other systems were reviewed with the patient and are negative.  PHYSICAL EXAMINATION:  ECOG PERFORMANCE STATUS: 1  Vitals:   10/14/16 0844  BP: 119/80  Pulse: 66  Resp: 20  Temp: 98.2 F (36.8 C)  SpO2: 100%   Filed Weights   10/14/16 0844  Weight: 198 lb 9.6 oz (90.1 kg)     GENERAL:alert, no distress and comfortable SKIN: skin color, texture, turgor are normal, no rashes or significant lesions EYES: normal, conjunctiva are pink and non-injected, sclera clear OROPHARYNX:no exudate, no erythema and lips, buccal mucosa, and tongue normal  NECK: supple, thyroid normal size, non-tender, without nodularity LYMPH:  no palpable lymphadenopathy in the cervical, axillary or inguinal LUNGS: clear to auscultation and percussion with normal breathing effort HEART: regular rate & rhythm and no murmurs and no lower extremity edema ABDOMEN:abdomen soft, non-tender and normal bowel  sounds Musculoskeletal:no cyanosis of digits and no clubbing  PSYCH: alert & oriented x 3 with fluent speech NEURO: no focal motor/sensory deficits   LABORATORY DATA:  I have reviewed the data as listed CBC Latest Ref Rng & Units 10/14/2016 10/03/2016 09/26/2016  WBC 4.0 - 10.3 10e3/uL 4.0 4.1 6.1  Hemoglobin 13.0 - 17.1 g/dL 9.9(L) 10.6(L) 11.6(L)  Hematocrit 38.4 - 49.9 % 30.3(L) 32.2(L) 35.8(L)  Platelets 140 - 400 10e3/uL 318 198 332   CMP Latest Ref Rng & Units 10/14/2016 10/03/2016 09/26/2016  Glucose 70 - 140 mg/dl 101 146(H) 105  BUN 7.0 - 26.0 mg/dL 42.0(H) 66.5(H) 51.3(H)  Creatinine 0.7 - 1.3 mg/dL 2.6(H) 3.4(HH) 2.7(H)  Sodium 136 - 145 mEq/L 137 134(L) 139  Potassium 3.5 - 5.1 mEq/L 4.1 3.8 4.2  Chloride 101 - 111 mmol/L - - -  CO2 22 - 29 mEq/L 14(L) 12(L) 15(L)  Calcium 8.4 - 10.4 mg/dL 10.0 10.1 10.0  Total Protein 6.4 - 8.3 g/dL 8.2 8.5(H) 8.0  Total Bilirubin 0.20 - 1.20 mg/dL <0.22 0.37 0.22  Alkaline Phos 40 - 150 U/L 90 100 102  AST 5 - 34 U/L '16 19 13  '$ ALT 0 - 55 U/L '12 16 7    '$ CA 19-9 09/12/16: 21 10/14/16: PENDING   PSA:  11/23/2006: 0.72 01/21/16: 0.73 07/11/16: 17.5  PATHOLOGY REPORTS:  Diagnosis 08/29/16 FINE NEEDLE ASPIRATION, ENDOSCOPIC, PANCREAS HEAD(SPECIMEN 1 OF 1 COLLECTED 08/28/16): POORLY DIFFERENTIATED CARCINOMA ASSOCIATED WITH ACUTE INFLAMMATION. SEE COMMENT. Comment: There are a few cells with features suggestive of adenocarcinoma.  Diagnosis 06/27/16 1. Urethra, biopsy, bulb KERATINIZED SQUAMOUS CELL METAPLASIA AND HYPERPLASIA 2. Ureter, biopsy, right orifice METASTATIC PROSTATIC ADENOCARCINOMA, GLEASON SCORE 5+5=10 INVOLVES 50% OF THE SPECIMEN 3. Urethra, biopsy, prostatic polyp PROSTATIC ADENOCARCINOMA, GLEASON SCORE 5+4=9 INVOLVES 30% OF THE RESECTED TISSUE 4. Bladder, biopsy, posterior METASTATIC PROSTATIC ADENOCARCINOMA, INVOLVING DETRUSOR MUSCLE BENIGN UROTHELIUM WITH POLYPOID CYSTITIS 5. Prostate, chips PROSTATIC  ADENOCARCINOMA, GLEASON SCORE 5+5=10 INVOLVES 80% OF THE RESECTED TISSUE Microscopic Comment 5. The neoplasm stains positive for prostein, ck8/18, negative for p63 and ck903, supporting the diagnosis of adenocarcinoma of the prostate.    UPPER ENDOSCOPIC [place] 08/28/16 IMPRESSION:  1.9cm by 1.5cm mass in the head of pancreas causing main pancreatic duct obstruction and directly abutting the portal vein (suggesting invasion). The mass is not obstructing the bile duct. Preliminary reading of FNA shows "very atypical cells."  RADIOGRAPHIC STUDIES: I have personally reviewed the radiological images as listed and agreed with the findings in the report. Ct Chest Wo Contrast  Result Date: 09/19/2016 CLINICAL DATA:  Chest staging. Recent diagnosis of prostate cancer 1 month prior and pancreatic cancer 2 months prior. EXAM: CT CHEST WITHOUT CONTRAST TECHNIQUE: Multidetector CT imaging of the chest was performed following the standard protocol without IV contrast. COMPARISON:  07/08/2016 CT abdomen/ pelvis. 03/09/2015 chest radiograph. FINDINGS: Cardiovascular: Mild cardiomegaly. No significant pericardial fluid/thickening. Left anterior descending, left circumflex and right coronary atherosclerosis status post CABG. Atherosclerotic nonaneurysmal thoracic aorta. Normal caliber pulmonary arteries. Mediastinum/Nodes: Multiple hypodense bilateral thyroid nodules, largest 3.7 cm in the lower right thyroid lobe. Unremarkable esophagus. No pathologically enlarged axillary, mediastinal or gross hilar lymph nodes, noting limited sensitivity for the detection of hilar adenopathy on this noncontrast study. Lungs/Pleura: No pneumothorax. No pleural effusion. Mild centrilobular and paraseptal emphysema with diffuse bronchial wall thickening. No acute consolidative airspace disease or lung masses. Ground-glass peripheral right upper lobe 1.1 x 1.0 cm pulmonary nodule (series 5/ image 63). Several (at least 8) small solid  pulmonary nodules scattered in both lungs, predominantly subpleural, largest 6 mm in the right middle lobe (series 5/ image 84). Upper abdomen: Re- demonstration of diffuse pancreatic duct dilation in the visualized pancreatic body and tail. Musculoskeletal: No aggressive appearing focal osseous lesions. Mild to moderate gynecomastia, asymmetric to the left. IMPRESSION: 1. Several small solid pulmonary nodules scattered in both lungs, largest 6 mm, indeterminate but more likely benign given predominantly subpleural location of the nodules. Recommend initial follow-up chest CT in 3 months. 2. No thoracic adenopathy or other potential findings of metastatic disease in the chest. 3. Right upper lobe 1.1 cm ground-glass pulmonary  nodule. Initial follow-up with CT at 6-12 months is recommended to confirm persistence. If persistent, repeat CT is recommended every 2 years until 5 years of stability has been established. This recommendation follows the consensus statement: Guidelines for Management of Incidental Pulmonary Nodules Detected on CT Images: From the Fleischner Society 2017; Radiology 2017; 284:228-243. 4. Mild cardiomegaly. Three-vessel coronary atherosclerosis status post CABG. 5. Multinodular goiter with dominant 3.7 cm right thyroid lobe nodule, for which thyroid ultrasound correlation is warranted, which may be performed if clinically warranted. 6. Re- demonstration of diffuse pancreatic duct dilation due to known pancreatic malignancy. Aortic Atherosclerosis (ICD10-I70.0) and Emphysema (ICD10-J43.9). Electronically Signed   By: Delbert Phenix M.D.   On: 09/19/2016 12:49    CT Chest WO Contrast 09/19/16 IMPRESSION: 1. Several small solid pulmonary nodules scattered in both lungs, largest 6 mm, indeterminate but more likely benign given predominantly subpleural location of the nodules. Recommend initial follow-up chest CT in 3 months. 2. No thoracic adenopathy or other potential findings of  metastatic disease in the chest. 3. Right upper lobe 1.1 cm ground-glass pulmonary nodule. Initial follow-up with CT at 6-12 months is recommended to confirm persistence. If persistent, repeat CT is recommended every 2 years until 5 years of stability has been established. This recommendation follows the consensus statement: Guidelines for Management of Incidental Pulmonary Nodules Detected on CT Images: From the Fleischner Society 2017; Radiology 2017; 284:228-243. 4. Mild cardiomegaly. Three-vessel coronary atherosclerosis status post CABG. 5. Multinodular goiter with dominant 3.7 cm right thyroid lobe nodule, for which thyroid ultrasound correlation is warranted, which may be performed if clinically warranted. 6. Re- demonstration of diffuse pancreatic duct dilation due to known pancreatic malignancy.  MR Abd w/wo contrast 07/28/16 IMPRESSION: 1. There is a suspicious lesion involving the head of pancreas and obstructing the pancreatic duct is identified. Involvement of the portal vein and portal venous confluence is suspected. Findings worrisome for pancreatic adenocarcinoma. Further investigation with endoscopic ultrasound and tissue sampling advise. 2. Resolution of bilateral hydronephrosis. Bilateral kidney cysts noted. 3. Aortic atherosclerosis.  NM Bone Scan Whole Body 07/08/16 IMPRESSION: There are no findings suspicious for metastatic disease to the skeleton.  ASSESSMENT: 71 y.o. African-American male, with past medical history of CAD, and recently diagnosed prostate cancer, gleason score 10, presented with incidental imaging finding of a pancreatic head mass.  1. Carcinoma of the head of the pancreas, cT1cN0M0,stage IA -I previously reviewed his CT and MRI scan findings, which showed a mass in the pancreatic head, with probable portal vein involvement. The mass measures about 1.9 cm by endoscopy ultrasound, with ultrasound evidence of portal vein invasion. -I  previously reviewed his surgical path findings with pt in great details. He did have UEUS on 08/28/16 by Dr Christella Hartigan, biopsy was performed during this which revealed a poorly differentiated carcinoma of the head of the pancreas. Due to his recent prostate cancer, I requested additional IHC, which was negative so prostates metastasis was ruled out  -I informed him that due to the nature and location of his tumor that he would need Whipple surgery to resect it.  -I have consulted Dr. Donell Beers and we discussed his case in our GI tumor board. According to the ultrasound and MRI, his pancreatic mass appears to be borderline resectable, and will require neoadjuvant chemotherapy and/or radiation. --He can receive treatment via peripheral veins for now  -He has tolerated Gemcitabine well with first 2 cycles. Will add Abraxane starting today (10/14/16). I discussed side effects he may experience with  this new drug. He agrees to proceed  -After 2-3 more cycles we will repeat CT scan.  -I discussed he has borderline resectable tumor and I encouraged him to consider surgery if his tumor became resectable after neoadjuvant therapy. He will think about this. -Reviewed, he has developed a mild neutropenia, ANC 1.2, stable renal function, adequate for treatment. No need for dose adjustment of Abraxane based on his renal function -He declined flu shot today.  -F/u in 2 weeks.    2. CHF, HTN, CAD s/p CABGx4 -He is currently followed by Dr Stanford Breed of Cardiology for ongoing management of his cardiac issues.  -He is currently on Amlodipine, Crestor, and Crestor, but has some issues with compliance.  -his last echo showed EF 45% in 2014, will repeat his echo before chemo  -He will contact his PCP for refills   3. Newly diagnosed prostate cancer, gleason score 10  -He presented with urinary obstruction, seen by urologist Dr. Junious Silk, and prostate biopsy showed prostate cancer, Gleason score 10, which is very high  risk -his staging CT abdomen and pelvis and a bone scan showed no other evidence of metastasis, except the pancreatic mass -he has started ADT, plan to have prostate seed placement by Dr. Tammi Klippel, which has been held since his pancreatic cancer diagnosis  -I discussed his case with Dr. Tammi Klippel, he agreed treatment of pancreatic cancer is the priority over prostate cancer treatment will coordinate his care with Dr. Tammi Klippel   4. CKD stage III  -f/u PCP, avoid IV contrast and other nephrotoxic  5. Thyroid nodule -likely benign growth -will monitor   6. Lung nodules  -discussed CT results with patient in detail -he is asymptomatic -I discussed this is likely related to smoking history and likely benign -will monitor; will likely repeat scan in 3 months   PLAN:  -Labs adequate to continue chemo, will do gemcitabine and Abraxane today, off chemo next week  -Lab, f/u with lacie or me and chemo gemcitabine and abraxane in 2 and 3 weeks  All questions were answered. The patient knows to call the clinic with any problems, questions or concerns. I spent 25 minutes counseling the patient face to face. The total time spent in the appointment was 30 minutes and more than 50% was on counseling.  This document serves as a record of services personally performed by Truitt Merle, MD. It was created on her behalf by Joslyn Devon, a trained medical scribe. The creation of this record is based on the scribe's personal observations and the provider's statements to them. This document has been checked and approved by the attending provider.    Truitt Merle, MD 10/14/2016

## 2016-10-13 ENCOUNTER — Encounter: Payer: Self-pay | Admitting: General Practice

## 2016-10-13 ENCOUNTER — Other Ambulatory Visit: Payer: Self-pay | Admitting: Physician Assistant

## 2016-10-13 ENCOUNTER — Other Ambulatory Visit: Payer: Self-pay | Admitting: Cardiology

## 2016-10-13 ENCOUNTER — Encounter: Payer: Self-pay | Admitting: *Deleted

## 2016-10-13 ENCOUNTER — Encounter: Payer: Self-pay | Admitting: Hematology

## 2016-10-13 DIAGNOSIS — Z01818 Encounter for other preprocedural examination: Secondary | ICD-10-CM

## 2016-10-13 DIAGNOSIS — I255 Ischemic cardiomyopathy: Secondary | ICD-10-CM

## 2016-10-13 NOTE — Progress Notes (Signed)
[facility] Spiritual Care Note  Returned call from [name], who requested transportation assistance for his three appointments this week.  Referred to [name]/LCSW to explore eligibility/need and to f/u with pt directly.   [place], [place], [facility] Pager [phone] Voicemail [phone]

## 2016-10-13 NOTE — Progress Notes (Signed)
Received referral from [facility] regarding patient needing transportation assistance in forms of gas cards.   Approved patient for one-time $200 grant for prostate. Vernie Shanks will call patient and ask hm to bring proof of income per my request to see if he qualifies for one-time $400 [facility] as well. Left a gas card up front with Mrs. Wilma for him to receive tomorrow. I will be in class off site tomorrow,therefore, Vernie Shanks will make copy of income and leave for me.

## 2016-10-13 NOTE — Progress Notes (Signed)
[place] Work  Holiday representative received referral from [employer] for transportation needs.  CSW contacted patient at home to offer support and assess for needs.  Patient stated his son normally provides transportation to his appointments, but providing gas money was becoming a concern.  CSW informed patient of the [place] and additional resources at [employer].  CSW spoke with financial advocate who will arrange for patient to have a gas card tomorrow.  CSW also encouraged patient to bring his proof of income to apply for the grant.  Patient was appreciative of CSW contact and information.  CSW will follow up with patient during treatment tomorrow (10/14/16).    Johnnye Lana, MSW, LCSW, OSW-C Clinical Social Worker [facility] [phone]

## 2016-10-14 ENCOUNTER — Encounter: Payer: Self-pay | Admitting: Hematology

## 2016-10-14 ENCOUNTER — Other Ambulatory Visit (HOSPITAL_BASED_OUTPATIENT_CLINIC_OR_DEPARTMENT_OTHER): Payer: Medicare Other

## 2016-10-14 ENCOUNTER — Ambulatory Visit (HOSPITAL_BASED_OUTPATIENT_CLINIC_OR_DEPARTMENT_OTHER): Payer: Medicare Other

## 2016-10-14 ENCOUNTER — Ambulatory Visit (HOSPITAL_BASED_OUTPATIENT_CLINIC_OR_DEPARTMENT_OTHER): Payer: Medicare Other | Admitting: Hematology

## 2016-10-14 VITALS — BP 119/80 | HR 66 | Temp 98.2°F | Resp 20 | Ht 66.0 in | Wt 198.6 lb

## 2016-10-14 DIAGNOSIS — Z5111 Encounter for antineoplastic chemotherapy: Secondary | ICD-10-CM

## 2016-10-14 DIAGNOSIS — C250 Malignant neoplasm of head of pancreas: Secondary | ICD-10-CM | POA: Diagnosis not present

## 2016-10-14 DIAGNOSIS — C61 Malignant neoplasm of prostate: Secondary | ICD-10-CM | POA: Diagnosis not present

## 2016-10-14 DIAGNOSIS — N183 Chronic kidney disease, stage 3 (moderate): Secondary | ICD-10-CM | POA: Diagnosis not present

## 2016-10-14 DIAGNOSIS — I255 Ischemic cardiomyopathy: Secondary | ICD-10-CM | POA: Diagnosis not present

## 2016-10-14 LAB — CBC WITH DIFFERENTIAL/PLATELET
BASO%: 0.3 % (ref 0.0–2.0)
Basophils Absolute: 0 10*3/uL (ref 0.0–0.1)
EOS%: 0.9 % (ref 0.0–7.0)
Eosinophils Absolute: 0 10*3/uL (ref 0.0–0.5)
HCT: 30.3 % — ABNORMAL LOW (ref 38.4–49.9)
HGB: 9.9 g/dL — ABNORMAL LOW (ref 13.0–17.1)
LYMPH%: 58.8 % — AB (ref 14.0–49.0)
MCH: 27.4 pg (ref 27.2–33.4)
MCHC: 32.6 g/dL (ref 32.0–36.0)
MCV: 84.1 fL (ref 79.3–98.0)
MONO#: 0.4 10*3/uL (ref 0.1–0.9)
MONO%: 10.2 % (ref 0.0–14.0)
NEUT%: 29.8 % — ABNORMAL LOW (ref 39.0–75.0)
NEUTROS ABS: 1.2 10*3/uL — AB (ref 1.5–6.5)
Platelets: 318 10*3/uL (ref 140–400)
RBC: 3.61 10*6/uL — AB (ref 4.20–5.82)
RDW: 15.2 % — ABNORMAL HIGH (ref 11.0–14.6)
WBC: 4 10*3/uL (ref 4.0–10.3)
lymph#: 2.3 10*3/uL (ref 0.9–3.3)

## 2016-10-14 LAB — COMPREHENSIVE METABOLIC PANEL
ALT: 12 U/L (ref 0–55)
AST: 16 U/L (ref 5–34)
Albumin: 2.7 g/dL — ABNORMAL LOW (ref 3.5–5.0)
Alkaline Phosphatase: 90 U/L (ref 40–150)
Anion Gap: 9 mEq/L (ref 3–11)
BUN: 42 mg/dL — ABNORMAL HIGH (ref 7.0–26.0)
CHLORIDE: 114 meq/L — AB (ref 98–109)
CO2: 14 meq/L — AB (ref 22–29)
CREATININE: 2.6 mg/dL — AB (ref 0.7–1.3)
Calcium: 10 mg/dL (ref 8.4–10.4)
EGFR: 27 mL/min/{1.73_m2} — AB (ref >90)
GLUCOSE: 101 mg/dL (ref 70–140)
Potassium: 4.1 mEq/L (ref 3.5–5.1)
SODIUM: 137 meq/L (ref 136–145)
TOTAL PROTEIN: 8.2 g/dL (ref 6.4–8.3)

## 2016-10-14 MED ORDER — SODIUM CHLORIDE 0.9 % IV SOLN
Freq: Once | INTRAVENOUS | Status: AC
  Administered 2016-10-14: 10:00:00 via INTRAVENOUS

## 2016-10-14 MED ORDER — SODIUM CHLORIDE 0.9 % IV SOLN
800.0000 mg/m2 | Freq: Once | INTRAVENOUS | Status: AC
  Administered 2016-10-14: 1672 mg via INTRAVENOUS
  Filled 2016-10-14: qty 43.97

## 2016-10-14 MED ORDER — PACLITAXEL PROTEIN-BOUND CHEMO INJECTION 100 MG
125.0000 mg/m2 | Freq: Once | INTRAVENOUS | Status: AC
  Administered 2016-10-14: 250 mg via INTRAVENOUS
  Filled 2016-10-14: qty 50

## 2016-10-14 MED ORDER — PROCHLORPERAZINE MALEATE 10 MG PO TABS
ORAL_TABLET | ORAL | Status: AC
  Filled 2016-10-14: qty 1

## 2016-10-14 MED ORDER — PROCHLORPERAZINE MALEATE 10 MG PO TABS
10.0000 mg | ORAL_TABLET | Freq: Once | ORAL | Status: AC
  Administered 2016-10-14: 10 mg via ORAL

## 2016-10-14 NOTE — Progress Notes (Signed)
Pt with ANC of 1.2 and creatinine of 2.6 okay for treatment today per Dr. Burr Medico.

## 2016-10-14 NOTE — Patient Instructions (Addendum)
[facility] Discharge Instructions for Patients Receiving Chemotherapy  Today you received the following chemotherapy agents Abraxane and Gemzar  To help prevent nausea and vomiting after your treatment, we encourage you to take your nausea medication as directed If you develop nausea and vomiting that is not controlled by your nausea medication, call the clinic.   BELOW ARE SYMPTOMS THAT SHOULD BE REPORTED IMMEDIATELY:  *FEVER GREATER THAN 100.5 F  *CHILLS WITH OR WITHOUT FEVER  NAUSEA AND VOMITING THAT IS NOT CONTROLLED WITH YOUR NAUSEA MEDICATION  *UNUSUAL SHORTNESS OF BREATH  *UNUSUAL BRUISING OR BLEEDING  TENDERNESS IN MOUTH AND THROAT WITH OR WITHOUT PRESENCE OF ULCERS  *URINARY PROBLEMS  *BOWEL PROBLEMS  UNUSUAL RASH Items with * indicate a potential emergency and should be followed up as soon as possible.  Feel free to call the clinic should you have any questions or concerns. The clinic phone number is [phone].  Please show the [place] at check-in to the Emergency Department and triage nurse.   Nanoparticle Albumin-Bound Paclitaxel injection What is this medicine? NANOPARTICLE ALBUMIN-BOUND PACLITAXEL (Na no PAHR ti kuhl al BYOO muhn-bound PAK li TAX el) is a chemotherapy drug. It targets fast dividing cells, like cancer cells, and causes these cells to die. This medicine is used to treat advanced breast cancer and advanced lung cancer. This medicine may be used for other purposes; ask your health care provider or pharmacist if you have questions. COMMON BRAND NAME(S): Abraxane What should I tell my health care provider before I take this medicine? They need to know if you have any of these conditions: -kidney disease -liver disease -low blood counts, like low platelets, red blood cells, or white blood cells -recent or ongoing radiation therapy -an unusual or allergic reaction to paclitaxel, albumin, other chemotherapy, other  medicines, foods, dyes, or preservatives -pregnant or trying to get pregnant -breast-feeding How should I use this medicine? This drug is given as an infusion into a vein. It is administered in a hospital or clinic by a specially trained health care professional. Talk to your pediatrician regarding the use of this medicine in children. Special care may be needed. Overdosage: If you think you have taken too much of this medicine contact a poison control center or emergency room at once. NOTE: This medicine is only for you. Do not share this medicine with others. What if I miss a dose? It is important not to miss your dose. Call your doctor or health care professional if you are unable to keep an appointment. What may interact with this medicine? -cyclosporine -diazepam -ketoconazole -medicines to increase blood counts like filgrastim, pegfilgrastim, sargramostim -other chemotherapy drugs like cisplatin, doxorubicin, epirubicin, etoposide, teniposide, vincristine -quinidine -testosterone -vaccines -verapamil Talk to your doctor or health care professional before taking any of these medicines: -acetaminophen -aspirin -ibuprofen -ketoprofen -naproxen This list may not describe all possible interactions. Give your health care provider a list of all the medicines, herbs, non-prescription drugs, or dietary supplements you use. Also tell them if you smoke, drink alcohol, or use illegal drugs. Some items may interact with your medicine. What should I watch for while using this medicine? Your condition will be monitored carefully while you are receiving this medicine. You will need important blood work done while you are taking this medicine. This medicine can cause serious allergic reactions. If you experience allergic reactions like skin rash, itching or hives, swelling of the face, lips, or tongue, tell your doctor or health care  professional right away. In some cases, you may be given  additional medicines to help with side effects. Follow all directions for their use. This drug may make you feel generally unwell. This is not uncommon, as chemotherapy can affect healthy cells as well as cancer cells. Report any side effects. Continue your course of treatment even though you feel ill unless your doctor tells you to stop. Call your doctor or health care professional for advice if you get a fever, chills or sore throat, or other symptoms of a cold or flu. Do not treat yourself. This drug decreases your body's ability to fight infections. Try to avoid being around people who are sick. This medicine may increase your risk to bruise or bleed. Call your doctor or health care professional if you notice any unusual bleeding. Be careful brushing and flossing your teeth or using a toothpick because you may get an infection or bleed more easily. If you have any dental work done, tell your dentist you are receiving this medicine. Avoid taking products that contain aspirin, acetaminophen, ibuprofen, naproxen, or ketoprofen unless instructed by your doctor. These medicines may hide a fever. Do not become pregnant while taking this medicine. Women should inform their doctor if they wish to become pregnant or think they might be pregnant. There is a potential for serious side effects to an unborn child. Talk to your health care professional or pharmacist for more information. Do not breast-feed an infant while taking this medicine. Men are advised not to father a child while receiving this medicine. What side effects may I notice from receiving this medicine? Side effects that you should report to your doctor or health care professional as soon as possible: -allergic reactions like skin rash, itching or hives, swelling of the face, lips, or tongue -low blood counts - This drug may decrease the number of white blood cells, red blood cells and platelets. You may be at increased risk for infections and  bleeding. -signs of infection - fever or chills, cough, sore throat, pain or difficulty passing urine -signs of decreased platelets or bleeding - bruising, pinpoint red spots on the skin, black, tarry stools, nosebleeds -signs of decreased red blood cells - unusually weak or tired, fainting spells, lightheadedness -breathing problems -changes in vision -chest pain -high or low blood pressure -mouth sores -nausea and vomiting -pain, swelling, redness or irritation at the injection site -pain, tingling, numbness in the hands or feet -slow or irregular heartbeat -swelling of the ankle, feet, hands Side effects that usually do not require medical attention (report to your doctor or health care professional if they continue or are bothersome): -aches, pains -changes in the color of fingernails -diarrhea -hair loss -loss of appetite This list may not describe all possible side effects. Call your doctor for medical advice about side effects. You may report side effects to FDA at 1-800-FDA-1088. Where should I keep my medicine? This drug is given in a hospital or clinic and will not be stored at home. NOTE: This sheet is a summary. It may not cover all possible information. If you have questions about this medicine, talk to your doctor, pharmacist, or health care provider.  2018 Elsevier/Gold Standard (2014-11-01 10:05:20)

## 2016-10-14 NOTE — Progress Notes (Signed)
Pt is approved for the $400 [facility] grant.  °

## 2016-10-15 ENCOUNTER — Ambulatory Visit (HOSPITAL_COMMUNITY)
Admission: RE | Admit: 2016-10-15 | Payer: Medicare Other | Source: Ambulatory Visit | Attending: Cardiology | Admitting: Cardiology

## 2016-10-15 LAB — CANCER ANTIGEN 19-9: CAN 19-9: 17 U/mL (ref 0–35)

## 2016-10-17 ENCOUNTER — Other Ambulatory Visit (HOSPITAL_COMMUNITY): Payer: Self-pay

## 2016-10-20 ENCOUNTER — Telehealth: Payer: Self-pay | Admitting: *Deleted

## 2016-10-20 NOTE — Telephone Encounter (Signed)
Pt called requesting a call back from nurse.  Attempted to call pt with no answer.  Left message on voice mail asking pt to call nurse back. Pt's   Phone     [phone].

## 2016-10-21 ENCOUNTER — Ambulatory Visit (HOSPITAL_COMMUNITY)
Admission: RE | Admit: 2016-10-21 | Payer: Medicare Other | Source: Ambulatory Visit | Attending: Cardiology | Admitting: Cardiology

## 2016-10-23 ENCOUNTER — Telehealth: Payer: Self-pay

## 2016-10-23 ENCOUNTER — Telehealth: Payer: Self-pay | Admitting: *Deleted

## 2016-10-23 NOTE — Telephone Encounter (Signed)
Schedule appointment for Friday. Per 10/11 sch message

## 2016-10-23 NOTE — Telephone Encounter (Signed)
Spoke with pt and son.  Instructed pt to come in for lab at 0930 am, [facility] visit at  10 am on Friday  10/24/16.  Both pt and son voiced understanding.  Schedule message sent.

## 2016-10-23 NOTE — Telephone Encounter (Signed)
Pt called requesting a call back from nurse.  Spoke with pt and was informed that since after receiving chemo on 10/14/16, pt has been feeling weak, not able to walk, has been feeling nauseated.   Stated took antiemetics with some relief; bowel and bladder function fine.  Denied pain.   Stated for past 2 days, pt was able to walk fine, able to eat small meals and kept foods down.  Stated no nausea nor vomiting today.   Able to drink water fine. Pt stated he could not tolerate BP meds - makes pt feeling dizzy.  Instructed pt to notify his cardiologist Dr. Stanford Breed about BP meds problem. Pt will be contacted with further instructions from Dr. Burr Medico. Pt's    Phone   [phone].

## 2016-10-24 ENCOUNTER — Encounter: Payer: Self-pay | Admitting: Medical

## 2016-10-24 ENCOUNTER — Other Ambulatory Visit: Payer: Self-pay

## 2016-10-28 ENCOUNTER — Other Ambulatory Visit: Payer: Self-pay

## 2016-10-28 ENCOUNTER — Telehealth: Payer: Self-pay | Admitting: Nurse Practitioner

## 2016-10-28 ENCOUNTER — Ambulatory Visit: Payer: Self-pay | Admitting: Nurse Practitioner

## 2016-10-28 ENCOUNTER — Ambulatory Visit: Payer: Self-pay

## 2016-10-28 NOTE — Telephone Encounter (Signed)
I called the patient around 10 am this morning after missed appointments. His son voiced that he would be able to get the patient here soon for lab, f/u, and chemo infusion. I checked with infusion charge RN and she informed me that it would be oK for patient to be late, they could accommodate.

## 2016-11-04 ENCOUNTER — Ambulatory Visit (HOSPITAL_BASED_OUTPATIENT_CLINIC_OR_DEPARTMENT_OTHER): Payer: Medicare Other | Admitting: Nurse Practitioner

## 2016-11-04 ENCOUNTER — Other Ambulatory Visit (HOSPITAL_BASED_OUTPATIENT_CLINIC_OR_DEPARTMENT_OTHER): Payer: Medicare Other

## 2016-11-04 ENCOUNTER — Ambulatory Visit: Payer: Medicare Other

## 2016-11-04 ENCOUNTER — Telehealth: Payer: Self-pay

## 2016-11-04 ENCOUNTER — Encounter: Payer: Self-pay | Admitting: Nurse Practitioner

## 2016-11-04 VITALS — BP 171/94 | HR 64 | Temp 98.2°F | Resp 18 | Ht 66.0 in | Wt 200.2 lb

## 2016-11-04 DIAGNOSIS — N183 Chronic kidney disease, stage 3 (moderate): Secondary | ICD-10-CM

## 2016-11-04 DIAGNOSIS — C250 Malignant neoplasm of head of pancreas: Secondary | ICD-10-CM

## 2016-11-04 DIAGNOSIS — C61 Malignant neoplasm of prostate: Secondary | ICD-10-CM

## 2016-11-04 LAB — COMPREHENSIVE METABOLIC PANEL
ALBUMIN: 2.6 g/dL — AB (ref 3.5–5.0)
ALK PHOS: 106 U/L (ref 40–150)
ALT: 8 U/L (ref 0–55)
AST: 16 U/L (ref 5–34)
Anion Gap: 8 mEq/L (ref 3–11)
BUN: 28.6 mg/dL — AB (ref 7.0–26.0)
CO2: 20 mEq/L — ABNORMAL LOW (ref 22–29)
Calcium: 9.6 mg/dL (ref 8.4–10.4)
Chloride: 110 mEq/L — ABNORMAL HIGH (ref 98–109)
Creatinine: 2.5 mg/dL — ABNORMAL HIGH (ref 0.7–1.3)
EGFR: 29 mL/min/{1.73_m2} — AB (ref >60)
GLUCOSE: 97 mg/dL (ref 70–140)
Potassium: 4.3 mEq/L (ref 3.5–5.1)
SODIUM: 139 meq/L (ref 136–145)
TOTAL PROTEIN: 7.7 g/dL (ref 6.4–8.3)

## 2016-11-04 LAB — CBC WITH DIFFERENTIAL/PLATELET
BASO%: 1 % (ref 0.0–2.0)
Basophils Absolute: 0.1 10*3/uL (ref 0.0–0.1)
EOS%: 3 % (ref 0.0–7.0)
Eosinophils Absolute: 0.2 10*3/uL (ref 0.0–0.5)
HEMATOCRIT: 28.1 % — AB (ref 38.4–49.9)
HEMOGLOBIN: 9.1 g/dL — AB (ref 13.0–17.1)
LYMPH#: 2.7 10*3/uL (ref 0.9–3.3)
LYMPH%: 33 % (ref 14.0–49.0)
MCH: 27.8 pg (ref 27.2–33.4)
MCHC: 32.5 g/dL (ref 32.0–36.0)
MCV: 85.7 fL (ref 79.3–98.0)
MONO#: 1.4 10*3/uL — AB (ref 0.1–0.9)
MONO%: 17.2 % — ABNORMAL HIGH (ref 0.0–14.0)
NEUT%: 45.8 % (ref 39.0–75.0)
NEUTROS ABS: 3.7 10*3/uL (ref 1.5–6.5)
PLATELETS: 699 10*3/uL — AB (ref 140–400)
RBC: 3.28 10*6/uL — ABNORMAL LOW (ref 4.20–5.82)
RDW: 17.5 % — AB (ref 11.0–14.6)
WBC: 8.1 10*3/uL (ref 4.0–10.3)

## 2016-11-04 NOTE — Progress Notes (Addendum)
[place]  [phone]) [phone] [phone]) [phone]  Clinic Follow up Note   Patient Care Team: Patient, No Pcp Per as PCP - General (General Practice) 11/04/2016  SUMMARY OF ONCOLOGIC HISTORY: Oncology History   Cancer Staging Pancreatic cancer [facility]) Staging form: Exocrine Pancreas, AJCC 8th Edition - Clinical: Stage IA (cT1c, cN0, cM0) - Signed by Truitt Merle, MD on 09/14/2016       Malignant neoplasm of prostate ([place])   06/27/2016 Initial Biopsy    Diagnosis 06/27/16 1. Urethra, biopsy, bulb KERATINIZED SQUAMOUS CELL METAPLASIA AND HYPERPLASIA 2. Ureter, biopsy, right orifice METASTATIC PROSTATIC ADENOCARCINOMA, GLEASON SCORE 5+5=10 INVOLVES 50% OF THE SPECIMEN 3. Urethra, biopsy, prostatic polyp PROSTATIC ADENOCARCINOMA, GLEASON SCORE 5+4=9 INVOLVES 30% OF THE RESECTED TISSUE 4. Bladder, biopsy, posterior METASTATIC PROSTATIC ADENOCARCINOMA, INVOLVING DETRUSOR MUSCLE BENIGN UROTHELIUM WITH POLYPOID CYSTITIS 5. Prostate, chips PROSTATIC ADENOCARCINOMA, GLEASON SCORE 5+5=10 INVOLVES 80% OF THE RESECTED TISSUE  ADDENDUM: Immunohistochemistry is attempted on the cell block and there is limited tumor present. The scant tumor present is negative with prostein and prostate specific antigen. Called to Dr. Burr Medico and Dr. Ardis Hughs on 09/17/16. (JDP:gt, 09/17/16)      07/25/2016 Initial Diagnosis    Malignant neoplasm of prostate ([place])     09/19/2016 Imaging    CT Chest IMPRESSION: 1. Several small solid pulmonary nodules scattered in both lungs, largest 6 mm, indeterminate but more likely benign given predominantly subpleural location of the nodules. Recommend initial follow-up chest CT in 3 months. 2. No thoracic adenopathy or other potential findings of metastatic disease in the chest. 3. Right upper lobe 1.1 cm ground-glass pulmonary nodule. Initial follow-up with CT at 6-12 months is recommended to confirm persistence. If persistent, repeat CT is recommended  every 2 years until 5 years of stability has been established. This recommendation follows the consensus statement: Guidelines for Management of Incidental Pulmonary Nodules Detected on CT Images: From the Fleischner Society 2017; Radiology 2017; 284:228-243. 4. Mild cardiomegaly. Three-vessel coronary atherosclerosis status post CABG. 5. Multinodular goiter with dominant 3.7 cm right thyroid lobe nodule, for which thyroid ultrasound correlation is warranted, which may be performed if clinically warranted. 6. Re- demonstration of diffuse pancreatic duct dilation due to known pancreatic malignancy.        Pancreatic cancer ([place])   07/08/2016 Imaging    NM Bone Scan Whole Body 07/08/16 IMPRESSION: There are no findings suspicious for metastatic disease to the skeleton.      07/28/2016 Imaging    MR Abd w/wo contrast 07/28/16 IMPRESSION: 1. There is a suspicious lesion involving the head of pancreas and obstructing the pancreatic duct is identified. Involvement of the portal vein and portal venous confluence is suspected. Findings worrisome for pancreatic adenocarcinoma. Further investigation with endoscopic ultrasound and tissue sampling advise. 2. Resolution of bilateral hydronephrosis. Bilateral kidney cysts noted. 3. Aortic atherosclerosis.      08/28/2016 Procedure    EUS by Dr. Ardis Hughs 08/28/16 IMPRESSION:  1.9cm by 1.5cm mass in the head of pancreas causing main pancreatic duct obstruction and directly abutting the portal vein (suggesting invasion). The mass is not obstructing the bile duct. Preliminary reading of FNA shows "very atypical cells."        08/29/2016 Initial Biopsy    Diagnosis 08/29/16 FINE NEEDLE ASPIRATION, ENDOSCOPIC, PANCREAS HEAD(SPECIMEN 1 OF 1 COLLECTED 08/28/16): POORLY DIFFERENTIATED CARCINOMA ASSOCIATED WITH ACUTE INFLAMMATION. SEE COMMENT. Comment: There are a few cells with features suggestive of adenocarcinoma.      09/12/2016 Initial  Diagnosis  Pancreatic cancer ([place])     09/19/2016 Imaging    CT Chest WO Contrast 09/19/16 IMPRESSION: 1. Several small solid pulmonary nodules scattered in both lungs, largest 6 mm, indeterminate but more likely benign given predominantly subpleural location of the nodules. Recommend initial follow-up chest CT in 3 months. 2. No thoracic adenopathy or other potential findings of metastatic disease in the chest. 3. Right upper lobe 1.1 cm ground-glass pulmonary nodule. Initial follow-up with CT at 6-12 months is recommended to confirm persistence. If persistent, repeat CT is recommended every 2 years until 5 years of stability has been established. This recommendation follows the consensus statement: Guidelines for Management of Incidental Pulmonary Nodules Detected on CT Images: From the Fleischner Society 2017; Radiology 2017; 284:228-243. 4. Mild cardiomegaly. Three-vessel coronary atherosclerosis status post CABG. 5. Multinodular goiter with dominant 3.7 cm right thyroid lobe nodule, for which thyroid ultrasound correlation is warranted, which may be performed if clinically warranted. 6. Re- demonstration of diffuse pancreatic duct dilation due to known pancreatic malignancy.      09/26/2016 -  Chemotherapy    neoadjuvant Gemcitabine and Abraxane on day 1, 8 every 3 weeks started 09/26/16, will hold Abraxane for first 1-2 doses, reduced gemcitabine dosed to 800 mg/m       CURRENT THERAPY: neoadjuvant Gemcitabine and Abraxane on day 1, 8 every 21 days, Gemcitabine reduced dose to 800 mg/m^2, Held Abraxane for first 2 doses started 09/26/16  INTERVAL HISTORY: [name] returns today for follow-up as scheduled with one of his sons. He received cycle 1 day 15 gemcitabine/Abraxane with dose reduction on 10/14/2016. He reports having extreme difficulty after the infusion, a telephone note indicates a symptom management appointment was made for 10/24/2016 however the patient did not  return for that visit. In addition he missed follow-up prior to beginning cycle 2 on 10/28/2016. Patient reports he does not a phone, the only phone number listed on his chart belongs to his son who works full time. He reports extreme weakness, swelling in both arms and legs, nausea and vomiting every other day for 3 weeks. He took Zofran 1 which made him vomit, he did not take any subsequent doses. He did not try Compazine. He had significant decreased by mouth intake. He continues to have fluctuating edema, right greater than left, he denies calf tenderness. He has improved over the last few days, today he denies fever, chills, cough, chest pain, dyspnea, diarrhea, bleeding, abdominal pain.   REVIEW OF SYSTEMS:   Constitutional: Denies fevers, chills or abnormal weight loss (+) mild fatigue, improving (+) decreased appetite and taste Eyes: Denies blurriness of vision Ears, nose, mouth, throat, and face: Denies mucositis or sore throat Respiratory: Denies cough, dyspnea or wheezes Cardiovascular: Denies palpitation, chest discomfort (+) lower extremity swelling, right greater than left, no calf tenderness Gastrointestinal:  Denies heartburn or change in bowel habits (+) nausea with frequent emesis3 weeks after chemotherapy, used Zofran 1 without relief, did not try Compazine Skin: Denies abnormal skin rashes Lymphatics: Denies new lymphadenopathy or easy bruising Neurological:Denies numbness, tingling or new weaknesses Behavioral/Psych: Mood is stable, no new changes  All other systems were reviewed with the patient and are negative.  MEDICAL HISTORY:  Past Medical History:  Diagnosis Date  . Acute MI, inferior wall ([place]) 1966  . Amputation finger    right first finger top portion age 39  . CHF (congestive heart failure) ([place])   . Coronary artery disease    severe 3 vessel   . Fall   .  Foley catheter in place   . H/O hyperkalemia   . H/O thrombocytosis   . Hip fracture, left ([place])     11/2014  . History of blood transfusion   . History of metabolic acidosis   . History of tobacco abuse   . Hypertension   . Obstructive uropathy   . Pneumonia    childhood   . Prostate cancer ([place])   . S/P VSD repair    09/2009  . Trichomonas infection   . Urethral stricture due to infective diseases classified elsewhere   . Urinary tract infection     SURGICAL HISTORY: Past Surgical History:  Procedure Laterality Date  . CORONARY ARTERY BYPASS GRAFT     times 4  . CYSTOSCOPY W/ RETROGRADES N/A 04/03/2015   Procedure:  BLADDER BIOPSIES;  Surgeon: Festus Aloe, MD;  Location: [facility];  Service: Urology;  Laterality: N/A;  . CYSTOSCOPY W/ RETROGRADES Bilateral 06/27/2016   Procedure: CYSTOSCOPY WITH BILATERAL RETROGRADES;  Surgeon: Festus Aloe, MD;  Location: [facility];  Service: Urology;  Laterality: Bilateral;  . CYSTOSCOPY WITH BIOPSY N/A 06/27/2016   Procedure: CYSTOSCOPY WITH BLADDER BIOPSY URETHRAL BIOPSY;  Surgeon: Festus Aloe, MD;  Location: [facility];  Service: Urology;  Laterality: N/A;  . CYSTOSCOPY WITH URETHRAL DILATATION N/A 04/03/2015   Procedure: CYSTOSCOPY WITH BILATERAL RETROGRADES;  Surgeon: Festus Aloe, MD;  Location: [facility];  Service: Urology;  Laterality: N/A;  . EUS N/A 08/28/2016   Procedure: UPPER ENDOSCOPIC ULTRASOUND (EUS) RADIAL;  Surgeon: Milus Banister, MD;  Location: [facility];  Service: Endoscopy;  Laterality: N/A;  . flexible cystoscopy    . repair of post infarction posterior ventricular septal defect    . TRANSURETHRAL RESECTION OF PROSTATE  06/27/2016   Procedure: TRANSURETHRAL RESECTION OF THE PROSTATE (TURP);  Surgeon: Festus Aloe, MD;  Location: [facility];  Service: Urology;;    I have reviewed the social history and family history with the patient and they are unchanged from previous note.  ALLERGIES:  is allergic to chlorhexidine gluconate.  MEDICATIONS:  Current Outpatient Prescriptions  Medication Sig Dispense Refill    . amLODipine (NORVASC) 10 MG tablet take 1/2 tablet by mouth once daily 30 tablet 11  . carvedilol (COREG) 12.5 MG tablet Take 1 tablet (12.5 mg total) by mouth 2 (two) times daily with a meal. KEEP OV. 30 tablet 0  . ondansetron (ZOFRAN) 8 MG tablet Take 1 tablet (8 mg total) by mouth 2 (two) times daily as needed (Nausea or vomiting). 30 tablet 1  . prochlorperazine (COMPAZINE) 10 MG tablet Take 1 tablet (10 mg total) by mouth every 6 (six) hours as needed (Nausea or vomiting). 30 tablet 1  . rosuvastatin (CRESTOR) 40 MG tablet Take 1 tablet (40 mg total) by mouth daily. KEEP OV. 30 tablet 0   No current facility-administered medications for this visit.    Facility-Administered Medications Ordered in Other Visits  Medication Dose Route Frequency Provider Last Rate Last Dose  . ceFAZolin (ANCEF) IVPB 2g/100 mL premix  2 g Intravenous 30 min Pre-Op Festus Aloe, MD        PHYSICAL EXAMINATION: ECOG PERFORMANCE STATUS: 2 - Symptomatic, <50% confined to bed  Vitals:   11/04/16 0937  BP: (!) 171/94  Pulse: 64  Resp: 18  Temp: 98.2 F (36.8 C)  SpO2: 100%   Filed Weights   11/04/16 0937  Weight: 200 lb 3.2 oz (90.8 kg)    GENERAL:alert, no distress and comfortable SKIN: skin color, texture are  normal, no rashes or significant lesions (+) dry skin EYES: normal, Conjunctiva are pink and non-injected, sclera clear OROPHARYNX:no exudate, no erythema and lips, buccal mucosa, and tongue normal  NECK: supple, thyroid normal size, non-tender, without nodularity LYMPH:  no palpable cervical or supraclavicular lymphadenopathy  LUNGS: clear to auscultation bilaterally with normal breathing effort HEART: regular rate & rhythm and no murmurs. (+) asymmetric lower extremity edema, right greater than left, no palpable cord ABDOMEN:abdomen soft, non-tender and normal bowel sounds Musculoskeletal:no cyanosis of digits and no clubbing  NEURO: alert & oriented x 3 with fluent speech, no focal  motor/sensory deficits  LABORATORY DATA:  I have reviewed the data as listed CBC Latest Ref Rng & Units 11/04/2016 10/14/2016 10/03/2016  WBC 4.0 - 10.3 10e3/uL 8.1 4.0 4.1  Hemoglobin 13.0 - 17.1 g/dL 9.1(L) 9.9(L) 10.6(L)  Hematocrit 38.4 - 49.9 % 28.1(L) 30.3(L) 32.2(L)  Platelets 140 - 400 10e3/uL 699(H) 318 198     CMP Latest Ref Rng & Units 11/04/2016 10/14/2016 10/03/2016  Glucose 70 - 140 mg/dl 97 101 146(H)  BUN 7.0 - 26.0 mg/dL 28.6(H) 42.0(H) 66.5(H)  Creatinine 0.7 - 1.3 mg/dL 2.5(H) 2.6(H) 3.4(HH)  Sodium 136 - 145 mEq/L 139 137 134(L)  Potassium 3.5 - 5.1 mEq/L 4.3 4.1 3.8  Chloride 101 - 111 mmol/L - - -  CO2 22 - 29 mEq/L 20(L) 14(L) 12(L)  Calcium 8.4 - 10.4 mg/dL 9.6 10.0 10.1  Total Protein 6.4 - 8.3 g/dL 7.7 8.2 8.5(H)  Total Bilirubin 0.20 - 1.20 mg/dL <0.22 <0.22 0.37  Alkaline Phos 40 - 150 U/L 106 90 100  AST 5 - 34 U/L 16 16 19   ALT 0 - 55 U/L 8 12 16     RADIOGRAPHIC STUDIES: I have personally reviewed the radiological images as listed and agreed with the findings in the report. No results found.   ASSESSMENT & PLAN: 71 y.o. African-American male, with past medical history of CAD, and recently diagnosed prostate cancer, gleason score 10, presented with incidental imaging finding of a pancreatic head mass.  1. Carcinoma of the head of the pancreas, cT1cN0M0,stage IA 2. CHF, HTN, CAD status post CABG 4 3. Newly diagnosed prostate cancer, Gleason score 10 4. CK D, stage III 5. Thyroid nodule 6. Lung nodules 7. Lower extremity edema, right greater than left  [name] appears stable today but poorly tolerated cycle 1 day 16 gemcitabine/abraxane on 10/2. He had significant weakness, decreased po intake, and n/v without optimal anti-emetic use. We reviewed nausea meds today, he seems to tolerate compazine in the infusion room, he will take this primarily and add zofran only if compazine is insufficient. He does not feel ready to proceed with chemotherapy  today. He has lower extremity edema, R>L today; I ordered LE doppler to rule out DVT, to be done 11/05/16. Swelling may also be from amlodipine and known CHF. BP elevated, has not taken BP med today; he will take once he gets home. Weight stable. CBC and Cmet stable overall; creatinine stable at 2.5. I have encouraged him to drink more water. Will f/u on CA 19-9. We will rescan after next cycle, MR abdomen wo contrast. We will monitor lung nodules with CT chest wo contrast both on 11/13. He has renal disease and cannot get contrast. We will present his case at GI tumor board and f/u with patient same day. He will return for next cycle gemcitabine only on 10/30 and 11/6.   PLAN:  -LE doppler 11/05/16 to evaluate leg  edema -delay chemo today x1 week; return 10/30 -cycle 2 day 1 gemcitabine only 10/30, day 8 11/6 -MR abdomen wo contrast and CT chest wo contrast 11/13; f/u 11/14  No problem-specific Assessment & Plan notes found for this encounter.   Orders Placed This Encounter  Procedures  . MR Abdomen Wo Contrast    Standing Status:   Future    Standing Expiration Date:   11/04/2017    Order Specific Question:   What is the patient's sedation requirement?    Answer:   No Sedation    Order Specific Question:   Does the patient have a pacemaker or implanted devices?    Answer:   No    Order Specific Question:   Preferred imaging location?    Answer:   [facility] (table limit-350 lbs)    Order Specific Question:   Radiology Contrast Protocol - do NOT remove file path    Answer:   [email or web address]    Order Specific Question:   Reason for Exam additional comments    Answer:   pancreatic cancer on chemotherapy, evaluate response to therapy  . CT Chest Wo Contrast    Standing Status:   Future    Standing Expiration Date:   11/04/2017    Order Specific Question:   Preferred imaging location?    Answer:   [facility]    Order Specific Question:    Radiology Contrast Protocol - do NOT remove file path    Answer:   [email or web address]    Order Specific Question:   Reason for Exam additional comments    Answer:   pancreatic cancer on chemotherapy, evaluate response to therapy  . Ambulatory referral to Social Work    Referral Priority:   Urgent    Referral Type:   Consultation    Referral Reason:   Specialty Services Required    Number of Visits Requested:   1   All questions were answered. The patient knows to call the clinic with any problems, questions or concerns. No barriers to learning was detected.  I spent 25 counseling the patient face to face. The total time spent in the appointment was 30 and more than 50% was on counseling and review of test results     Alla Feeling, NP 11/04/16   I have seen the patient, examined him. I agree with the assessment and and plan and have edited the notes.   Hagan is slowly recovering from last cycle chemo, could not tolerated both gem and Abraxane, will do one more cycle gem alone in next 2 weeks, and restaging. Will discuss in GI tumor board after scan for further treatment.   Truitt Merle 11/04/2016

## 2016-11-04 NOTE — Telephone Encounter (Signed)
Printed paient avs and calender for upcoming appointment. Per 10/23 los

## 2016-11-05 ENCOUNTER — Ambulatory Visit (HOSPITAL_COMMUNITY)
Admission: RE | Admit: 2016-11-05 | Discharge: 2016-11-05 | Disposition: A | Discharge status: Home / Self Care | Payer: Medicare Other | Source: Ambulatory Visit | Attending: Nurse Practitioner | Admitting: Nurse Practitioner

## 2016-11-05 ENCOUNTER — Ambulatory Visit (HOSPITAL_BASED_OUTPATIENT_CLINIC_OR_DEPARTMENT_OTHER): Payer: Medicare Other

## 2016-11-05 ENCOUNTER — Telehealth: Payer: Self-pay | Admitting: Hematology

## 2016-11-05 ENCOUNTER — Ambulatory Visit (HOSPITAL_BASED_OUTPATIENT_CLINIC_OR_DEPARTMENT_OTHER): Payer: Medicare Other | Admitting: Hematology

## 2016-11-05 DIAGNOSIS — I82411 Acute embolism and thrombosis of right femoral vein: Secondary | ICD-10-CM | POA: Diagnosis not present

## 2016-11-05 DIAGNOSIS — C250 Malignant neoplasm of head of pancreas: Secondary | ICD-10-CM

## 2016-11-05 DIAGNOSIS — R9389 Abnormal findings on diagnostic imaging of other specified body structures: Secondary | ICD-10-CM | POA: Insufficient documentation

## 2016-11-05 DIAGNOSIS — I82812 Embolism and thrombosis of superficial veins of left lower extremities: Secondary | ICD-10-CM | POA: Diagnosis not present

## 2016-11-05 DIAGNOSIS — I824Z1 Acute embolism and thrombosis of unspecified deep veins of right distal lower extremity: Secondary | ICD-10-CM | POA: Insufficient documentation

## 2016-11-05 DIAGNOSIS — I255 Ischemic cardiomyopathy: Secondary | ICD-10-CM

## 2016-11-05 DIAGNOSIS — I82409 Acute embolism and thrombosis of unspecified deep veins of unspecified lower extremity: Secondary | ICD-10-CM

## 2016-11-05 DIAGNOSIS — I82431 Acute embolism and thrombosis of right popliteal vein: Secondary | ICD-10-CM

## 2016-11-05 DIAGNOSIS — C259 Malignant neoplasm of pancreas, unspecified: Secondary | ICD-10-CM

## 2016-11-05 DIAGNOSIS — M7989 Other specified soft tissue disorders: Secondary | ICD-10-CM | POA: Diagnosis not present

## 2016-11-05 HISTORY — DX: Acute embolism and thrombosis of unspecified deep veins of unspecified lower extremity: I82.409

## 2016-11-05 LAB — PROTIME-INR
INR: 1.1 — ABNORMAL LOW (ref 2.00–3.50)
Protime: 13.2 Seconds (ref 10.6–13.4)

## 2016-11-05 LAB — CANCER ANTIGEN 19-9: CA 19-9: 20 U/mL (ref 0–35)

## 2016-11-05 MED ORDER — WARFARIN SODIUM 5 MG PO TABS: 5.0000 mg | ORAL_TABLET | Freq: Every day | ORAL | 0 refills | Status: DC

## 2016-11-05 NOTE — Progress Notes (Signed)
[name]  [phone]) [phone] [phone]) [phone]  Clinic Follow-up  Note   Patient Care Team: Patient, No Pcp Per as PCP - General (General Practice) 11/05/2016   CHIEF COMPLAINTS:  Newly diagnosed b/l LE DVT  Oncology History   Cancer Staging Pancreatic cancer [facility]) Staging form: Exocrine Pancreas, AJCC 8th Edition - Clinical: Stage IA (cT1c, cN0, cM0) - Signed by Truitt Merle, MD on 09/14/2016       Malignant neoplasm of prostate ([place])   06/27/2016 Initial Biopsy    Diagnosis 06/27/16 1. Urethra, biopsy, bulb KERATINIZED SQUAMOUS CELL METAPLASIA AND HYPERPLASIA 2. Ureter, biopsy, right orifice METASTATIC PROSTATIC ADENOCARCINOMA, GLEASON SCORE 5+5=10 INVOLVES 50% OF THE SPECIMEN 3. Urethra, biopsy, prostatic polyp PROSTATIC ADENOCARCINOMA, GLEASON SCORE 5+4=9 INVOLVES 30% OF THE RESECTED TISSUE 4. Bladder, biopsy, posterior METASTATIC PROSTATIC ADENOCARCINOMA, INVOLVING DETRUSOR MUSCLE BENIGN UROTHELIUM WITH POLYPOID CYSTITIS 5. Prostate, chips PROSTATIC ADENOCARCINOMA, GLEASON SCORE 5+5=10 INVOLVES 80% OF THE RESECTED TISSUE  ADDENDUM: Immunohistochemistry is attempted on the cell block and there is limited tumor present. The scant tumor present is negative with prostein and prostate specific antigen. Called to Dr. Burr Medico and Dr. Ardis Hughs on 09/17/16. (JDP:gt, 09/17/16)      07/25/2016 Initial Diagnosis    Malignant neoplasm of prostate ([place])     09/19/2016 Imaging    CT Chest IMPRESSION: 1. Several small solid pulmonary nodules scattered in both lungs, largest 6 mm, indeterminate but more likely benign given predominantly subpleural location of the nodules. Recommend initial follow-up chest CT in 3 months. 2. No thoracic adenopathy or other potential findings of metastatic disease in the chest. 3. Right upper lobe 1.1 cm ground-glass pulmonary nodule. Initial follow-up with CT at 6-12 months is recommended to confirm persistence. If persistent, repeat  CT is recommended every 2 years until 5 years of stability has been established. This recommendation follows the consensus statement: Guidelines for Management of Incidental Pulmonary Nodules Detected on CT Images: From the Fleischner Society 2017; Radiology 2017; 284:228-243. 4. Mild cardiomegaly. Three-vessel coronary atherosclerosis status post CABG. 5. Multinodular goiter with dominant 3.7 cm right thyroid lobe nodule, for which thyroid ultrasound correlation is warranted, which may be performed if clinically warranted. 6. Re- demonstration of diffuse pancreatic duct dilation due to known pancreatic malignancy.        Pancreatic cancer ([place])   07/08/2016 Imaging    NM Bone Scan Whole Body 07/08/16 IMPRESSION: There are no findings suspicious for metastatic disease to the skeleton.      07/28/2016 Imaging    MR Abd w/wo contrast 07/28/16 IMPRESSION: 1. There is a suspicious lesion involving the head of pancreas and obstructing the pancreatic duct is identified. Involvement of the portal vein and portal venous confluence is suspected. Findings worrisome for pancreatic adenocarcinoma. Further investigation with endoscopic ultrasound and tissue sampling advise. 2. Resolution of bilateral hydronephrosis. Bilateral kidney cysts noted. 3. Aortic atherosclerosis.      08/28/2016 Procedure    EUS by Dr. Ardis Hughs 08/28/16 IMPRESSION:  1.9cm by 1.5cm mass in the head of pancreas causing main pancreatic duct obstruction and directly abutting the portal vein (suggesting invasion). The mass is not obstructing the bile duct. Preliminary reading of FNA shows "very atypical cells."        08/29/2016 Initial Biopsy    Diagnosis 08/29/16 FINE NEEDLE ASPIRATION, ENDOSCOPIC, PANCREAS HEAD(SPECIMEN 1 OF 1 COLLECTED 08/28/16): POORLY DIFFERENTIATED CARCINOMA ASSOCIATED WITH ACUTE INFLAMMATION. SEE COMMENT. Comment: There are a few cells with features suggestive of adenocarcinoma.  09/12/2016 Initial Diagnosis    Pancreatic cancer ([place])     09/19/2016 Imaging    CT Chest WO Contrast 09/19/16 IMPRESSION: 1. Several small solid pulmonary nodules scattered in both lungs, largest 6 mm, indeterminate but more likely benign given predominantly subpleural location of the nodules. Recommend initial follow-up chest CT in 3 months. 2. No thoracic adenopathy or other potential findings of metastatic disease in the chest. 3. Right upper lobe 1.1 cm ground-glass pulmonary nodule. Initial follow-up with CT at 6-12 months is recommended to confirm persistence. If persistent, repeat CT is recommended every 2 years until 5 years of stability has been established. This recommendation follows the consensus statement: Guidelines for Management of Incidental Pulmonary Nodules Detected on CT Images: From the Fleischner Society 2017; Radiology 2017; 284:228-243. 4. Mild cardiomegaly. Three-vessel coronary atherosclerosis status post CABG. 5. Multinodular goiter with dominant 3.7 cm right thyroid lobe nodule, for which thyroid ultrasound correlation is warranted, which may be performed if clinically warranted. 6. Re- demonstration of diffuse pancreatic duct dilation due to known pancreatic malignancy.      09/26/2016 -  Chemotherapy    neoadjuvant Gemcitabine and Abraxane on day 1, 8 every 3 weeks started 09/26/16, will hold Abraxane for first 1-2 doses, reduced gemcitabine dosed to 800 mg/m        HISTORY OF PRESENTING ILLNESS: 09/12/16 [name] 71 y.o. male is here because of pancreatic cancer. Approximately one year ago, pt was admitted for urinary retention. Dr Junious Silk met him on service in the hospital. Since then he has been followed by closely [facility] for ongoing BPH with urinary retention. He did have a foley catheter in place for 3-4 months ago until approximately one month ago when this was removed. Pt had a DRE in 06/2016 which showed some induration, he underwent  TURP with prostate biopsy following this on 06/27/2016. His pathology revealed Gleason 5+5 disease in all TUR chips with local invasion into the bladder detrusor muscle. He had a PSA performed on 07/11/16 which was 17.5. Pt recently had consult with radiation oncology and he has been taking injections since. He has not been tolerating any other treatments for his prostate cancer. He also had a CT A/p and NM Bone scan performed on 07/11/16. His bone scan was negative for any osseous metastatic abnormalities; however, his CT scan showed a single pathologically enlarged pelvic lymph node as well as dilation of the main pancreatic duct without discrete pancreatic mass. Subsequently he underwent MR A/p on 07/28/16 which showed a suspicious lesion involving the head of the pancreas and obstructed the pancreatic duct. Involvement of the portal vein and portal venous confluence was suspected, per radiology. Overall, pt is a poor historian.   He was subsequently referred by Dr Ardis Hughs to medical oncology for further management. He is not currently followed by a PCP. He denies abdominal pain, diarrhea, constipation, unexpected weight loss, loss of appetite, chest pain, shortness of breath, fatigue. He is otherwise asymptomatic and feeling at his baseline today. Of note, he states that he did not take his prescribed BP medications this morning, 09/12/16.   He stopped smoking ~13 years ago, and is only an occasional drinker. He is currently retired from [employer]. He is currently single and does have five children, two of which are his step-sons. He states that he lives in an apartment here in [place] with his two youngest sons, ages 1 and 93yo.   CURRENT THERAPY: neoadjuvant Gemcitabine and Abraxane on day 1, 8 every 21  days, Gemcitabine reduced dose to 800 mg/m^2, Held Abraxane for first 2 doses started 09/26/16  INTERVAL HISTORY:  KI CORBO is here for a follow up of his doppler results form today. He notes  his  Legs having been swelling with tightness 2-3 weeks ago. He feels pain upon pressure and experiences some SOB. He denies bleeding issues in the past and thinks he may been on blood thinners when he had open heart surgery.   He would like his son to be notified of this and he was called in clinic.     MEDICAL HISTORY:  Past Medical History:  Diagnosis Date  . Acute MI, inferior wall ([place]) 1966  . Amputation finger    right first finger top portion age 74  . CHF (congestive heart failure) ([place])   . Coronary artery disease    severe 3 vessel   . Fall   . Foley catheter in place   . H/O hyperkalemia   . H/O thrombocytosis   . Hip fracture, left ([place])    11/2014  . History of blood transfusion   . History of metabolic acidosis   . History of tobacco abuse   . Hypertension   . Obstructive uropathy   . Pneumonia    childhood   . Prostate cancer ([place])   . S/P VSD repair    09/2009  . Trichomonas infection   . Urethral stricture due to infective diseases classified elsewhere   . Urinary tract infection     SURGICAL HISTORY: Past Surgical History:  Procedure Laterality Date  . CORONARY ARTERY BYPASS GRAFT     times 4  . CYSTOSCOPY W/ RETROGRADES N/A 04/03/2015   Procedure:  BLADDER BIOPSIES;  Surgeon: Festus Aloe, MD;  Location: [facility];  Service: Urology;  Laterality: N/A;  . CYSTOSCOPY W/ RETROGRADES Bilateral 06/27/2016   Procedure: CYSTOSCOPY WITH BILATERAL RETROGRADES;  Surgeon: Festus Aloe, MD;  Location: [facility];  Service: Urology;  Laterality: Bilateral;  . CYSTOSCOPY WITH BIOPSY N/A 06/27/2016   Procedure: CYSTOSCOPY WITH BLADDER BIOPSY URETHRAL BIOPSY;  Surgeon: Festus Aloe, MD;  Location: [facility];  Service: Urology;  Laterality: N/A;  . CYSTOSCOPY WITH URETHRAL DILATATION N/A 04/03/2015   Procedure: CYSTOSCOPY WITH BILATERAL RETROGRADES;  Surgeon: Festus Aloe, MD;  Location: [facility];  Service: Urology;  Laterality: N/A;  . EUS N/A 08/28/2016    Procedure: UPPER ENDOSCOPIC ULTRASOUND (EUS) RADIAL;  Surgeon: Milus Banister, MD;  Location: [facility];  Service: Endoscopy;  Laterality: N/A;  . flexible cystoscopy    . repair of post infarction posterior ventricular septal defect    . TRANSURETHRAL RESECTION OF PROSTATE  06/27/2016   Procedure: TRANSURETHRAL RESECTION OF THE PROSTATE (TURP);  Surgeon: Festus Aloe, MD;  Location: [facility];  Service: Urology;;    SOCIAL HISTORY: Social History   Social History  . Marital status: Divorced    Spouse name: N/A  . Number of children: N/A  . Years of education: N/A   Occupational History  . Not on file.   Social History Main Topics  . Smoking status: Former Smoker    Packs/day: 1.50    Years: 45.00    Types: Cigarettes    Quit date: 01/13/2010  . Smokeless tobacco: Never Used  . Alcohol use No     Comment: occasional   . Drug use: No  . Sexual activity: Not on file   Other Topics Concern  . Not on file   Social History Narrative  . No  narrative on file    FAMILY HISTORY: Family History  Problem Relation Age of Onset  . Cancer Paternal Aunt        unknown type cancer   . Cancer Paternal Uncle        unknown type cancer    ALLERGIES:  is allergic to chlorhexidine gluconate.  MEDICATIONS:  Current Outpatient Prescriptions  Medication Sig Dispense Refill  . amLODipine (NORVASC) 10 MG tablet take 1/2 tablet by mouth once daily 30 tablet 11  . carvedilol (COREG) 12.5 MG tablet Take 1 tablet (12.5 mg total) by mouth 2 (two) times daily with a meal. KEEP OV. 30 tablet 0  . ondansetron (ZOFRAN) 8 MG tablet Take 1 tablet (8 mg total) by mouth 2 (two) times daily as needed (Nausea or vomiting). 30 tablet 1  . prochlorperazine (COMPAZINE) 10 MG tablet Take 1 tablet (10 mg total) by mouth every 6 (six) hours as needed (Nausea or vomiting). 30 tablet 1  . rosuvastatin (CRESTOR) 40 MG tablet Take 1 tablet (40 mg total) by mouth daily. KEEP OV. 30 tablet 0   No current  facility-administered medications for this visit.    Facility-Administered Medications Ordered in Other Visits  Medication Dose Route Frequency Provider Last Rate Last Dose  . ceFAZolin (ANCEF) IVPB 2g/100 mL premix  2 g Intravenous 30 min Pre-Op Festus Aloe, MD        REVIEW OF SYSTEMS:   Constitutional: Denies fevers, chills or abnormal night sweats Eyes: Denies blurriness of vision, double vision or watery eyes Ears, nose, mouth, throat, and face: Denies mucositis or sore throat Respiratory: Denies cough, dyspnea or wheezes (+) SOB Cardiovascular: Denies palpitation, chest discomfort (+) lower extremity swelling L>R, tightness and pain Gastrointestinal:  Denies nausea, heartburn or change in bowel habits Skin: Denies abnormal skin rashes Lymphatics: Denies new lymphadenopathy or easy bruising Neurological:Denies numbness, tingling or new weaknesses Behavioral/Psych: Mood is stable, no new changes  All other systems were reviewed with the patient and are negative.  PHYSICAL EXAMINATION:  ECOG PERFORMANCE STATUS: 1 GENERAL:alert, no distress and comfortable SKIN: skin color, texture, turgor are normal, no rashes or significant lesions EYES: normal, conjunctiva are pink and non-injected, sclera clear OROPHARYNX:no exudate, no erythema and lips, buccal mucosa, and tongue normal  NECK: supple, thyroid normal size, non-tender, without nodularity LYMPH:  no palpable lymphadenopathy in the cervical, axillary or inguinal LUNGS: clear to auscultation and percussion with normal breathing effort HEART: regular rate & rhythm and no murmurs and no lower extremity edema ABDOMEN:abdomen soft, non-tender and normal bowel sounds Musculoskeletal:no cyanosis of digits and no clubbing  PSYCH: alert & oriented x 3 with fluent speech NEURO: no focal motor/sensory deficits   LABORATORY DATA:  I have reviewed the data as listed CBC Latest Ref Rng & Units 11/04/2016 10/14/2016 10/03/2016  WBC 4.0  - 10.3 10e3/uL 8.1 4.0 4.1  Hemoglobin 13.0 - 17.1 g/dL 9.1(L) 9.9(L) 10.6(L)  Hematocrit 38.4 - 49.9 % 28.1(L) 30.3(L) 32.2(L)  Platelets 140 - 400 10e3/uL 699(H) 318 198   CMP Latest Ref Rng & Units 11/04/2016 10/14/2016 10/03/2016  Glucose 70 - 140 mg/dl 97 101 146(H)  BUN 7.0 - 26.0 mg/dL 28.6(H) 42.0(H) 66.5(H)  Creatinine 0.7 - 1.3 mg/dL 2.5(H) 2.6(H) 3.4(HH)  Sodium 136 - 145 mEq/L 139 137 134(L)  Potassium 3.5 - 5.1 mEq/L 4.3 4.1 3.8  Chloride 101 - 111 mmol/L - - -  CO2 22 - 29 mEq/L 20(L) 14(L) 12(L)  Calcium 8.4 - 10.4 mg/dL 9.6 10.0  10.1  Total Protein 6.4 - 8.3 g/dL 7.7 8.2 8.5(H)  Total Bilirubin 0.20 - 1.20 mg/dL <0.22 <0.22 0.37  Alkaline Phos 40 - 150 U/L 106 90 100  AST 5 - 34 U/L '16 16 19  '$ ALT 0 - 55 U/L '8 12 16   '$ Bilateral lower extremity venous ultrasound 11/05/2016 Final Interpretation Right There is evidence of acute DVT in the calf veins.  Left There is evidence of acute superficial thrombosis of the great saphenous vein. A cystic structure is found in the popliteal fossa. Left femoral artery appears occluded.   ASSESSMENT: 71 y.o. African-American male, with past medical history of CAD, and recently diagnosed prostate cancer, gleason score 10, and pancreatic cancer on neoadjuvant chemo, presented with b/l low extremities edema   1. Acute R leg DVT -LE Doppler from 11/05/16 shows acute thrombosis in the right calf pain, and acute superficial thrombosis of the left great saphenous vein -His DVT is probably caused by his underlying malignancy and chemotherapy -I discussed the consequences of untreated DVT, and recommend him to start anticoagulation. The benefit and potential side effects, especially risk of bleeding were discussed with patient in details. -I discussed rise anticoagulation options. Due to his CKD, his only options are Coumadin or Eliquis. I discussed the prons and cons of each agent. I recommend him to start coumadin, he agrees.  -I will start  him on coumadin '5mg'$  dialy today, due to his CKD, he is not able to take lovenox injection to bridge over. -He has no clinical suspicion of PE, I do not think he needs to be admitted for anticoagulation -I will monitor his PT/INR closely, and adjust his coumadin dose as needed. The goal of INR 2-3 -I advised to watch for falls and watch for bleeding, chest pain, or SOB while on Coumadin. If developed he should contact clinic and go to ER.   2. PVD -Lower extremity Doppler showed left femoral artery occlusion -he is not symptomatic, no urgent intervention needed. Will consider referral to vascular surgeon   PLAN:  -Lab today for baseline PT/INR  -Prescribe Coumadin today, start at '5mg'$  daily  -he will return on 10/29 for lab including PT/INR, and also start chemo as we discussed yesterday  -I called his son in the clinic and updated him the above, he agrees with the plan    All questions were answered. The patient knows to call the clinic with any problems, questions or concerns. I spent 15 minutes counseling the patient face to face. The total time spent in the appointment was 20 minutes and more than 50% was on counseling.  This document serves as a record of services personally performed by Truitt Merle, MD. It was created on her behalf by Joslyn Devon, a trained medical scribe. The creation of this record is based on the scribe's personal observations and the provider's statements to them. This document has been checked and approved by the attending provider.    Truitt Merle, MD 11/05/2016

## 2016-11-05 NOTE — Progress Notes (Signed)
*  PRELIMINARY RESULTS* Vascular Ultrasound Bilateral lower extremity venous duplex has been completed.  Preliminary findings: Acute deep vein thrombosis noted in the right proximal-mid peroneal veins.  Other visualized veins of the right lower extremity appear negative for thrombosis.  The left lower extremity appears negative for deep vein thrombosis, however a small segment of superficial vein thrombosis is noted in the left greater saphenous vein, proximal to mid calf.  Incidentally noted: Left sided bakers cyst.  Left femoral artery appears nearly occluded, monophasic flow noted in the left distal calf arteries and left dorsal pedis artery.  Preliminary resutls called to Cira Rue @ [place] [facility] Terianna Peggs 11/05/2016, 9:11 AM

## 2016-11-05 NOTE — Telephone Encounter (Signed)
Scheduled lab and f/u per 10/24 los. Emailed cindy regarding appts that needed to be added on 10/29 and 11/5

## 2016-11-06 ENCOUNTER — Telehealth (HOSPITAL_COMMUNITY): Payer: Self-pay | Admitting: Cardiology

## 2016-11-06 ENCOUNTER — Encounter: Payer: Self-pay | Admitting: Hematology

## 2016-11-06 DIAGNOSIS — I82409 Acute embolism and thrombosis of unspecified deep veins of unspecified lower extremity: Secondary | ICD-10-CM | POA: Insufficient documentation

## 2016-11-06 NOTE — Telephone Encounter (Signed)
User: [name] A Date/time: 10/28/2016 2:07 PM  Comment: Called pt and spoke with patient son , who voiced that he has had trouble getting in contact with him. Once he is able to contact him he will CB..RG  Context: [place] Schedule Orders/Appt Requests Outcome: Completed  Phone number: [phone] Phone Type: Home Phone  Comm. type: Telephone Call type: Outgoing  Contact: [name] L Relation to patient: Self  Letter:       Patient has cancelled appts on 10/3 and 10/9

## 2016-11-07 ENCOUNTER — Telehealth: Payer: Self-pay | Admitting: Nurse Practitioner

## 2016-11-07 NOTE — Telephone Encounter (Signed)
Scheduled appt per 10/23 sch message - patient is aware of appts added.

## 2016-11-08 ENCOUNTER — Other Ambulatory Visit: Payer: Self-pay | Admitting: Cardiology

## 2016-11-10 ENCOUNTER — Ambulatory Visit: Payer: Self-pay | Admitting: Nurse Practitioner

## 2016-11-11 ENCOUNTER — Ambulatory Visit (HOSPITAL_BASED_OUTPATIENT_CLINIC_OR_DEPARTMENT_OTHER): Payer: Medicare Other | Admitting: Nurse Practitioner

## 2016-11-11 ENCOUNTER — Other Ambulatory Visit (HOSPITAL_BASED_OUTPATIENT_CLINIC_OR_DEPARTMENT_OTHER): Payer: Medicare Other | Admitting: *Deleted

## 2016-11-11 ENCOUNTER — Encounter: Payer: Self-pay | Admitting: Nurse Practitioner

## 2016-11-11 ENCOUNTER — Ambulatory Visit (HOSPITAL_COMMUNITY): Payer: Self-pay

## 2016-11-11 ENCOUNTER — Other Ambulatory Visit: Payer: Self-pay | Admitting: Nurse Practitioner

## 2016-11-11 ENCOUNTER — Ambulatory Visit (HOSPITAL_BASED_OUTPATIENT_CLINIC_OR_DEPARTMENT_OTHER): Payer: Medicare Other

## 2016-11-11 ENCOUNTER — Other Ambulatory Visit (HOSPITAL_BASED_OUTPATIENT_CLINIC_OR_DEPARTMENT_OTHER): Payer: Medicare Other

## 2016-11-11 DIAGNOSIS — D630 Anemia in neoplastic disease: Secondary | ICD-10-CM

## 2016-11-11 DIAGNOSIS — I82401 Acute embolism and thrombosis of unspecified deep veins of right lower extremity: Secondary | ICD-10-CM | POA: Diagnosis not present

## 2016-11-11 DIAGNOSIS — C250 Malignant neoplasm of head of pancreas: Secondary | ICD-10-CM

## 2016-11-11 DIAGNOSIS — I82411 Acute embolism and thrombosis of right femoral vein: Secondary | ICD-10-CM | POA: Diagnosis present

## 2016-11-11 DIAGNOSIS — Z5111 Encounter for antineoplastic chemotherapy: Secondary | ICD-10-CM

## 2016-11-11 DIAGNOSIS — D473 Essential (hemorrhagic) thrombocythemia: Secondary | ICD-10-CM | POA: Diagnosis not present

## 2016-11-11 DIAGNOSIS — D75839 Thrombocytosis, unspecified: Secondary | ICD-10-CM

## 2016-11-11 DIAGNOSIS — N183 Chronic kidney disease, stage 3 (moderate): Secondary | ICD-10-CM

## 2016-11-11 LAB — COMPREHENSIVE METABOLIC PANEL
ALT: 8 U/L (ref 0–55)
ANION GAP: 8 meq/L (ref 3–11)
AST: 12 U/L (ref 5–34)
Albumin: 2.6 g/dL — ABNORMAL LOW (ref 3.5–5.0)
Alkaline Phosphatase: 101 U/L (ref 40–150)
BUN: 45.8 mg/dL — ABNORMAL HIGH (ref 7.0–26.0)
CHLORIDE: 111 meq/L — AB (ref 98–109)
CO2: 18 meq/L — AB (ref 22–29)
Calcium: 9.4 mg/dL (ref 8.4–10.4)
Creatinine: 2.9 mg/dL — ABNORMAL HIGH (ref 0.7–1.3)
EGFR: 24 mL/min/{1.73_m2} — AB (ref >60)
Glucose: 115 mg/dl (ref 70–140)
POTASSIUM: 3.7 meq/L (ref 3.5–5.1)
Sodium: 137 mEq/L (ref 136–145)
Total Bilirubin: <0.22 mg/dL (ref 0.20–1.20)
Total Protein: 7.8 g/dL (ref 6.4–8.3)

## 2016-11-11 LAB — CBC WITH DIFFERENTIAL/PLATELET
BASO%: 0.5 % (ref 0.0–2.0)
Basophils Absolute: 0 10*3/uL (ref 0.0–0.1)
EOS ABS: 0.4 10*3/uL (ref 0.0–0.5)
EOS%: 4.9 % (ref 0.0–7.0)
HCT: 27.3 % — ABNORMAL LOW (ref 38.4–49.9)
HGB: 8.6 g/dL — ABNORMAL LOW (ref 13.0–17.1)
LYMPH%: 32.5 % (ref 14.0–49.0)
MCH: 27.5 pg (ref 27.2–33.4)
MCHC: 31.5 g/dL — AB (ref 32.0–36.0)
MCV: 87.2 fL (ref 79.3–98.0)
MONO#: 0.8 10*3/uL (ref 0.1–0.9)
MONO%: 10.8 % (ref 0.0–14.0)
NEUT#: 3.9 10*3/uL (ref 1.5–6.5)
NEUT%: 51.3 % (ref 39.0–75.0)
PLATELETS: 402 10*3/uL — AB (ref 140–400)
RBC: 3.13 10*6/uL — AB (ref 4.20–5.82)
RDW: 17.8 % — ABNORMAL HIGH (ref 11.0–14.6)
WBC: 7.6 10*3/uL (ref 4.0–10.3)
lymph#: 2.5 10*3/uL (ref 0.9–3.3)

## 2016-11-11 LAB — PROTIME-INR
INR: 1.3 — ABNORMAL LOW (ref 2.00–3.50)
Protime: 15.6 Seconds — ABNORMAL HIGH (ref 10.6–13.4)

## 2016-11-11 MED ORDER — SODIUM CHLORIDE 0.9 % IV SOLN
Freq: Once | INTRAVENOUS | Status: AC
  Administered 2016-11-11: 10:00:00 via INTRAVENOUS

## 2016-11-11 MED ORDER — SODIUM CHLORIDE 0.9 % IV SOLN
800.0000 mg/m2 | Freq: Once | INTRAVENOUS | Status: AC
  Administered 2016-11-11: 1672 mg via INTRAVENOUS
  Filled 2016-11-11: qty 43.97

## 2016-11-11 MED ORDER — PROCHLORPERAZINE MALEATE 10 MG PO TABS
ORAL_TABLET | ORAL | Status: AC
Start: 2016-11-11 — End: 2016-11-11
  Filled 2016-11-11: qty 1

## 2016-11-11 MED ORDER — PROCHLORPERAZINE MALEATE 10 MG PO TABS
10.0000 mg | ORAL_TABLET | Freq: Once | ORAL | Status: AC
  Administered 2016-11-11: 10 mg via ORAL

## 2016-11-11 NOTE — Patient Instructions (Signed)
Your nurse will teach you how to give lovenox injection. You will get your first dose before you leave [place] today. Inject 90 mg daily. Increase coumadin to 7.5 mg (1 and 1/2 pills) daily. Do not take these medications on Monday, we will recheck your lab and give you further instructions. If you have bleeding, hold the dose and call [place] [phone].

## 2016-11-11 NOTE — Progress Notes (Signed)
Per Regan Rakers, NP, OK to treat with abnormal lab values today. Instructed patient to hydrate well at home. Patient voiced understanding.   Wylene Simmer, BSN, RN 11/11/2016 11:18 AM

## 2016-11-11 NOTE — Patient Instructions (Addendum)
[facility] Discharge Instructions for Patients Receiving Chemotherapy  Today you received the following chemotherapy agents: Gemzar  To help prevent nausea and vomiting after your treatment, we encourage you to take your nausea medication as directed If you develop nausea and vomiting that is not controlled by your nausea medication, call the clinic.   BELOW ARE SYMPTOMS THAT SHOULD BE REPORTED IMMEDIATELY:  *FEVER GREATER THAN 100.5 F  *CHILLS WITH OR WITHOUT FEVER  NAUSEA AND VOMITING THAT IS NOT CONTROLLED WITH YOUR NAUSEA MEDICATION  *UNUSUAL SHORTNESS OF BREATH  *UNUSUAL BRUISING OR BLEEDING  TENDERNESS IN MOUTH AND THROAT WITH OR WITHOUT PRESENCE OF ULCERS  *URINARY PROBLEMS  *BOWEL PROBLEMS  UNUSUAL RASH Items with * indicate a potential emergency and should be followed up as soon as possible.  Feel free to call the clinic should you have any questions or concerns. The clinic phone number is [phone].  Please show the [place] at check-in to the Emergency Department and triage nurse.   Enoxaparin injection What is this medicine? ENOXAPARIN (ee nox a PA rin) is used after knee, hip, or abdominal surgeries to prevent blood clotting. It is also used to treat existing blood clots in the lungs or in the veins. This medicine may be used for other purposes; ask your health care provider or pharmacist if you have questions. COMMON BRAND NAME(S): Lovenox What should I tell my health care provider before I take this medicine? They need to know if you have any of these conditions: -bleeding disorders, hemorrhage, or hemophilia -infection of the heart or heart valves -kidney or liver disease -previous stroke -prosthetic heart valve -recent surgery or delivery of a baby -ulcer in the stomach or intestine, diverticulitis, or other bowel disease -an unusual or allergic reaction to enoxaparin, heparin, pork or pork products, other medicines, foods,  dyes, or preservatives -pregnant or trying to get pregnant -breast-feeding How should I use this medicine? This medicine is for injection under the skin. It is usually given by a health-care professional. You or a family member may be trained on how to give the injections. If you are to give yourself injections, make sure you understand how to use the syringe, measure the dose if necessary, and give the injection. To avoid bruising, do not rub the site where this medicine has been injected. Do not take your medicine more often than directed. Do not stop taking except on the advice of your doctor or health care professional. Make sure you receive a puncture-resistant container to dispose of the needles and syringes once you have finished with them. Do not reuse these items. Return the container to your doctor or health care professional for proper disposal. Talk to your pediatrician regarding the use of this medicine in children. Special care may be needed. Overdosage: If you think you have taken too much of this medicine contact a poison control center or emergency room at once. NOTE: This medicine is only for you. Do not share this medicine with others. What if I miss a dose? If you miss a dose, take it as soon as you can. If it is almost time for your next dose, take only that dose. Do not take double or extra doses. What may interact with this medicine? -aspirin and aspirin-like medicines -certain medicines that treat or prevent blood clots -dipyridamole -NSAIDs, medicines for pain and inflammation, like ibuprofen or naproxen This list may not describe all possible interactions. Give your health care provider a list  of all the medicines, herbs, non-prescription drugs, or dietary supplements you use. Also tell them if you smoke, drink alcohol, or use illegal drugs. Some items may interact with your medicine. What should I watch for while using this medicine? Visit your doctor or health care  professional for regular checks on your progress. Your condition will be monitored carefully while you are receiving this medicine. Notify your doctor or health care professional and seek emergency treatment if you develop breathing problems; changes in vision; chest pain; severe, sudden headache; pain, swelling, warmth in the leg; trouble speaking; sudden numbness or weakness of the face, arm, or leg. These can be signs that your condition has gotten worse. If you are going to have surgery, tell your doctor or health care professional that you are taking this medicine. Do not stop taking this medicine without first talking to your doctor. Be sure to refill your prescription before you run out of medicine. Avoid sports and activities that might cause injury while you are using this medicine. Severe falls or injuries can cause unseen bleeding. Be careful when using sharp tools or knives. Consider using an Copy. Take special care brushing or flossing your teeth. Report any injuries, bruising, or red spots on the skin to your doctor or health care professional. What side effects may I notice from receiving this medicine? Side effects that you should report to your doctor or health care professional as soon as possible: -allergic reactions like skin rash, itching or hives, swelling of the face, lips, or tongue -feeling faint or lightheaded, falls -signs and symptoms of bleeding such as bloody or black, tarry stools; red or dark-brown urine; spitting up blood or brown material that looks like coffee grounds; red spots on the skin; unusual bruising or bleeding from the eye, gums, or nose Side effects that usually do not require medical attention (report to your doctor or health care professional if they continue or are bothersome): -pain, redness, or irritation at site where injected This list may not describe all possible side effects. Call your doctor for medical advice about side effects. You may  report side effects to FDA at 1-800-FDA-1088. Where should I keep my medicine? Keep out of the reach of children. Store at room temperature between 15 and 30 degrees C (59 and 86 degrees F). Do not freeze. If your injections have been specially prepared, you may need to store them in the refrigerator. Ask your pharmacist. Throw away any unused medicine after the expiration date. NOTE: This sheet is a summary. It may not cover all possible information. If you have questions about this medicine, talk to your doctor, pharmacist, or health care provider.  2018 Elsevier/Gold Standard (2013-05-03 16:06:21)

## 2016-11-11 NOTE — Progress Notes (Addendum)
[place]  [phone] [phone]) [phone]  Clinic Follow up Note   Patient Care Team: Patient, No Pcp Per as PCP - General (General Practice) 11/11/2016  SUMMARY OF ONCOLOGIC HISTORY: Oncology History   Cancer Staging Pancreatic cancer [facility]) Staging form: Exocrine Pancreas, AJCC 8th Edition - Clinical: Stage IA (cT1c, cN0, cM0) - Signed by Truitt Merle, MD on 09/14/2016       Malignant neoplasm of prostate ([place])   06/27/2016 Initial Biopsy    Diagnosis 06/27/16 1. Urethra, biopsy, bulb KERATINIZED SQUAMOUS CELL METAPLASIA AND HYPERPLASIA 2. Ureter, biopsy, right orifice METASTATIC PROSTATIC ADENOCARCINOMA, GLEASON SCORE 5+5=10 INVOLVES 50% OF THE SPECIMEN 3. Urethra, biopsy, prostatic polyp PROSTATIC ADENOCARCINOMA, GLEASON SCORE 5+4=9 INVOLVES 30% OF THE RESECTED TISSUE 4. Bladder, biopsy, posterior METASTATIC PROSTATIC ADENOCARCINOMA, INVOLVING DETRUSOR MUSCLE BENIGN UROTHELIUM WITH POLYPOID CYSTITIS 5. Prostate, chips PROSTATIC ADENOCARCINOMA, GLEASON SCORE 5+5=10 INVOLVES 80% OF THE RESECTED TISSUE  ADDENDUM: Immunohistochemistry is attempted on the cell block and there is limited tumor present. The scant tumor present is negative with prostein and prostate specific antigen. Called to Dr. Burr Medico and Dr. Ardis Hughs on 09/17/16. (JDP:gt, 09/17/16)      07/25/2016 Initial Diagnosis    Malignant neoplasm of prostate ([place])     09/19/2016 Imaging    CT Chest IMPRESSION: 1. Several small solid pulmonary nodules scattered in both lungs, largest 6 mm, indeterminate but more likely benign given predominantly subpleural location of the nodules. Recommend initial follow-up chest CT in 3 months. 2. No thoracic adenopathy or other potential findings of metastatic disease in the chest. 3. Right upper lobe 1.1 cm ground-glass pulmonary nodule. Initial follow-up with CT at 6-12 months is recommended to confirm persistence. If persistent, repeat CT is recommended  every 2 years until 5 years of stability has been established. This recommendation follows the consensus statement: Guidelines for Management of Incidental Pulmonary Nodules Detected on CT Images: From the Fleischner Society 2017; Radiology 2017; 284:228-243. 4. Mild cardiomegaly. Three-vessel coronary atherosclerosis status post CABG. 5. Multinodular goiter with dominant 3.7 cm right thyroid lobe nodule, for which thyroid ultrasound correlation is warranted, which may be performed if clinically warranted. 6. Re- demonstration of diffuse pancreatic duct dilation due to known pancreatic malignancy.        Pancreatic cancer ([place])   07/08/2016 Imaging    NM Bone Scan Whole Body 07/08/16 IMPRESSION: There are no findings suspicious for metastatic disease to the skeleton.      07/28/2016 Imaging    MR Abd w/wo contrast 07/28/16 IMPRESSION: 1. There is a suspicious lesion involving the head of pancreas and obstructing the pancreatic duct is identified. Involvement of the portal vein and portal venous confluence is suspected. Findings worrisome for pancreatic adenocarcinoma. Further investigation with endoscopic ultrasound and tissue sampling advise. 2. Resolution of bilateral hydronephrosis. Bilateral kidney cysts noted. 3. Aortic atherosclerosis.      08/28/2016 Procedure    EUS by Dr. Ardis Hughs 08/28/16 IMPRESSION:  1.9cm by 1.5cm mass in the head of pancreas causing main pancreatic duct obstruction and directly abutting the portal vein (suggesting invasion). The mass is not obstructing the bile duct. Preliminary reading of FNA shows "very atypical cells."        08/29/2016 Initial Biopsy    Diagnosis 08/29/16 FINE NEEDLE ASPIRATION, ENDOSCOPIC, PANCREAS HEAD(SPECIMEN 1 OF 1 COLLECTED 08/28/16): POORLY DIFFERENTIATED CARCINOMA ASSOCIATED WITH ACUTE INFLAMMATION. SEE COMMENT. Comment: There are a few cells with features suggestive of adenocarcinoma.      09/12/2016 Initial  Diagnosis  Pancreatic cancer ([place])     09/19/2016 Imaging    CT Chest WO Contrast 09/19/16 IMPRESSION: 1. Several small solid pulmonary nodules scattered in both lungs, largest 6 mm, indeterminate but more likely benign given predominantly subpleural location of the nodules. Recommend initial follow-up chest CT in 3 months. 2. No thoracic adenopathy or other potential findings of metastatic disease in the chest. 3. Right upper lobe 1.1 cm ground-glass pulmonary nodule. Initial follow-up with CT at 6-12 months is recommended to confirm persistence. If persistent, repeat CT is recommended every 2 years until 5 years of stability has been established. This recommendation follows the consensus statement: Guidelines for Management of Incidental Pulmonary Nodules Detected on CT Images: From the Fleischner Society 2017; Radiology 2017; 284:228-243. 4. Mild cardiomegaly. Three-vessel coronary atherosclerosis status post CABG. 5. Multinodular goiter with dominant 3.7 cm right thyroid lobe nodule, for which thyroid ultrasound correlation is warranted, which may be performed if clinically warranted. 6. Re- demonstration of diffuse pancreatic duct dilation due to known pancreatic malignancy.      09/26/2016 -  Chemotherapy    neoadjuvant Gemcitabine and Abraxane on day 1, 8 every 3 weeks started 09/26/16, will hold Abraxane for first 1-2 doses, reduced gemcitabine dosed to 800 mg/m      CURRENT THERAPY: neoadjuvant Gemcitabine and Abraxane on day 1, 8 every 21 days, Gemcitabine reduced dose to 800 mg/m^2, Held Abraxane for first 2 doses started 09/26/16  INTERVAL HISTORY: [name] is seen today for follow-up prior to cycle 2 gemcitabine. He was delayed one week to allow more time for recovery following cycle 1 day 16 gemcitabine/Abraxane that he tolerated poorly on 10/14/2016. Today his fatigue is much improved, appetite has returned, no nausea/vomiting. Doppler study on 11/05/2016 revealed acute  DVT, he saw Dr. Burr Medico immediately after and was started on 5 mg Coumadin daily, he has not missed any doses.  REVIEW OF SYSTEMS:   Constitutional: Denies fevers, chills or abnormal weight loss (+) mild fatigue, improving Eyes: Denies blurriness of vision Ears, nose, mouth, throat, and face: Denies mucositis or sore throat Respiratory: Denies cough, dyspnea or wheezes Cardiovascular: Denies palpitation, chest discomfort (+) lower extremity swelling Gastrointestinal:  Denies nausea, vomiting, constipation, diarrhea, heartburn or change in bowel habits. Denies abdominal pain Skin: Denies abnormal skin rashes Lymphatics: Denies new lymphadenopathy, easy bruising, or bleeding Neurological:Denies numbness, tingling or new weaknesses Behavioral/Psych: Mood is stable, no new changes  All other systems were reviewed with the patient and are negative.  MEDICAL HISTORY:  Past Medical History:  Diagnosis Date  . Acute MI, inferior wall ([place]) 1966  . Amputation finger    right first finger top portion age 89  . CHF (congestive heart failure) ([place])   . Coronary artery disease    severe 3 vessel   . Fall   . Foley catheter in place   . H/O hyperkalemia   . H/O thrombocytosis   . Hip fracture, left ([place])    11/2014  . History of blood transfusion   . History of metabolic acidosis   . History of tobacco abuse   . Hypertension   . Obstructive uropathy   . Pneumonia    childhood   . Prostate cancer ([place])   . S/P VSD repair    09/2009  . Trichomonas infection   . Urethral stricture due to infective diseases classified elsewhere   . Urinary tract infection     SURGICAL HISTORY: Past Surgical History:  Procedure Laterality Date  . CORONARY ARTERY BYPASS  GRAFT     times 4  . CYSTOSCOPY W/ RETROGRADES N/A 04/03/2015   Procedure:  BLADDER BIOPSIES;  Surgeon: Festus Aloe, MD;  Location: [facility];  Service: Urology;  Laterality: N/A;  . CYSTOSCOPY W/ RETROGRADES Bilateral 06/27/2016    Procedure: CYSTOSCOPY WITH BILATERAL RETROGRADES;  Surgeon: Festus Aloe, MD;  Location: [facility];  Service: Urology;  Laterality: Bilateral;  . CYSTOSCOPY WITH BIOPSY N/A 06/27/2016   Procedure: CYSTOSCOPY WITH BLADDER BIOPSY URETHRAL BIOPSY;  Surgeon: Festus Aloe, MD;  Location: [facility];  Service: Urology;  Laterality: N/A;  . CYSTOSCOPY WITH URETHRAL DILATATION N/A 04/03/2015   Procedure: CYSTOSCOPY WITH BILATERAL RETROGRADES;  Surgeon: Festus Aloe, MD;  Location: [facility];  Service: Urology;  Laterality: N/A;  . EUS N/A 08/28/2016   Procedure: UPPER ENDOSCOPIC ULTRASOUND (EUS) RADIAL;  Surgeon: Milus Banister, MD;  Location: [facility];  Service: Endoscopy;  Laterality: N/A;  . flexible cystoscopy    . repair of post infarction posterior ventricular septal defect    . TRANSURETHRAL RESECTION OF PROSTATE  06/27/2016   Procedure: TRANSURETHRAL RESECTION OF THE PROSTATE (TURP);  Surgeon: Festus Aloe, MD;  Location: [facility];  Service: Urology;;    I have reviewed the social history and family history with the patient and they are unchanged from previous note.  ALLERGIES:  is allergic to chlorhexidine gluconate.  MEDICATIONS:  Current Outpatient Prescriptions  Medication Sig Dispense Refill  . amLODipine (NORVASC) 10 MG tablet take 1/2 tablet by mouth once daily 30 tablet 11  . carvedilol (COREG) 12.5 MG tablet take 1 tablet by mouth twice a day with meals **KEEP OFFICE VISIT** 30 tablet 10  . ondansetron (ZOFRAN) 8 MG tablet Take 1 tablet (8 mg total) by mouth 2 (two) times daily as needed (Nausea or vomiting). 30 tablet 1  . prochlorperazine (COMPAZINE) 10 MG tablet Take 1 tablet (10 mg total) by mouth every 6 (six) hours as needed (Nausea or vomiting). 30 tablet 1  . rosuvastatin (CRESTOR) 40 MG tablet take 1 tablet by mouth once daily **KEEP OFFICE VISIT** 30 tablet 10  . warfarin (COUMADIN) 5 MG tablet Take 1 tablet (5 mg total) by mouth daily. 30 tablet 0   No current  facility-administered medications for this visit.    Facility-Administered Medications Ordered in Other Visits  Medication Dose Route Frequency Provider Last Rate Last Dose  . ceFAZolin (ANCEF) IVPB 2g/100 mL premix  2 g Intravenous 30 min Pre-Op Festus Aloe, MD      . gemcitabine (GEMZAR) 1,672 mg in sodium chloride 0.9 % 250 mL chemo infusion  800 mg/m2 (Treatment Plan Recorded) Intravenous Once Truitt Merle, MD        PHYSICAL EXAMINATION: ECOG PERFORMANCE STATUS: 1 - Symptomatic but completely ambulatory There were no vitals taken for this visit.  GENERAL:alert, no distress and comfortable SKIN: skin color, texture, turgor are normal, no rashes or significant lesions EYES: normal, Conjunctiva are pink and non-injected, sclera clear OROPHARYNX:no exudate, no erythema and lips, buccal mucosa, and tongue normal  NECK: supple, thyroid normal size, non-tender, without nodularity LYMPH:  no palpable cervical lymphadenopathy LUNGS: clear to auscultation bilaterally with normal breathing effort HEART: regular rate & rhythm and no murmurs (+) trace lower extremity edema ABDOMEN:abdomen soft, non-tender and normal bowel sounds Musculoskeletal:no cyanosis of digits and no clubbing  NEURO: alert & oriented x 3 with fluent speech, no focal motor/sensory deficits  LABORATORY DATA:  I have reviewed the data as listed CBC Latest Ref Rng & Units 11/11/2016  11/04/2016 10/14/2016  WBC 4.0 - 10.3 10e3/uL 7.6 8.1 4.0  Hemoglobin 13.0 - 17.1 g/dL 8.6(L) 9.1(L) 9.9(L)  Hematocrit 38.4 - 49.9 % 27.3(L) 28.1(L) 30.3(L)  Platelets 140 - 400 10e3/uL 402(H) 699(H) 318     CMP Latest Ref Rng & Units 11/11/2016 11/04/2016 10/14/2016  Glucose 70 - 140 mg/dl 115 97 101  BUN 7.0 - 26.0 mg/dL 45.8(H) 28.6(H) 42.0(H)  Creatinine 0.7 - 1.3 mg/dL 2.9(H) 2.5(H) 2.6(H)  Sodium 136 - 145 mEq/L 137 139 137  Potassium 3.5 - 5.1 mEq/L 3.7 4.3 4.1  Chloride 101 - 111 mmol/L - - -  CO2 22 - 29 mEq/L 18(L) 20(L)  14(L)  Calcium 8.4 - 10.4 mg/dL 9.4 9.6 10.0  Total Protein 6.4 - 8.3 g/dL 7.8 7.7 8.2  Total Bilirubin 0.20 - 1.20 mg/dL <0.22 <0.22 <0.22  Alkaline Phos 40 - 150 U/L 101 106 90  AST 5 - 34 U/L 12 16 16   ALT 0 - 55 U/L 8 8 12      RADIOGRAPHIC STUDIES: I have personally reviewed the radiological images as listed and agreed with the findings in the report. No results found.   ASSESSMENT & PLAN: 71 y.o.African-American male, with past medical history of CAD, and recently diagnosed prostate cancer, gleason score 10, presented with incidental imaging finding of a pancreatic head mass.  1. Carcinoma of the head of the pancreas, cT1cN0M0,stage IA 2. CHF, HTN, CAD status post CABG 4 3. Newly diagnosed prostate cancer, Gleason score 10 4. CK D, stage III 5. Thyroid nodule 6. Lung nodules 7. Lower extremity edema, right greater than left 8. Acute deep vein thrombosis in the right proximal-mid peroneal veins; superficial vein thrombosis noted in left greater saphenous vein, left femoral artery nearly occluded  [name] appears stable today, ready to proceed with cycle 2 day 1 chemotherapy with gemcitabine only. Physical exam is normal. Labs indicate stable renal function. He is anemic with Hgb 8.6 and thrombocytopenic with plt 402 on CBC today. Thrombocytopenia may be reactive from his anemia. Will obtain iron studies at next lab visit. He does not require blood transfusion today. PT/INR 1.3 today indicates he is not in therapeutic range of 2-3 on coumadin. Per discussion with Dr. Burr Medico and pharmacy considering his renal function, he will increase coumadin to 7.5 daily and bridge with lovenox 90 mg daily (today)Tuesday through Sunday. He will hold Monday dose until lab check 11/5, will provide further instructions at that time. He will receive injection prior to leaving infusion room today, RN to do injection teaching. I instructed him to hold dose and call with any new bleeding. Will continue to  monitor him closely. He missed an echo this morning at [facility] cardiology, he will call to reschedule. He is working with social work to get a Engineer, water. He will return for cycle 2 day 8 on 11/5, restaging scans on 11/13, and f/u on 11/14 after tumor board review.   Plan: -labs reviewed, proceed with cycle 2 gemcitabine only -INR 1.3 today not in therapeutic range: increase coumadin to 7.5 mg daily, begin lovenox inj 90 mg daily today through Sunday; hold Monday dose until further instruction -return for lab and C2D8 gemzar only 11/5   Orders Placed This Encounter  Procedures  . Ferritin    Standing Status:   Future    Standing Expiration Date:   11/11/2017  . Iron and TIBC    Standing Status:   Future    Standing Expiration Date:  11/11/2017  . CBC with Differential    Standing Status:   Standing    Number of Occurrences:   100    Standing Expiration Date:   11/11/2017  . Comprehensive metabolic panel    Standing Status:   Standing    Number of Occurrences:   100    Standing Expiration Date:   11/11/2017   All questions were answered. The patient knows to call the clinic with any problems, questions or concerns. No barriers to learning was detected. I spent 25 minutes counseling the patient face to face. The total time spent in the appointment was 30 minutes and more than 50% was on counseling and review of test results     Alla Feeling, NP 11/11/16

## 2016-11-17 ENCOUNTER — Ambulatory Visit: Payer: Self-pay

## 2016-11-17 ENCOUNTER — Other Ambulatory Visit: Payer: Self-pay | Admitting: Nurse Practitioner

## 2016-11-17 ENCOUNTER — Encounter: Payer: Self-pay | Admitting: Nurse Practitioner

## 2016-11-17 ENCOUNTER — Other Ambulatory Visit (HOSPITAL_BASED_OUTPATIENT_CLINIC_OR_DEPARTMENT_OTHER): Payer: Medicare Other

## 2016-11-17 ENCOUNTER — Ambulatory Visit: Payer: Medicare Other

## 2016-11-17 DIAGNOSIS — I82401 Acute embolism and thrombosis of unspecified deep veins of right lower extremity: Secondary | ICD-10-CM

## 2016-11-17 DIAGNOSIS — C250 Malignant neoplasm of head of pancreas: Secondary | ICD-10-CM | POA: Diagnosis present

## 2016-11-17 LAB — CBC WITH DIFFERENTIAL/PLATELET
BASO%: 0.3 % (ref 0.0–2.0)
Basophils Absolute: 0 10*3/uL (ref 0.0–0.1)
EOS%: 2.3 % (ref 0.0–7.0)
Eosinophils Absolute: 0.2 10*3/uL (ref 0.0–0.5)
HEMATOCRIT: 26.8 % — AB (ref 38.4–49.9)
HEMOGLOBIN: 8.5 g/dL — AB (ref 13.0–17.1)
LYMPH#: 2.1 10*3/uL (ref 0.9–3.3)
LYMPH%: 26.5 % (ref 14.0–49.0)
MCH: 27.2 pg (ref 27.2–33.4)
MCHC: 31.7 g/dL — ABNORMAL LOW (ref 32.0–36.0)
MCV: 85.6 fL (ref 79.3–98.0)
MONO#: 0.6 10*3/uL (ref 0.1–0.9)
MONO%: 7 % (ref 0.0–14.0)
NEUT%: 63.9 % (ref 39.0–75.0)
NEUTROS ABS: 5 10*3/uL (ref 1.5–6.5)
NRBC: 0 % (ref 0–0)
Platelets: 310 10*3/uL (ref 140–400)
RBC: 3.13 10*6/uL — ABNORMAL LOW (ref 4.20–5.82)
RDW: 16.9 % — AB (ref 11.0–14.6)
WBC: 7.8 10*3/uL (ref 4.0–10.3)

## 2016-11-17 LAB — COMPREHENSIVE METABOLIC PANEL
ALBUMIN: 2.7 g/dL — AB (ref 3.5–5.0)
ALK PHOS: 97 U/L (ref 40–150)
ALT: 33 U/L (ref 0–55)
AST: 22 U/L (ref 5–34)
Anion Gap: 8 mEq/L (ref 3–11)
BUN: 40 mg/dL — ABNORMAL HIGH (ref 7.0–26.0)
CO2: 19 mEq/L — ABNORMAL LOW (ref 22–29)
Calcium: 9.9 mg/dL (ref 8.4–10.4)
Chloride: 106 mEq/L (ref 98–109)
Creatinine: 2.9 mg/dL — ABNORMAL HIGH (ref 0.7–1.3)
EGFR: 24 mL/min/{1.73_m2} — AB (ref >60)
GLUCOSE: 152 mg/dL — AB (ref 70–140)
POTASSIUM: 4.1 meq/L (ref 3.5–5.1)
SODIUM: 133 meq/L — AB (ref 136–145)
Total Bilirubin: 0.34 mg/dL (ref 0.20–1.20)
Total Protein: 8.9 g/dL — ABNORMAL HIGH (ref 6.4–8.3)

## 2016-11-17 LAB — PROTIME-INR
INR: 1.3 — AB (ref 2.00–3.50)
PROTIME: 15.6 s — AB (ref 10.6–13.4)

## 2016-11-17 LAB — IRON AND TIBC
%SAT: 7 % — ABNORMAL LOW (ref 20–55)
Iron: 15 ug/dL — ABNORMAL LOW (ref 42–163)
TIBC: 215 ug/dL (ref 202–409)
UIBC: 200 ug/dL (ref 117–376)

## 2016-11-17 LAB — FERRITIN: Ferritin: 567 ng/ml — ABNORMAL HIGH (ref 22–316)

## 2016-11-17 MED ORDER — APIXABAN 5 MG PO TABS: 5.0000 mg | ORAL_TABLET | Freq: Two times a day (BID) | ORAL | 1 refills | Status: DC

## 2016-11-17 NOTE — Progress Notes (Signed)
Patient with acute DVT in right proximal-mid peroneal veins. Initially started on coumadin 5 mg daily on 11/05/16. PT/INR subtherapeutic 1.3 on 11/11/16. He was increased to 7.5 mg daily coumadin plus 90 mg lovenox inj daily. 1 week later today on recheck, he remains subtherapeutic at 1.3. Coumadin discontinued and new prescription for eliquis sent to pharmacy, will start 10 mg BID x7 days then 5 mg daily. I discussed these instructions with patient in great detail. He verbalizes instructions, written information provided. Schedule message sent for return in 4 days for labs. He is unable to stay for chemo today, he has childcare obligations. Per scheduling we are capped in infusion all week. He will forego his treatment today, will see if we can reschedule him this week.

## 2016-11-17 NOTE — Progress Notes (Signed)
Patient with acute DVT in right proximal-mid peroneal veins. Initially started on coumadin 5 mg daily on 11/05/16. PT/INR subtherapeutic 1.3 on 11/11/16. He was increased to 7.5 mg daily coumadin plus 90 mg lovenox inj daily. 1 week later, today on recheck he remains subtherapeutic at 1.3. RLE edema has improved. Discussed with Dr. Burr Medico; coumadin and lovenox discontinued and new prescription for eliquis sent to pharmacy, will start 10 mg BID x7 days then 5 mg daily. I discussed these instructions with patient in great detail in the lobby. He verbalizes understanding of instructions, written information provided to him. Schedule message sent for return in 4 days for labs. He is unable to stay for chemo today, he has childcare obligations. Per scheduling we are capped in infusion all week. He will forego his treatment today, will see if we can reschedule him this week. Will f/u with patient in a couple days to assess compliance with new medication and status of his schedule.

## 2016-11-18 DIAGNOSIS — C61 Malignant neoplasm of prostate: Secondary | ICD-10-CM | POA: Diagnosis not present

## 2016-11-18 DIAGNOSIS — R351 Nocturia: Secondary | ICD-10-CM | POA: Diagnosis not present

## 2016-11-18 DIAGNOSIS — R8290 Unspecified abnormal findings in urine: Secondary | ICD-10-CM | POA: Diagnosis not present

## 2016-11-18 NOTE — Progress Notes (Signed)
  Oncology Nurse Navigator Documentation  Navigator Location: [facility]- (11/18/16 3358) Referral date to RadOnc/MedOnc: 07/30/16 (11/18/16 2518) )Navigator Encounter Type: Telephone (11/18/16 0946)   Abnormal Finding Date: 07/28/16 (11/18/16 0946) Confirmed Diagnosis Date: 08/28/16 (11/18/16 0946)             Treatment Initiated Date: 09/26/16 (11/18/16 0946)     Barriers/Navigation Needs: Transportation;Education (11/18/16 0946) Education: Understanding Cancer/ Treatment Options (11/18/16 0946) Interventions: Transportation;Coordination of Care (11/18/16 0946)  I was able to reach patient by phone to assess barriers to care and adherence to treatment plan. Patient shared that he has not yet picked up his prescription for Eliquis. I explained the importance of picking up the prescription and taking it as prescribed. I also reviewed the patient's appointments for his CT scan and MRI on 11/25/16. He stated that he understood when the appointments are and that he would "get there". I also let him know that someone from the cancer center would be calling to reschedule his chemo therapy appointment. Patient verbalized understanding and teach back utilized,          Acuity: Level 2 (11/18/16 0946)         Time Spent with Patient: 30 (11/18/16 0946)

## 2016-11-25 ENCOUNTER — Encounter (HOSPITAL_COMMUNITY): Payer: Self-pay

## 2016-11-25 ENCOUNTER — Ambulatory Visit (HOSPITAL_COMMUNITY)
Admission: RE | Admit: 2016-11-25 | Discharge: 2016-11-25 | Disposition: A | Discharge status: Home / Self Care | Payer: Medicare Other | Source: Ambulatory Visit | Attending: Nurse Practitioner | Admitting: Nurse Practitioner

## 2016-11-25 ENCOUNTER — Other Ambulatory Visit: Payer: Self-pay | Admitting: Nurse Practitioner

## 2016-11-25 ENCOUNTER — Telehealth: Payer: Self-pay | Admitting: Hematology

## 2016-11-25 DIAGNOSIS — R911 Solitary pulmonary nodule: Secondary | ICD-10-CM | POA: Insufficient documentation

## 2016-11-25 DIAGNOSIS — C250 Malignant neoplasm of head of pancreas: Secondary | ICD-10-CM | POA: Diagnosis not present

## 2016-11-25 DIAGNOSIS — I700 Atherosclerosis of aorta: Secondary | ICD-10-CM | POA: Insufficient documentation

## 2016-11-25 DIAGNOSIS — R9389 Abnormal findings on diagnostic imaging of other specified body structures: Secondary | ICD-10-CM | POA: Diagnosis not present

## 2016-11-25 DIAGNOSIS — K869 Disease of pancreas, unspecified: Secondary | ICD-10-CM | POA: Diagnosis not present

## 2016-11-25 DIAGNOSIS — J439 Emphysema, unspecified: Secondary | ICD-10-CM | POA: Insufficient documentation

## 2016-11-25 DIAGNOSIS — R918 Other nonspecific abnormal finding of lung field: Secondary | ICD-10-CM | POA: Diagnosis not present

## 2016-11-25 NOTE — Telephone Encounter (Signed)
Spoke with patient regarding appts added per 11/13 sch msg.

## 2016-11-26 ENCOUNTER — Encounter: Payer: Self-pay | Admitting: Hematology

## 2016-11-26 ENCOUNTER — Other Ambulatory Visit: Payer: Self-pay

## 2016-11-26 ENCOUNTER — Ambulatory Visit
Admission: RE | Admit: 2016-11-26 | Discharge: 2016-11-26 | Disposition: A | Discharge status: Home / Self Care | Payer: Medicare Other | Source: Ambulatory Visit | Attending: Radiation Oncology | Admitting: Radiation Oncology

## 2016-11-26 ENCOUNTER — Ambulatory Visit (HOSPITAL_BASED_OUTPATIENT_CLINIC_OR_DEPARTMENT_OTHER): Payer: Medicare Other | Admitting: Hematology

## 2016-11-26 ENCOUNTER — Other Ambulatory Visit (HOSPITAL_BASED_OUTPATIENT_CLINIC_OR_DEPARTMENT_OTHER): Payer: Medicare Other

## 2016-11-26 ENCOUNTER — Encounter: Payer: Self-pay | Admitting: Radiation Oncology

## 2016-11-26 VITALS — BP 156/90 | HR 71 | Temp 98.6°F | Resp 18

## 2016-11-26 VITALS — BP 159/98 | HR 66 | Temp 97.9°F | Resp 20 | Ht 66.0 in | Wt 204.0 lb

## 2016-11-26 DIAGNOSIS — C61 Malignant neoplasm of prostate: Secondary | ICD-10-CM | POA: Insufficient documentation

## 2016-11-26 DIAGNOSIS — E440 Moderate protein-calorie malnutrition: Secondary | ICD-10-CM | POA: Diagnosis not present

## 2016-11-26 DIAGNOSIS — Z79818 Long term (current) use of other agents affecting estrogen receptors and estrogen levels: Secondary | ICD-10-CM | POA: Diagnosis not present

## 2016-11-26 DIAGNOSIS — D473 Essential (hemorrhagic) thrombocythemia: Secondary | ICD-10-CM | POA: Diagnosis not present

## 2016-11-26 DIAGNOSIS — I5022 Chronic systolic (congestive) heart failure: Secondary | ICD-10-CM | POA: Diagnosis not present

## 2016-11-26 DIAGNOSIS — I82411 Acute embolism and thrombosis of right femoral vein: Secondary | ICD-10-CM

## 2016-11-26 DIAGNOSIS — C250 Malignant neoplasm of head of pancreas: Secondary | ICD-10-CM

## 2016-11-26 DIAGNOSIS — N183 Chronic kidney disease, stage 3 (moderate): Secondary | ICD-10-CM

## 2016-11-26 DIAGNOSIS — D630 Anemia in neoplastic disease: Secondary | ICD-10-CM

## 2016-11-26 DIAGNOSIS — I82431 Acute embolism and thrombosis of right popliteal vein: Secondary | ICD-10-CM

## 2016-11-26 LAB — CBC WITH DIFFERENTIAL/PLATELET
BASO%: 0.6 % (ref 0.0–2.0)
Basophils Absolute: 0 10*3/uL (ref 0.0–0.1)
EOS%: 4.9 % (ref 0.0–7.0)
Eosinophils Absolute: 0.3 10*3/uL (ref 0.0–0.5)
HEMATOCRIT: 27.2 % — AB (ref 38.4–49.9)
HEMOGLOBIN: 8.8 g/dL — AB (ref 13.0–17.1)
LYMPH#: 2.2 10*3/uL (ref 0.9–3.3)
LYMPH%: 38.1 % (ref 14.0–49.0)
MCH: 28.2 pg (ref 27.2–33.4)
MCHC: 32.3 g/dL (ref 32.0–36.0)
MCV: 87.3 fL (ref 79.3–98.0)
MONO#: 0.9 10*3/uL (ref 0.1–0.9)
MONO%: 15.3 % — AB (ref 0.0–14.0)
NEUT%: 41.1 % (ref 39.0–75.0)
NEUTROS ABS: 2.4 10*3/uL (ref 1.5–6.5)
Platelets: 367 10*3/uL (ref 140–400)
RBC: 3.11 10*6/uL — ABNORMAL LOW (ref 4.20–5.82)
RDW: 18.8 % — AB (ref 11.0–14.6)
WBC: 5.8 10*3/uL (ref 4.0–10.3)

## 2016-11-26 LAB — COMPREHENSIVE METABOLIC PANEL
ALBUMIN: 2.6 g/dL — AB (ref 3.5–5.0)
ALK PHOS: 100 U/L (ref 40–150)
ALT: 16 U/L (ref 0–55)
AST: 19 U/L (ref 5–34)
Anion Gap: 7 mEq/L (ref 3–11)
BUN: 34.9 mg/dL — ABNORMAL HIGH (ref 7.0–26.0)
CALCIUM: 9.6 mg/dL (ref 8.4–10.4)
CO2: 20 mEq/L — ABNORMAL LOW (ref 22–29)
CREATININE: 2.3 mg/dL — AB (ref 0.7–1.3)
Chloride: 113 mEq/L — ABNORMAL HIGH (ref 98–109)
EGFR: 31 mL/min/{1.73_m2} — ABNORMAL LOW (ref >60)
GLUCOSE: 104 mg/dL (ref 70–140)
Potassium: 4 mEq/L (ref 3.5–5.1)
SODIUM: 140 meq/L (ref 136–145)
TOTAL PROTEIN: 7.8 g/dL (ref 6.4–8.3)

## 2016-11-26 LAB — PROTIME-INR
INR: 1.1 — AB (ref 2.00–3.50)
PROTIME: 13.2 s (ref 10.6–13.4)

## 2016-11-26 NOTE — Progress Notes (Signed)
Radiation Oncology         ([phone]) [phone] ________________________________   Radiation Oncology Follow Up New Visit:  Name: [name] MRN: [id number]  Date: 11/26/2016  DOB: 1945/08/20  FO:YDXAJOI, No Pcp Per  Raynelle Bring, MD   REFERRING PHYSICIAN: Raynelle Bring, MD  DIAGNOSIS: 71 y.o. gentleman with locally advanced adenocarcinoma of the prostate with a Gleason's score of 5+5 and a PSA of 17.5    ICD-10-CM   1. Malignant neoplasm of prostate ([place]) C61   2. Malignant neoplasm of head of pancreas ([place]) C25.0     HISTORY OF PRESENT ILLNESS:[name] is a 71 y.o. gentleman.  He is well established with [facility] due to BPH with urinary retention, previously followed by Dr. Janice Norrie and currently managed by Dr. Junious Silk.  He had a recurrent episode of AUR in 06/2016 and DRE at that time revealed some induration.  The patient proceeded with TURP with prostate biopsy on 06/27/2016.  Pathology returned Gleason 5+5 disease in all TUR chips with local invasion into the bladder detrusor muscle. PSA on 07/11/16 was 17.5.  He had CT and Bone scan on 07/08/16.  Bone scan was negative for any osseous metastatic disease.  CT scan showed a single pathologically enlarged pelvic lymph node as well as dilation of the main pancreatic duct without discrete pancreatic mass. He was planning to proceed with 8 weeks of radiotherapy following ADT administration. He received ADT in the summer and due to the findings on the CT within the pancreas, he was counseled on holding off on radiation until his pancreas issue had been clarified. An MRI abdomen on Monday 07/28/16 revealed a 2.8 x 2 cm mass within the head of the pancreas, obstructing pancreatic duct which measures 1.2 cm in diameter, the portal vein and portal venous confluence appears partially narrowed by the lesion as well.  No pathologically enlarged lymph nodes were identified. He met with GI and underwent EUS on 08/28/16 and a biopsy of his pancreatic  head which confirmed adenocarcinoma   He was considered borderline resectable, and proceeded with systemic therapy under the care of Dr. Burr Medico with Gemzar as a single agent because he did not stay for his Abraxane infusion.  Ultimately is what is chemotherapy due to symptoms of fatigue related to his infusion. He had repeat imaging of the abdomen with MRI yesterday which confirmed persistent 2.5 cm mass in the head of the pancreas, no peripancreatic inflammatory changes or fluid was noted, and no additional adenopathy was present.    PREVIOUS RADIATION THERAPY: No  PAST MEDICAL HISTORY: Past Medical History:  Diagnosis Date  . Acute MI, inferior wall ([place]) 1966  . Amputation finger    right first finger top portion age 22  . CHF (congestive heart failure) ([place])   . Coronary artery disease    severe 3 vessel   . Fall   . Foley catheter in place   . H/O hyperkalemia   . H/O thrombocytosis   . Hip fracture, left ([place])    11/2014  . History of blood transfusion   . History of metabolic acidosis   . History of tobacco abuse   . Hypertension   . Obstructive uropathy   . Pneumonia    childhood   . Prostate cancer ([place])   . S/P VSD repair    09/2009  . Trichomonas infection   . Urethral stricture due to infective diseases classified elsewhere   . Urinary tract infection  PAST SURGICAL HISTORY: Past Surgical History:  Procedure Laterality Date  . CORONARY ARTERY BYPASS GRAFT     times 4  . flexible cystoscopy    . repair of post infarction posterior ventricular septal defect      FAMILY HISTORY: family history includes Cancer in his paternal aunt and paternal uncle.  SOCIAL HISTORY:  reports that he quit smoking about 6 years ago. His smoking use included cigarettes. He has a 67.50 pack-year smoking history. he has never used smokeless tobacco. He reports that he does not drink alcohol or use drugs.  The patient is single.  He is accompanied by his grandson.   ALLERGIES:  Chlorhexidine gluconate  MEDICATIONS:  Current Outpatient Medications  Medication Sig Dispense Refill  . amLODipine (NORVASC) 10 MG tablet take 1/2 tablet by mouth once daily 30 tablet 11  . apixaban (ELIQUIS) 5 MG TABS tablet Take 1 tablet (5 mg total) 2 (two) times daily by mouth. Take 2 tablets twice daily for 7 days then take 1 tablet twice daily 70 tablet 1  . carvedilol (COREG) 12.5 MG tablet take 1 tablet by mouth twice a day with meals **KEEP OFFICE VISIT** 30 tablet 10  . rosuvastatin (CRESTOR) 40 MG tablet take 1 tablet by mouth once daily **KEEP OFFICE VISIT** 30 tablet 10  . ondansetron (ZOFRAN) 8 MG tablet Take 1 tablet (8 mg total) by mouth 2 (two) times daily as needed (Nausea or vomiting). (Patient not taking: Reported on 11/26/2016) 30 tablet 1  . prochlorperazine (COMPAZINE) 10 MG tablet Take 1 tablet (10 mg total) by mouth every 6 (six) hours as needed (Nausea or vomiting). (Patient not taking: Reported on 11/26/2016) 30 tablet 1   No current facility-administered medications for this encounter.    Facility-Administered Medications Ordered in Other Encounters  Medication Dose Route Frequency Provider Last Rate Last Dose  . ceFAZolin (ANCEF) IVPB 2g/100 mL premix  2 g Intravenous 30 min Pre-Op Festus Aloe, MD        REVIEW OF SYSTEMS:  On review of systems, the patient reports that he is doing well overall. He reports his tiredness from treatment has improved. He also reports that he's had some persistent swelling in his legs. He denies any chest pain, shortness of breath, cough, fevers, chills, night sweats, unintended weight changes. He denies any bowel or bladder disturbances, and denies abdominal pain, nausea or vomiting. He denies any new musculoskeletal or joint aches or pains, new skin lesions or concerns. A complete review of systems is obtained and is otherwise negative.   PHYSICAL EXAM:  Wt Readings from Last 3 Encounters:  11/26/16 204 lb (92.5 kg)    11/04/16 200 lb 3.2 oz (90.8 kg)  10/14/16 198 lb 9.6 oz (90.1 kg)   Temp Readings from Last 3 Encounters:  11/26/16 97.9 F (36.6 C) (Oral)  11/26/16 98.6 F (37 C) (Oral)  11/11/16 98 F (36.7 C) (Oral)   BP Readings from Last 3 Encounters:  11/26/16 (!) 159/98  11/26/16 (!) 156/90  11/11/16 135/82   Pulse Readings from Last 3 Encounters:  11/26/16 66  11/26/16 71  11/11/16 65   Pain Assessment Pain Score: 0-No pain/10  In general this is a well appearing african [place] male in no acute distress. He is alert and oriented x4 and appropriate throughout the examination. HEENT reveals that the patient is normocephalic, atraumatic. EOMs are intact. PERRLA. Skin is intact without any evidence of gross lesions. Cardiopulmonary assessment is negative for acute distress and he  exhibits normal effort.   KPS = 90  100 - Normal; no complaints; no evidence of disease. 90   - Able to carry on normal activity; minor signs or symptoms of disease. 80   - Normal activity with effort; some signs or symptoms of disease. 60   - Cares for self; unable to carry on normal activity or to do active work. 60   - Requires occasional assistance, but is able to care for most of his personal needs. 50   - Requires considerable assistance and frequent medical care. 64   - Disabled; requires special care and assistance. 35   - Severely disabled; hospital admission is indicated although death not imminent. 39   - Very sick; hospital admission necessary; active supportive treatment necessary. 10   - Moribund; fatal processes progressing rapidly. 0     - Dead  Karnofsky DA, Abelmann [place], Craver LS and Burchenal [facility] [phone]) The use of the nitrogen mustards in the palliative treatment of carcinoma: with particular reference to bronchogenic carcinoma Cancer 1 [id number]   LABORATORY DATA:  Lab Results  Component Value Date   WBC 5.8 11/26/2016   HGB 8.8 (L) 11/26/2016   HCT 27.2 (L) 11/26/2016   MCV 87.3  11/26/2016   PLT 367 11/26/2016   Lab Results  Component Value Date   NA 140 11/26/2016   K 4.0 11/26/2016   CL 111 06/27/2016   CO2 20 (L) 11/26/2016   Lab Results  Component Value Date   ALT 16 11/26/2016   AST 19 11/26/2016   ALKPHOS 100 11/26/2016   BILITOT <0.22 11/26/2016     RADIOGRAPHY: Ct Chest Wo Contrast  Result Date: 11/25/2016 CLINICAL DATA:  Pancreatic cancer, on chemotherapy. History of prostate cancer. Cough x1 week. EXAM: CT CHEST WITHOUT CONTRAST TECHNIQUE: Multidetector CT imaging of the chest was performed following the standard protocol without IV contrast. COMPARISON:  CT chest dated 09/19/2016 FINDINGS: Cardiovascular: Heart is top-normal in size. No pericardial effusion. No evidence of thoracic aortic aneurysm. Mild atherosclerotic calcifications of the aortic arch. Three vessel coronary atherosclerosis. Postsurgical changes related to prior CABG. Mediastinum/Nodes: No suspicious mediastinal lymphadenopathy. Multiple bilateral thyroid nodules including a stable dominant 3.7 cm right lower thyroid nodule. Lungs/Pleura: Mild left apical pleural-parenchymal scarring. Mild centrilobular and paraseptal emphysematous changes, upper lobe predominant. No focal consolidation. Scattered bilateral pulmonary nodules, predominantly subpleural, including a dominant 6 x 4 mm subpleural nodule in the lateral right upper lobe (series 7/image 51) and a dominant 7 x 4 mm subpleural nodule in the lateral right middle lobe (series 7/ image 86), grossly unchanged. 6 mm nodule in the medial left lung base the (series 7/ image 98), grossly unchanged. 6 mm ground-glass nodule in the lateral right upper lobe (series 7/image 62), less conspicuous than on the prior study. Mild compressive atelectasis/scarring anteriorly at the left lung base (series 7/image 98). No pleural effusion or pneumothorax. Upper Abdomen: Visualized upper abdomen is notable for pancreatic duct dilatation related to known  pancreatic malignancy, better evaluated on dedicated MRI. Musculoskeletal: Visualized osseous structures are within normal limits. Median sternotomy. IMPRESSION: Scattered bilateral pulmonary nodules measuring up to 6 mm, grossly unchanged, favoring a benign etiology given the subpleural location. However, given only two month stability, additional follow-up CT chest is suggested in 4-6 months. (A short time interval is unlikely to be definitive.) 6 mm ground-glass nodule in the lateral right upper lobe, less conspicuous than on the prior study. Attention at the time of follow-up is suggested. Aortic  Atherosclerosis (ICD10-I70.0) and Emphysema (ICD10-J43.9). Electronically Signed   By: Julian Hy M.D.   On: 11/25/2016 10:42   Mr Abdomen Wo Contrast  Result Date: 11/25/2016 CLINICAL DATA:  71 year old male with history of pancreatic mass. Follow-up study. EXAM: MRI ABDOMEN WITHOUT CONTRAST TECHNIQUE: Multiplanar multisequence MR imaging was performed without the administration of intravenous contrast. COMPARISON:  MRI of the abdomen 07/28/2016. FINDINGS: Lower chest: Cardiomegaly. Susceptibility artifact in the region of the sternum, presumably from median sternotomy wires. Hepatobiliary: No cystic or solid hepatic lesions. MRCP images demonstrate no intra or extrahepatic biliary ductal dilatation. Common bile duct measures 3 mm in the porta hepatis. Pancreas: MRCP images demonstrate pancreatic ductal dilatation measuring up to 15 mm in the body of the pancreas, increased compared to the prior examination. This ductal dilatation abruptly terminates in the region of the head of the pancreas where there is an ill-defined area of soft tissue fullness estimated to measure approximately 2.5 cm in diameter (axial image 22 of series 10) which is poorly demonstrated on today's noncontrast examination, but corresponds to the subtly enhancing lesion seen on prior MRI 07/28/2016. No peripancreatic inflammatory  changes or fluid collections. Spleen:  Unremarkable. Adrenals/Urinary Tract: Multiple small renal lesions are again noted throughout both kidneys, and generally T1 hypointense and T2 hyperintense, incompletely characterized on today's noncontrast examination but presumably small cysts. In addition, there is a small exophytic 1.1 cm T1 hyperintense T2 hypointense lesion extending from the posterior aspect of the upper pole of the left kidney, presumably a proteinaceous/hemorrhagic cyst. No hydroureteronephrosis in the visualized portions of the abdomen. Stomach/Bowel: Visualized portions are unremarkable. Vascular/Lymphatic: Aortic atherosclerosis without definite aneurysm in the abdominal vasculature. No lymphadenopathy noted in the abdomen. Other: No significant volume of ascites noted the visualized portions of the peritoneal cavity. Musculoskeletal: No aggressive appearing osseous lesions are noted in the visualized portions of the skeleton. IMPRESSION: 1. Persistent small mass-like area of fullness in the pancreatic head, grossly stable in size on today's noncontrast examination measuring approximately 2.5 cm, with persistent pancreatic ductal dilatation distal to this lesion. Findings remain concerning for underlying neoplasm. No lymphadenopathy or definite signs of metastatic disease in the abdomen on today's noncontrast examination. Electronically Signed   By: Vinnie Langton M.D.   On: 11/25/2016 10:57   Mr 3d Recon At Scanner  Result Date: 11/25/2016 CLINICAL DATA:  71 year old male with history of pancreatic mass. Follow-up study. EXAM: MRI ABDOMEN WITHOUT CONTRAST TECHNIQUE: Multiplanar multisequence MR imaging was performed without the administration of intravenous contrast. COMPARISON:  MRI of the abdomen 07/28/2016. FINDINGS: Lower chest: Cardiomegaly. Susceptibility artifact in the region of the sternum, presumably from median sternotomy wires. Hepatobiliary: No cystic or solid hepatic lesions.  MRCP images demonstrate no intra or extrahepatic biliary ductal dilatation. Common bile duct measures 3 mm in the porta hepatis. Pancreas: MRCP images demonstrate pancreatic ductal dilatation measuring up to 15 mm in the body of the pancreas, increased compared to the prior examination. This ductal dilatation abruptly terminates in the region of the head of the pancreas where there is an ill-defined area of soft tissue fullness estimated to measure approximately 2.5 cm in diameter (axial image 22 of series 10) which is poorly demonstrated on today's noncontrast examination, but corresponds to the subtly enhancing lesion seen on prior MRI 07/28/2016. No peripancreatic inflammatory changes or fluid collections. Spleen:  Unremarkable. Adrenals/Urinary Tract: Multiple small renal lesions are again noted throughout both kidneys, and generally T1 hypointense and T2 hyperintense, incompletely characterized on today's noncontrast examination  but presumably small cysts. In addition, there is a small exophytic 1.1 cm T1 hyperintense T2 hypointense lesion extending from the posterior aspect of the upper pole of the left kidney, presumably a proteinaceous/hemorrhagic cyst. No hydroureteronephrosis in the visualized portions of the abdomen. Stomach/Bowel: Visualized portions are unremarkable. Vascular/Lymphatic: Aortic atherosclerosis without definite aneurysm in the abdominal vasculature. No lymphadenopathy noted in the abdomen. Other: No significant volume of ascites noted the visualized portions of the peritoneal cavity. Musculoskeletal: No aggressive appearing osseous lesions are noted in the visualized portions of the skeleton. IMPRESSION: 1. Persistent small mass-like area of fullness in the pancreatic head, grossly stable in size on today's noncontrast examination measuring approximately 2.5 cm, with persistent pancreatic ductal dilatation distal to this lesion. Findings remain concerning for underlying neoplasm. No  lymphadenopathy or definite signs of metastatic disease in the abdomen on today's noncontrast examination. Electronically Signed   By: Vinnie Langton M.D.   On: 11/25/2016 10:57      IMPRESSION/PLAN:  1. Stage IA, cT1cN0M0 adenocarcinoma of the pancreas.  This is the findings from the patient's recent imaging and GI Tumor conference discussion this morning.  He continues to be somewhat of a borderline surgical candidate more so due to performance status and anatomy, but is still considering meeting with Dr. Barry Dienes.  After discussing the recovery after Whipple procedure, the patient indicates that he would not be is interested in this type of approach, we also discussed the options for stereotactic radiotherapy, and would offer a course of 5 fractions to this site. He would need to undergo fiducial marker placment under EUS at [facility] with Dr. Jerene Pitch. He is in agreement to proceed in this manner. We will contact them for EUS and fiducial placement, and see him back to simulate. We discussed the risks, benefits, short, and long term effects of radiotherapy, and the patient is interested in proceeding. Written consent is obtained and placed in the chart, a copy was provided to the patient. 2. High risk, stage IV, pT4N1 adenocarcinoma of the prostate with a PSA of 17.5 and a Gleason score of 5+5.  The patient will be followed for his pancreas cancer by Dr. Burr Medico, and we will ask that she keep [place] informed on his status if/when he is able to proceed with radiotherapy to the prostate.     We spent 45 minutes face to face with the patient and more than 50% of that time was spent in counseling and/or coordination of care.   The above documentation reflects my direct findings during this shared patient visit. Please see the separate note by Dr. Lisbeth Renshaw on this date for the remainder of the patient's plan of care.     Carola Rhine, PAC

## 2016-11-26 NOTE — Progress Notes (Addendum)
[place]  [phone]) [phone] [phone]  Clinic Follow up Note   Patient Care Team: Patient, No Pcp Per as PCP - General (General Practice) 11/26/2016  CHIEF COMPLAINT:  F/u pancreas cancer   SUMMARY OF ONCOLOGIC HISTORY: Oncology History   Cancer Staging Pancreatic cancer [facility]) Staging form: Exocrine Pancreas, AJCC 8th Edition - Clinical: Stage IA (cT1c, cN0, cM0) - Signed by Truitt Merle, MD on 09/14/2016       Malignant neoplasm of prostate ([place])   06/27/2016 Initial Biopsy    Diagnosis 06/27/16 1. Urethra, biopsy, bulb KERATINIZED SQUAMOUS CELL METAPLASIA AND HYPERPLASIA 2. Ureter, biopsy, right orifice METASTATIC PROSTATIC ADENOCARCINOMA, GLEASON SCORE 5+5=10 INVOLVES 50% OF THE SPECIMEN 3. Urethra, biopsy, prostatic polyp PROSTATIC ADENOCARCINOMA, GLEASON SCORE 5+4=9 INVOLVES 30% OF THE RESECTED TISSUE 4. Bladder, biopsy, posterior METASTATIC PROSTATIC ADENOCARCINOMA, INVOLVING DETRUSOR MUSCLE BENIGN UROTHELIUM WITH POLYPOID CYSTITIS 5. Prostate, chips PROSTATIC ADENOCARCINOMA, GLEASON SCORE 5+5=10 INVOLVES 80% OF THE RESECTED TISSUE  ADDENDUM: Immunohistochemistry is attempted on the cell block and there is limited tumor present. The scant tumor present is negative with prostein and prostate specific antigen. Called to Dr. Burr Medico and Dr. Ardis Hughs on 09/17/16. (JDP:gt, 09/17/16)      07/25/2016 Initial Diagnosis    Malignant neoplasm of prostate ([place])      09/19/2016 Imaging    CT Chest IMPRESSION: 1. Several small solid pulmonary nodules scattered in both lungs, largest 6 mm, indeterminate but more likely benign given predominantly subpleural location of the nodules. Recommend initial follow-up chest CT in 3 months. 2. No thoracic adenopathy or other potential findings of metastatic disease in the chest. 3. Right upper lobe 1.1 cm ground-glass pulmonary nodule. Initial follow-up with CT at 6-12 months is recommended to  confirm persistence. If persistent, repeat CT is recommended every 2 years until 5 years of stability has been established. This recommendation follows the consensus statement: Guidelines for Management of Incidental Pulmonary Nodules Detected on CT Images: From the Fleischner Society 2017; Radiology 2017; 284:228-243. 4. Mild cardiomegaly. Three-vessel coronary atherosclerosis status post CABG. 5. Multinodular goiter with dominant 3.7 cm right thyroid lobe nodule, for which thyroid ultrasound correlation is warranted, which may be performed if clinically warranted. 6. Re- demonstration of diffuse pancreatic duct dilation due to known pancreatic malignancy.        Pancreatic cancer ([place])   07/08/2016 Imaging    NM Bone Scan Whole Body 07/08/16 IMPRESSION: There are no findings suspicious for metastatic disease to the skeleton.      07/28/2016 Imaging    MR Abd w/wo contrast 07/28/16 IMPRESSION: 1. There is a suspicious lesion involving the head of pancreas and obstructing the pancreatic duct is identified. Involvement of the portal vein and portal venous confluence is suspected. Findings worrisome for pancreatic adenocarcinoma. Further investigation with endoscopic ultrasound and tissue sampling advise. 2. Resolution of bilateral hydronephrosis. Bilateral kidney cysts noted. 3. Aortic atherosclerosis.      08/28/2016 Procedure    EUS by Dr. Ardis Hughs 08/28/16 IMPRESSION:  1.9cm by 1.5cm mass in the head of pancreas causing main pancreatic duct obstruction and directly abutting the portal vein (suggesting invasion). The mass is not obstructing the bile duct. Preliminary reading of FNA shows "very atypical cells."        08/29/2016 Initial Biopsy    Diagnosis 08/29/16 FINE NEEDLE ASPIRATION, ENDOSCOPIC, PANCREAS HEAD(SPECIMEN 1 OF 1 COLLECTED 08/28/16): POORLY DIFFERENTIATED CARCINOMA ASSOCIATED WITH ACUTE INFLAMMATION. SEE COMMENT. Comment: There are a few cells with  features suggestive of adenocarcinoma.  09/12/2016 Initial Diagnosis    Pancreatic cancer ([place])      09/19/2016 Imaging    CT Chest WO Contrast 09/19/16 IMPRESSION: 1. Several small solid pulmonary nodules scattered in both lungs, largest 6 mm, indeterminate but more likely benign given predominantly subpleural location of the nodules. Recommend initial follow-up chest CT in 3 months. 2. No thoracic adenopathy or other potential findings of metastatic disease in the chest. 3. Right upper lobe 1.1 cm ground-glass pulmonary nodule. Initial follow-up with CT at 6-12 months is recommended to confirm persistence. If persistent, repeat CT is recommended every 2 years until 5 years of stability has been established. This recommendation follows the consensus statement: Guidelines for Management of Incidental Pulmonary Nodules Detected on CT Images: From the Fleischner Society 2017; Radiology 2017; 284:228-243. 4. Mild cardiomegaly. Three-vessel coronary atherosclerosis status post CABG. 5. Multinodular goiter with dominant 3.7 cm right thyroid lobe nodule, for which thyroid ultrasound correlation is warranted, which may be performed if clinically warranted. 6. Re- demonstration of diffuse pancreatic duct dilation due to known pancreatic malignancy.      09/26/2016 -  Chemotherapy    neoadjuvant Gemcitabine and Abraxane on day 1, 8 every 3 weeks started 09/26/16, will hold Abraxane for first 1-2 doses, reduced gemcitabine dosed to 800 mg/m       11/25/2016 Imaging    MR ABDOMEN WO CONTRAST IMPRESSION: 1. Persistent small mass-like area of fullness in the pancreatic head, grossly stable in size on today's noncontrast examination measuring approximately 2.5 cm, with persistent pancreatic ductal dilatation distal to this lesion. Findings remain concerning for underlying neoplasm. No lymphadenopathy or definite signs of metastatic disease in the abdomen on today's  noncontrast Examination.  CT CHEST WO CONTRAST IMPRESSION: Scattered bilateral pulmonary nodules measuring up to 6 mm, grossly unchanged, favoring a benign etiology given the subpleural location. However, given only two month stability, additional follow-up CT chest is suggested in 4-6 months. (A short time interval is unlikely to be definitive.)  6 mm ground-glass nodule in the lateral right upper lobe, less conspicuous than on the prior study. Attention at the time of follow-up is suggested.  Aortic Atherosclerosis (ICD10-I70.0) and Emphysema (ICD10-J43.9).      CURRENT THERAPY: neoadjuvant Gemcitabine and Abraxane on day 1, 8 every 21 days, Gemcitabine reduced dose to 800 mg/m^2, Held Abraxane for first 2 doses started 09/26/16; abraxane only given with cycle 1 day 15, tolerated poorly; patient cancelled cycle 2 day 8 chemo due to scheduling conflict.    INTERVAL HISTORY: Mr. Tipps returns today with his son as scheduled to discuss scan results. He received gemzar only chemo cycle 2 day 1 on 10/30, he cancelled day 8 due to scheduling conflicts. He had restaging scan on 11/25/16. He has been feeling well in the interim. Had a cold that is not resolved. Eating and drinking well, no fatigue, abdominal pain, n/v/c/d, fever, chills, cough, chest pain, dyspnea or other concerns. He has some swelling in his legs bilaterally, known right DVT, no calf tenderness; taking eliquis daily now.   REVIEW OF SYSTEMS:   Constitutional: Denies fatigue, fevers, chills or abnormal weight loss (+) good appetite  Eyes: Denies blurriness of vision Ears, nose, mouth, throat, and face: Denies mucositis or sore throat Respiratory: Denies cough, dyspnea or wheezes Cardiovascular: Denies palpitation, chest discomfort (+) lower extremity swelling, no calf pain (+) right DVT Gastrointestinal:  Denies nausea, vomiting, constipation, diarrhea, heartburn or change in bowel habits Skin: Denies abnormal skin  rashes Lymphatics: Denies new lymphadenopathy or  easy bruising Neurological:Denies numbness, tingling or new weaknesses Behavioral/Psych: Mood is stable, no new changes  All other systems were reviewed with the patient and are negative.  MEDICAL HISTORY:  Past Medical History:  Diagnosis Date  . Acute MI, inferior wall ([place]) 1966  . Amputation finger    right first finger top portion age 20  . CHF (congestive heart failure) ([place])   . Coronary artery disease    severe 3 vessel   . Fall   . Foley catheter in place   . H/O hyperkalemia   . H/O thrombocytosis   . Hip fracture, left ([place])    11/2014  . History of blood transfusion   . History of metabolic acidosis   . History of tobacco abuse   . Hypertension   . Obstructive uropathy   . Pneumonia    childhood   . Prostate cancer ([place])   . S/P VSD repair    09/2009  . Trichomonas infection   . Urethral stricture due to infective diseases classified elsewhere   . Urinary tract infection     SURGICAL HISTORY: Past Surgical History:  Procedure Laterality Date  . CORONARY ARTERY BYPASS GRAFT     times 4  . flexible cystoscopy    . repair of post infarction posterior ventricular septal defect      I have reviewed the social history and family history with the patient and they are unchanged from previous note.  ALLERGIES:  is allergic to chlorhexidine gluconate.  MEDICATIONS:  Current Outpatient Medications  Medication Sig Dispense Refill  . amLODipine (NORVASC) 10 MG tablet take 1/2 tablet by mouth once daily 30 tablet 11  . apixaban (ELIQUIS) 5 MG TABS tablet Take 1 tablet (5 mg total) 2 (two) times daily by mouth. Take 2 tablets twice daily for 7 days then take 1 tablet twice daily 70 tablet 1  . carvedilol (COREG) 12.5 MG tablet take 1 tablet by mouth twice a day with meals **KEEP OFFICE VISIT** 30 tablet 10  . prochlorperazine (COMPAZINE) 10 MG tablet Take 1 tablet (10 mg total) by mouth every 6 (six) hours as needed  (Nausea or vomiting). 30 tablet 1  . ondansetron (ZOFRAN) 8 MG tablet Take 1 tablet (8 mg total) by mouth 2 (two) times daily as needed (Nausea or vomiting). (Patient not taking: Reported on 11/26/2016) 30 tablet 1  . rosuvastatin (CRESTOR) 40 MG tablet take 1 tablet by mouth once daily **KEEP OFFICE VISIT** (Patient not taking: Reported on 11/26/2016) 30 tablet 10   No current facility-administered medications for this visit.    Facility-Administered Medications Ordered in Other Visits  Medication Dose Route Frequency Provider Last Rate Last Dose  . ceFAZolin (ANCEF) IVPB 2g/100 mL premix  2 g Intravenous 30 min Pre-Op Festus Aloe, MD        PHYSICAL EXAMINATION: ECOG PERFORMANCE STATUS: 1 - Symptomatic but completely ambulatory  Vitals:   11/26/16 0854  BP: (!) 156/90  Pulse: 71  Resp: 18  Temp: 98.6 F (37 C)  SpO2: 100%  No weight documented today  GENERAL:alert, no distress and comfortable SKIN: skin color, texture, turgor are normal, no rashes or significant lesions EYES: normal, Conjunctiva are pink and non-injected, sclera clear OROPHARYNX:no exudate, no erythema and lips, buccal mucosa, and tongue normal  NECK: supple, thyroid normal size, non-tender, without nodularity LYMPH:  no palpable cervical or supraclavicular lymphadenopathy LUNGS: clear to auscultation bilaterally with normal breathing effort HEART: regular rate & rhythm and no murmurs (+)lower extremity  edema, R>L improving.  ABDOMEN:abdomen soft, non-tender and normal bowel sounds Musculoskeletal:no cyanosis of digits and no clubbing  NEURO: alert & oriented x 3 with fluent speech, no focal motor/sensory deficits  LABORATORY DATA:  I have reviewed the data as listed CBC Latest Ref Rng & Units 11/26/2016 11/17/2016 11/11/2016  WBC 4.0 - 10.3 10e3/uL 5.8 7.8 7.6  Hemoglobin 13.0 - 17.1 g/dL 8.8(L) 8.5(L) 8.6(L)  Hematocrit 38.4 - 49.9 % 27.2(L) 26.8(L) 27.3(L)  Platelets 140 - 400 10e3/uL 367 310  402(H)     CMP Latest Ref Rng & Units 11/26/2016 11/17/2016 11/11/2016  Glucose 70 - 140 mg/dl 104 152(H) 115  BUN 7.0 - 26.0 mg/dL 34.9(H) 40.0(H) 45.8(H)  Creatinine 0.7 - 1.3 mg/dL 2.3(H) 2.9(H) 2.9(H)  Sodium 136 - 145 mEq/L 140 133(L) 137  Potassium 3.5 - 5.1 mEq/L 4.0 4.1 3.7  Chloride 101 - 111 mmol/L - - -  CO2 22 - 29 mEq/L 20(L) 19(L) 18(L)  Calcium 8.4 - 10.4 mg/dL 9.6 9.9 9.4  Total Protein 6.4 - 8.3 g/dL 7.8 8.9(H) 7.8  Total Bilirubin 0.20 - 1.20 mg/dL <0.22 0.34 <0.22  Alkaline Phos 40 - 150 U/L 100 97 101  AST 5 - 34 U/L '19 22 12  '$ ALT 0 - 55 U/L 16 33 8    RADIOGRAPHIC STUDIES: I have personally reviewed the radiological images as listed and agreed with the findings in the report. Ct Chest Wo Contrast  Result Date: 11/25/2016 CLINICAL DATA:  Pancreatic cancer, on chemotherapy. History of prostate cancer. Cough x1 week. EXAM: CT CHEST WITHOUT CONTRAST TECHNIQUE: Multidetector CT imaging of the chest was performed following the standard protocol without IV contrast. COMPARISON:  CT chest dated 09/19/2016 FINDINGS: Cardiovascular: Heart is top-normal in size. No pericardial effusion. No evidence of thoracic aortic aneurysm. Mild atherosclerotic calcifications of the aortic arch. Three vessel coronary atherosclerosis. Postsurgical changes related to prior CABG. Mediastinum/Nodes: No suspicious mediastinal lymphadenopathy. Multiple bilateral thyroid nodules including a stable dominant 3.7 cm right lower thyroid nodule. Lungs/Pleura: Mild left apical pleural-parenchymal scarring. Mild centrilobular and paraseptal emphysematous changes, upper lobe predominant. No focal consolidation. Scattered bilateral pulmonary nodules, predominantly subpleural, including a dominant 6 x 4 mm subpleural nodule in the lateral right upper lobe (series 7/image 51) and a dominant 7 x 4 mm subpleural nodule in the lateral right middle lobe (series 7/ image 86), grossly unchanged. 6 mm nodule in the  medial left lung base the (series 7/ image 98), grossly unchanged. 6 mm ground-glass nodule in the lateral right upper lobe (series 7/image 62), less conspicuous than on the prior study. Mild compressive atelectasis/scarring anteriorly at the left lung base (series 7/image 98). No pleural effusion or pneumothorax. Upper Abdomen: Visualized upper abdomen is notable for pancreatic duct dilatation related to known pancreatic malignancy, better evaluated on dedicated MRI. Musculoskeletal: Visualized osseous structures are within normal limits. Median sternotomy. IMPRESSION: Scattered bilateral pulmonary nodules measuring up to 6 mm, grossly unchanged, favoring a benign etiology given the subpleural location. However, given only two month stability, additional follow-up CT chest is suggested in 4-6 months. (A short time interval is unlikely to be definitive.) 6 mm ground-glass nodule in the lateral right upper lobe, less conspicuous than on the prior study. Attention at the time of follow-up is suggested. Aortic Atherosclerosis (ICD10-I70.0) and Emphysema (ICD10-J43.9). Electronically Signed   By: Julian Hy M.D.   On: 11/25/2016 10:42   Mr Abdomen Wo Contrast  Result Date: 11/25/2016 CLINICAL DATA:  71 year old male with history of  pancreatic mass. Follow-up study. EXAM: MRI ABDOMEN WITHOUT CONTRAST TECHNIQUE: Multiplanar multisequence MR imaging was performed without the administration of intravenous contrast. COMPARISON:  MRI of the abdomen 07/28/2016. FINDINGS: Lower chest: Cardiomegaly. Susceptibility artifact in the region of the sternum, presumably from median sternotomy wires. Hepatobiliary: No cystic or solid hepatic lesions. MRCP images demonstrate no intra or extrahepatic biliary ductal dilatation. Common bile duct measures 3 mm in the porta hepatis. Pancreas: MRCP images demonstrate pancreatic ductal dilatation measuring up to 15 mm in the body of the pancreas, increased compared to the prior  examination. This ductal dilatation abruptly terminates in the region of the head of the pancreas where there is an ill-defined area of soft tissue fullness estimated to measure approximately 2.5 cm in diameter (axial image 22 of series 10) which is poorly demonstrated on today's noncontrast examination, but corresponds to the subtly enhancing lesion seen on prior MRI 07/28/2016. No peripancreatic inflammatory changes or fluid collections. Spleen:  Unremarkable. Adrenals/Urinary Tract: Multiple small renal lesions are again noted throughout both kidneys, and generally T1 hypointense and T2 hyperintense, incompletely characterized on today's noncontrast examination but presumably small cysts. In addition, there is a small exophytic 1.1 cm T1 hyperintense T2 hypointense lesion extending from the posterior aspect of the upper pole of the left kidney, presumably a proteinaceous/hemorrhagic cyst. No hydroureteronephrosis in the visualized portions of the abdomen. Stomach/Bowel: Visualized portions are unremarkable. Vascular/Lymphatic: Aortic atherosclerosis without definite aneurysm in the abdominal vasculature. No lymphadenopathy noted in the abdomen. Other: No significant volume of ascites noted the visualized portions of the peritoneal cavity. Musculoskeletal: No aggressive appearing osseous lesions are noted in the visualized portions of the skeleton. IMPRESSION: 1. Persistent small mass-like area of fullness in the pancreatic head, grossly stable in size on today's noncontrast examination measuring approximately 2.5 cm, with persistent pancreatic ductal dilatation distal to this lesion. Findings remain concerning for underlying neoplasm. No lymphadenopathy or definite signs of metastatic disease in the abdomen on today's noncontrast examination. Electronically Signed   By: Vinnie Langton M.D.   On: 11/25/2016 10:57   Mr 3d Recon At Scanner  Result Date: 11/25/2016 CLINICAL DATA:  71 year old male with history  of pancreatic mass. Follow-up study. EXAM: MRI ABDOMEN WITHOUT CONTRAST TECHNIQUE: Multiplanar multisequence MR imaging was performed without the administration of intravenous contrast. COMPARISON:  MRI of the abdomen 07/28/2016. FINDINGS: Lower chest: Cardiomegaly. Susceptibility artifact in the region of the sternum, presumably from median sternotomy wires. Hepatobiliary: No cystic or solid hepatic lesions. MRCP images demonstrate no intra or extrahepatic biliary ductal dilatation. Common bile duct measures 3 mm in the porta hepatis. Pancreas: MRCP images demonstrate pancreatic ductal dilatation measuring up to 15 mm in the body of the pancreas, increased compared to the prior examination. This ductal dilatation abruptly terminates in the region of the head of the pancreas where there is an ill-defined area of soft tissue fullness estimated to measure approximately 2.5 cm in diameter (axial image 22 of series 10) which is poorly demonstrated on today's noncontrast examination, but corresponds to the subtly enhancing lesion seen on prior MRI 07/28/2016. No peripancreatic inflammatory changes or fluid collections. Spleen:  Unremarkable. Adrenals/Urinary Tract: Multiple small renal lesions are again noted throughout both kidneys, and generally T1 hypointense and T2 hyperintense, incompletely characterized on today's noncontrast examination but presumably small cysts. In addition, there is a small exophytic 1.1 cm T1 hyperintense T2 hypointense lesion extending from the posterior aspect of the upper pole of the left kidney, presumably a proteinaceous/hemorrhagic cyst. No hydroureteronephrosis  in the visualized portions of the abdomen. Stomach/Bowel: Visualized portions are unremarkable. Vascular/Lymphatic: Aortic atherosclerosis without definite aneurysm in the abdominal vasculature. No lymphadenopathy noted in the abdomen. Other: No significant volume of ascites noted the visualized portions of the peritoneal cavity.  Musculoskeletal: No aggressive appearing osseous lesions are noted in the visualized portions of the skeleton. IMPRESSION: 1. Persistent small mass-like area of fullness in the pancreatic head, grossly stable in size on today's noncontrast examination measuring approximately 2.5 cm, with persistent pancreatic ductal dilatation distal to this lesion. Findings remain concerning for underlying neoplasm. No lymphadenopathy or definite signs of metastatic disease in the abdomen on today's noncontrast examination. Electronically Signed   By: Vinnie Langton M.D.   On: 11/25/2016 10:57     ASSESSMENT & PLAN: 71 y.o.African-American male, with past medical history of CAD, and recently diagnosed prostate cancer, gleason score 10, presented with incidental imaging finding of a pancreatic head mass.  1. Carcinoma of the head of the pancreas, cT1cN0M0,stage IA 2. CHF, HTN, CAD status post CABG 4 3. Newly diagnosed prostate cancer, Gleason score 10 4. CKD, stage III 5. Thyroid nodule 6. Lung nodules 7. Lower extremity edema, right greater than left 8. Acute deep vein thrombosis in the right proximal-mid peroneal veins; superficial vein thrombosis noted in left greater saphenous vein, left femoral artery nearly occluded  Mr. Mcwhirter appears stable today. MRI abdomen w/o contrast shows persistent small mass-like area of fullness in pancreatic head, grossly stable in size measuring approx 2.5 cm with persistent ductal dilatation distal to the lesion. No lymphadenopathy or definite signs of metastatic disease in the abdomen. CT chest wo contrast shows stable bilateral pulmonary nodules measuring up to 6 mm that are grossly unchanged and likely benign. 6 mm ground-glass nodule in lateral right upper lobe is less conspicuous than on prior study. This was reviewed in detail with the patient and his son today. We discussed his case in multidisciplinary GI conference today, he will be referred to rad onc for consideration  of local therapy today and will be arranged for f/u with Dr. Barry Dienes to discuss surgical option. He is agreeable to these appointments. He has known prostate cancer, will need fiducial markers and radiation per Dr. Tammi Klippel at some point after treatment for pancreatic malignancy.   Labs reviewed, he is mildly anemic with Hgb 8.8, he denies fatigue or exertional dyspnea; he does not require blood transfusion at this time. Low serum iron, 15, and transferrin sat 7%; although ferritin is elevated 567. In the setting of his CKD and because he is being considered for whipple surgery, he will begin OTC oral iron supplementation 1 tablet daily. Creatinine is slightly improved to 2.3 but stable overall; eGFR improved to 31; he was previously not a candidate for CT/MRI w contrast due to eGFR <30. His physical exam is notable for persistent R>L LE edema that is improving, he was started on eliquis 11/5 for acute DVT after coumadin failed to raise INR to therapeutic range. He has been taking eliquis daily; should be taking 1 tablet BID; he will start taking BID now.    Shona Simpson, PA was able to accommodate the patient for rad onc consult today immediately after this visit. He was informed and sent promptly down to rad onc dept. His son called another family member during this visit and I discussed the above with her. They are all in agreement of this plan.   PLAN: Rad onc consult today with Bryson Ha, [place] Refer back to Dr.  Byerly Begin oral iron supplementation 1 tablet daily Increase eliquis to 1 tablet BID (was taking 1 tablet daily) F/u open, will arrange after rad onc consult and f/u with Dr. Barry Dienes  All questions were answered. The patient knows to call the clinic with any problems, questions or concerns. No barriers to learning was detected.    Alla Feeling, NP 11/26/16

## 2016-11-27 ENCOUNTER — Telehealth: Payer: Self-pay | Admitting: Radiation Oncology

## 2016-11-27 ENCOUNTER — Telehealth: Payer: Self-pay | Admitting: *Deleted

## 2016-11-27 LAB — CANCER ANTIGEN 19-9: CA 19-9: 18 U/mL (ref 0–35)

## 2016-11-27 NOTE — Progress Notes (Signed)
Last lupron given on 11/18/2016 - his PSA is down to 0.95.

## 2016-11-27 NOTE — Telephone Encounter (Addendum)
I spoke with the patient to confirm his simulation appointment on December 16, 2016 at 9 AM.  [name] is to have fiducial markers placed at [facility] on 12/11/2016.  [name] is awaiting call for details about the time for this procedure.

## 2016-11-28 ENCOUNTER — Ambulatory Visit: Payer: Self-pay

## 2016-11-28 ENCOUNTER — Other Ambulatory Visit: Payer: Self-pay

## 2016-11-30 ENCOUNTER — Other Ambulatory Visit: Payer: Self-pay | Admitting: Hematology

## 2016-12-11 ENCOUNTER — Telehealth: Payer: Self-pay | Admitting: Radiation Oncology

## 2016-12-11 NOTE — Telephone Encounter (Signed)
I called the patient to coordinate simulation following his fiducial marker placement at [facility] on 12/16/16. He was not available so I left a message asking him to call back to schedule simulation next week or the following.

## 2016-12-12 ENCOUNTER — Other Ambulatory Visit: Payer: Self-pay

## 2016-12-12 ENCOUNTER — Ambulatory Visit: Payer: Self-pay

## 2016-12-16 ENCOUNTER — Ambulatory Visit: Payer: Medicare Other | Admitting: Radiation Oncology

## 2016-12-16 DIAGNOSIS — C61 Malignant neoplasm of prostate: Secondary | ICD-10-CM | POA: Diagnosis not present

## 2016-12-16 DIAGNOSIS — K869 Disease of pancreas, unspecified: Secondary | ICD-10-CM | POA: Diagnosis not present

## 2016-12-16 DIAGNOSIS — C259 Malignant neoplasm of pancreas, unspecified: Secondary | ICD-10-CM | POA: Diagnosis not present

## 2016-12-16 DIAGNOSIS — C7889 Secondary malignant neoplasm of other digestive organs: Secondary | ICD-10-CM | POA: Diagnosis not present

## 2016-12-16 DIAGNOSIS — N183 Chronic kidney disease, stage 3 (moderate): Secondary | ICD-10-CM | POA: Diagnosis not present

## 2016-12-16 DIAGNOSIS — C250 Malignant neoplasm of head of pancreas: Secondary | ICD-10-CM | POA: Diagnosis not present

## 2016-12-18 ENCOUNTER — Encounter (HOSPITAL_COMMUNITY): Payer: Self-pay | Admitting: Radiology

## 2016-12-24 ENCOUNTER — Ambulatory Visit
Admission: RE | Admit: 2016-12-24 | Discharge: 2016-12-24 | Disposition: A | Discharge status: Home / Self Care | Payer: Medicare Other | Source: Ambulatory Visit | Attending: Radiation Oncology | Admitting: Radiation Oncology

## 2016-12-24 DIAGNOSIS — C61 Malignant neoplasm of prostate: Secondary | ICD-10-CM | POA: Diagnosis not present

## 2016-12-24 DIAGNOSIS — E440 Moderate protein-calorie malnutrition: Secondary | ICD-10-CM | POA: Diagnosis not present

## 2016-12-24 DIAGNOSIS — C250 Malignant neoplasm of head of pancreas: Secondary | ICD-10-CM

## 2016-12-24 DIAGNOSIS — I5022 Chronic systolic (congestive) heart failure: Secondary | ICD-10-CM | POA: Diagnosis not present

## 2016-12-24 DIAGNOSIS — D473 Essential (hemorrhagic) thrombocythemia: Secondary | ICD-10-CM | POA: Diagnosis not present

## 2016-12-29 NOTE — Addendum Note (Signed)
Encounter addended by: Kyung Rudd, MD on: 12/29/2016 8:31 AM  Actions taken: Edit attestation on clinical note

## 2016-12-30 ENCOUNTER — Telehealth: Payer: Self-pay | Admitting: Nurse Practitioner

## 2016-12-30 DIAGNOSIS — I5022 Chronic systolic (congestive) heart failure: Secondary | ICD-10-CM | POA: Diagnosis not present

## 2016-12-30 DIAGNOSIS — E440 Moderate protein-calorie malnutrition: Secondary | ICD-10-CM | POA: Diagnosis not present

## 2016-12-30 DIAGNOSIS — D473 Essential (hemorrhagic) thrombocythemia: Secondary | ICD-10-CM | POA: Diagnosis not present

## 2016-12-30 DIAGNOSIS — C61 Malignant neoplasm of prostate: Secondary | ICD-10-CM | POA: Diagnosis not present

## 2016-12-30 DIAGNOSIS — C250 Malignant neoplasm of head of pancreas: Secondary | ICD-10-CM | POA: Diagnosis not present

## 2016-12-30 NOTE — Telephone Encounter (Signed)
Spoke to patient regarding upcoming January appointments per 12/12 sch message

## 2016-12-31 ENCOUNTER — Ambulatory Visit
Admission: RE | Admit: 2016-12-31 | Discharge: 2016-12-31 | Disposition: A | Discharge status: Home / Self Care | Payer: Medicare Other | Source: Ambulatory Visit | Attending: Radiation Oncology | Admitting: Radiation Oncology

## 2016-12-31 DIAGNOSIS — C61 Malignant neoplasm of prostate: Secondary | ICD-10-CM | POA: Diagnosis not present

## 2016-12-31 DIAGNOSIS — I5022 Chronic systolic (congestive) heart failure: Secondary | ICD-10-CM | POA: Diagnosis not present

## 2016-12-31 DIAGNOSIS — E440 Moderate protein-calorie malnutrition: Secondary | ICD-10-CM | POA: Diagnosis not present

## 2016-12-31 DIAGNOSIS — D473 Essential (hemorrhagic) thrombocythemia: Secondary | ICD-10-CM | POA: Diagnosis not present

## 2017-01-02 ENCOUNTER — Ambulatory Visit
Admission: RE | Admit: 2017-01-02 | Discharge: 2017-01-02 | Disposition: A | Discharge status: Home / Self Care | Payer: Medicare Other | Source: Ambulatory Visit | Attending: Radiation Oncology | Admitting: Radiation Oncology

## 2017-01-02 DIAGNOSIS — C61 Malignant neoplasm of prostate: Secondary | ICD-10-CM | POA: Diagnosis not present

## 2017-01-02 DIAGNOSIS — D473 Essential (hemorrhagic) thrombocythemia: Secondary | ICD-10-CM | POA: Diagnosis not present

## 2017-01-02 DIAGNOSIS — E440 Moderate protein-calorie malnutrition: Secondary | ICD-10-CM | POA: Diagnosis not present

## 2017-01-02 DIAGNOSIS — I5022 Chronic systolic (congestive) heart failure: Secondary | ICD-10-CM | POA: Diagnosis not present

## 2017-01-05 ENCOUNTER — Ambulatory Visit
Admission: RE | Admit: 2017-01-05 | Discharge: 2017-01-05 | Disposition: A | Discharge status: Home / Self Care | Payer: Medicare Other | Source: Ambulatory Visit | Attending: Radiation Oncology | Admitting: Radiation Oncology

## 2017-01-05 DIAGNOSIS — I5022 Chronic systolic (congestive) heart failure: Secondary | ICD-10-CM | POA: Diagnosis not present

## 2017-01-05 DIAGNOSIS — D473 Essential (hemorrhagic) thrombocythemia: Secondary | ICD-10-CM | POA: Diagnosis not present

## 2017-01-05 DIAGNOSIS — C61 Malignant neoplasm of prostate: Secondary | ICD-10-CM | POA: Diagnosis not present

## 2017-01-05 DIAGNOSIS — E440 Moderate protein-calorie malnutrition: Secondary | ICD-10-CM | POA: Diagnosis not present

## 2017-01-07 ENCOUNTER — Ambulatory Visit: Payer: Medicare Other

## 2017-01-07 ENCOUNTER — Ambulatory Visit
Admission: RE | Admit: 2017-01-07 | Discharge: 2017-01-07 | Disposition: A | Discharge status: Home / Self Care | Payer: Medicare Other | Source: Ambulatory Visit | Attending: Radiation Oncology | Admitting: Radiation Oncology

## 2017-01-07 DIAGNOSIS — D473 Essential (hemorrhagic) thrombocythemia: Secondary | ICD-10-CM | POA: Diagnosis not present

## 2017-01-07 DIAGNOSIS — E440 Moderate protein-calorie malnutrition: Secondary | ICD-10-CM | POA: Diagnosis not present

## 2017-01-07 DIAGNOSIS — C61 Malignant neoplasm of prostate: Secondary | ICD-10-CM | POA: Diagnosis not present

## 2017-01-07 DIAGNOSIS — I5022 Chronic systolic (congestive) heart failure: Secondary | ICD-10-CM | POA: Diagnosis not present

## 2017-01-08 ENCOUNTER — Ambulatory Visit: Payer: Medicare Other

## 2017-01-09 ENCOUNTER — Ambulatory Visit
Admission: RE | Admit: 2017-01-09 | Discharge: 2017-01-09 | Disposition: A | Discharge status: Home / Self Care | Payer: Medicare Other | Source: Ambulatory Visit | Attending: Radiation Oncology | Admitting: Radiation Oncology

## 2017-01-09 ENCOUNTER — Ambulatory Visit: Payer: Medicare Other

## 2017-01-09 ENCOUNTER — Encounter: Payer: Self-pay | Admitting: Radiation Oncology

## 2017-01-09 DIAGNOSIS — D473 Essential (hemorrhagic) thrombocythemia: Secondary | ICD-10-CM | POA: Diagnosis not present

## 2017-01-09 DIAGNOSIS — E440 Moderate protein-calorie malnutrition: Secondary | ICD-10-CM | POA: Diagnosis not present

## 2017-01-09 DIAGNOSIS — I5022 Chronic systolic (congestive) heart failure: Secondary | ICD-10-CM | POA: Diagnosis not present

## 2017-01-09 DIAGNOSIS — C250 Malignant neoplasm of head of pancreas: Secondary | ICD-10-CM | POA: Diagnosis not present

## 2017-01-09 DIAGNOSIS — C61 Malignant neoplasm of prostate: Secondary | ICD-10-CM | POA: Diagnosis not present

## 2017-01-10 NOTE — Progress Notes (Signed)
  Radiation Oncology         [phone] ________________________________  Name: [name] MRN: [id number]  Date: 12/24/2016  DOB: 22-Feb-1945  RESPIRATORY MOTION MANAGEMENT SIMULATION  NARRATIVE:  In order to account for effect of respiratory motion on target structures and other organs in the planning and delivery of radiotherapy, this patient underwent respiratory motion management simulation.  To accomplish this, when the patient was brought to the CT simulation planning suite, 4D respiratoy motion management CT images were obtained.  The CT images were loaded into the planning software.  Then, using a variety of tools including Cine, MIP, and standard views, the target volume and planning target volumes (PTV) were delineated.  Avoidance structures were contoured.  Treatment planning then occurred.  Dose volume histograms were generated and reviewed for each of the requested structure.  The resulting plan was carefully reviewed and approved today.   ------------------------------------------------  Jodelle Gross, MD, PhD

## 2017-01-10 NOTE — Progress Notes (Signed)
[facility] Simulation and Treatment Planning Note   Name:  ERINN HUSKINS MRN: [id number]   Date: 01/10/2017  DOB: [birth year]  Status:outpatient    DIAGNOSIS: No diagnosis found.   CONSENT VERIFIED:yes   SET UP: Patient is setup supine   IMMOBILIZATION: The patient was immobilized using a Vac Loc bag and customized accuform device  NARRATIVE:The patient was brought to the [facility].  Identity was confirmed.  All relevant records and images related to the planned course of therapy were reviewed.  Then, the patient was positioned in a stable reproducible clinical set-up for radiation therapy. Abdominal compression was applied.  4D CT images were obtained and reproducible breathing pattern was confirmed. Free breathing CT images were obtained.  Skin markings were placed.  The CT images were loaded into the planning software where the target and avoidance structures were contoured.  The radiation prescription was entered and confirmed.    TREATMENT PLANNING NOTE:  Treatment planning then occurred. I have requested : MLC's, isodose plan, basic dose calculation.  3 dimensional simulation is performed and dose volume histogram of the gross tumor volume, planning tumor volume and criticial normal structures including the spinal cord and lungs were analyzed and requested.  Special treatment procedure was performed due to high dose per fraction.  The patient will be monitored for increased risk of toxicity.  Daily imaging using cone beam CT will be used for target localization.  I anticipate that the patient will receive 33 Gy in 5 fractions to target volume. Further adjustments will be made based on the planning process is necessary.  ------------------------------------------------  Jodelle Gross, MD, PhD

## 2017-01-12 ENCOUNTER — Ambulatory Visit: Payer: Medicare Other

## 2017-01-14 ENCOUNTER — Ambulatory Visit: Payer: Medicare Other

## 2017-01-16 ENCOUNTER — Ambulatory Visit: Payer: Medicare Other

## 2017-01-22 NOTE — Progress Notes (Signed)
  Radiation Oncology         [phone] ________________________________  Name: [name] MRN: [id number]  Date: 01/09/2017  DOB: November 04, 1945  End of Treatment Note  Diagnosis:   72 y.o. male with T2N0M0 adenocarcinoma of the pancreas    Indication for treatment::  curative       Radiation treatment dates:   12/30/2016, 01/02/2017, 01/05/2017, 01/07/2017, 01/09/2017  Site/dose:   The pancreas was treated to 33 Gy in 5 fractions of 6.6 Gy using the SBRT/SRT-3D technique.  Narrative: The patient tolerated radiation treatment relatively well without any significant acute effects.  Plan: The patient has completed radiation treatment. The patient will begin taking Prilosec medication once per day for the next 6 months. The patient will return to radiation oncology clinic for routine followup in one month. I advised the patient to call or return sooner if they have any questions or concerns related to their recovery or treatment. ________________________________  Jodelle Gross, MD, PhD  This document serves as a record of services personally performed by Kyung Rudd, MD. It was created on his behalf by Rae Lips, a trained medical scribe. The creation of this record is based on the scribe's personal observations and the provider's statements to them. This document has been checked and approved by the attending provider.

## 2017-01-26 ENCOUNTER — Inpatient Hospital Stay: Payer: Medicare Other | Attending: Hematology

## 2017-01-26 ENCOUNTER — Encounter: Payer: Self-pay | Admitting: Nurse Practitioner

## 2017-01-26 ENCOUNTER — Inpatient Hospital Stay (HOSPITAL_BASED_OUTPATIENT_CLINIC_OR_DEPARTMENT_OTHER): Payer: Medicare Other | Admitting: Nurse Practitioner

## 2017-01-26 VITALS — BP 160/97 | HR 75 | Temp 97.5°F | Resp 18 | Ht 66.0 in | Wt 209.5 lb

## 2017-01-26 DIAGNOSIS — Z86718 Personal history of other venous thrombosis and embolism: Secondary | ICD-10-CM | POA: Diagnosis not present

## 2017-01-26 DIAGNOSIS — I82431 Acute embolism and thrombosis of right popliteal vein: Secondary | ICD-10-CM

## 2017-01-26 DIAGNOSIS — N183 Chronic kidney disease, stage 3 (moderate): Secondary | ICD-10-CM

## 2017-01-26 DIAGNOSIS — Z9221 Personal history of antineoplastic chemotherapy: Secondary | ICD-10-CM | POA: Insufficient documentation

## 2017-01-26 DIAGNOSIS — C250 Malignant neoplasm of head of pancreas: Secondary | ICD-10-CM | POA: Diagnosis not present

## 2017-01-26 DIAGNOSIS — Z923 Personal history of irradiation: Secondary | ICD-10-CM | POA: Insufficient documentation

## 2017-01-26 DIAGNOSIS — C61 Malignant neoplasm of prostate: Secondary | ICD-10-CM

## 2017-01-26 DIAGNOSIS — D630 Anemia in neoplastic disease: Secondary | ICD-10-CM

## 2017-01-26 LAB — CBC WITH DIFFERENTIAL/PLATELET
BASOS PCT: 0 %
Basophils Absolute: 0 10*3/uL (ref 0.0–0.1)
EOS ABS: 0.3 10*3/uL (ref 0.0–0.5)
Eosinophils Relative: 5 %
HCT: 32.8 % — ABNORMAL LOW (ref 38.4–49.9)
HEMOGLOBIN: 10.2 g/dL — AB (ref 13.0–17.1)
Lymphocytes Relative: 31 %
Lymphs Abs: 1.5 10*3/uL (ref 0.9–3.3)
MCH: 27.6 pg (ref 27.2–33.4)
MCHC: 31.1 g/dL — AB (ref 32.0–36.0)
MCV: 88.9 fL (ref 79.3–98.0)
Monocytes Absolute: 0.7 10*3/uL (ref 0.1–0.9)
Monocytes Relative: 14 %
NEUTROS PCT: 50 %
Neutro Abs: 2.4 10*3/uL (ref 1.5–6.5)
Platelets: 290 10*3/uL (ref 140–400)
RBC: 3.69 MIL/uL — AB (ref 4.20–5.82)
RDW: 16.4 % — ABNORMAL HIGH (ref 11.0–15.6)
WBC: 4.9 10*3/uL (ref 4.0–10.3)

## 2017-01-26 LAB — COMPREHENSIVE METABOLIC PANEL
ALBUMIN: 2.9 g/dL — AB (ref 3.5–5.0)
ALK PHOS: 101 U/L (ref 40–150)
ALT: 15 U/L (ref 0–55)
AST: 18 U/L (ref 5–34)
Anion gap: 7 (ref 3–11)
BUN: 34 mg/dL — ABNORMAL HIGH (ref 7–26)
CALCIUM: 9.8 mg/dL (ref 8.4–10.4)
CO2: 20 mmol/L — AB (ref 22–29)
Chloride: 112 mmol/L — ABNORMAL HIGH (ref 98–109)
Creatinine, Ser: 2.62 mg/dL — ABNORMAL HIGH (ref 0.70–1.30)
GFR calc Af Amer: 26 mL/min — ABNORMAL LOW (ref >60)
GFR calc non Af Amer: 23 mL/min — ABNORMAL LOW (ref >60)
GLUCOSE: 104 mg/dL (ref 70–140)
Potassium: 4.6 mmol/L (ref 3.5–5.1)
SODIUM: 139 mmol/L (ref 136–145)
Total Bilirubin: <0.2 mg/dL — ABNORMAL LOW (ref 0.2–1.2)
Total Protein: 8.2 g/dL (ref 6.4–8.3)

## 2017-01-26 NOTE — Progress Notes (Addendum)
[place]  [phone] [phone]  Clinic Follow up Note   Patient Care Team: Patient, No Pcp Per as PCP - General ([place]) Stark Klein, MD as Consulting Physician (General Surgery) Truitt Merle, MD as Consulting Physician (Hematology) Kyung Rudd, MD as Consulting Physician (Radiation Oncology) 01/26/2017  CHIEF COMPLAINT: Follow up pancreas cancer  SUMMARY OF ONCOLOGIC HISTORY: Oncology History   Cancer Staging Pancreatic cancer [facility]) Staging form: Exocrine Pancreas, AJCC 8th Edition - Clinical: Stage IA (cT1c, cN0, cM0) - Signed by Truitt Merle, MD on 09/14/2016       Malignant neoplasm of prostate ([place])   06/27/2016 Initial Biopsy    Diagnosis 06/27/16 1. Urethra, biopsy, bulb KERATINIZED SQUAMOUS CELL METAPLASIA AND HYPERPLASIA 2. Ureter, biopsy, right orifice METASTATIC PROSTATIC ADENOCARCINOMA, GLEASON SCORE 5+5=10 INVOLVES 50% OF THE SPECIMEN 3. Urethra, biopsy, prostatic polyp PROSTATIC ADENOCARCINOMA, GLEASON SCORE 5+4=9 INVOLVES 30% OF THE RESECTED TISSUE 4. Bladder, biopsy, posterior METASTATIC PROSTATIC ADENOCARCINOMA, INVOLVING DETRUSOR MUSCLE BENIGN UROTHELIUM WITH POLYPOID CYSTITIS 5. Prostate, chips PROSTATIC ADENOCARCINOMA, GLEASON SCORE 5+5=10 INVOLVES 80% OF THE RESECTED TISSUE  ADDENDUM: Immunohistochemistry is attempted on the cell block and there is limited tumor present. The scant tumor present is negative with prostein and prostate specific antigen. Called to Dr. Burr Medico and Dr. Ardis Hughs on 09/17/16. (JDP:gt, 09/17/16)      07/25/2016 Initial Diagnosis    Malignant neoplasm of prostate ([place])      09/19/2016 Imaging    CT Chest IMPRESSION: 1. Several small solid pulmonary nodules scattered in both lungs, largest 6 mm, indeterminate but more likely benign given predominantly subpleural location of the nodules. Recommend initial follow-up chest CT in 3 months. 2. No thoracic adenopathy or other potential  findings of metastatic disease in the chest. 3. Right upper lobe 1.1 cm ground-glass pulmonary nodule. Initial follow-up with CT at 6-12 months is recommended to confirm persistence. If persistent, repeat CT is recommended every 2 years until 5 years of stability has been established. This recommendation follows the consensus statement: Guidelines for Management of Incidental Pulmonary Nodules Detected on CT Images: From the Fleischner Society 2017; Radiology 2017; 284:228-243. 4. Mild cardiomegaly. Three-vessel coronary atherosclerosis status post CABG. 5. Multinodular goiter with dominant 3.7 cm right thyroid lobe nodule, for which thyroid ultrasound correlation is warranted, which may be performed if clinically warranted. 6. Re- demonstration of diffuse pancreatic duct dilation due to known pancreatic malignancy.        Pancreatic cancer ([place])   07/08/2016 Imaging    NM Bone Scan Whole Body 07/08/16 IMPRESSION: There are no findings suspicious for metastatic disease to the skeleton.      07/28/2016 Imaging    MR Abd w/wo contrast 07/28/16 IMPRESSION: 1. There is a suspicious lesion involving the head of pancreas and obstructing the pancreatic duct is identified. Involvement of the portal vein and portal venous confluence is suspected. Findings worrisome for pancreatic adenocarcinoma. Further investigation with endoscopic ultrasound and tissue sampling advise. 2. Resolution of bilateral hydronephrosis. Bilateral kidney cysts noted. 3. Aortic atherosclerosis.      08/28/2016 Procedure    EUS by Dr. Ardis Hughs 08/28/16 IMPRESSION:  1.9cm by 1.5cm mass in the head of pancreas causing main pancreatic duct obstruction and directly abutting the portal vein (suggesting invasion). The mass is not obstructing the bile duct. Preliminary reading of FNA shows "very atypical cells."        08/29/2016 Initial Biopsy    Diagnosis 08/29/16 FINE NEEDLE ASPIRATION, ENDOSCOPIC, PANCREAS  HEAD(SPECIMEN 1 OF 1 COLLECTED  08/28/16): POORLY DIFFERENTIATED CARCINOMA ASSOCIATED WITH ACUTE INFLAMMATION. SEE COMMENT. Comment: There are a few cells with features suggestive of adenocarcinoma.      09/12/2016 Initial Diagnosis    Pancreatic cancer ([place])      09/19/2016 Imaging    CT Chest WO Contrast 09/19/16 IMPRESSION: 1. Several small solid pulmonary nodules scattered in both lungs, largest 6 mm, indeterminate but more likely benign given predominantly subpleural location of the nodules. Recommend initial follow-up chest CT in 3 months. 2. No thoracic adenopathy or other potential findings of metastatic disease in the chest. 3. Right upper lobe 1.1 cm ground-glass pulmonary nodule. Initial follow-up with CT at 6-12 months is recommended to confirm persistence. If persistent, repeat CT is recommended every 2 years until 5 years of stability has been established. This recommendation follows the consensus statement: Guidelines for Management of Incidental Pulmonary Nodules Detected on CT Images: From the Fleischner Society 2017; Radiology 2017; 284:228-243. 4. Mild cardiomegaly. Three-vessel coronary atherosclerosis status post CABG. 5. Multinodular goiter with dominant 3.7 cm right thyroid lobe nodule, for which thyroid ultrasound correlation is warranted, which may be performed if clinically warranted. 6. Re- demonstration of diffuse pancreatic duct dilation due to known pancreatic malignancy.      09/26/2016 - 11/11/2016 Chemotherapy    neoadjuvant Gemcitabine and Abraxane on day 1, 8 every 3 weeks started 09/26/16, will hold Abraxane for first 1-2 doses, reduced gemcitabine dosed to 800 mg/m       11/25/2016 Imaging    MR ABDOMEN WO CONTRAST IMPRESSION: 1. Persistent small mass-like area of fullness in the pancreatic head, grossly stable in size on today's noncontrast examination measuring approximately 2.5 cm, with persistent pancreatic ductal dilatation distal to this  lesion. Findings remain concerning for underlying neoplasm. No lymphadenopathy or definite signs of metastatic disease in the abdomen on today's noncontrast Examination.  CT CHEST WO CONTRAST IMPRESSION: Scattered bilateral pulmonary nodules measuring up to 6 mm, grossly unchanged, favoring a benign etiology given the subpleural location. However, given only two month stability, additional follow-up CT chest is suggested in 4-6 months. (A short time interval is unlikely to be definitive.)  6 mm ground-glass nodule in the lateral right upper lobe, less conspicuous than on the prior study. Attention at the time of follow-up is suggested.  Aortic Atherosclerosis (ICD10-I70.0) and Emphysema (ICD10-J43.9).       12/31/2016 - 01/09/2017 Radiation Therapy    S/p SBRT to pancreas per Dr. Lisbeth Renshaw; 33 Gy in 5 fractions     CURRENT THERAPY:  1. neoadjuvant Gemcitabine and Abraxane on day 1, 8 every 21 days, Gemcitabine reduced dose to 800 mg/m^2, Held Abraxane for first 2 doses started 09/26/16; abraxane only given with cycle 1 day 15, tolerated poorly; patient cancelled cycle 2 day 8 chemo due to scheduling conflict; last chemo 02/72/53 2. Completed SBRT to pancreas per Dr. Lisbeth Renshaw 12/19, 12/21, 12/24, 12/26, 12/28   INTERVAL HISTORY: Mr. Manalang returns for follow-up as scheduled.  He had EUS with fiducials [facility] at [facility] on 12/16/2016, completed SBRT per Dr. Lisbeth Renshaw from [phone] in 5 fractions.  He tolerated well.  He was told to hold Eliquis for fiducial placement and has not restarted.  Denies swelling in his legs.  Denies abdominal pain, nausea, vomiting, constipation, diarrhea, or bloating.  No recent fever or chills, appetite and energy level are good. He notes if he needs surgery for pancreatic cancer he wants to go [facility].   REVIEW OF SYSTEMS:   Constitutional: Denies fatigue, fevers, chills or  abnormal weight loss Eyes: Denies blurriness of vision Ears, nose, mouth, throat, and  face: Denies mucositis or sore throat Respiratory: Denies cough, dyspnea or wheezes Cardiovascular: Denies palpitation, chest discomfort or lower extremity swelling Gastrointestinal:  Denies nausea, vomiting, constipation, diarrhea, heartburn, loading, distention, or change in bowel habits Skin: Denies abnormal skin rashes Lymphatics: Denies new lymphadenopathy or easy bruising Neurological:Denies numbness, tingling or new weaknesses Behavioral/Psych: Mood is stable, no new changes  All other systems were reviewed with the patient and are negative.  MEDICAL HISTORY:  Past Medical History:  Diagnosis Date  . Acute MI, inferior wall ([place]) 1966  . Amputation finger    right first finger top portion age 69  . CHF (congestive heart failure) ([place])   . Coronary artery disease    severe 3 vessel   . Fall   . Foley catheter in place   . H/O hyperkalemia   . H/O thrombocytosis   . Hip fracture, left ([place])    11/2014  . History of blood transfusion   . History of metabolic acidosis   . History of tobacco abuse   . Hypertension   . Obstructive uropathy   . Pneumonia    childhood   . Prostate cancer ([place])   . S/P VSD repair    09/2009  . Trichomonas infection   . Urethral stricture due to infective diseases classified elsewhere   . Urinary tract infection     SURGICAL HISTORY: Past Surgical History:  Procedure Laterality Date  . CORONARY ARTERY BYPASS GRAFT     times 4  . CYSTOSCOPY W/ RETROGRADES N/A 04/03/2015   Procedure:  BLADDER BIOPSIES;  Surgeon: Festus Aloe, MD;  Location: [facility];  Service: Urology;  Laterality: N/A;  . CYSTOSCOPY W/ RETROGRADES Bilateral 06/27/2016   Procedure: CYSTOSCOPY WITH BILATERAL RETROGRADES;  Surgeon: Festus Aloe, MD;  Location: [facility];  Service: Urology;  Laterality: Bilateral;  . CYSTOSCOPY WITH BIOPSY N/A 06/27/2016   Procedure: CYSTOSCOPY WITH BLADDER BIOPSY URETHRAL BIOPSY;  Surgeon: Festus Aloe, MD;  Location: [facility];  Service:  Urology;  Laterality: N/A;  . CYSTOSCOPY WITH URETHRAL DILATATION N/A 04/03/2015   Procedure: CYSTOSCOPY WITH BILATERAL RETROGRADES;  Surgeon: Festus Aloe, MD;  Location: [facility];  Service: Urology;  Laterality: N/A;  . EUS N/A 08/28/2016   Procedure: UPPER ENDOSCOPIC ULTRASOUND (EUS) RADIAL;  Surgeon: Milus Banister, MD;  Location: [facility];  Service: Endoscopy;  Laterality: N/A;  . flexible cystoscopy    . repair of post infarction posterior ventricular septal defect    . TRANSURETHRAL RESECTION OF PROSTATE  06/27/2016   Procedure: TRANSURETHRAL RESECTION OF THE PROSTATE (TURP);  Surgeon: Festus Aloe, MD;  Location: [facility];  Service: Urology;;    I have reviewed the social history and family history with the patient and they are unchanged from previous note.  ALLERGIES:  is allergic to chlorhexidine gluconate.  MEDICATIONS:  Current Outpatient Medications  Medication Sig Dispense Refill  . amLODipine (NORVASC) 10 MG tablet take 1/2 tablet by mouth once daily 30 tablet 11  . carvedilol (COREG) 12.5 MG tablet take 1 tablet by mouth twice a day with meals **KEEP OFFICE VISIT** 30 tablet 10  . rosuvastatin (CRESTOR) 40 MG tablet take 1 tablet by mouth once daily **KEEP OFFICE VISIT** 30 tablet 10  . apixaban (ELIQUIS) 5 MG TABS tablet Take 1 tablet (5 mg total) 2 (two) times daily by mouth. Take 2 tablets twice daily for 7 days then take 1 tablet twice daily 70  tablet 1  . ondansetron (ZOFRAN) 8 MG tablet Take 1 tablet (8 mg total) by mouth 2 (two) times daily as needed (Nausea or vomiting). (Patient not taking: Reported on 11/26/2016) 30 tablet 1  . prochlorperazine (COMPAZINE) 10 MG tablet Take 1 tablet (10 mg total) by mouth every 6 (six) hours as needed (Nausea or vomiting). (Patient not taking: Reported on 11/26/2016) 30 tablet 1   No current facility-administered medications for this visit.    Facility-Administered Medications Ordered in Other Visits  Medication Dose  Route Frequency Provider Last Rate Last Dose  . ceFAZolin (ANCEF) IVPB 2g/100 mL premix  2 g Intravenous 30 min Pre-Op Festus Aloe, MD        PHYSICAL EXAMINATION: ECOG PERFORMANCE STATUS: 0 - Asymptomatic  Vitals:   01/26/17 1305  BP: (!) 160/97  Pulse: 75  Resp: 18  Temp: (!) 97.5 F (36.4 C)  SpO2: 100%   Filed Weights   01/26/17 1305  Weight: 209 lb 8 oz (95 kg)    GENERAL:alert, no distress and comfortable SKIN: skin color, texture, turgor are normal, no rashes or significant lesions EYES: normal, Conjunctiva are pink and non-injected, sclera clear OROPHARYNX:no exudate, no erythema and lips, buccal mucosa, and tongue normal  NECK: supple, thyroid normal size, non-tender, without nodularity LYMPH:  no palpable apical, supraclavicular, axillary, or inguinal lymphadenopathy LUNGS: clear to auscultation bilaterally with normal breathing effort HEART: regular rate & rhythm and no murmurs and (+) trace bilateral lower extremity edema ABDOMEN:abdomen soft, round, non-tender and normal bowel sounds.  No palpable hepatomegaly Musculoskeletal:no cyanosis of digits and no clubbing  NEURO: alert & oriented x 3 with fluent speech, no focal motor/sensory deficits  LABORATORY DATA:  I have reviewed the data as listed CBC Latest Ref Rng & Units 01/26/2017 11/26/2016 11/17/2016  WBC 4.0 - 10.3 K/uL 4.9 5.8 7.8  Hemoglobin 13.0 - 17.1 g/dL 10.2(L) 8.8(L) 8.5(L)  Hematocrit 38.4 - 49.9 % 32.8(L) 27.2(L) 26.8(L)  Platelets 140 - 400 K/uL 290 367 310    CMP Latest Ref Rng & Units 01/26/2017 11/26/2016 11/17/2016  Glucose 70 - 140 mg/dL 104 104 152(H)  BUN 7 - 26 mg/dL 34(H) 34.9(H) 40.0(H)  Creatinine 0.70 - 1.30 mg/dL 2.62(H) 2.3(H) 2.9(H)  Sodium 136 - 145 mmol/L 139 140 133(L)  Potassium 3.5 - 5.1 mmol/L 4.6 4.0 4.1  Chloride 98 - 109 mmol/L 112(H) - -  CO2 22 - 29 mmol/L 20(L) 20(L) 19(L)  Calcium 8.4 - 10.4 mg/dL 9.8 9.6 9.9  Total Protein 6.4 - 8.3 g/dL 8.2 7.8 8.9(H)    Total Bilirubin 0.2 - 1.2 mg/dL <0.2(L) <0.22 0.34  Alkaline Phos 40 - 150 U/L 101 100 97  AST 5 - 34 U/L 18 19 22   ALT 0 - 55 U/L 15 16 33   PATHOLOGY 08/28/16 Diagnosis FINE NEEDLE ASPIRATION, ENDOSCOPIC, PANCREAS HEAD(SPECIMEN 1 OF 1 COLLECTED 08/28/16): POORLY DIFFERENTIATED CARCINOMA ASSOCIATED WITH ACUTE INFLAMMATION. SEE COMMENT.  RADIOGRAPHIC STUDIES: I have personally reviewed the radiological images as listed and agreed with the findings in the report. No results found.   MR abdomen without contrast IMPRESSION: 11/25/2016 1. Persistent small mass-like area of fullness in the pancreatic head, grossly stable in size on today's noncontrast examination measuring approximately 2.5 cm, with persistent pancreatic ductal dilatation distal to this lesion. Findings remain concerning for underlying neoplasm. No lymphadenopathy or definite signs of metastatic disease in the abdomen on today's noncontrast Examination.  CT chest without contrast iMPRESSION: 11/25/2016 Scattered bilateral pulmonary nodules measuring up  to 6 mm, grossly unchanged, favoring a benign etiology given the subpleural location. However, given only two month stability, additional follow-up CT chest is suggested in 4-6 months. (A short time interval is unlikely to be definitive.)  6 mm ground-glass nodule in the lateral right upper lobe, less conspicuous than on the prior study. Attention at the time of follow-up is suggested.  Aortic Atherosclerosis (ICD10-I70.0) and Emphysema (ICD10-J43.9).   ASSESSMENT & PLAN: 72 y.o.African-American male, with past medical history of CAD, and recently diagnosed prostate cancer, gleason score 10, presented with incidental imaging finding of a pancreatic head mass.  1. Carcinoma of the head of the pancreas, cT1cN0M0,stage IA 2. CHF, HTN, CAD status post CABG 4 3. Newly diagnosed prostate cancer, Gleason score 10 4. CKD, stage III 5. Thyroid nodule 6. Lung  nodules 7. Lower extremity edema, right greater than left 8. Acute deep vein thrombosis in the right proximal-mid peroneal veins; superficial vein thrombosis noted in left greater saphenous vein, left femoral artery nearly occluded  Mr. Loeper appears stable today without specific complaints. He completed single agent gemcitabine from 09/26/16 - 11/11/16; he received gem/abraxane with cycle 1 day 15 and did not tolerate well. S/p SBRT in 5 fractions from 12/19 - 01/09/17; he tolerated well. He will f/u with rad onc 02/16/17. No LE edema today, but we discussed the risk of recurrent DVT with pancreatic cancer; I recommend he restart eliquis. He wants to call his provider at [facility] to see if he can restart. His weight increased after chemotherapy, I encouraged him to eat well and be active as tolerated. Hgb improved after chemo, now 10.2 (previously 8.8 on 11/26/16), CBC otherwise unremarkable. Cmet stable; creatinine 2.62 stable overall with known stage III CKD. He is clinically doing well, will continue supportive care and observation for now.   We again reviewed chemotherapy and radiation will not cure his disease as surgery is the only potentially curative option. If he has surgery he wishes to have done at [facility] in [facility]. He declines assistance making the referral at this time, he states he has a provider currently helping him out with this. If he decides to have surgery referral, will obtain restaging scan in approx 6 weeks (end of 02/2017), he will let us know if he wishes to proceed with imaging. Either way, will see him back with lab and f/u with Dr. Burr Medico in 6 weeks.  PLAN -Restart Eliquis 1 tab BID; he does not need refill today -f/u Rad onc 02/2017 -lab and f/u with Dr. Burr Medico in 6 weeks -Patient will let us know if we can assist with surgery referral to [facility] -patient will call if he wishes to proceed with restaging scan, if so will be done end of 02/2017  All questions were answered. The patient  knows to call the clinic with any problems, questions or concerns. No barriers to learning was detected.     Alla Feeling, NP 01/26/17   Addendum  I have seen the patient, examined him. I agree with the assessment and and plan and have edited the notes.   Mr Hoffert is clinically doing well, has completed a course of radiation.  Due to his poor tolerance to chemotherapy previously, I do not recommend more chemotherapy at this point.  Patient is interested in pursuing a second opinion, and has contact a Psychologist, sport and exercise at the [facility]. He will f/u with rad/onc in early Feb, and I will plan to see her back in 6 weeks.  Truitt Merle  01/26/2017  

## 2017-01-27 LAB — CANCER ANTIGEN 19-9: CAN 19-9: 23 U/mL (ref 0–35)

## 2017-02-16 ENCOUNTER — Other Ambulatory Visit: Payer: Self-pay

## 2017-02-16 ENCOUNTER — Encounter: Payer: Self-pay | Admitting: Radiation Oncology

## 2017-02-16 ENCOUNTER — Ambulatory Visit
Admission: RE | Admit: 2017-02-16 | Discharge: 2017-02-16 | Disposition: A | Discharge status: Home / Self Care | Payer: Medicare Other | Source: Ambulatory Visit | Attending: Radiation Oncology | Admitting: Radiation Oncology

## 2017-02-16 VITALS — BP 167/90 | HR 78 | Temp 98.3°F | Resp 20 | Ht 66.0 in | Wt 215.0 lb

## 2017-02-16 DIAGNOSIS — Z923 Personal history of irradiation: Secondary | ICD-10-CM | POA: Insufficient documentation

## 2017-02-16 DIAGNOSIS — Z08 Encounter for follow-up examination after completed treatment for malignant neoplasm: Secondary | ICD-10-CM | POA: Insufficient documentation

## 2017-02-16 DIAGNOSIS — Z79899 Other long term (current) drug therapy: Secondary | ICD-10-CM | POA: Insufficient documentation

## 2017-02-16 DIAGNOSIS — C259 Malignant neoplasm of pancreas, unspecified: Secondary | ICD-10-CM | POA: Diagnosis not present

## 2017-02-16 DIAGNOSIS — C250 Malignant neoplasm of head of pancreas: Secondary | ICD-10-CM

## 2017-02-18 ENCOUNTER — Other Ambulatory Visit: Payer: Self-pay | Admitting: Radiation Oncology

## 2017-02-18 DIAGNOSIS — C250 Malignant neoplasm of head of pancreas: Secondary | ICD-10-CM

## 2017-02-18 NOTE — Progress Notes (Signed)
Radiation Oncology         [phone] ________________________________  Name: [name] MRN: [id number]  Date of Service: 02/16/2017 DOB: 1945/04/23  Post Treatment Note  CC: Patient, No Pcp Per  Raynelle Bring, MD  Diagnosis:    Stage IA, cT1cN0M0 adenocarcinoma of the pancreas  Interval Since Last Radiation:  6  weeks   12/30/2016-01/09/2017 SBRT Treatment:  The pancreas was treated to 33 Gy in 5 fractions of 6.6 Gy using the SBRT/SRT-3D technique.   Narrative:  The patient returns today for routine follow-up. In summary this is a pleasant gentleman who was being worked up for a high risk prostate cancer who was incidentally found to have a mass in the head of the pancreaswhich was consistent with a stage IA adenocarcinoma on biopsy. He began lupron with plans to proceed with radiotherapy with Dr. Tammi Klippel once his pancreas cancer was treated. He was a borderline surgical candidate, and elected for SBRT. I believe he was technically a candidate for whipple, however. He underwent SBRT style treatment to this site and completed therapy about 6 weeks ago. He questions whether or not he should still consider surgery. He does not have any appointments scheduled for imaging or follow up with Dr. Barry Dienes.                            On review of systems, the patient states he's feeling great. He denies any abdominal pain, nausea, fevers, chills, chest pain, or shortness of breath. No other complaints are noted.   ALLERGIES:  is allergic to chlorhexidine gluconate.  Meds: Current Outpatient Medications  Medication Sig Dispense Refill  . amLODipine (NORVASC) 10 MG tablet take 1/2 tablet by mouth once daily 30 tablet 11  . carvedilol (COREG) 12.5 MG tablet take 1 tablet by mouth twice a day with meals **KEEP OFFICE VISIT** 30 tablet 10  . rosuvastatin (CRESTOR) 40 MG tablet take 1 tablet by mouth once daily **KEEP OFFICE VISIT** 30 tablet 10  . apixaban (ELIQUIS) 5 MG TABS tablet Take 1  tablet (5 mg total) 2 (two) times daily by mouth. Take 2 tablets twice daily for 7 days then take 1 tablet twice daily (Patient not taking: Reported on 02/16/2017) 70 tablet 1  . ondansetron (ZOFRAN) 8 MG tablet Take 1 tablet (8 mg total) by mouth 2 (two) times daily as needed (Nausea or vomiting). (Patient not taking: Reported on 11/26/2016) 30 tablet 1  . prochlorperazine (COMPAZINE) 10 MG tablet Take 1 tablet (10 mg total) by mouth every 6 (six) hours as needed (Nausea or vomiting). (Patient not taking: Reported on 11/26/2016) 30 tablet 1   No current facility-administered medications for this encounter.    Facility-Administered Medications Ordered in Other Encounters  Medication Dose Route Frequency Provider Last Rate Last Dose  . ceFAZolin (ANCEF) IVPB 2g/100 mL premix  2 g Intravenous 30 min Pre-Op Festus Aloe, MD        Physical Findings:  height is 5\' 6"  (1.676 m) and weight is 215 lb (97.5 kg). His oral temperature is 98.3 F (36.8 C). His blood pressure is 167/90 (abnormal) and his pulse is 78. His respiration is 20 and oxygen saturation is 100%.  Pain Assessment Pain Score: 0-No pain/10 In general this is a somewhat frail African American male in no acute distress. He's alert and oriented x4 and appropriate throughout the examination. Cardiopulmonary assessment is negative for acute distress and he exhibits normal effort.  Lab Findings: Lab Results  Component Value Date   WBC 4.9 01/26/2017   HGB 10.2 (L) 01/26/2017   HCT 32.8 (L) 01/26/2017   MCV 88.9 01/26/2017   PLT 290 01/26/2017     Radiographic Findings: No results found.  Impression/Plan: 1.  Stage IA, cT1cN0M0 adenocarcinoma of the pancreas. The patient tolerated radiotherapy well. He is awaiting recommendations for or against surgery. Initially he declined surgical resection, and at this time requests additional recommendations from Dr. Barry Dienes and/or second opinion at [facility]. I have reached out to Dr. Barry Dienes  to determine if there is a need for surgical resection. He has not had imaging to determine response, and I'll order imaging at Dr. Marlowe Aschoff recommendations, and make sure he has continued follow up with Dr. Burr Medico. 2.         High risk, stage IV, pT4N1 adenocarcinoma of the prostate with a PSA of 17.5 and a Gleason score of 5+5.  The patient is still in need of continuation of Lupron, which he received in November 2018, and he is due for his next injection in May 2019. We will determine a plan for definitive radiotherapy upon confirming completion of his treatment for pancreas cancer.      Carola Rhine, PAC

## 2017-02-18 NOTE — Progress Notes (Signed)
I called and left a message for the patient letting him know that we would get a CT scan with pancreatic protocol and set him up to see Dr. Barry Dienes to review.

## 2017-02-19 ENCOUNTER — Telehealth: Payer: Self-pay | Admitting: *Deleted

## 2017-02-19 NOTE — Telephone Encounter (Signed)
Called patient to inform of CT for 02-20-17 - arrival time - 9:15 am , pt. to have water only - 4 hrs. Prior to test, test to be @ [facility], patient to see Dr. Barry Dienes on 02-23-17 @ 3 pm, spoke with patient's son- Donald, Walsh. and he is aware of these appts. and is good with these appts.

## 2017-02-20 ENCOUNTER — Other Ambulatory Visit: Payer: Self-pay | Admitting: Radiation Oncology

## 2017-02-20 ENCOUNTER — Ambulatory Visit (HOSPITAL_COMMUNITY)
Admission: RE | Admit: 2017-02-20 | Discharge: 2017-02-20 | Disposition: A | Discharge status: Home / Self Care | Payer: Medicare Other | Source: Ambulatory Visit | Attending: Radiation Oncology | Admitting: Radiation Oncology

## 2017-02-20 DIAGNOSIS — C259 Malignant neoplasm of pancreas, unspecified: Secondary | ICD-10-CM | POA: Diagnosis not present

## 2017-02-20 DIAGNOSIS — N281 Cyst of kidney, acquired: Secondary | ICD-10-CM | POA: Diagnosis not present

## 2017-02-20 DIAGNOSIS — I2510 Atherosclerotic heart disease of native coronary artery without angina pectoris: Secondary | ICD-10-CM | POA: Insufficient documentation

## 2017-02-20 DIAGNOSIS — C250 Malignant neoplasm of head of pancreas: Secondary | ICD-10-CM

## 2017-02-20 DIAGNOSIS — I700 Atherosclerosis of aorta: Secondary | ICD-10-CM | POA: Diagnosis not present

## 2017-02-23 ENCOUNTER — Ambulatory Visit (HOSPITAL_COMMUNITY): Payer: Medicare Other

## 2017-03-03 NOTE — Progress Notes (Signed)
Called triage nurse at [facility] to let them know that patient was not aware of his appointment with Dr. Barry Dienes on 02/23/17.  I asked if [facility] could call patient to reschedule.

## 2017-03-03 NOTE — Progress Notes (Signed)
  Oncology Nurse Navigator Documentation  Navigator Location: [facility] (03/03/17 1336)   )Navigator Encounter Type: Telephone (03/03/17 1336) Telephone: Lahoma Crocker Call;Incoming Call (03/03/17 1336)    Called patient and spoke with him. Patient wiiling to come in for f/u with Dr. Audie Box NP. I will send a scheduling message.                     Education: Other(Requesting that patient call back for follow-up appointment.) (03/03/17 1336) Interventions: Other(Left VM asking patient to call me back.) (03/03/17 1336)            Acuity: Level 1 (03/03/17 1336)         Time Spent with Patient: 15 (03/03/17 1336)

## 2017-03-04 ENCOUNTER — Other Ambulatory Visit: Payer: Self-pay | Admitting: Cardiology

## 2017-03-04 ENCOUNTER — Encounter: Payer: Self-pay | Admitting: *Deleted

## 2017-03-11 ENCOUNTER — Telehealth: Payer: Self-pay

## 2017-03-11 ENCOUNTER — Other Ambulatory Visit: Payer: Self-pay

## 2017-03-11 ENCOUNTER — Ambulatory Visit: Payer: Self-pay | Admitting: Hematology

## 2017-03-11 ENCOUNTER — Other Ambulatory Visit: Payer: Self-pay | Admitting: *Deleted

## 2017-03-11 ENCOUNTER — Telehealth: Payer: Self-pay | Admitting: Cardiology

## 2017-03-11 ENCOUNTER — Telehealth: Payer: Self-pay | Admitting: General Practice

## 2017-03-11 ENCOUNTER — Telehealth: Payer: Self-pay | Admitting: *Deleted

## 2017-03-11 DIAGNOSIS — I255 Ischemic cardiomyopathy: Secondary | ICD-10-CM

## 2017-03-11 DIAGNOSIS — Z01810 Encounter for preprocedural cardiovascular examination: Secondary | ICD-10-CM

## 2017-03-11 NOTE — Telephone Encounter (Signed)
New Message   Pt states he is suppose to have an echo done to see if his heart can withstand pancreas surgery, but no order in epic. Please call

## 2017-03-11 NOTE — Telephone Encounter (Signed)
Called patient and LVM for patient to call back and schedule his echo and myoview.

## 2017-03-11 NOTE — Telephone Encounter (Signed)
Rescheduled patient appointment needed am instead. Per 2/27 phone message

## 2017-03-11 NOTE — Telephone Encounter (Signed)
Arrange echo and lexiscan nuclear study as previously outlined Kirk Ruths

## 2017-03-11 NOTE — Telephone Encounter (Signed)
Message has been sent to admin to schedule.

## 2017-03-11 NOTE — Telephone Encounter (Signed)
Pt called & states that he can't make appt today & wishes to r/s.  Transferred to [employer].

## 2017-03-11 NOTE — Telephone Encounter (Signed)
[facility] CSW Progress Notes  Call from patient - states he wants to cancel appt today.  Did not give reason.  Routed call to schedulers and also informed desk nurse.  Edwyna Shell, LCSW Clinical Social Worker Phone:  [phone]

## 2017-03-11 NOTE — Telephone Encounter (Signed)
Returned call to patient, patient states he needs echo in order to proceed with surgery on his pancreas.  States he has been doing chemo treatments therefore has not had this done yet.    Last OV 09/2016 with Dr. Stanford Breed: 6 preoperative evaluation prior to Whipple-patient has limited functional capacity. I will arrange a [place] nuclear study for risk stratification preoperatively. If low risk for no ischemia he may proceed. Echocardiogram to re-quantify LV function.   7 pancreatic cancer-management per oncology.   Patient would like these test completed now.   Advised I would route to Dr. Stanford Breed to see if he needs an additional follow up appointment since it has been 5 months.  Also will send message to admin to schedule testing.

## 2017-03-13 ENCOUNTER — Telehealth: Payer: Self-pay | Admitting: *Deleted

## 2017-03-13 NOTE — Progress Notes (Signed)
[facility]  [phone] Fax:(336) [phone]  Clinic Follow-up  Note   Patient Care Team: Patient, No Pcp Per as PCP - General ([place]) Stark Klein, MD as Consulting Physician (General Surgery) Truitt Merle, MD as Consulting Physician (Hematology) Kyung Rudd, MD as Consulting Physician (Radiation Oncology) 03/17/2017   CHIEF COMPLAINTS:  Follow-up pancreatic cancer  Oncology History   Cancer Staging Pancreatic cancer [facility]) Staging form: Exocrine Pancreas, AJCC 8th Edition - Clinical: Stage IA (cT1c, cN0, cM0) - Signed by Truitt Merle, MD on 09/14/2016       Malignant neoplasm of prostate ([place])   06/27/2016 Initial Biopsy    Diagnosis 06/27/16 1. Urethra, biopsy, bulb KERATINIZED SQUAMOUS CELL METAPLASIA AND HYPERPLASIA 2. Ureter, biopsy, right orifice METASTATIC PROSTATIC ADENOCARCINOMA, GLEASON SCORE 5+5=10 INVOLVES 50% OF THE SPECIMEN 3. Urethra, biopsy, prostatic polyp PROSTATIC ADENOCARCINOMA, GLEASON SCORE 5+4=9 INVOLVES 30% OF THE RESECTED TISSUE 4. Bladder, biopsy, posterior METASTATIC PROSTATIC ADENOCARCINOMA, INVOLVING DETRUSOR MUSCLE BENIGN UROTHELIUM WITH POLYPOID CYSTITIS 5. Prostate, chips PROSTATIC ADENOCARCINOMA, GLEASON SCORE 5+5=10 INVOLVES 80% OF THE RESECTED TISSUE  ADDENDUM: Immunohistochemistry is attempted on the cell block and there is limited tumor present. The scant tumor present is negative with prostein and prostate specific antigen. Called to Dr. Burr Medico and Dr. Ardis Hughs on 09/17/16. (JDP:gt, 09/17/16)      07/25/2016 Initial Diagnosis    Malignant neoplasm of prostate ([place])      09/19/2016 Imaging    CT Chest IMPRESSION: 1. Several small solid pulmonary nodules scattered in both lungs, largest 6 mm, indeterminate but more likely benign given predominantly subpleural location of the nodules. Recommend initial follow-up chest CT in 3 months. 2. No thoracic adenopathy or other potential findings of metastatic disease  in the chest. 3. Right upper lobe 1.1 cm ground-glass pulmonary nodule. Initial follow-up with CT at 6-12 months is recommended to confirm persistence. If persistent, repeat CT is recommended every 2 years until 5 years of stability has been established. This recommendation follows the consensus statement: Guidelines for Management of Incidental Pulmonary Nodules Detected on CT Images: From the Fleischner Society 2017; Radiology 2017; 284:228-243. 4. Mild cardiomegaly. Three-vessel coronary atherosclerosis status post CABG. 5. Multinodular goiter with dominant 3.7 cm right thyroid lobe nodule, for which thyroid ultrasound correlation is warranted, which may be performed if clinically warranted. 6. Re- demonstration of diffuse pancreatic duct dilation due to known pancreatic malignancy.        Pancreatic cancer ([place])   07/08/2016 Imaging    NM Bone Scan Whole Body 07/08/16 IMPRESSION: There are no findings suspicious for metastatic disease to the skeleton.      07/28/2016 Imaging    MR Abd w/wo contrast 07/28/16 IMPRESSION: 1. There is a suspicious lesion involving the head of pancreas and obstructing the pancreatic duct is identified. Involvement of the portal vein and portal venous confluence is suspected. Findings worrisome for pancreatic adenocarcinoma. Further investigation with endoscopic ultrasound and tissue sampling advise. 2. Resolution of bilateral hydronephrosis. Bilateral kidney cysts noted. 3. Aortic atherosclerosis.      08/28/2016 Procedure    EUS by Dr. Ardis Hughs 08/28/16 IMPRESSION:  1.9cm by 1.5cm mass in the head of pancreas causing main pancreatic duct obstruction and directly abutting the portal vein (suggesting invasion). The mass is not obstructing the bile duct. Preliminary reading of FNA shows "very atypical cells."        08/29/2016 Initial Biopsy    Diagnosis 08/29/16 FINE NEEDLE ASPIRATION, ENDOSCOPIC, PANCREAS HEAD(SPECIMEN 1 OF 1 COLLECTED  08/28/16): POORLY DIFFERENTIATED  CARCINOMA ASSOCIATED WITH ACUTE INFLAMMATION. SEE COMMENT. Comment: There are a few cells with features suggestive of adenocarcinoma.      09/12/2016 Initial Diagnosis    Pancreatic cancer ([place])      09/19/2016 Imaging    CT Chest WO Contrast 09/19/16 IMPRESSION: 1. Several small solid pulmonary nodules scattered in both lungs, largest 6 mm, indeterminate but more likely benign given predominantly subpleural location of the nodules. Recommend initial follow-up chest CT in 3 months. 2. No thoracic adenopathy or other potential findings of metastatic disease in the chest. 3. Right upper lobe 1.1 cm ground-glass pulmonary nodule. Initial follow-up with CT at 6-12 months is recommended to confirm persistence. If persistent, repeat CT is recommended every 2 years until 5 years of stability has been established. This recommendation follows the consensus statement: Guidelines for Management of Incidental Pulmonary Nodules Detected on CT Images: From the Fleischner Society 2017; Radiology 2017; 284:228-243. 4. Mild cardiomegaly. Three-vessel coronary atherosclerosis status post CABG. 5. Multinodular goiter with dominant 3.7 cm right thyroid lobe nodule, for which thyroid ultrasound correlation is warranted, which may be performed if clinically warranted. 6. Re- demonstration of diffuse pancreatic duct dilation due to known pancreatic malignancy.      09/26/2016 - 11/11/2016 Chemotherapy    neoadjuvant Gemcitabine on day 1, 8 every 3 weeks started 09/26/16, added Abraxane on cycle 2 day 1, chemo stopped after 2 cycles due to poor tolerance.       11/25/2016 Imaging    MR ABDOMEN WO CONTRAST IMPRESSION: 1. Persistent small mass-like area of fullness in the pancreatic head, grossly stable in size on today's noncontrast examination measuring approximately 2.5 cm, with persistent pancreatic ductal dilatation distal to this lesion. Findings remain concerning  for underlying neoplasm. No lymphadenopathy or definite signs of metastatic disease in the abdomen on today's noncontrast Examination.  CT CHEST WO CONTRAST IMPRESSION: Scattered bilateral pulmonary nodules measuring up to 6 mm, grossly unchanged, favoring a benign etiology given the subpleural location. However, given only two month stability, additional follow-up CT chest is suggested in 4-6 months. (A short time interval is unlikely to be definitive.)  6 mm ground-glass nodule in the lateral right upper lobe, less conspicuous than on the prior study. Attention at the time of follow-up is suggested.  Aortic Atherosclerosis (ICD10-I70.0) and Emphysema (ICD10-J43.9).       12/31/2016 - 01/09/2017 Radiation Therapy    S/p SBRT to pancreas per Dr. Lisbeth Renshaw; 33 Gy in 5 fractions      02/20/2017 Imaging    CT Abdomen WO Contrast 02/20/17 IMPRESSION: 1. Fiducials in the vicinity of the prior pancreatic mass. There is some continued low-density in the pancreatic tail with expected dilatation of the dorsal pancreatic duct extending to the level of the mass. The mass itself is difficult to differentiate from the surrounding pancreatic parenchyma on today's noncontrast CT. The thickness of the pancreatic contour in the vicinity of the mass is 1.8 cm on image 30/2 (not including the SMV), which appears stable from the prior MRI. This is considered only a rough indicator of stability of the size of the mass. The mass may be closely approximated to the SMV but the celiac trunk and superior mesenteric artery do not appear involved at this time. 2. Other imaging findings of potential clinical significance: Aortic Atherosclerosis (ICD10-I70.0). Coronary atherosclerosis. Calcification along the inferior myocardial wall and pericardium. Prior median sternotomy. Contrast medium in the distal esophagus compatible with dysmotility or reflux. Bilateral renal cysts of varying complexity. Continued  diffuse  wall thickening in the collecting systems of both kidneys. Multilevel lumbar impingement.       HISTORY OF PRESENTING ILLNESS: 09/12/16 [name] 72 y.o. male is here because of pancreatic cancer. Approximately one year ago, pt was admitted for urinary retention. Dr Junious Silk met him on service in the hospital. Since then he has been followed by closely [facility] for ongoing BPH with urinary retention. He did have a foley catheter in place for 3-4 months ago until approximately one month ago when this was removed. Pt had a DRE in 06/2016 which showed some induration, he underwent TURP with prostate biopsy following this on 06/27/2016. His pathology revealed Gleason 5+5 disease in all TUR chips with local invasion into the bladder detrusor muscle. He had a PSA performed on 07/11/16 which was 17.5. Pt recently had consult with radiation oncology and he has been taking injections since. He has not been tolerating any other treatments for his prostate cancer. He also had a CT A/p and NM Bone scan performed on 07/11/16. His bone scan was negative for any osseous metastatic abnormalities; however, his CT scan showed a single pathologically enlarged pelvic lymph node as well as dilation of the main pancreatic duct without discrete pancreatic mass. Subsequently he underwent MR A/p on 07/28/16 which showed a suspicious lesion involving the head of the pancreas and obstructed the pancreatic duct. Involvement of the portal vein and portal venous confluence was suspected, per radiology. Overall, pt is a poor historian.   He was subsequently referred by Dr Ardis Hughs to medical oncology for further management. He is not currently followed by a PCP. He denies abdominal pain, diarrhea, constipation, unexpected weight loss, loss of appetite, chest pain, shortness of breath, fatigue. He is otherwise asymptomatic and feeling at his baseline today. Of note, he states that he did not take his prescribed BP  medications this morning, 09/12/16.   He stopped smoking ~13 years ago, and is only an occasional drinker. He is currently retired from [employer]. He is currently single and does have five children, two of which are his step-sons. He states that he lives in an apartment here in [place] with his two youngest sons, ages 42 and 30yo.   CURRENT THERAPY: Pending surgery  INTERVAL HISTORY:  KEESHAWN FAKHOURI is here for a follow up. He presents to the clinic today by himself.  He states he feels well, denies any significant pain, abdominal discomfort, nausea or other complaints.  His appetite and energy level  decent.  No other complaints.  He states that he has a cardiology appointment this Friday, and is waiting to see a cardiologist later this week, then will set up his  appointment with a general surgeon in [place].  He states his daughter-in-law is helping him to set up his appointment.    MEDICAL HISTORY:  Past Medical History:  Diagnosis Date  . Acute MI, inferior wall ([place]) 1966  . Amputation finger    right first finger top portion age 47  . CHF (congestive heart failure) ([place])   . Coronary artery disease    severe 3 vessel   . Fall   . Foley catheter in place   . H/O hyperkalemia   . H/O thrombocytosis   . Hip fracture, left ([place])    11/2014  . History of blood transfusion   . History of metabolic acidosis   . History of tobacco abuse   . Hypertension   . Obstructive uropathy   . Pneumonia  childhood   . Prostate cancer ([place])   . S/P VSD repair    09/2009  . Trichomonas infection   . Urethral stricture due to infective diseases classified elsewhere   . Urinary tract infection     SURGICAL HISTORY: Past Surgical History:  Procedure Laterality Date  . CORONARY ARTERY BYPASS GRAFT     times 4  . CYSTOSCOPY W/ RETROGRADES N/A 04/03/2015   Procedure:  BLADDER BIOPSIES;  Surgeon: Festus Aloe, MD;  Location: [facility];  Service: Urology;  Laterality: N/A;  .  CYSTOSCOPY W/ RETROGRADES Bilateral 06/27/2016   Procedure: CYSTOSCOPY WITH BILATERAL RETROGRADES;  Surgeon: Festus Aloe, MD;  Location: [facility];  Service: Urology;  Laterality: Bilateral;  . CYSTOSCOPY WITH BIOPSY N/A 06/27/2016   Procedure: CYSTOSCOPY WITH BLADDER BIOPSY URETHRAL BIOPSY;  Surgeon: Festus Aloe, MD;  Location: [facility];  Service: Urology;  Laterality: N/A;  . CYSTOSCOPY WITH URETHRAL DILATATION N/A 04/03/2015   Procedure: CYSTOSCOPY WITH BILATERAL RETROGRADES;  Surgeon: Festus Aloe, MD;  Location: [facility];  Service: Urology;  Laterality: N/A;  . EUS N/A 08/28/2016   Procedure: UPPER ENDOSCOPIC ULTRASOUND (EUS) RADIAL;  Surgeon: Milus Banister, MD;  Location: [facility];  Service: Endoscopy;  Laterality: N/A;  . flexible cystoscopy    . repair of post infarction posterior ventricular septal defect    . TRANSURETHRAL RESECTION OF PROSTATE  06/27/2016   Procedure: TRANSURETHRAL RESECTION OF THE PROSTATE (TURP);  Surgeon: Festus Aloe, MD;  Location: [facility];  Service: Urology;;    SOCIAL HISTORY: Social History   Socioeconomic History  . Marital status: Divorced    Spouse name: Not on file  . Number of children: Not on file  . Years of education: Not on file  . Highest education level: Not on file  Social Needs  . Financial resource strain: Not on file  . Food insecurity - worry: Not on file  . Food insecurity - inability: Not on file  . Transportation needs - medical: Not on file  . Transportation needs - non-medical: Not on file  Occupational History  . Not on file  Tobacco Use  . Smoking status: Former Smoker    Packs/day: 1.50    Years: 45.00    Pack years: 67.50    Types: Cigarettes    Last attempt to quit: 01/13/2010    Years since quitting: 7.1  . Smokeless tobacco: Never Used  Substance and Sexual Activity  . Alcohol use: No    Comment: occasional   . Drug use: No  . Sexual activity: Not on file  Other Topics Concern  . Not on file    Social History Narrative  . Not on file    FAMILY HISTORY: Family History  Problem Relation Age of Onset  . Cancer Paternal Aunt        unknown type cancer   . Cancer Paternal Uncle        unknown type cancer    ALLERGIES:  is allergic to chlorhexidine gluconate.  MEDICATIONS:  Current Outpatient Medications  Medication Sig Dispense Refill  . amLODipine (NORVASC) 10 MG tablet take 1/2 tablet by mouth once daily 30 tablet 11  . apixaban (ELIQUIS) 5 MG TABS tablet Take 1 tablet (5 mg total) 2 (two) times daily by mouth. Take 2 tablets twice daily for 7 days then take 1 tablet twice daily 70 tablet 1  . carvedilol (COREG) 12.5 MG tablet take 1 tablet by mouth twice a day with meals **KEEP OFFICE VISIT** 30  tablet 10  . rosuvastatin (CRESTOR) 40 MG tablet take 1 tablet by mouth once daily **KEEP OFFICE VISIT** 30 tablet 10  . ondansetron (ZOFRAN) 8 MG tablet Take 1 tablet (8 mg total) by mouth 2 (two) times daily as needed (Nausea or vomiting). (Patient not taking: Reported on 11/26/2016) 30 tablet 1  . prochlorperazine (COMPAZINE) 10 MG tablet Take 1 tablet (10 mg total) by mouth every 6 (six) hours as needed (Nausea or vomiting). (Patient not taking: Reported on 11/26/2016) 30 tablet 1   No current facility-administered medications for this visit.    Facility-Administered Medications Ordered in Other Visits  Medication Dose Route Frequency Provider Last Rate Last Dose  . ceFAZolin (ANCEF) IVPB 2g/100 mL premix  2 g Intravenous 30 min Pre-Op Festus Aloe, MD        REVIEW OF SYSTEMS:   Constitutional: Denies fevers, chills or abnormal night sweats Eyes: Denies blurriness of vision, double vision or watery eyes Ears, nose, mouth, throat, and face: Denies mucositis or sore throat Respiratory: Denies cough, dyspnea or wheezes (+) SOB Cardiovascular: Denies palpitation, chest discomfort (+) lower extremity swelling L>R, tightness and pain Gastrointestinal:  Denies nausea,  heartburn or change in bowel habits Skin: Denies abnormal skin rashes Lymphatics: Denies new lymphadenopathy or easy bruising Neurological:Denies numbness, tingling or new weaknesses Behavioral/Psych: Mood is stable, no new changes  All other systems were reviewed with the patient and are negative.  PHYSICAL EXAMINATION:  ECOG PERFORMANCE STATUS: 1 BP 137/88 (BP Location: Left Arm, Patient Position: Sitting)   Pulse 73   Temp (!) 97.5 F (36.4 C) (Oral)   Resp 19   Ht '5\' 6"'$  (1.676 m)   Wt 216 lb 8 oz (98.2 kg)   SpO2 100%   BMI 34.94 kg/m  GENERAL:alert, no distress and comfortable SKIN: skin color, texture, turgor are normal, no rashes or significant lesions EYES: normal, conjunctiva are pink and non-injected, sclera clear OROPHARYNX:no exudate, no erythema and lips, buccal mucosa, and tongue normal  NECK: supple, thyroid normal size, non-tender, without nodularity LYMPH:  no palpable lymphadenopathy in the cervical, axillary or inguinal LUNGS: clear to auscultation and percussion with normal breathing effort HEART: regular rate & rhythm and no murmurs and no lower extremity edema ABDOMEN:abdomen soft, non-tender and normal bowel sounds Musculoskeletal:no cyanosis of digits and no clubbing  PSYCH: alert & oriented x 3 with fluent speech NEURO: no focal motor/sensory deficits   LABORATORY DATA:  I have reviewed the data as listed CBC Latest Ref Rng & Units 03/17/2017 01/26/2017 11/26/2016  WBC 4.0 - 10.3 K/uL 5.1 4.9 5.8  Hemoglobin 13.0 - 17.1 g/dL 10.8(L) 10.2(L) 8.8(L)  Hematocrit 38.4 - 49.9 % 34.1(L) 32.8(L) 27.2(L)  Platelets 140 - 400 K/uL 272 290 367   CMP Latest Ref Rng & Units 03/17/2017 01/26/2017 11/26/2016  Glucose 70 - 140 mg/dL 105 104 104  BUN 7 - 26 mg/dL 41(H) 34(H) 34.9(H)  Creatinine 0.70 - 1.30 mg/dL 3.20(HH) 2.62(H) 2.3(H)  Sodium 136 - 145 mmol/L 139 139 140  Potassium 3.5 - 5.1 mmol/L 4.2 4.6 4.0  Chloride 98 - 109 mmol/L 112(H) 112(H) -  CO2 22 - 29  mmol/L 20(L) 20(L) 20(L)  Calcium 8.4 - 10.4 mg/dL 9.8 9.8 9.6  Total Protein 6.4 - 8.3 g/dL 8.1 8.2 7.8  Total Bilirubin 0.2 - 1.2 mg/dL 0.3 <0.2(L) <0.22  Alkaline Phos 40 - 150 U/L 123 101 100  AST 5 - 34 U/L '11 18 19  '$ ALT 0 - 55 U/L 7  15 16   CT Abdomen WO Contrast 02/20/17 IMPRESSION: 1. Fiducials in the vicinity of the prior pancreatic mass. There is some continued low-density in the pancreatic tail with expected dilatation of the dorsal pancreatic duct extending to the level of the mass. The mass itself is difficult to differentiate from the surrounding pancreatic parenchyma on today's noncontrast CT. The thickness of the pancreatic contour in the vicinity of the mass is 1.8 cm on image 30/2 (not including the SMV), which appears stable from the prior MRI. This is considered only a rough indicator of stability of the size of the mass. The mass may be closely approximated to the SMV but the celiac trunk and superior mesenteric artery do not appear involved at this time. 2. Other imaging findings of potential clinical significance: Aortic Atherosclerosis (ICD10-I70.0). Coronary atherosclerosis. Calcification along the inferior myocardial wall and pericardium. Prior median sternotomy. Contrast medium in the distal esophagus compatible with dysmotility or reflux. Bilateral renal cysts of varying complexity. Continued diffuse wall thickening in the collecting systems of both kidneys. Multilevel lumbar impingement.  MR abdomen without contrast IMPRESSION: 11/25/2016 1. Persistent small mass-like area of fullness in the pancreatic head, grossly stable in size on today's noncontrast examination measuring approximately 2.5 cm, with persistent pancreatic ductal dilatation distal to this lesion. Findings remain concerning for underlying neoplasm. No lymphadenopathy or definite signs of metastatic disease in the abdomen on today's noncontrast Examination.  CT chest without contrast  iMPRESSION: 11/25/2016 Scattered bilateral pulmonary nodules measuring up to 6 mm, grossly unchanged, favoring a benign etiology given the subpleural location. However, given only two month stability, additional follow-up CT chest is suggested in 4-6 months. (A short time interval is unlikely to be definitive.)  6 mm ground-glass nodule in the lateral right upper lobe, less conspicuous than on the prior study. Attention at the time of follow-up is suggested.  Aortic Atherosclerosis (ICD10-I70.0) and Emphysema (ICD10-J43.9).   ASSESSMENT: 72 y.o. African-American male, with past medical history of CAD, and recently diagnosed prostate cancer, gleason score 10, and pancreatic cancer on neoadjuvant chemo, presented with b/l low extremities edema    1. Carcinoma of the head of the pancreas, cT1cN0M0,stage IA -I previously reviewed his CT and MRI scan findings, which showed a mass in the pancreatic head, with probable portal vein involvement. The mass measures about 1.9 cm by endoscopy ultrasound, with ultrasound evidence of portal vein invasion. -I previously reviewed his surgical path findings with pt in great details. He did have UEUS on 08/28/16 by Dr Ardis Hughs, biopsy was performed during this which revealed a poorly differentiated carcinoma of the head of the pancreas. Due to his recent prostate cancer, I requested additional IHC, which was negative so prostates metastasis was ruled out  -I informed him that due to the nature and location of his tumor that he would need Whipple surgery to resect it.  -I have consulted Dr. Barry Dienes and we discussed his case in our GI tumor board. According to the ultrasound and MRI, his pancreatic mass appears to be borderline resectable, and will require neoadjuvant chemotherapy and/or radiation. -He tried single agent gemcitabine from 09/26/16 - 11/11/16, then changed to gem/abraxane but did not tolerate well.  Chemotherapy was subsequently stopped. -He is S/p  SBRT in 5 fractions from 12/19 - 01/09/17; he tolerated well. He will continue to f/u with rad onc. -If he has surgery he wishes to have done at [facility] I encouraged him to see surgeon ASAP. He declines assistance from my  office making the referral at this time, he states his daughter-in-law is helping to set up his appointment with surgeon. -Restaging CT abdomen without contrast on February 20, 2017 showed stable disease (limited image due to the lack of contrast), no evidence of metastasis on the noncontrast CT. -Lab and follow-up in 2 months.  2. CHF, HTN, CAD status post CABG 4 -He is currently followed by Dr Stanford Breed of Cardiology for ongoing management of his cardiac issues.  -He is currently on Amlodipine, Crestor, and Crestor, but has some issues with compliance.  -his last echo showed EF 45% in 2014, will repeat his echo before chemo  -He will contact his PCP for refills  3. Newly diagnosed prostate cancer, Gleason score 10 -He presented with urinary obstruction, seen by urologist Dr. Junious Silk, and prostate biopsy showed prostate cancer, Gleason score 10, which is very high risk -his staging CT abdomen and pelvis and a bone scan showed no other evidence of metastasis, except the pancreatic mass -he has started ADT, plan to have prostate seed placement by Dr. Tammi Klippel, which has been held since his pancreatic cancer diagnosis  -I previously discussed his case with Dr. Tammi Klippel, he agreed treatment of pancreatic cancer is the priority over prostate cancer treatment will coordinate his care with Dr. Tammi Klippel  -I will update Dr. Tammi Klippel about his status, to see if he will proceed with prostate cancer radiation, while he is waiting for pancreatic surgery.  4. CKD, stage III -f/u PCP, avoid IV contrast and other nephrotoxic -Slightly worsening kidney function on lab today, I encouraged him to follow-up with his nephrologist.   5. Thyroid nodule -likely benign growth -will  monitor   6. Lung nodules -previously discussed CT results with patient in detail -he is asymptomatic -I previously discussed this is likely related to smoking history and likely benign -will monitor  7. Acute deep vein thrombosis in the right proximal-mid peroneal veins; superficial vein thrombosis noted in left greater saphenous vein, left femoral artery nearly occluded -LE Doppler from 11/05/16 showed acute thrombosis in the right calf, and acute superficial thrombosis of the left great saphenous vein -His DVT is probably caused by his underlying malignancy and chemotherapy -continue on Eliquis '5mg'$  BID   PLAN:  -Patient states he will set up appointment with a surgeon in [facility] to discuss pancreatic surgery, he declined assistance from my office -Lab and follow-up in 2 months. -I also called his son after his visit. He concurred his findings plan to see a surgeon in [place], and will call me if they need assistance from my office.   All questions were answered. The patient knows to call the clinic with any problems, questions or concerns. I spent 20 minutes counseling the patient face to face. The total time spent in the appointment was 25 minutes and more than 50% was on counseling.   Truitt Merle, MD 03/17/2017 4:57 PM

## 2017-03-13 NOTE — Telephone Encounter (Signed)
Call from [facility] LPN with message from [place]- pt missed 2/11 apopt with Dr. Barry Dienes, r/s for 3/11.\ [facility] unable to reach pt. Attempt to reach pt on home ph:[phone]. Line only rings, no answer. Called [phone] spoke with April (daughter in law) who advised pt didn't have an appt calendar. Discussed pt's upcoming appt date and time with April who states " I put it in my calendar on my phone"  Gave April [facility] phone # encouraged her to call for additional information regarding pt's f/u with Dr. Barry Dienes on 3/11. April advised she is usually the one who goes to all of his appts but she has been busy recently. April did not have any information on if pt has gone to [facility] for 2nd opinion or if he will do so in the future. No further concerns. Called [facility] gave alternate #'s for pt to be contacted.

## 2017-03-17 ENCOUNTER — Telehealth (HOSPITAL_COMMUNITY): Payer: Self-pay | Admitting: *Deleted

## 2017-03-17 ENCOUNTER — Telehealth (HOSPITAL_COMMUNITY): Payer: Self-pay

## 2017-03-17 ENCOUNTER — Encounter: Payer: Self-pay | Admitting: Hematology

## 2017-03-17 ENCOUNTER — Inpatient Hospital Stay: Payer: Medicare Other | Attending: Hematology

## 2017-03-17 ENCOUNTER — Inpatient Hospital Stay (HOSPITAL_BASED_OUTPATIENT_CLINIC_OR_DEPARTMENT_OTHER): Payer: Medicare Other | Admitting: Hematology

## 2017-03-17 VITALS — BP 137/88 | HR 73 | Temp 97.5°F | Resp 19 | Ht 66.0 in | Wt 216.5 lb

## 2017-03-17 DIAGNOSIS — I509 Heart failure, unspecified: Secondary | ICD-10-CM | POA: Diagnosis not present

## 2017-03-17 DIAGNOSIS — I130 Hypertensive heart and chronic kidney disease with heart failure and stage 1 through stage 4 chronic kidney disease, or unspecified chronic kidney disease: Secondary | ICD-10-CM

## 2017-03-17 DIAGNOSIS — N183 Chronic kidney disease, stage 3 (moderate): Secondary | ICD-10-CM | POA: Diagnosis not present

## 2017-03-17 DIAGNOSIS — Z923 Personal history of irradiation: Secondary | ICD-10-CM | POA: Insufficient documentation

## 2017-03-17 DIAGNOSIS — I10 Essential (primary) hypertension: Secondary | ICD-10-CM

## 2017-03-17 DIAGNOSIS — C250 Malignant neoplasm of head of pancreas: Secondary | ICD-10-CM

## 2017-03-17 DIAGNOSIS — Z86718 Personal history of other venous thrombosis and embolism: Secondary | ICD-10-CM | POA: Diagnosis not present

## 2017-03-17 DIAGNOSIS — I2510 Atherosclerotic heart disease of native coronary artery without angina pectoris: Secondary | ICD-10-CM | POA: Diagnosis not present

## 2017-03-17 DIAGNOSIS — Z951 Presence of aortocoronary bypass graft: Secondary | ICD-10-CM

## 2017-03-17 DIAGNOSIS — R918 Other nonspecific abnormal finding of lung field: Secondary | ICD-10-CM

## 2017-03-17 DIAGNOSIS — C61 Malignant neoplasm of prostate: Secondary | ICD-10-CM | POA: Diagnosis not present

## 2017-03-17 DIAGNOSIS — Z9221 Personal history of antineoplastic chemotherapy: Secondary | ICD-10-CM

## 2017-03-17 DIAGNOSIS — F17201 Nicotine dependence, unspecified, in remission: Secondary | ICD-10-CM | POA: Insufficient documentation

## 2017-03-17 DIAGNOSIS — I25810 Atherosclerosis of coronary artery bypass graft(s) without angina pectoris: Secondary | ICD-10-CM

## 2017-03-17 DIAGNOSIS — E041 Nontoxic single thyroid nodule: Secondary | ICD-10-CM

## 2017-03-17 DIAGNOSIS — Z7901 Long term (current) use of anticoagulants: Secondary | ICD-10-CM | POA: Diagnosis not present

## 2017-03-17 LAB — CBC WITH DIFFERENTIAL/PLATELET
BASOS ABS: 0 10*3/uL (ref 0.0–0.1)
Basophils Relative: 0 %
EOS ABS: 0.1 10*3/uL (ref 0.0–0.5)
EOS PCT: 3 %
HCT: 34.1 % — ABNORMAL LOW (ref 38.4–49.9)
Hemoglobin: 10.8 g/dL — ABNORMAL LOW (ref 13.0–17.1)
Lymphocytes Relative: 30 %
Lymphs Abs: 1.5 10*3/uL (ref 0.9–3.3)
MCH: 27.2 pg (ref 27.2–33.4)
MCHC: 31.7 g/dL — ABNORMAL LOW (ref 32.0–36.0)
MCV: 85.9 fL (ref 79.3–98.0)
Monocytes Absolute: 1.1 10*3/uL — ABNORMAL HIGH (ref 0.1–0.9)
Monocytes Relative: 21 %
Neutro Abs: 2.4 10*3/uL (ref 1.5–6.5)
Neutrophils Relative %: 46 %
PLATELETS: 272 10*3/uL (ref 140–400)
RBC: 3.97 MIL/uL — AB (ref 4.20–5.82)
RDW: 15.4 % — ABNORMAL HIGH (ref 11.0–14.6)
WBC: 5.1 10*3/uL (ref 4.0–10.3)

## 2017-03-17 LAB — COMPREHENSIVE METABOLIC PANEL
ALT: 7 U/L (ref 0–55)
AST: 11 U/L (ref 5–34)
Albumin: 2.9 g/dL — ABNORMAL LOW (ref 3.5–5.0)
Alkaline Phosphatase: 123 U/L (ref 40–150)
Anion gap: 7 (ref 3–11)
BUN: 41 mg/dL — AB (ref 7–26)
CHLORIDE: 112 mmol/L — AB (ref 98–109)
CO2: 20 mmol/L — ABNORMAL LOW (ref 22–29)
CREATININE: 3.2 mg/dL — AB (ref 0.70–1.30)
Calcium: 9.8 mg/dL (ref 8.4–10.4)
GFR calc non Af Amer: 18 mL/min — ABNORMAL LOW (ref >60)
GFR, EST AFRICAN AMERICAN: 21 mL/min — AB (ref >60)
Glucose, Bld: 105 mg/dL (ref 70–140)
POTASSIUM: 4.2 mmol/L (ref 3.5–5.1)
SODIUM: 139 mmol/L (ref 136–145)
Total Bilirubin: 0.3 mg/dL (ref 0.2–1.2)
Total Protein: 8.1 g/dL (ref 6.4–8.3)

## 2017-03-17 NOTE — Telephone Encounter (Signed)
Patient given detailed instructions per Myocardial Perfusion Study Information Sheet for the test on 03/20/2017 at 0930. Patient notified to arrive 15 minutes early and that it is imperative to arrive on time for appointment to keep from having the test rescheduled.  If you need to cancel or reschedule your appointment, please call the office within 24 hours of your appointment. . Patient verbalized understanding. TMY

## 2017-03-17 NOTE — Telephone Encounter (Signed)
Left message on voicemail in reference to upcoming appointment scheduled for 03/20/17. Phone number given for a call back so details instructions can be given.  Kirstie Peri

## 2017-03-18 ENCOUNTER — Encounter: Payer: Self-pay | Admitting: Radiation Oncology

## 2017-03-18 ENCOUNTER — Telehealth: Payer: Self-pay | Admitting: Hematology

## 2017-03-18 LAB — CANCER ANTIGEN 19-9: CA 19-9: 22 U/mL (ref 0–35)

## 2017-03-18 NOTE — Telephone Encounter (Signed)
Appointments scheduled AVS/Calendar/Letter mailed to patient per 3/5 los

## 2017-03-20 ENCOUNTER — Encounter (HOSPITAL_COMMUNITY): Payer: Self-pay

## 2017-03-20 ENCOUNTER — Ambulatory Visit (HOSPITAL_COMMUNITY): Payer: Medicare Other | Attending: Cardiology

## 2017-03-25 ENCOUNTER — Encounter (HOSPITAL_COMMUNITY): Payer: Self-pay | Admitting: Radiology

## 2017-04-06 ENCOUNTER — Encounter: Payer: Self-pay | Admitting: *Deleted

## 2017-04-06 ENCOUNTER — Ambulatory Visit
Admission: RE | Admit: 2017-04-06 | Discharge: 2017-04-06 | Disposition: A | Discharge status: Home / Self Care | Payer: Medicare Other | Source: Ambulatory Visit | Attending: Radiation Oncology | Admitting: Radiation Oncology

## 2017-04-06 ENCOUNTER — Ambulatory Visit: Payer: Medicare Other

## 2017-04-06 NOTE — Progress Notes (Unsigned)
[place] and cell phone numbers no answer unable to leave a message.  Called son Kody Brandl. emergency contact and his father was there he forgot his appointment this morning asked to have it rescheduled for a later date.  Shona Simpson, PA-C made aware of the situation.  Contacted scheduling spoke with Threasa Beards to re-schdule the appointment.

## 2017-04-09 ENCOUNTER — Institutional Professional Consult (permissible substitution): Payer: Self-pay | Admitting: Urology

## 2017-04-10 ENCOUNTER — Ambulatory Visit: Payer: Medicare Other

## 2017-04-10 ENCOUNTER — Institutional Professional Consult (permissible substitution): Payer: Medicare Other | Admitting: Radiation Oncology

## 2017-04-10 ENCOUNTER — Encounter: Payer: Self-pay | Admitting: *Deleted

## 2017-04-10 ENCOUNTER — Inpatient Hospital Stay: Admission: RE | Admit: 2017-04-10 | Payer: Medicare Other | Source: Ambulatory Visit | Admitting: Radiation Oncology

## 2017-04-10 ENCOUNTER — Ambulatory Visit
Admission: RE | Admit: 2017-04-10 | Discharge: 2017-04-10 | Disposition: A | Discharge status: Home / Self Care | Payer: Medicare Other | Source: Ambulatory Visit | Attending: Radiation Oncology | Admitting: Radiation Oncology

## 2017-04-10 DIAGNOSIS — C61 Malignant neoplasm of prostate: Secondary | ICD-10-CM

## 2017-04-10 NOTE — Progress Notes (Signed)
[place] Mr. Yadav cell phone number no answer left a message that we were expecting him at 0900 this morning to meet with the nurse and 0930 with Dr. Tammi Klippel and his PA-C Ashlyn Bruning.  Please give Tonie a call at [phone]. [phone] Went upstairs looking for Mr. Thelen he still had not arrived. 0910 Called the home telephone spoke with a young child who said Mr. Quezada was not home. [phone] Called his son Esaiah Wanless. left a message on his answering machine that his father had not arrived for his 0900 appointment with the nurse and he has a 0930 with Dr, Tammi Klippel and his PA-C Ashlyn Bruning at 0930.  I left a message on his cell phone to call me. [place] PA-C made aware of the above situation.  She will still see him if he comes in this morning.

## 2017-04-14 ENCOUNTER — Telehealth: Payer: Self-pay | Admitting: *Deleted

## 2017-04-14 NOTE — Telephone Encounter (Signed)
CALLED PATIENT TO ASK ABOUT COMING IN FOR A RCON APPT. ON 04-27-17, PATIENT REFUSED APPT., PT. AGREED TO COME IN ON 04-30-17 @ 10 AM FOR THIS APPT.

## 2017-04-17 DIAGNOSIS — C61 Malignant neoplasm of prostate: Secondary | ICD-10-CM | POA: Diagnosis not present

## 2017-04-17 DIAGNOSIS — R3912 Poor urinary stream: Secondary | ICD-10-CM | POA: Diagnosis not present

## 2017-04-23 ENCOUNTER — Encounter: Payer: Self-pay | Admitting: Hematology

## 2017-04-23 NOTE — Progress Notes (Signed)
Patient called states he needs to schedule an appointment to talk about emergency assistance. Asked patient what he needed assistance with and he said he needs assistance to help get an apartment. Advised patient that he has the one-time $400 [employer] and gave him his remaining balance for the grant. Advised him that a statement or lease agreement would be needed to use the funds. He verbalized understanding and states he would call back once he has the information.

## 2017-04-30 ENCOUNTER — Encounter: Payer: Self-pay | Admitting: Urology

## 2017-04-30 ENCOUNTER — Ambulatory Visit
Admission: RE | Admit: 2017-04-30 | Discharge: 2017-04-30 | Disposition: A | Discharge status: Home / Self Care | Payer: Medicare Other | Source: Ambulatory Visit | Attending: Radiation Oncology | Admitting: Radiation Oncology

## 2017-04-30 ENCOUNTER — Other Ambulatory Visit: Payer: Self-pay

## 2017-04-30 ENCOUNTER — Telehealth: Payer: Self-pay | Admitting: General Practice

## 2017-04-30 ENCOUNTER — Ambulatory Visit
Admission: RE | Admit: 2017-04-30 | Discharge: 2017-04-30 | Disposition: A | Discharge status: Home / Self Care | Payer: Medicare Other | Source: Ambulatory Visit | Attending: Urology | Admitting: Urology

## 2017-04-30 ENCOUNTER — Encounter: Payer: Self-pay | Admitting: Radiation Oncology

## 2017-04-30 VITALS — BP 167/97 | HR 66 | Temp 98.1°F | Resp 20 | Ht 66.0 in | Wt 217.4 lb

## 2017-04-30 DIAGNOSIS — C61 Malignant neoplasm of prostate: Secondary | ICD-10-CM | POA: Insufficient documentation

## 2017-04-30 DIAGNOSIS — R972 Elevated prostate specific antigen [PSA]: Secondary | ICD-10-CM | POA: Diagnosis not present

## 2017-04-30 DIAGNOSIS — Z923 Personal history of irradiation: Secondary | ICD-10-CM | POA: Diagnosis not present

## 2017-04-30 DIAGNOSIS — Z8507 Personal history of malignant neoplasm of pancreas: Secondary | ICD-10-CM | POA: Diagnosis not present

## 2017-04-30 DIAGNOSIS — Z79818 Long term (current) use of other agents affecting estrogen receptors and estrogen levels: Secondary | ICD-10-CM | POA: Diagnosis not present

## 2017-04-30 NOTE — Progress Notes (Signed)
Cira Rue, RN was able to call over to [facility] to confirm patient's most recent PSA level from 04/17/17.  This was 0.57.   Nicholos Johns, PA-C

## 2017-04-30 NOTE — Progress Notes (Signed)
Radiation Oncology         [phone] ________________________________  Outpatient Radiation Oncology Re-Consultation  Name: [name] MRN: [id number]  Date: 04/30/2017  DOB: 02-20-1945  HL:[id number], No Pcp Per  Donald Merle, MD   REFERRING PHYSICIAN: Truitt Merle, MD  DIAGNOSIS: 72 y.o. gentleman with locally advanced adenocarcinoma of the prostate with a Gleason's score of 5+5 and a PSA of 17.5    ICD-10-CM   1. Malignant neoplasm of prostate ([place]) [name] is a 72 y.o. gentleman.  He is well established with [facility] due to BPH with urinary retention, previously followed by Dr. Janice Norrie and currently managed by Dr. Junious Silk.  He had a recurrent episode of AUR in 06/2016 and DRE at that time revealed some induration.  The patient proceeded with TURP with prostate biopsy on 06/27/2016.  Pathology returned Gleason 5+5 disease in all TUR chips with local invasion into the bladder detrusor muscle. PSA on 07/11/16 was 17.5.  He had CT and Bone scan on 07/08/16.  Bone scan was negative for any osseous metastatic disease.  CT scan showed a single pathologically enlarged pelvic lymph node as well as dilation of the main pancreatic duct without discrete pancreatic mass. He was seen in prostate [facility] 07/25/16 and was planning to proceed with 8 weeks of radiotherapy following ADT administration. He started ADT in the summer and due to the findings on the CT within the pancreas, he was counseled on holding off on radiation until his pancreas issue had been clarified. Repeat PSA 11/18/2016 was 0.95 on ADT.   An MRI abdomen on Monday 07/28/16 revealed a 2.8 x 2 cm mass within the head of the pancreas, obstructing pancreatic duct measuring 1.2 cm in diameter, the portal vein and portal venous confluence appears partially narrowed by the lesion as well.  No pathologically enlarged lymph nodes were identified. He met with GI and underwent EUS on 08/28/16 with a biopsy of his  pancreatic head which confirmed T2N0M0 adenocarcinoma of the pancreas.  He was considered borderline resectable, and proceeded with systemic therapy under the care of Dr. Burr Medico with neoadjuvant Gemcitabine on day 1 and 8 every 3 weeks started 09/26/16, added Abraxane on cycle 2 day 1 but chemo was stopped after 2 cycles due to poor tolerance. He had repeat imaging of the abdomen with MRI 11/25/16 which confirmed persistent 2.5 cm mass in the head of the pancreas, no peripancreatic inflammatory changes or fluid was noted, and no additional adenopathy was present.  He was not felt to be an ideal surgical candidate and therefore elected to proceed with SBRT over a course of 5 fractions in 12/2016.  SBRT was completed 01/09/18 and he tolerated treatment well.  Restaging CT abdomen on 02/20/17 showed disease stability with no evidence of metastasis on the non-contrast CT.  He is considering meeting with a general surgeon at [facility] to discuss any potential role for surgical resection at this point but this has not yet been scheduled.  He presents today to further discuss radiotherapy in the treatment of his high risk prostate cancer.  He has remained on Lupron with his last injection in 04/17/2017 ('45mg'$ - 6 month injection). He was also restarted on Flomax at that visit and reports improved flow of stream and feels he is emptying his bladder more completely since resuming this medication.  PSA was drawn 04/17/17 but I do not currently have those results available for review.  We will  request a copy of his recent PSA prior to initiating treatment.  PREVIOUS RADIATION THERAPY: Yes- curative SBRT to the head of pancreas: 12/30/2016, 01/02/2017, 01/05/2017, 01/07/2017, 01/09/2017:   The pancreas was treated to 33 Gy in 5 fractions of 6.6 Gy using the SBRT/SRT-3D technique.  PAST MEDICAL HISTORY:  has a past medical history of Acute MI, inferior wall ([place]) (1966), Amputation finger, CHF (congestive heart failure) ([place]), Coronary  artery disease, Fall, Foley catheter in place, H/O hyperkalemia, H/O thrombocytosis, Hip fracture, left ([place]), History of blood transfusion, History of metabolic acidosis, History of tobacco abuse, Hypertension, Obstructive uropathy, Pneumonia, Prostate cancer ([place]), S/P VSD repair, Trichomonas infection, Urethral stricture due to infective diseases classified elsewhere, and Urinary tract infection.    PAST SURGICAL HISTORY: Past Surgical History:  Procedure Laterality Date  . CORONARY ARTERY BYPASS GRAFT     times 4  . CYSTOSCOPY W/ RETROGRADES N/A 04/03/2015   Procedure:  BLADDER BIOPSIES;  Surgeon: Festus Aloe, MD;  Location: [facility];  Service: Urology;  Laterality: N/A;  . CYSTOSCOPY W/ RETROGRADES Bilateral 06/27/2016   Procedure: CYSTOSCOPY WITH BILATERAL RETROGRADES;  Surgeon: Festus Aloe, MD;  Location: [facility];  Service: Urology;  Laterality: Bilateral;  . CYSTOSCOPY WITH BIOPSY N/A 06/27/2016   Procedure: CYSTOSCOPY WITH BLADDER BIOPSY URETHRAL BIOPSY;  Surgeon: Festus Aloe, MD;  Location: [facility];  Service: Urology;  Laterality: N/A;  . CYSTOSCOPY WITH URETHRAL DILATATION N/A 04/03/2015   Procedure: CYSTOSCOPY WITH BILATERAL RETROGRADES;  Surgeon: Festus Aloe, MD;  Location: [facility];  Service: Urology;  Laterality: N/A;  . EUS N/A 08/28/2016   Procedure: UPPER ENDOSCOPIC ULTRASOUND (EUS) RADIAL;  Surgeon: Milus Banister, MD;  Location: [facility];  Service: Endoscopy;  Laterality: N/A;  . flexible cystoscopy    . repair of post infarction posterior ventricular septal defect    . TRANSURETHRAL RESECTION OF PROSTATE  06/27/2016   Procedure: TRANSURETHRAL RESECTION OF THE PROSTATE (TURP);  Surgeon: Festus Aloe, MD;  Location: [facility];  Service: Urology;;    FAMILY HISTORY: family history includes Cancer in his paternal aunt and paternal uncle.  SOCIAL HISTORY:  reports that he quit smoking about 7 years ago. His smoking use included cigarettes. He has a 67.50  pack-year smoking history. He has never used smokeless tobacco. He reports that he does not drink alcohol or use drugs.  ALLERGIES: Chlorhexidine gluconate  MEDICATIONS:  Current Outpatient Medications  Medication Sig Dispense Refill  . amLODipine (NORVASC) 10 MG tablet take 1/2 tablet by mouth once daily 30 tablet 11  . carvedilol (COREG) 12.5 MG tablet take 1 tablet by mouth twice a day with meals **KEEP OFFICE VISIT** 30 tablet 10  . rosuvastatin (CRESTOR) 40 MG tablet take 1 tablet by mouth once daily **KEEP OFFICE VISIT** 30 tablet 10  . apixaban (ELIQUIS) 5 MG TABS tablet Take 1 tablet (5 mg total) 2 (two) times daily by mouth. Take 2 tablets twice daily for 7 days then take 1 tablet twice daily (Patient not taking: Reported on 04/30/2017) 70 tablet 1  . ondansetron (ZOFRAN) 8 MG tablet Take 1 tablet (8 mg total) by mouth 2 (two) times daily as needed (Nausea or vomiting). (Patient not taking: Reported on 11/26/2016) 30 tablet 1  . prochlorperazine (COMPAZINE) 10 MG tablet Take 1 tablet (10 mg total) by mouth every 6 (six) hours as needed (Nausea or vomiting). (Patient not taking: Reported on 11/26/2016) 30 tablet 1   No current facility-administered medications for this encounter.    Facility-Administered Medications  Ordered in Other Encounters  Medication Dose Route Frequency Provider Last Rate Last Dose  . ceFAZolin (ANCEF) IVPB 2g/100 mL premix  2 g Intravenous 30 min Pre-Op Festus Aloe, MD        REVIEW OF SYSTEMS:  On review of systems, the patient reports that he is doing well overall. He denies any chest pain, shortness of breath, cough, fevers, chills, night sweats, unintended weight changes. He denies any bowel disturbances, and denies abdominal pain, nausea or vomiting. He denies any new musculoskeletal or joint aches or pains. He has a history of severe bladder outlet obstruction with intermittent episodes of AUR.  He underwent TURP on 06/27/16 and has noted significant  improvement in his voiding since that time.  His current IPSS score is 9, indicating mild-moderate urinary symptoms.  He specifically denies dysuria, gross hematuria, excessive daytime frequency, urgency, incomplete bladder emptying or incontinence.  He reports nocturia x3 per night.  He is not currently sexually active. A complete review of systems is obtained and is otherwise negative.   PHYSICAL EXAM:  Wt Readings from Last 3 Encounters:  04/30/17 217 lb 6.4 oz (98.6 kg)  03/17/17 216 lb 8 oz (98.2 kg)  02/16/17 215 lb (97.5 kg)   Temp Readings from Last 3 Encounters:  04/30/17 98.1 F (36.7 C) (Oral)  03/17/17 (!) 97.5 F (36.4 C) (Oral)  02/16/17 98.3 F (36.8 C) (Oral)   BP Readings from Last 3 Encounters:  04/30/17 (!) 167/97  03/17/17 137/88  02/16/17 (!) 167/90   Pulse Readings from Last 3 Encounters:  04/30/17 66  03/17/17 73  02/16/17 78   Pain Assessment Pain Score: 0-No pain/10  In general this is a well appearing african [place] male in no acute distress. He is alert and oriented x4 and appropriate throughout the examination. HEENT reveals that the patient is normocephalic, atraumatic. EOMs are intact. PERRLA. Skin is intact without any evidence of gross lesions. Cardiovascular exam reveals a regular rate and rhythm, no clicks rubs or murmurs are auscultated. Chest is clear to auscultation bilaterally. Lymphatic assessment is performed and does not reveal any adenopathy in the cervical, supraclavicular, axillary, or inguinal chains. Abdomen has active bowel sounds in all quadrants and is intact. The abdomen is soft, non tender, non distended. Lower extremities are negative for pretibial pitting edema, deep calf tenderness, cyanosis or clubbing.  KPS = 90  100 - Normal; no complaints; no evidence of disease. 90   - Able to carry on normal activity; minor signs or symptoms of disease. 80   - Normal activity with effort; some signs or symptoms of disease. 82   - Cares  for self; unable to carry on normal activity or to do active work. 60   - Requires occasional assistance, but is able to care for most of his personal needs. 50   - Requires considerable assistance and frequent medical care. 60   - Disabled; requires special care and assistance. 14   - Severely disabled; hospital admission is indicated although death not imminent. 30   - Very sick; hospital admission necessary; active supportive treatment necessary. 10   - Moribund; fatal processes progressing rapidly. 0     - Dead  Karnofsky DA, Abelmann WH, Craver LS and Burchenal [facility] [phone]) The use of the nitrogen mustards in the palliative treatment of carcinoma: with particular reference to bronchogenic carcinoma Cancer 1 634-56   LABORATORY DATA:  Lab Results  Component Value Date   WBC 5.1 03/17/2017   HGB  10.8 (L) 03/17/2017   HCT 34.1 (L) 03/17/2017   MCV 85.9 03/17/2017   PLT 272 03/17/2017   Lab Results  Component Value Date   NA 139 03/17/2017   K 4.2 03/17/2017   CL 112 (H) 03/17/2017   CO2 20 (L) 03/17/2017   Lab Results  Component Value Date   ALT 7 03/17/2017   AST 11 03/17/2017   ALKPHOS 123 03/17/2017   BILITOT 0.3 03/17/2017     RADIOGRAPHY: No results found.    IMPRESSION/PLAN: 72 y.o. gentleman with a high risk, stage IV, pT4N1 adenocarcinoma of the prostate with a PSA of 17.5 and a Gleason score of 5+5.   [place], we discussed the patient's workup and outlined the nature of prostate cancer in this setting. The patient's T stage, Gleason's score, and PSA put him into the high risk group. Accordingly, he is eligible for a variety of potential treatment options including 8 weeks of external beam radiotherapy in combination with LT-ADT (2 years) or prostatectomy. We discussed the available radiation techniques, and focused on the details and logistics and delivery. The patient is not an ideal candidate for brachytherapy boost due to recent TURP procedure.  He started ADT in  08/12/2016 and has remained on Lupron since that time, with his most recent injection 04/17/2017.  We discussed and outlined the risks, benefits, short and long-term effects associated with radiotherapy and compared and contrasted these with prostatectomy. We also reviewed the role of ADT in the treatment of high risk prostate cancer and outlined the associated side effects that could be expected with this therapy. At the end of the conversation the patient is interested in moving forward with 8 weeks of external beam radiotherapy.  He has not had placement of gold fiducial markers. We will contact [facility] to make arrangements for fiducial marker placement prior to simulation.  We will also request a copy of his most recent PSA drawn on 04/17/17.  We will share our discussion with Dr. Junious Silk and move forward with scheduling placement of 3 gold fiducial markers into the prostate to proceed with IMRT in the near future.   We spent 60 minutes face to face with the patient and more than 50% of that time was spent in counseling and/or coordination of care.      Nicholos Johns, PA-C    Tyler Pita, MD  [facility] Direct Dial: [phone]  Fax: [phone] [email or web address]  Skype  LinkedIn

## 2017-04-30 NOTE — Telephone Encounter (Signed)
[place] CSW Progress Notes   Referral received from Dr Tammi Klippel - patient needs help w transportation.  CSW called patient at new cell phone - [phone].  Reviewed record, patient has active Medicaid which provides free transportation to all medical appointments both in and out of county.  Gave patient information on how to schedule this transportation, patient states he will arrange transport via [place] Medicaid transport [phone]).  Advised patient that he may need to complete brief assessment w his Medicaid worker and will need to schedule appts at least 72 hours in advance.  Patient voiced understanding.  Edwyna Shell, LCSW Clinical Social Worker Phone:  [phone]

## 2017-04-30 NOTE — Progress Notes (Signed)
GU Location of Tumor / Histology: Malignant Neoplasm of prostate  If Prostate Cancer, Gleason Score is (5 +5 = 10 ) and PSA is ( 17.50) 06-14-2016  [name] presented  months ago with signs/symptoms of: presented Jan 2017 with  urinary retention and underwent a TURP and bladder biopsy.     Biopsies of if applicable) revealed:  Malignant neoplasm of prostate  06/27/2016 Initial Biopsy     Diagnosis 06/27/16 1. Urethra, biopsy, bulb KERATINIZED SQUAMOUS CELL METAPLASIA AND HYPERPLASIA 2. Ureter, biopsy, right orifice METASTATIC PROSTATIC ADENOCARCINOMA, GLEASON SCORE 5+5=10 INVOLVES 50% OF THE SPECIMEN 3. Urethra, biopsy, prostatic polyp PROSTATIC ADENOCARCINOMA, GLEASON SCORE 5+4=9 INVOLVES 30% OF THE RESECTED TISSUE 4. Bladder, biopsy, posterior METASTATIC PROSTATIC ADENOCARCINOMA, INVOLVING DETRUSOR MUSCLE BENIGN UROTHELIUM WITH POLYPOID CYSTITIS 5. Prostate, chips PROSTATIC ADENOCARCINOMA, GLEASON SCORE 5+5=10 INVOLVES 80% OF THE RESECTED TISSUE  ADDENDUM: Immunohistochemistry is attempted on the cell block and there is limited tumor present. The scant tumor present is negative with prostein and prostate specific antigen. Called to Dr. Burr Medico and Dr. Ardis Hughs on 09/17/16. (JDP:gt, 09/17/16)                                                           Past/Anticipated interventions by urology, if any: Dr. Junious Silk             TURP, bladder biopsy, bone scan (negative), discussion of ADT and IMRT, referral to [facility], pancreas MRI 07-28-16                 Past/Anticipate interventions by medical oncology, if any: Dr. Burr Medico               CT Abdomen WO Contrast 02/20/17                    IMPRESSION:              1. Fiducials in the vicinity of the prior pancreatic mass. There is                  some continued low-density in the pancreatic tail with expected                  dilatation of the dorsal pancreatic duct extending to the level of                  the mass. The mass itself  is difficult to differentiate from the                  surrounding pancreatic parenchyma on today's noncontrast CT. The                  thickness of the pancreatic contour in the vicinity of the mass is                  1.8 cm on image 30/2 (not including the SMV), which appears stable                  from the prior MRI. This is considered only a rough indicator of                  stability of the size of the mass. The mass may be closely  approximated to the SMV but the celiac trunk and superior mesenteric                  artery do not appear involved at this time.               2. Other imaging findings of potential clinical significance: Aortic                   Atherosclerosis (ICD10-I70.0). Coronary atherosclerosis.                   Calcification along the inferior myocardial wall and pericardium.                   Prior median sternotomy. Contrast medium in the distal esophagus                   compatible with dysmotility or reflux. Bilateral renal cysts of                   varying complexity. Continued diffuse wall thickening in the                   collecting systems of both kidneys. Multilevel lumbar impingement.     09/19/2016 Imaging     CT Chest IMPRESSION: 1. Several small solid pulmonary nodules scattered in both lungs, largest 6 mm, indeterminate but more likely benign given predominantly subpleural location of the nodules. Recommend initial follow-up chest CT in 3 months. 2. No thoracic adenopathy or other potential findings of metastatic disease in the chest. 3. Right upper lobe 1.1 cm ground-glass pulmonary nodule. Initial follow-up with CT at 6-12 months is recommended to confirm persistence. If persistent, repeat CT is recommended every 2 years until 5 years of stability has been established. This recommendation follows the consensus statement: Guidelines for Management of Incidental Pulmonary Nodules Detected on CT Images: From  the Fleischner Society 2017; Radiology 2017; 284:228-243. 4. Mild cardiomegaly. Three-vessel coronary atherosclerosis status post CABG. 5. Multinodular goiter with dominant 3.7 cm right thyroid lobe nodule, for which thyroid ultrasound correlation is warranted, which may be performed if clinically warranted. 6. Re- demonstration of diffuse pancreatic duct dilation due to known pancreatic malignancy.   09/26/2016 - 11/11/2016 Chemotherapy     neoadjuvant Gemcitabine on day 1, 8 every 3 weeks started 09/26/16, added Abraxane on cycle 2 day 1, chemo stopped after 2 cycles due to poor tolerance.    07-08-16  Bone scan  NM Bone scan performed on 07/11/16. His bone scan was negative for any osseous metastatic abnormalities  06-15-18CYSTOSCOPY WITH BLADDER BIOPSY URETHRAL BIOPSY, CYSTOSCOPY WITH BILATERAL RETROGRADES, TRANSURETHRAL RESECTION OF THE PROSTATE (TURP)              Weight changes, if any:  Wt Readings from Last 3 Encounters:  04/30/17 217 lb 6.4 oz (98.6 kg)  03/17/17 216 lb 8 oz (98.2 kg)  02/16/17 215 lb (97.5 kg)    Bowel/Bladder complaints, if any: IPSS 5 ED  Nausea/Vomiting, if any: No  Pain issues, if any:  No  SAFETY ISSUES: Prior radiation? :  Pancreas  12-30-16, 01-02-17,01-05-17, 01-07-17,01-09-17   Pacemaker/ICD? : No  Possible current pregnancy? : N/A  Is the patient on methotrexate? : No  Current Complaints / other details:  72 year old male. Retired. Divorced.  BP (!) 167/97 (BP Location: Right Arm, Patient Position: Sitting, Cuff Size: Large)   Pulse 66   Temp 98.1 F (36.7 C) (Oral)  Resp 20   Ht 5\' 6"  (1.676 m)   Wt 217 lb 6.4 oz (98.6 kg)   SpO2 99%   BMI 35.09 kg/m

## 2017-05-18 ENCOUNTER — Inpatient Hospital Stay: Payer: Medicare Other | Admitting: Hematology

## 2017-05-18 ENCOUNTER — Inpatient Hospital Stay: Payer: Medicare Other | Attending: Hematology

## 2017-05-19 ENCOUNTER — Other Ambulatory Visit: Payer: Self-pay | Admitting: Urology

## 2017-05-20 ENCOUNTER — Telehealth: Payer: Self-pay | Admitting: *Deleted

## 2017-05-20 NOTE — Telephone Encounter (Signed)
xxxxx 

## 2017-05-20 NOTE — Telephone Encounter (Signed)
Called patient to inform of fid. Marker and space oar placement on 07-03-17 @ 11:30 am @ [place] and his sim on 07-09-17 @ 10 am @ Dr. Johny Shears Office, lvm for a return call

## 2017-05-27 ENCOUNTER — Other Ambulatory Visit: Payer: Self-pay | Admitting: Urology

## 2017-06-12 ENCOUNTER — Other Ambulatory Visit: Payer: Self-pay | Admitting: Urology

## 2017-06-12 DIAGNOSIS — C61 Malignant neoplasm of prostate: Secondary | ICD-10-CM

## 2017-06-29 ENCOUNTER — Encounter (HOSPITAL_BASED_OUTPATIENT_CLINIC_OR_DEPARTMENT_OTHER): Payer: Self-pay | Admitting: *Deleted

## 2017-06-30 ENCOUNTER — Encounter (HOSPITAL_BASED_OUTPATIENT_CLINIC_OR_DEPARTMENT_OTHER): Payer: Self-pay

## 2017-06-30 ENCOUNTER — Other Ambulatory Visit: Payer: Self-pay

## 2017-06-30 NOTE — Progress Notes (Signed)
*  Allergic to CHG* Spoke with:Anush Sr. And Lovenia Shuck NPO:  After Midnight, no gum, candy, or mints  Arrival time: 0930AM Labs: Istat4 (EKG on chart 09/2016) AM medications: Fleet enema, Amlodipine, Coreg, Rosuvastatin Pre op orders: Yes Ride home:  Jaxtyn Linville (son) [phone]

## 2017-06-30 NOTE — Progress Notes (Signed)
Ultrasound order released in order for [place] to plan scheduling day of surgery.

## 2017-07-02 NOTE — H&P (Signed)
H&P  Chief Complaint: Prostate Cancer  History of Present Illness:  [name] is a 72 year-old male who presents for SpaceOar and fiducial markers for high risk prostate cancer.   He has undergone Hormonal Therapy for treatment. The patient is taking lupron for hormonal manipulation. Patient denies zoladex, eligard, casodex, flutamide, trelstar, degarelix, and vantas. He was started on hormonal manipulation 08/12/2016.   H/o retention and underwent TURP/prostate urethral biopsy, bladder biopsy, RGP's June 2018. TUR and bx's c/w HG PCa.   High-risk prostate cancer - June 2018  Stage IIIc (T4 N0 M0, grade group 5)  Gleason 5+5 and 5+5 in all TUR prostate chips  Gleason 4+5 prostatic urethra biopsy  Gleason 5+5 ureteral orifice and bladder biopsy  Imaging studies:  Bone scan (07/08/16): Negative for metastatic disease.  CT of abdomen and pelvis (07/08/16): Dilation of pancreatic duct concerning for obstruction (MRI/MRCP recommended), numerous non-pathologically enlarged pelvic and retroperitoneal lymph nodes, bilateral small renal lesions (probable cysts but incompletely characterized on non-contrast imaging). There is 1 concerning left-sided pelvic lymph node that appears pathologically enlarged it was not mentioned on his report initially.  MRI with 2.8 cm pancreatic head mass - diagnosed with Stage IA Pancreatic Cancer   PMH: He has a history of CAD s/p CABG, hypertension, CKD (baseline Cr 2.0), and hyperlipidemia.  PSH: No abdominal surgeries.   Prostate cancer treatment: ADT initiated 08/12/2016 with Lupron 45 mg.   He was diagnosed with pancreatic cancer and underwent chemo and SBRT. Dr. Burr Medico has been in communication with Dr. Tammi Klippel. He developed DVT and is on Eliquis. He has CT scan and MRI on 11/25/16.   He received Lupron 04/17/2017. Repeat CT scan February 20, 2017 showed no obvious metastatic disease. He has had no dysuria, gross hematuria or chest pain. No fever.      Past  Medical History:  Diagnosis Date  . Acute MI, inferior wall ([place]) 2011  . Amputation finger    right first finger top portion age 19  . Aortic atherosclerosis ([place])   . ARF (acute renal failure) ([place]) 01/24/2015  . CHF (congestive heart failure) ([place])   . CKD (chronic kidney disease), stage III ([place]) 11/2015  . Coronary artery disease cardioloigst-  dr Stanford Breed   hx inferior MI 10-08-2009 emergency CABG x4  . DOE (dyspnea on exertion)    since starting chemo  . DVT (deep venous thrombosis) ([place]) 11/05/2016   right proximal-mid peroneal veins  . Fracture of left hip ([place])   . H/O hyperkalemia   . History of acute inferior wall myocardial infarction 10/08/2009  . History of blood transfusion   . History of chemotherapy   . History of echocardiogram 03/2012   EF 45%, LAE, Mild MR, no residual VSD  . History of first degree heart block   . History of metabolic acidosis   . History of thrombocytopenia   . History of urethral stricture   . History of UTI   . Hyperlipidemia   . Hypertension   . Ischemic cardiomyopathy   . Kidney cysts    bilateral  . Multiple pulmonary nodules    Bilateral scattered  . Multiple thyroid nodules   . Obstructed, uropathy   . Obstructive uropathy   . Pancreatic carcinoma (HCC)    s/p SBRT  . Pneumonia    Childhood  . Prostate cancer ([place])   . S/P VSD repair    09/2009  . Systolic CHF, chronic ([place])   . VSD (ventricular septal defect) 09/2009  acute after MI   Past Surgical History:  Procedure Laterality Date  . CARDIAC CATHETERIZATION    . CORONARY ARTERY BYPASS GRAFT     times 4  . CYSTOSCOPY W/ RETROGRADES N/A 04/03/2015   Procedure:  BLADDER BIOPSIES;  Surgeon: Festus Aloe, MD;  Location: [facility];  Service: Urology;  Laterality: N/A;  . CYSTOSCOPY W/ RETROGRADES Bilateral 06/27/2016   Procedure: CYSTOSCOPY WITH BILATERAL RETROGRADES;  Surgeon: Festus Aloe, MD;  Location: [facility];  Service: Urology;  Laterality: Bilateral;  .  CYSTOSCOPY WITH BIOPSY N/A 06/27/2016   Procedure: CYSTOSCOPY WITH BLADDER BIOPSY URETHRAL BIOPSY;  Surgeon: Festus Aloe, MD;  Location: [facility];  Service: Urology;  Laterality: N/A;  . CYSTOSCOPY WITH URETHRAL DILATATION N/A 04/03/2015   Procedure: CYSTOSCOPY WITH BILATERAL RETROGRADES;  Surgeon: Festus Aloe, MD;  Location: [facility];  Service: Urology;  Laterality: N/A;  . EUS N/A 08/28/2016   Procedure: UPPER ENDOSCOPIC ULTRASOUND (EUS) RADIAL;  Surgeon: Milus Banister, MD;  Location: [facility];  Service: Endoscopy;  Laterality: N/A;  . flexible cystoscopy    . repair of post infarction posterior ventricular septal defect  09/2009  . TRANSURETHRAL RESECTION OF PROSTATE  06/27/2016   Procedure: TRANSURETHRAL RESECTION OF THE PROSTATE (TURP);  Surgeon: Festus Aloe, MD;  Location: [facility];  Service: Urology;;  . UPPER GI ENDOSCOPY      Home Medications:  No medications prior to admission.   Allergies:  Allergies  Allergen Reactions  . Chlorhexidine Gluconate Other (See Comments)    Red skin and flaked skin     Family History  Problem Relation Age of Onset  . Cancer Paternal Aunt        unknown type cancer   . Cancer Paternal Uncle        unknown type cancer   Social History:  reports that he quit smoking about 7 years ago. His smoking use included cigarettes. He has a 67.50 pack-year smoking history. He has never used smokeless tobacco. He reports that he does not drink alcohol or use drugs.  ROS: A complete review of systems was performed.  All systems are negative except for pertinent findings as noted. Review of Systems  All other systems reviewed and are negative.    Physical Exam:  Vital signs in last 24 hours:   General:  Alert and oriented, No acute distress HEENT: Normocephalic, atraumatic Neck: No JVD or lymphadenopathy Cardiovascular: Regular rate and rhythm Lungs: Regular rate and effort Abdomen: Soft, nontender, nondistended, no abdominal  masses Back: No CVA tenderness Extremities: No edema Neurologic: Grossly intact  Laboratory Data:  No results found for this or any previous visit (from the past 24 hour(s)). No results found for this or any previous visit (from the past 240 hour(s)). Creatinine: No results for input(s): CREATININE in the last 168 hours.  Impression/Assessment:  Prostate cancer- Plan:  I discussed with the patient and his daughter I discussed with the patient the nature, potential benefits, risks and alternatives to transperineal placement of prostate fiducial markers and biodegradable gel, including side effects of the proposed treatment, the likelihood of the patient achieving the goals of the procedure, and any potential problems that might occur during the procedure or recuperation. All questions answered. Patient elects to proceed.    Festus Aloe 07/02/2017, 9:58 PM

## 2017-07-03 ENCOUNTER — Ambulatory Visit (HOSPITAL_BASED_OUTPATIENT_CLINIC_OR_DEPARTMENT_OTHER)
Admission: RE | Admit: 2017-07-03 | Discharge: 2017-07-03 | Disposition: A | Discharge status: Home / Self Care | Payer: Medicare Other | Source: Ambulatory Visit | Attending: Urology | Admitting: Urology

## 2017-07-03 ENCOUNTER — Other Ambulatory Visit: Payer: Self-pay

## 2017-07-03 ENCOUNTER — Encounter (HOSPITAL_BASED_OUTPATIENT_CLINIC_OR_DEPARTMENT_OTHER)
Admission: RE | Disposition: A | Discharge status: Home / Self Care | Payer: Self-pay | Source: Ambulatory Visit | Attending: Urology

## 2017-07-03 ENCOUNTER — Encounter (HOSPITAL_BASED_OUTPATIENT_CLINIC_OR_DEPARTMENT_OTHER): Payer: Self-pay | Admitting: *Deleted

## 2017-07-03 ENCOUNTER — Ambulatory Visit (HOSPITAL_BASED_OUTPATIENT_CLINIC_OR_DEPARTMENT_OTHER): Payer: Medicare Other | Admitting: Anesthesiology

## 2017-07-03 DIAGNOSIS — N390 Urinary tract infection, site not specified: Secondary | ICD-10-CM | POA: Diagnosis not present

## 2017-07-03 DIAGNOSIS — E785 Hyperlipidemia, unspecified: Secondary | ICD-10-CM | POA: Diagnosis not present

## 2017-07-03 DIAGNOSIS — N183 Chronic kidney disease, stage 3 (moderate): Secondary | ICD-10-CM | POA: Diagnosis not present

## 2017-07-03 DIAGNOSIS — I5022 Chronic systolic (congestive) heart failure: Secondary | ICD-10-CM | POA: Insufficient documentation

## 2017-07-03 DIAGNOSIS — I2510 Atherosclerotic heart disease of native coronary artery without angina pectoris: Secondary | ICD-10-CM | POA: Diagnosis not present

## 2017-07-03 DIAGNOSIS — Z888 Allergy status to other drugs, medicaments and biological substances status: Secondary | ICD-10-CM | POA: Diagnosis not present

## 2017-07-03 DIAGNOSIS — C61 Malignant neoplasm of prostate: Secondary | ICD-10-CM | POA: Diagnosis not present

## 2017-07-03 DIAGNOSIS — I252 Old myocardial infarction: Secondary | ICD-10-CM | POA: Insufficient documentation

## 2017-07-03 DIAGNOSIS — Z8507 Personal history of malignant neoplasm of pancreas: Secondary | ICD-10-CM | POA: Diagnosis not present

## 2017-07-03 DIAGNOSIS — I130 Hypertensive heart and chronic kidney disease with heart failure and stage 1 through stage 4 chronic kidney disease, or unspecified chronic kidney disease: Secondary | ICD-10-CM | POA: Diagnosis not present

## 2017-07-03 DIAGNOSIS — Z86718 Personal history of other venous thrombosis and embolism: Secondary | ICD-10-CM | POA: Diagnosis not present

## 2017-07-03 DIAGNOSIS — Z9221 Personal history of antineoplastic chemotherapy: Secondary | ICD-10-CM | POA: Diagnosis not present

## 2017-07-03 DIAGNOSIS — I10 Essential (primary) hypertension: Secondary | ICD-10-CM | POA: Diagnosis not present

## 2017-07-03 DIAGNOSIS — Z7901 Long term (current) use of anticoagulants: Secondary | ICD-10-CM | POA: Diagnosis not present

## 2017-07-03 DIAGNOSIS — Z951 Presence of aortocoronary bypass graft: Secondary | ICD-10-CM | POA: Insufficient documentation

## 2017-07-03 DIAGNOSIS — Z87891 Personal history of nicotine dependence: Secondary | ICD-10-CM | POA: Insufficient documentation

## 2017-07-03 DIAGNOSIS — I700 Atherosclerosis of aorta: Secondary | ICD-10-CM | POA: Insufficient documentation

## 2017-07-03 HISTORY — DX: Other forms of dyspnea: R06.09

## 2017-07-03 HISTORY — DX: Cyst of kidney, acquired: N28.1

## 2017-07-03 HISTORY — DX: Dyspnea, unspecified: R06.00

## 2017-07-03 HISTORY — DX: Fracture of unspecified part of neck of left femur, initial encounter for closed fracture: S72.002A

## 2017-07-03 HISTORY — DX: Other nonspecific abnormal finding of lung field: R91.8

## 2017-07-03 HISTORY — DX: Chronic kidney disease, stage 3 (moderate): N18.3

## 2017-07-03 HISTORY — DX: Personal history of other diseases of the circulatory system: Z86.79

## 2017-07-03 HISTORY — DX: Atherosclerosis of aorta: I70.0

## 2017-07-03 HISTORY — DX: Personal history of other medical treatment: Z92.89

## 2017-07-03 HISTORY — DX: Acute embolism and thrombosis of unspecified deep veins of unspecified lower extremity: I82.409

## 2017-07-03 HISTORY — DX: Ventricular septal defect: Q21.0

## 2017-07-03 HISTORY — DX: Personal history of antineoplastic chemotherapy: Z92.21

## 2017-07-03 HISTORY — PX: SPACE OAR INSTILLATION: SHX6769

## 2017-07-03 HISTORY — DX: Hyperlipidemia, unspecified: E78.5

## 2017-07-03 HISTORY — DX: Personal history of diseases of the blood and blood-forming organs and certain disorders involving the immune mechanism: Z86.2

## 2017-07-03 HISTORY — DX: Acute kidney failure, unspecified: N17.9

## 2017-07-03 HISTORY — DX: Chronic systolic (congestive) heart failure: I50.22

## 2017-07-03 HISTORY — DX: Obstructive and reflux uropathy, unspecified: N13.9

## 2017-07-03 HISTORY — DX: Personal history of other diseases of urinary system: Z87.448

## 2017-07-03 HISTORY — DX: Ischemic cardiomyopathy: I25.5

## 2017-07-03 HISTORY — PX: GOLD SEED IMPLANT: SHX6343

## 2017-07-03 HISTORY — DX: Personal history of urinary (tract) infections: Z87.440

## 2017-07-03 HISTORY — DX: Old myocardial infarction: I25.2

## 2017-07-03 HISTORY — DX: Malignant neoplasm of pancreas, unspecified: C25.9

## 2017-07-03 HISTORY — DX: Nontoxic multinodular goiter: E04.2

## 2017-07-03 LAB — POCT I-STAT 4, (NA,K, GLUC, HGB,HCT)
GLUCOSE: 101 mg/dL — AB (ref 65–99)
HEMATOCRIT: 42 % (ref 39.0–52.0)
HEMOGLOBIN: 14.3 g/dL (ref 13.0–17.0)
POTASSIUM: 4.1 mmol/L (ref 3.5–5.1)
Sodium: 143 mmol/L (ref 135–145)

## 2017-07-03 SURGERY — INSERTION, GOLD SEEDS
Anesthesia: General | Site: Rectum

## 2017-07-03 MED ORDER — MEPERIDINE HCL 25 MG/ML IJ SOLN
6.2500 mg | INTRAMUSCULAR | Status: DC | PRN
  Filled 2017-07-03: qty 1

## 2017-07-03 MED ORDER — EPHEDRINE 5 MG/ML INJ
INTRAVENOUS | Status: AC
  Filled 2017-07-03: qty 10

## 2017-07-03 MED ORDER — SODIUM CHLORIDE 0.9 % IV SOLN
INTRAVENOUS | Status: AC
  Filled 2017-07-03: qty 100

## 2017-07-03 MED ORDER — ONDANSETRON HCL 4 MG/2ML IJ SOLN
INTRAMUSCULAR | Status: DC | PRN
  Administered 2017-07-03: 4 mg via INTRAVENOUS

## 2017-07-03 MED ORDER — FENTANYL CITRATE (PF) 100 MCG/2ML IJ SOLN
INTRAMUSCULAR | Status: DC | PRN
  Administered 2017-07-03: 50 ug via INTRAVENOUS
  Administered 2017-07-03: 25 ug via INTRAVENOUS

## 2017-07-03 MED ORDER — PROMETHAZINE HCL 25 MG/ML IJ SOLN
6.2500 mg | INTRAMUSCULAR | Status: DC | PRN
  Filled 2017-07-03: qty 1

## 2017-07-03 MED ORDER — LIDOCAINE 2% (20 MG/ML) 5 ML SYRINGE
INTRAMUSCULAR | Status: AC
  Filled 2017-07-03: qty 5

## 2017-07-03 MED ORDER — EPHEDRINE SULFATE-NACL 50-0.9 MG/10ML-% IV SOSY
PREFILLED_SYRINGE | INTRAVENOUS | Status: DC | PRN
  Administered 2017-07-03: 25 mg via INTRAVENOUS

## 2017-07-03 MED ORDER — CARVEDILOL 12.5 MG PO TABS
12.5000 mg | ORAL_TABLET | Freq: Two times a day (BID) | ORAL | Status: DC
  Administered 2017-07-03: 12.5 mg via ORAL
  Filled 2017-07-03: qty 1

## 2017-07-03 MED ORDER — DEXAMETHASONE SODIUM PHOSPHATE 10 MG/ML IJ SOLN
INTRAMUSCULAR | Status: AC
  Filled 2017-07-03: qty 1

## 2017-07-03 MED ORDER — SODIUM CHLORIDE 0.9 % IV SOLN
2.0000 g | Freq: Once | INTRAVENOUS | Status: AC
  Administered 2017-07-03: 2 g via INTRAVENOUS
  Filled 2017-07-03: qty 20

## 2017-07-03 MED ORDER — SODIUM CHLORIDE FLUSH 0.9 % IV SOLN
INTRAVENOUS | Status: DC | PRN
  Administered 2017-07-03: 10 mL

## 2017-07-03 MED ORDER — LIDOCAINE 2% (20 MG/ML) 5 ML SYRINGE
INTRAMUSCULAR | Status: DC | PRN
  Administered 2017-07-03: 60 mg via INTRAVENOUS

## 2017-07-03 MED ORDER — FENTANYL CITRATE (PF) 100 MCG/2ML IJ SOLN
25.0000 ug | INTRAMUSCULAR | Status: DC | PRN
  Filled 2017-07-03: qty 1

## 2017-07-03 MED ORDER — CARVEDILOL 12.5 MG PO TABS
12.5000 mg | ORAL_TABLET | Freq: Two times a day (BID) | ORAL | Status: DC
  Filled 2017-07-03: qty 1

## 2017-07-03 MED ORDER — PROPOFOL 10 MG/ML IV BOLUS
INTRAVENOUS | Status: DC | PRN
  Administered 2017-07-03: 10 mg via INTRAVENOUS
  Administered 2017-07-03: 20 mg via INTRAVENOUS
  Administered 2017-07-03: 30 mg via INTRAVENOUS
  Administered 2017-07-03: 70 mg via INTRAVENOUS

## 2017-07-03 MED ORDER — PHENYLEPHRINE 40 MCG/ML (10ML) SYRINGE FOR IV PUSH (FOR BLOOD PRESSURE SUPPORT)
PREFILLED_SYRINGE | INTRAVENOUS | Status: AC
  Filled 2017-07-03: qty 10

## 2017-07-03 MED ORDER — FLEET ENEMA 7-19 GM/118ML RE ENEM
1.0000 | ENEMA | Freq: Once | RECTAL | Status: AC
  Administered 2017-07-03 (×2): 1 via RECTAL
  Filled 2017-07-03: qty 1

## 2017-07-03 MED ORDER — ARTIFICIAL TEARS OPHTHALMIC OINT
TOPICAL_OINTMENT | OPHTHALMIC | Status: AC
  Filled 2017-07-03: qty 3.5

## 2017-07-03 MED ORDER — FENTANYL CITRATE (PF) 100 MCG/2ML IJ SOLN
INTRAMUSCULAR | Status: AC
  Filled 2017-07-03: qty 2

## 2017-07-03 MED ORDER — PHENYLEPHRINE 40 MCG/ML (10ML) SYRINGE FOR IV PUSH (FOR BLOOD PRESSURE SUPPORT)
PREFILLED_SYRINGE | INTRAVENOUS | Status: DC | PRN
  Administered 2017-07-03 (×2): 80 ug via INTRAVENOUS
  Administered 2017-07-03: 120 ug via INTRAVENOUS
  Administered 2017-07-03 (×2): 80 ug via INTRAVENOUS
  Administered 2017-07-03: 40 ug via INTRAVENOUS

## 2017-07-03 MED ORDER — SODIUM CHLORIDE 0.9 % IV SOLN
INTRAVENOUS | Status: DC
  Administered 2017-07-03: 10:00:00 via INTRAVENOUS
  Filled 2017-07-03: qty 1000

## 2017-07-03 MED ORDER — MIDAZOLAM HCL 2 MG/2ML IJ SOLN
0.5000 mg | Freq: Once | INTRAMUSCULAR | Status: DC | PRN
  Filled 2017-07-03: qty 2

## 2017-07-03 MED ORDER — PROPOFOL 10 MG/ML IV BOLUS
INTRAVENOUS | Status: AC
  Filled 2017-07-03: qty 40

## 2017-07-03 MED ORDER — DEXAMETHASONE SODIUM PHOSPHATE 4 MG/ML IJ SOLN
INTRAMUSCULAR | Status: DC | PRN
  Administered 2017-07-03: 4 mg via INTRAVENOUS

## 2017-07-03 MED ORDER — CEFTRIAXONE SODIUM 2 G IJ SOLR
INTRAMUSCULAR | Status: AC
  Filled 2017-07-03: qty 20

## 2017-07-03 MED ORDER — ONDANSETRON HCL 4 MG/2ML IJ SOLN
INTRAMUSCULAR | Status: AC
  Filled 2017-07-03: qty 2

## 2017-07-03 SURGICAL SUPPLY — 15 items
COVER TABLE BACK 60X90 (DRAPES) ×4 IMPLANT
DRSG TEGADERM 4X4.75 (GAUZE/BANDAGES/DRESSINGS) ×6 IMPLANT
DRSG TEGADERM 8X12 (GAUZE/BANDAGES/DRESSINGS) ×8 IMPLANT
GAUZE SPONGE 4X4 12PLY STRL (GAUZE/BANDAGES/DRESSINGS) ×2 IMPLANT
GLOVE BIO SURGEON STRL SZ7.5 (GLOVE) ×4 IMPLANT
GLOVE ECLIPSE 8.0 STRL XLNG CF (GLOVE) ×4 IMPLANT
GOWN STRL REUS W/TWL XL LVL3 (GOWN DISPOSABLE) ×4 IMPLANT
IMPL SPACEOAR SYSTEM 10ML (MISCELLANEOUS) ×2 IMPLANT
IMPLANT SPACEOAR SYSTEM 10ML (MISCELLANEOUS) ×4
KIT TURNOVER CYSTO (KITS) ×4 IMPLANT
MARKER GOLD PRELOAD 1.2X3 (Urological Implant) ×2 IMPLANT
PACK CYSTO (CUSTOM PROCEDURE TRAY) ×4 IMPLANT
SEED GOLD PRELOAD 1.2X3 (Urological Implant) ×4 IMPLANT
SURGILUBE 2OZ TUBE FLIPTOP (MISCELLANEOUS) ×4 IMPLANT
UNDERPAD 30X30 (UNDERPADS AND DIAPERS) ×8 IMPLANT

## 2017-07-03 NOTE — Anesthesia Preprocedure Evaluation (Addendum)
Anesthesia Evaluation  Patient identified by MRN, date of birth, ID band Patient awake    Reviewed: Allergy & Precautions, NPO status , Patient's Chart, lab work & pertinent test results, reviewed documented beta blocker date and time   History of Anesthesia Complications Negative for: history of anesthetic complications  Airway Mallampati: II  TM Distance: >3 FB Neck ROM: Full    Dental  (+) Edentulous Upper, Poor Dentition, Missing, Chipped, Dental Advisory Given   Pulmonary former smoker (quit 2012),    breath sounds clear to auscultation       Cardiovascular hypertension, Pt. on medications and Pt. on home beta blockers (-) angina+ Past MI, + CABG and + DVT   Rhythm:Regular Rate:Normal  Echo 3/14 showed EF 45%, LAE, mild MR and no residual VSD   Neuro/Psych negative neurological ROS     GI/Hepatic Pancreatic cancer: chemo   Endo/Other  negative endocrine ROS  Renal/GU Renal InsufficiencyRenal disease   Prostate cancer    Musculoskeletal   Abdominal   Peds  Hematology negative hematology ROS (+)   Anesthesia Other Findings   Reproductive/Obstetrics                            Anesthesia Physical Anesthesia Plan  ASA: III  Anesthesia Plan: General   Post-op Pain Management:    Induction: Intravenous  PONV Risk Score and Plan: 2 and Ondansetron and Dexamethasone  Airway Management Planned: LMA  Additional Equipment:   Intra-op Plan:   Post-operative Plan:   Informed Consent: I have reviewed the patients History and Physical, chart, labs and discussed the procedure including the risks, benefits and alternatives for the proposed anesthesia with the patient or authorized representative who has indicated his/her understanding and acceptance.   Dental advisory given  Plan Discussed with: CRNA and Surgeon  Anesthesia Plan Comments: (Plan routine monitors, GA- LMA OK Pt with  no chest symptoms, no dyspnea. Understands he has stress test and ECHO ordered by Cardiology. Agrees to follow-up with these studies. Since pt asymptomatic, and low risk procedure, will proceed today. )        Anesthesia Quick Evaluation

## 2017-07-03 NOTE — Discharge Instructions (Signed)
Brachytherapy for Prostate Cancer, Care After °Refer to this sheet in the next few weeks. These instructions provide you with information on caring for yourself after your procedure. Your health care provider may also give you more specific instructions. Your treatment has been planned according to current medical practices, but problems sometimes occur. Call your health care provider if you have any problems or questions after your procedure. °What can I expect after the procedure? °The area behind the scrotum will probably be tender and bruised. For a short period of time you may have: °· Difficulty passing urine. You may need a catheter for a few days to a month. °· Blood in the urine or semen. °· A feeling of constipation because of prostate swelling. °· Frequent feeling of an urgent need to urinate. ° °For a long period of time you may have: °· Inflammation of the rectum. This happens in about 2% of people who have the procedure. °· Erection problems. These vary with age and occur in about 15-40% of men. °· Difficulty urinating. This is caused by scarring in the urethra. °· Diarrhea. ° °Follow these instructions at home: °· Take medicines only as directed by your health care provider. °· You will probably have a catheter in your bladder for several days. You will have blood in the urine bag and should drink a lot of fluids to keep it a light red color. °· Keep all follow-up visits as directed by your health care provider. If you have a catheter, it will be removed during one of these visits. °· Try not to sit directly on the area behind the scrotum. A soft cushion can decrease the discomfort. Ice packs may also be helpful for the discomfort. Do not put ice directly on the skin. °· Shower and wash the area behind the scrotum gently. Do not sit in a tub. °· If you have had the brachytherapy that uses the seeds, limit your close contact with children and pregnant women for 2 months because of the radiation still  in the prostate. After that period of time, the levels drop off quickly. °Get help right away if: °· You have a fever. °· You have chills. °· You have shortness of breath. °· You have chest pain. °· You have thick blood, like tomato juice, in the urine bag. °· Your catheter is blocked so urine cannot get into the bag. Your bladder area or lower abdomen may be swollen. °· There is excessive bleeding from your rectum. It is normal to have a little blood mixed with your stool. °· There is severe discomfort in the treated area that does not go away with pain medicine. °· You have abdominal discomfort. °· You have severe nausea or vomiting. °· You develop any new or unusual symptoms. °This information is not intended to replace advice given to you by your health care provider. Make sure you discuss any questions you have with your health care provider. °Document Released: 02/01/2010 Document Revised: 06/13/2015 Document Reviewed: 06/22/2012 °Elsevier Interactive Patient Education © 2017 [employer] ° °Post Anesthesia Home Care Instructions ° °Activity: °Get plenty of rest for the remainder of the day. A responsible individual must stay with you for 24 hours following the procedure.  °For the next 24 hours, DO NOT: °-Drive a car °-Operate machinery °-Drink alcoholic beverages °-Take any medication unless instructed by your physician °-Make any legal decisions or sign important papers. ° °Meals: °Start with liquid foods such as gelatin or soup. Progress to regular foods   as tolerated. Avoid greasy, spicy, heavy foods. If nausea and/or vomiting occur, drink only clear liquids until the nausea and/or vomiting subsides. Call your physician if vomiting continues. ° °Special Instructions/Symptoms: °Your throat may feel dry or sore from the anesthesia or the breathing tube placed in your throat during surgery. If this causes discomfort, gargle with warm salt water. The discomfort should disappear within 24 hours. ° °If you had  a scopolamine patch placed behind your ear for the management of post- operative nausea and/or vomiting: ° °1. The medication in the patch is effective for 72 hours, after which it should be removed.  Wrap patch in a tissue and discard in the trash. Wash hands thoroughly with soap and water. °2. You may remove the patch earlier than 72 hours if you experience unpleasant side effects which may include dry mouth, dizziness or visual disturbances. °3. Avoid touching the patch. Wash your hands with soap and water after contact with the patch. °  ° °

## 2017-07-03 NOTE — Transfer of Care (Signed)
Last Vitals:  Vitals Value Taken Time  BP 116/75 07/03/2017 12:46 PM  Temp 36.6 C 07/03/2017 12:45 PM  Pulse 59 07/03/2017 12:50 PM  Resp 16 07/03/2017 12:50 PM  SpO2 100 % 07/03/2017 12:50 PM  Vitals shown include unvalidated device data.  Last Pain:  Vitals:   07/03/17 1043  TempSrc:   PainSc: 0-No pain      Patients Stated Pain Goal: 8 (07/03/17 1043)  Immediate Anesthesia Transfer of Care Note  Patient: [name]  Procedure(s) Performed: Procedure(s) (LRB): GOLD SEED IMPLANT (N/A) SPACE OAR INSTILLATION (N/A)  Patient Location: PACU  Anesthesia Type: General  Level of Consciousness: awake, alert  and oriented  Airway & Oxygen Therapy: Patient Spontanous Breathing and Patient connected to nasal cannula oxygen  Post-op Assessment: Report given to PACU RN and Post -op Vital signs reviewed and stable  Post vital signs: Reviewed and stable  Complications: No apparent anesthesia complications

## 2017-07-03 NOTE — Op Note (Signed)
Preoperative diagnosis: High risk locally advanced prostate cancer Postoperative diagnosis: Same  Procedure: Transrectal ultrasound guided transperineal placement of prostate fiducial gold seed markers and Spaceor biodegradable gel  Surgeon: Junious Silk  Radiation oncologist: Tammi Klippel  Anesthesia: General  Indication for procedure: 72 year old with locally advanced high risk prostate cancer.  He started ADT about a year ago but radiation was delayed due to pancreatic cancer.  He underwent staging February 2019 which showed no metastatic disease.  He was brought today for fiducial markers and biodegradable gel placement prior to external beam radiotherapy.  Findings: Prostate was small, urethra well visualized.  Description of procedure: After consent was obtained the patient brought to the operating room.  He was placed in high lithotomy position in the scrotum taped out of the field.  The transrectal ultrasound was passed per rectum.  The prostate, bladder, seminal vesicles were visualized.  The urethra was well seen without a catheter.  The perineum was prepped.  Fiducial markers were placed in the right base, right apex and left mid prostate under ultrasound guidance.  The biodegradable needle was then advanced into the nonbilious fascia behind the prostate and in front of the rectum.  Saline was injected and a clear separation was noted between the prostate and the rectal wall.  This was confirmed in the sagittal and axial views. SpaceOar was injected and noted to separate the prostate and rectal wall. The needle was removed. The patient tolerated the procedure.  He was awakened and taken to the recovery room in stable condition.  Complications: None  Blood loss: Minimal  Specimens: None  Drains: None  Disposition: Patient stable to PACU

## 2017-07-03 NOTE — Anesthesia Procedure Notes (Addendum)
Procedure Name: LMA Insertion Date/Time: 07/03/2017 12:13 PM Performed by: Annye Asa, MD Pre-anesthesia Checklist: Patient identified, Emergency Drugs available, Suction available and Patient being monitored Patient Re-evaluated:Patient Re-evaluated prior to induction Oxygen Delivery Method: Circle system utilized Preoxygenation: Pre-oxygenation with 100% oxygen Induction Type: IV induction Ventilation: Mask ventilation without difficulty LMA: LMA inserted LMA Size: 4.0 Number of attempts: 1 Airway Equipment and Method: Bite block Placement Confirmation: positive ETCO2 Tube secured with: Tape Dental Injury: Teeth and Oropharynx as per pre-operative assessment

## 2017-07-03 NOTE — Anesthesia Postprocedure Evaluation (Signed)
Anesthesia Post Note  Patient: [name]  Procedure(s) Performed: GOLD SEED IMPLANT (N/A Prostate) SPACE OAR INSTILLATION (N/A Rectum)     Patient location during evaluation: PACU Anesthesia Type: General Level of consciousness: awake and alert, oriented and patient cooperative Pain management: pain level controlled Vital Signs Assessment: post-procedure vital signs reviewed and stable Respiratory status: spontaneous breathing, nonlabored ventilation and respiratory function stable Cardiovascular status: blood pressure returned to baseline and stable Postop Assessment: no apparent nausea or vomiting Anesthetic complications: no    Last Vitals:  Vitals:   07/03/17 1318 07/03/17 1319  BP:    Pulse: 60   Resp: 18   Temp:    SpO2: 99% 99%    Last Pain:  Vitals:   07/03/17 1300  TempSrc:   PainSc: 0-No pain                 Deward Sebek,E. Tiffiany Beadles

## 2017-07-06 ENCOUNTER — Encounter (HOSPITAL_BASED_OUTPATIENT_CLINIC_OR_DEPARTMENT_OTHER): Payer: Self-pay | Admitting: Urology

## 2017-07-08 ENCOUNTER — Telehealth (HOSPITAL_COMMUNITY): Payer: Self-pay | Admitting: *Deleted

## 2017-07-08 ENCOUNTER — Other Ambulatory Visit (HOSPITAL_COMMUNITY): Payer: Self-pay

## 2017-07-08 DIAGNOSIS — R0989 Other specified symptoms and signs involving the circulatory and respiratory systems: Secondary | ICD-10-CM

## 2017-07-08 NOTE — Telephone Encounter (Signed)
Left message on voicemail in reference to upcoming appointment scheduled for 07/14/17. Phone number given for a call back so details instructions can be given.  Kirstie Peri, RN

## 2017-07-09 ENCOUNTER — Ambulatory Visit: Payer: Medicare Other | Admitting: Radiation Oncology

## 2017-07-09 ENCOUNTER — Ambulatory Visit (HOSPITAL_COMMUNITY): Admission: RE | Admit: 2017-07-09 | Payer: Medicare Other | Source: Ambulatory Visit

## 2017-07-09 ENCOUNTER — Telehealth: Payer: Self-pay | Admitting: *Deleted

## 2017-07-09 NOTE — Telephone Encounter (Signed)
Called patient to inform that MRI has been moved to 07-17-17 - arrival time - 2:30 pm @ [facility], no restrictions to test, spoke with patient and he is aware of this test

## 2017-07-14 ENCOUNTER — Encounter (HOSPITAL_COMMUNITY): Payer: Self-pay

## 2017-07-15 ENCOUNTER — Telehealth: Payer: Self-pay | Admitting: *Deleted

## 2017-07-15 NOTE — Telephone Encounter (Signed)
CALLED PATIENT TO REMIND OF SIM APPT. FOR 07-17-17 @ 9 AM AND HIS MRI ON 07-17-17 - ARRIVAL TIME - 2:30 PM @ [facility], NO RESTRICTIONS TO TEST , TEST TO BEGIN @ 3 PM, SPOKE WITH PATIENT AND HE IS AWARE OF THESE APPTS.

## 2017-07-16 NOTE — Progress Notes (Signed)
  Radiation Oncology         [phone] ________________________________  Name: [name] MRN: [id number]  Date: 07/17/2017  DOB: January 12, 1946  SIMULATION AND TREATMENT PLANNING NOTE    ICD-10-CM   1. Malignant neoplasm of prostate ([place]) C61     DIAGNOSIS:  72 y.o. gentleman with locally advanced adenocarcinoma of the prostate with a Gleason's score of 5+5 and a PSA of 17.5  NARRATIVE:  The patient was brought to the [facility].  Identity was confirmed.  All relevant records and images related to the planned course of therapy were reviewed.  The patient freely provided informed written consent to proceed with treatment after reviewing the details related to the planned course of therapy. The consent form was witnessed and verified by the simulation staff.  Then, the patient was set-up in a stable reproducible supine position for radiation therapy.  A vacuum lock pillow device was custom fabricated to position his legs in a reproducible immobilized position.  Then, I performed a urethrogram under sterile conditions to identify the prostatic apex.  CT images were obtained.  Surface markings were placed.  The CT images were loaded into the planning software.  Then the prostate target and avoidance structures including the rectum, bladder, bowel and hips were contoured.  Treatment planning then occurred.  The radiation prescription was entered and confirmed.  A total of one complex treatment devices were fabricated. I have requested : Intensity Modulated Radiotherapy (IMRT) is medically necessary for this case for the following reason:  Rectal sparing.Marland Kitchen  PLAN:  The patient will receive 45 Gy in 25 fractions of 1.8 Gy, followed by a boost to the prostate to a total dose of 75 Gy with 15 additional fractions of 2.0 Gy.  ________________________________  Sheral Apley Tammi Klippel, M.D.

## 2017-07-17 ENCOUNTER — Ambulatory Visit
Admission: RE | Admit: 2017-07-17 | Discharge: 2017-07-17 | Disposition: A | Discharge status: Home / Self Care | Payer: Medicare Other | Source: Ambulatory Visit | Attending: Radiation Oncology | Admitting: Radiation Oncology

## 2017-07-17 ENCOUNTER — Ambulatory Visit (HOSPITAL_COMMUNITY)
Admission: RE | Admit: 2017-07-17 | Discharge: 2017-07-17 | Disposition: A | Discharge status: Home / Self Care | Payer: Medicare Other | Source: Ambulatory Visit | Attending: Urology | Admitting: Urology

## 2017-07-17 ENCOUNTER — Encounter: Payer: Self-pay | Admitting: Medical Oncology

## 2017-07-17 DIAGNOSIS — Z510 Encounter for antineoplastic radiation therapy: Secondary | ICD-10-CM | POA: Insufficient documentation

## 2017-07-17 DIAGNOSIS — C250 Malignant neoplasm of head of pancreas: Secondary | ICD-10-CM

## 2017-07-17 DIAGNOSIS — C61 Malignant neoplasm of prostate: Secondary | ICD-10-CM | POA: Insufficient documentation

## 2017-07-17 NOTE — Progress Notes (Signed)
Mr. Hollice Espy seen in [facility] 07/25/16 for prostate cancer. CT for prostate work up revealed a pancreatic mass.  He was diagnosed with pancreatic cancer and underwent chemotherapy and radiation. He received ADT and prostate radiation was deferred while receiving treatment for pancreatic cancer. He had fiducial markers and SpaceOAR placed 07/03/17 and presents today for CT simulation. All questions were answered and I asked him and his sister to call with questions or concerns. They voiced understanding.He begins radiation 7/16.

## 2017-07-24 ENCOUNTER — Telehealth (HOSPITAL_COMMUNITY): Payer: Self-pay | Admitting: *Deleted

## 2017-07-24 NOTE — Telephone Encounter (Signed)
Left message on voicemail in reference to upcoming appointment scheduled for 07/28/17. Phone number given for a call back so details instructions can be given.  [name]

## 2017-07-27 DIAGNOSIS — C61 Malignant neoplasm of prostate: Secondary | ICD-10-CM | POA: Diagnosis not present

## 2017-07-27 DIAGNOSIS — Z510 Encounter for antineoplastic radiation therapy: Secondary | ICD-10-CM | POA: Diagnosis not present

## 2017-07-28 ENCOUNTER — Other Ambulatory Visit (HOSPITAL_COMMUNITY): Payer: Self-pay

## 2017-07-28 ENCOUNTER — Encounter: Payer: Self-pay | Admitting: Medical Oncology

## 2017-07-28 ENCOUNTER — Encounter (HOSPITAL_COMMUNITY): Payer: Self-pay

## 2017-07-28 ENCOUNTER — Ambulatory Visit
Admission: RE | Admit: 2017-07-28 | Discharge: 2017-07-28 | Disposition: A | Discharge status: Home / Self Care | Payer: Medicare Other | Source: Ambulatory Visit | Attending: Radiation Oncology | Admitting: Radiation Oncology

## 2017-07-28 DIAGNOSIS — Z510 Encounter for antineoplastic radiation therapy: Secondary | ICD-10-CM | POA: Diagnosis not present

## 2017-07-28 DIAGNOSIS — C61 Malignant neoplasm of prostate: Secondary | ICD-10-CM | POA: Diagnosis not present

## 2017-07-29 ENCOUNTER — Ambulatory Visit
Admission: RE | Admit: 2017-07-29 | Discharge: 2017-07-29 | Disposition: A | Discharge status: Home / Self Care | Payer: Medicare Other | Source: Ambulatory Visit | Attending: Radiation Oncology | Admitting: Radiation Oncology

## 2017-07-29 DIAGNOSIS — C61 Malignant neoplasm of prostate: Secondary | ICD-10-CM | POA: Diagnosis not present

## 2017-07-29 DIAGNOSIS — Z510 Encounter for antineoplastic radiation therapy: Secondary | ICD-10-CM | POA: Diagnosis not present

## 2017-07-29 IMAGING — CR DG HIP (WITH OR WITHOUT PELVIS) 2-3V*L*
3 series · 3 of 3 positions shown · non-contrast
Comparison: None.

CLINICAL DATA: Left hip pain after injury. Tripped over
grandchildren resulting in fall injuring left hip.

EXAM:
DG HIP (WITH OR WITHOUT PELVIS) 2-3V LEFT

[t pelvis ap]
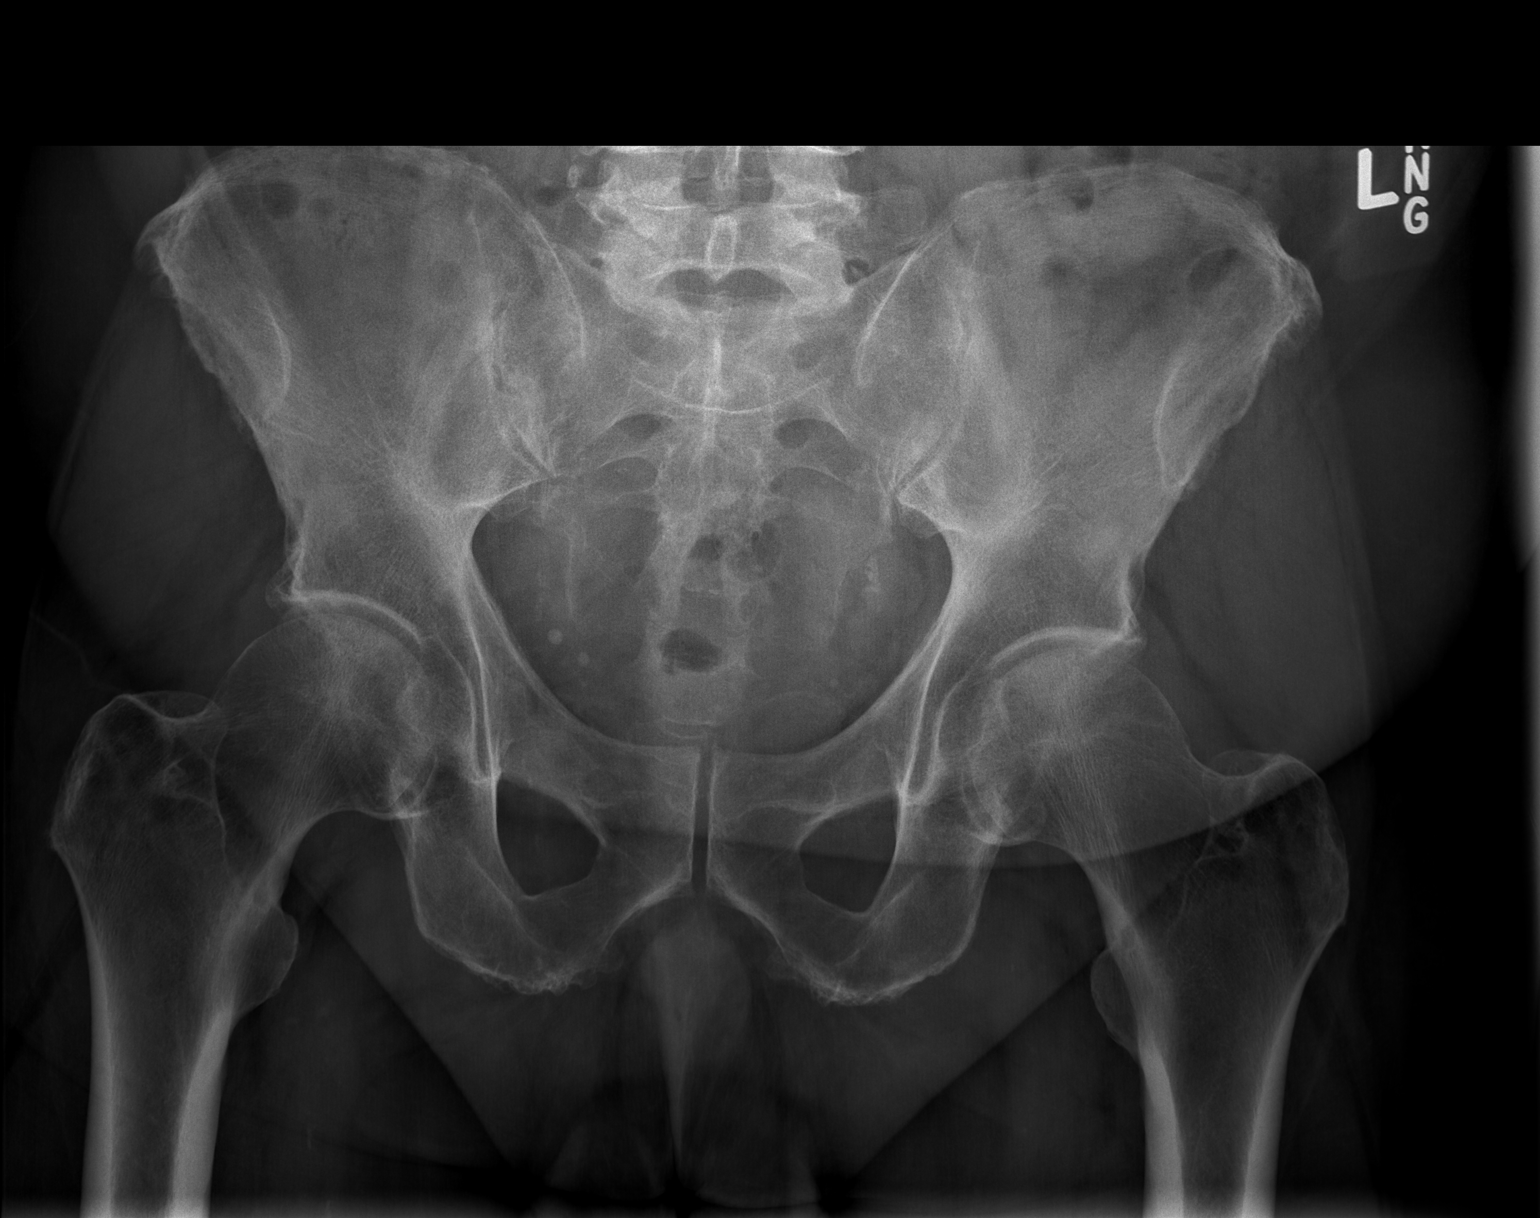

[t hip ap left]
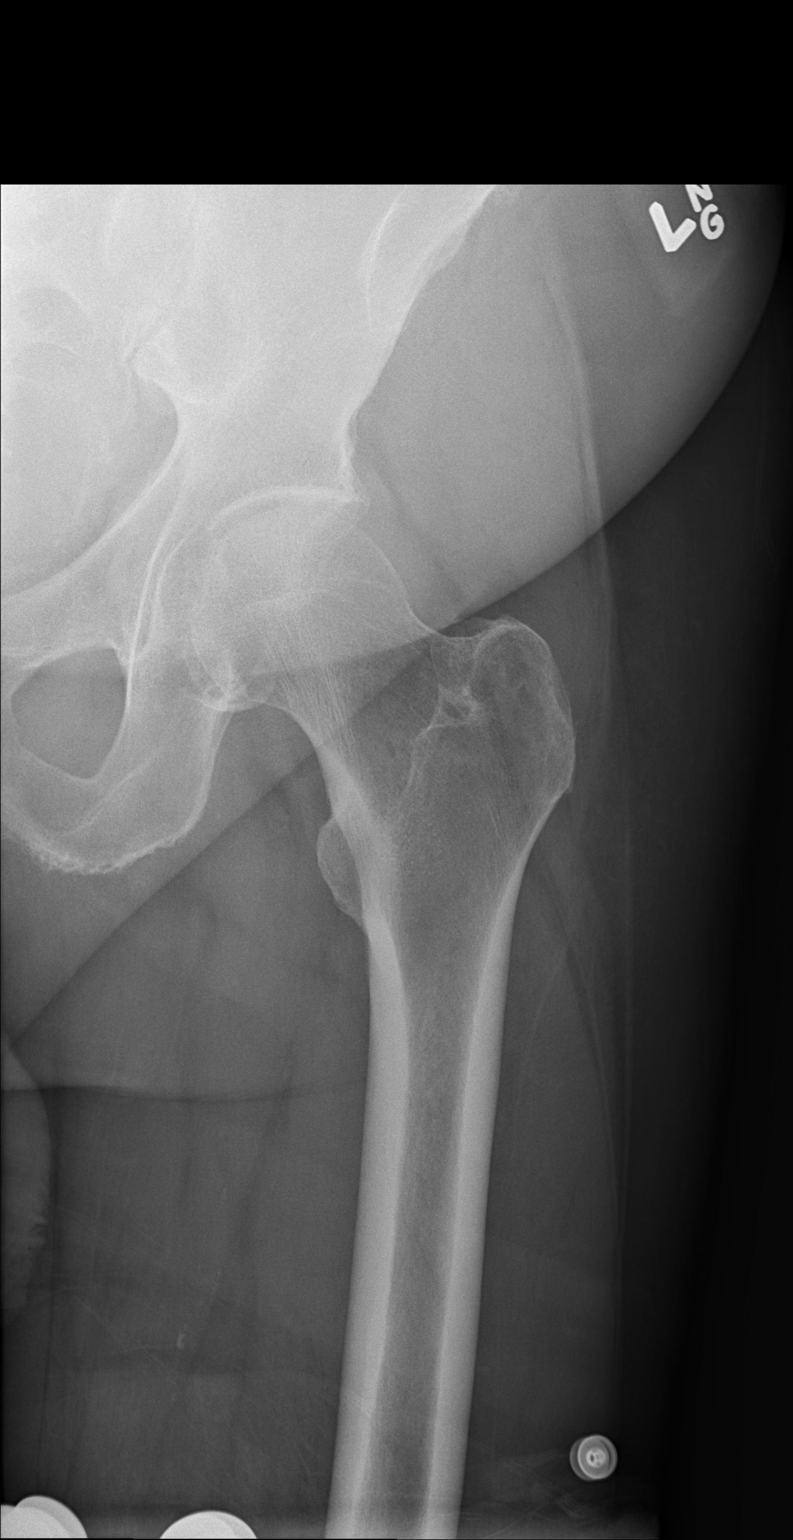

[t hip frog leg left]
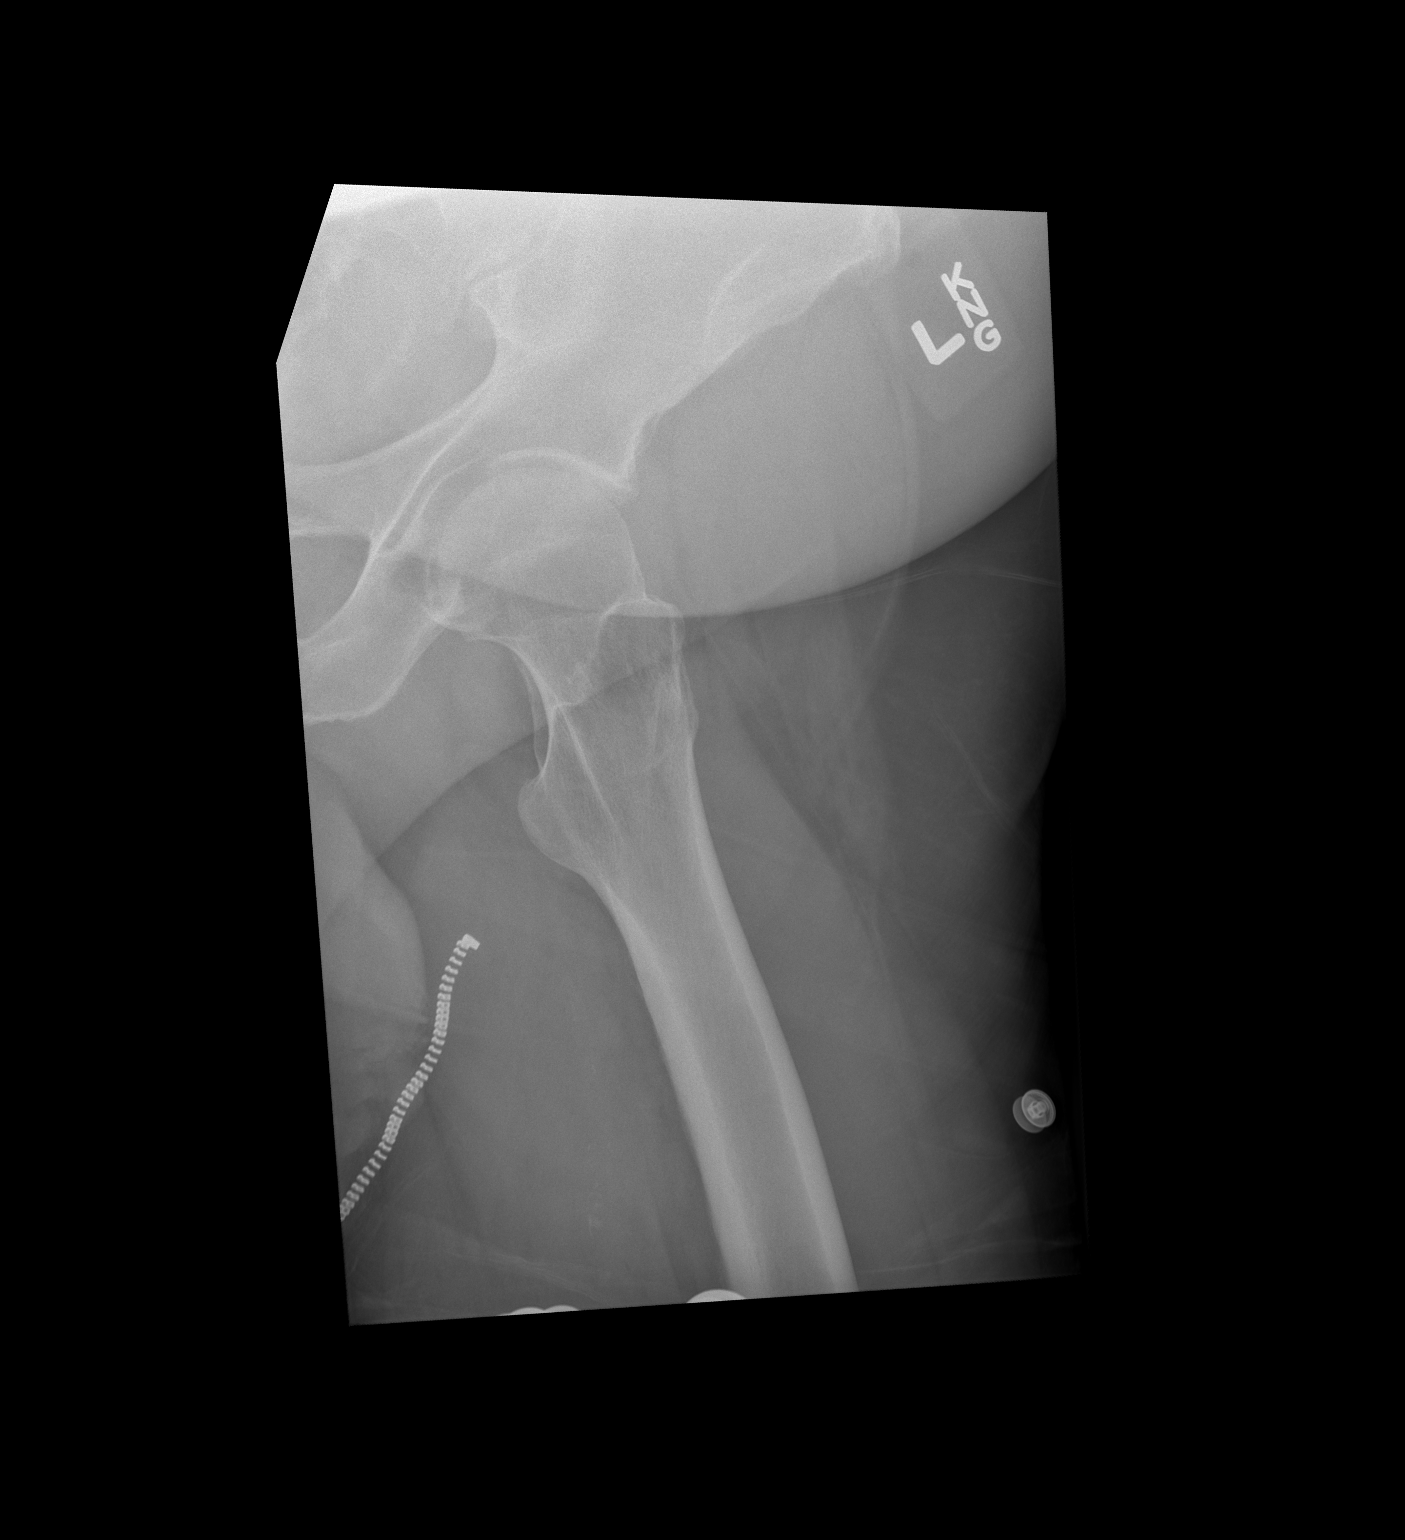

[3 of 3 positions shown; findings below may reference images not displayed]

FINDINGS: The cortical margins of the bony pelvis and left hip are intact. No
fracture. Pubic symphysis and sacroiliac joints are congruent. Both
femoral heads are well-seated in the respective acetabula.
IMPRESSION: No fracture or dislocation of the pelvis or left hip.

## 2017-07-30 ENCOUNTER — Ambulatory Visit
Admission: RE | Admit: 2017-07-30 | Discharge: 2017-07-30 | Disposition: A | Discharge status: Home / Self Care | Payer: Medicare Other | Source: Ambulatory Visit | Attending: Radiation Oncology | Admitting: Radiation Oncology

## 2017-07-30 DIAGNOSIS — C61 Malignant neoplasm of prostate: Secondary | ICD-10-CM | POA: Diagnosis not present

## 2017-07-30 DIAGNOSIS — Z510 Encounter for antineoplastic radiation therapy: Secondary | ICD-10-CM | POA: Diagnosis not present

## 2017-07-30 IMAGING — CR DG CHEST 2V
2 series · 2 of 2 positions shown · non-contrast
Comparison: Two-view chest x-ray 12/05/2009

CLINICAL DATA: Kidney infection. Weakness. Fall yesterday.
Metabolic acid E media.

EXAM:
CHEST - 2 VIEW

[w chest pa]
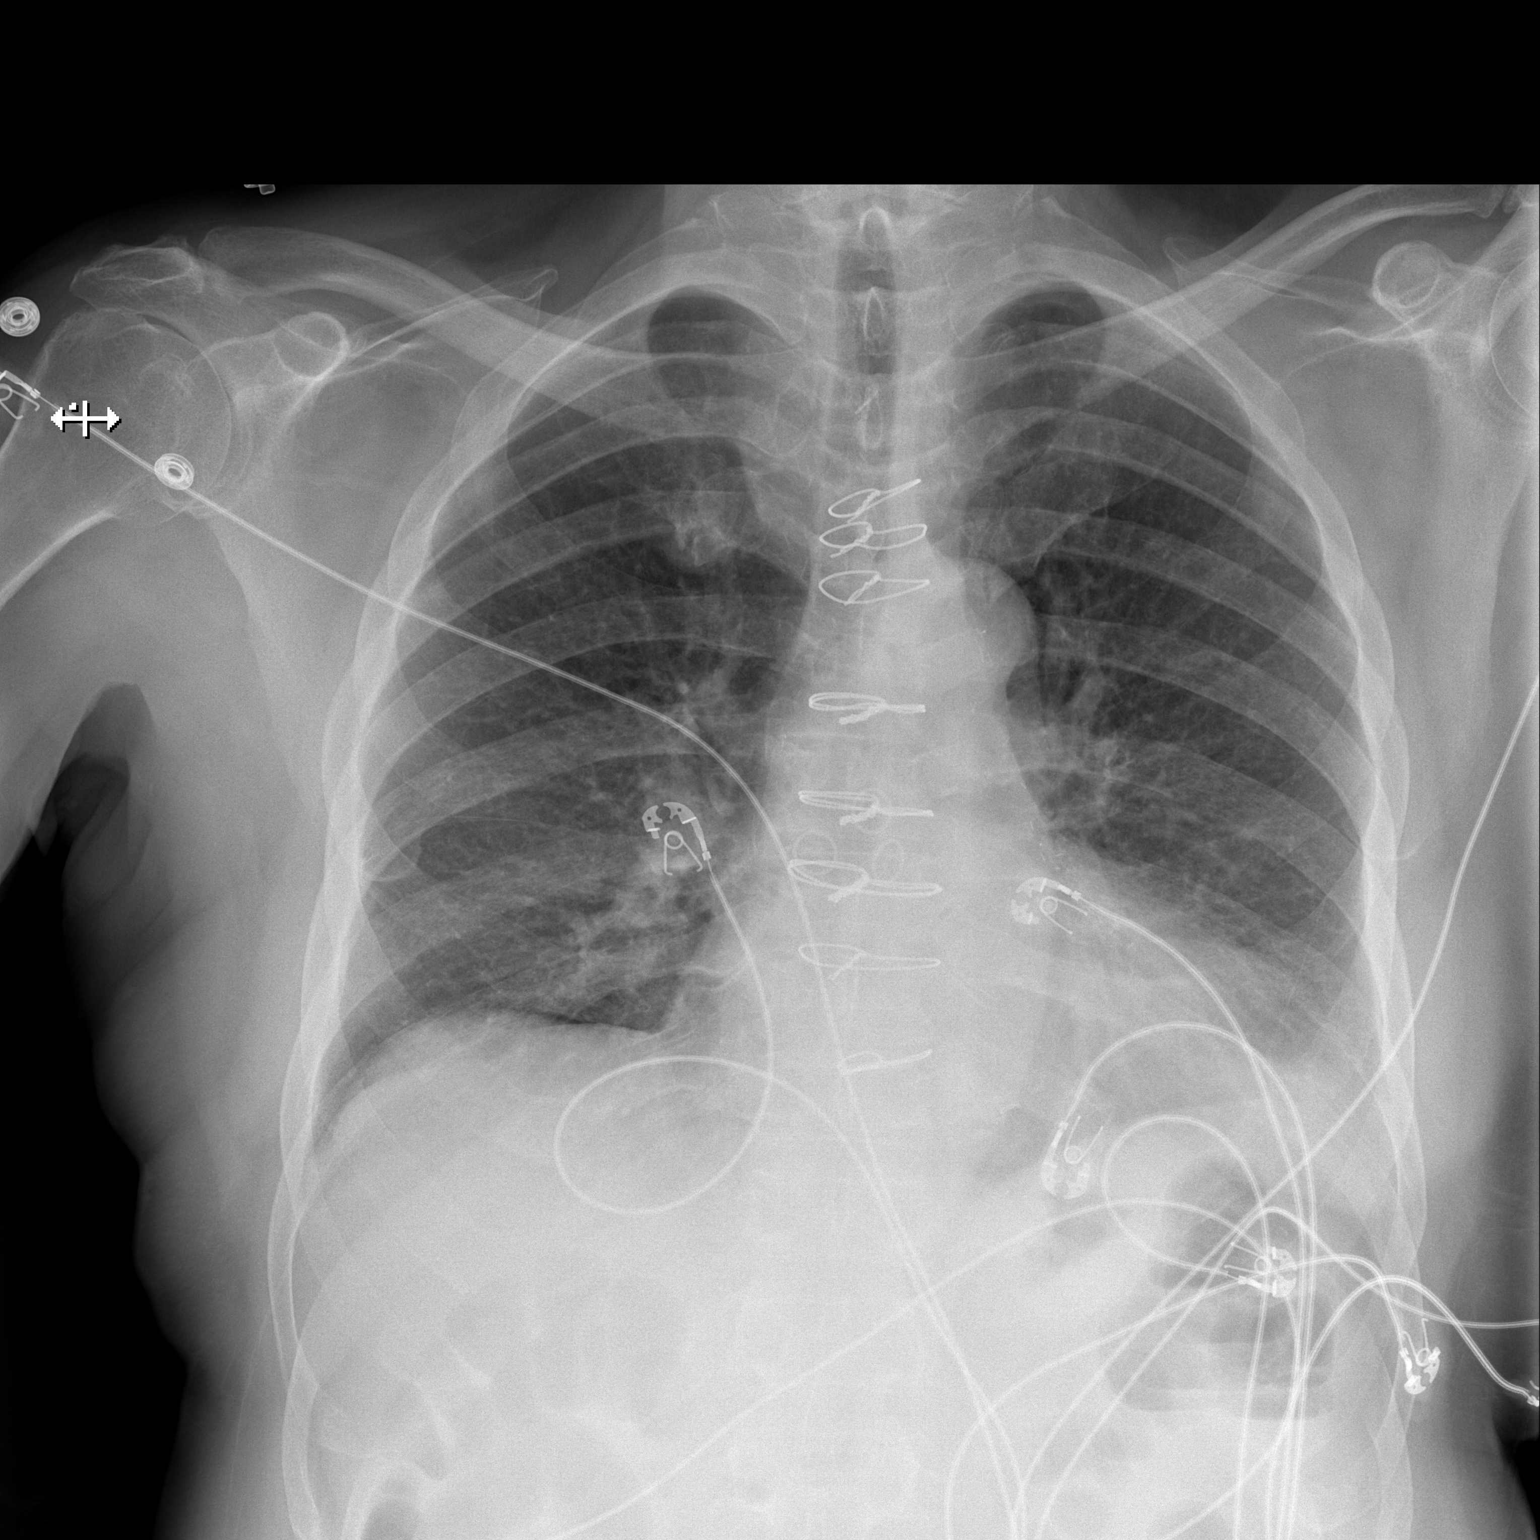

[w chest lat]
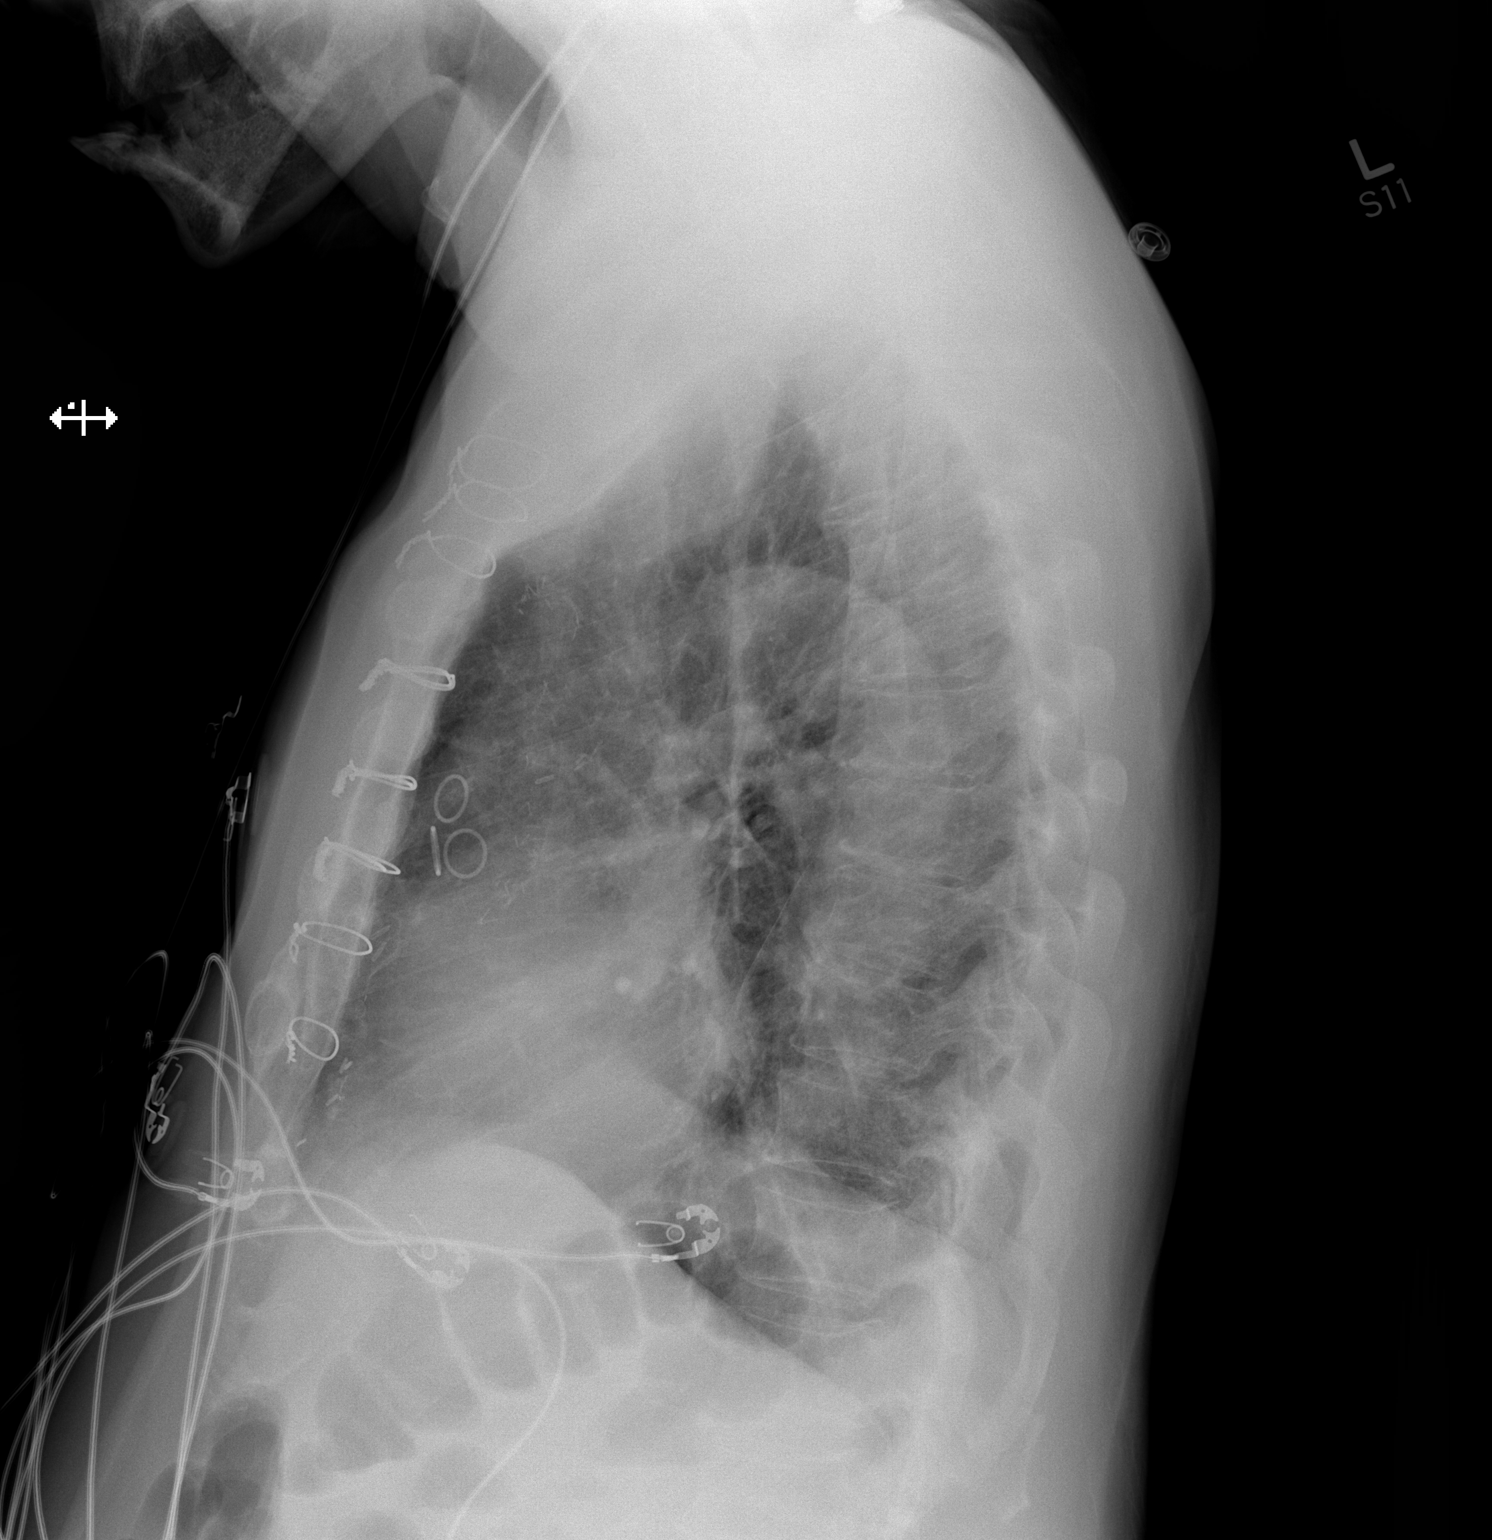

[2 of 2 positions shown; findings below may reference images not displayed]

FINDINGS: The heart size is exaggerated by low lung volumes. Median sternotomy
for CABG is noted. Mild bibasilar airspace disease is worse on the
left. The upper lung fields are clear. There is no edema or effusion
to suggest failure. The visualized soft tissues and bony thorax are
unremarkable.
IMPRESSION: 1. Low lung volumes and mild bibasilar airspace opacities likely
reflect atelectasis. Early infection is considered less likely, but
not excluded.
2. Median sternotomy for CABG.

## 2017-07-31 ENCOUNTER — Ambulatory Visit
Admission: RE | Admit: 2017-07-31 | Discharge: 2017-07-31 | Disposition: A | Discharge status: Home / Self Care | Payer: Medicare Other | Source: Ambulatory Visit | Attending: Radiation Oncology | Admitting: Radiation Oncology

## 2017-07-31 DIAGNOSIS — Z510 Encounter for antineoplastic radiation therapy: Secondary | ICD-10-CM | POA: Diagnosis not present

## 2017-07-31 DIAGNOSIS — C61 Malignant neoplasm of prostate: Secondary | ICD-10-CM | POA: Diagnosis not present

## 2017-07-31 IMAGING — CT CT ABD-PELV W/O CM
2 of 4 series · 16 of 46 positions shown, 18 images · non-contrast
Comparison: Renal ultrasound 01/25/2015. Abdominal pelvic CT
08/29/2010

CLINICAL DATA: Worsening left flank pain for 3 days. Bilateral
hydronephrosis on ultrasound. History of coronary artery disease,
chronic kidney disease and urethral stricture with stenting.

EXAM:
CT ABDOMEN AND PELVIS WITHOUT CONTRAST
TECHNIQUE: Multidetector CT imaging of the abdomen and pelvis was performed
following the standard protocol without IV contrast.

[Series 2: rtn a/p w/o · axial · non-contrast · 0.78mm/px · z∈[-473,-33]mm · 13 of 97 slices shown, 15 images]
[im 5/97  soft-tissue]
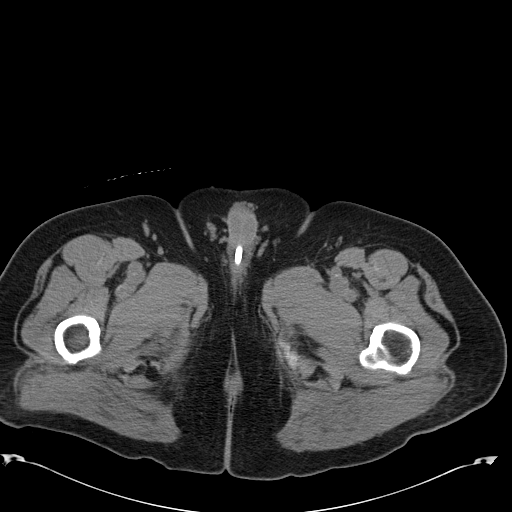
[im 5/97  bone]
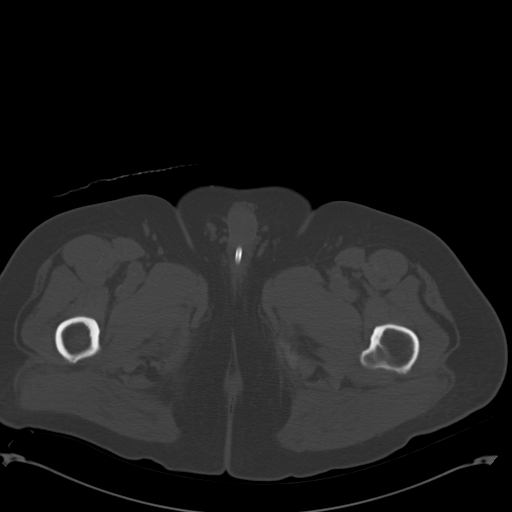
[im 13/97  soft-tissue]
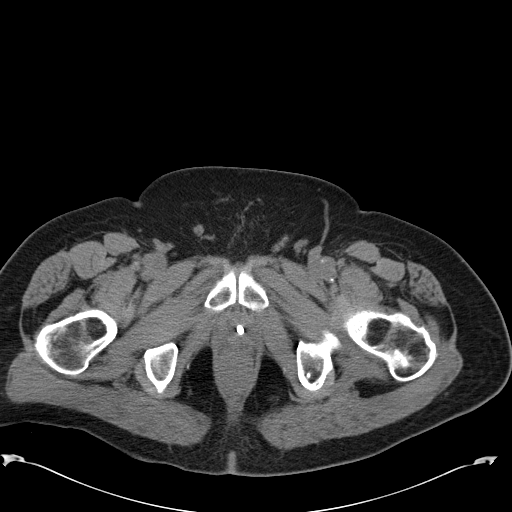
[im 21/97  soft-tissue]
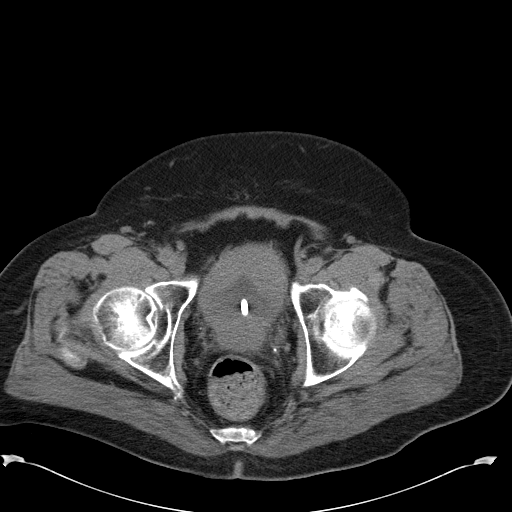
[im 29/97  soft-tissue]
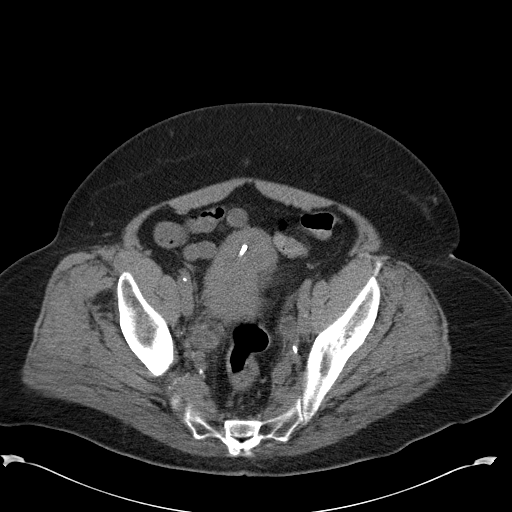
[im 33/97  soft-tissue]
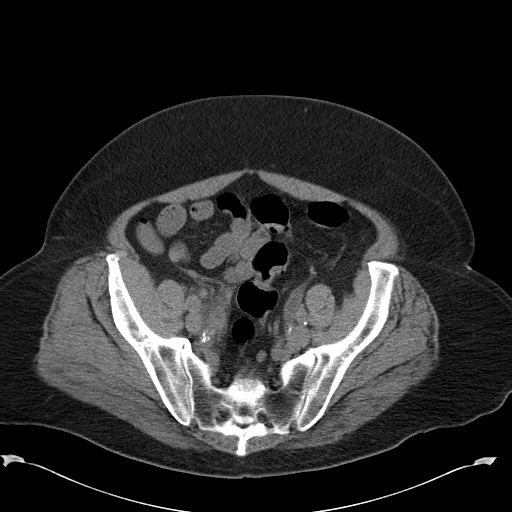
[im 41/97  soft-tissue]
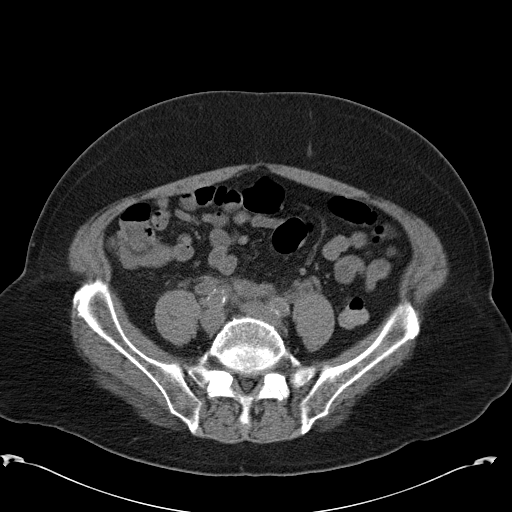
[im 49/97  soft-tissue]
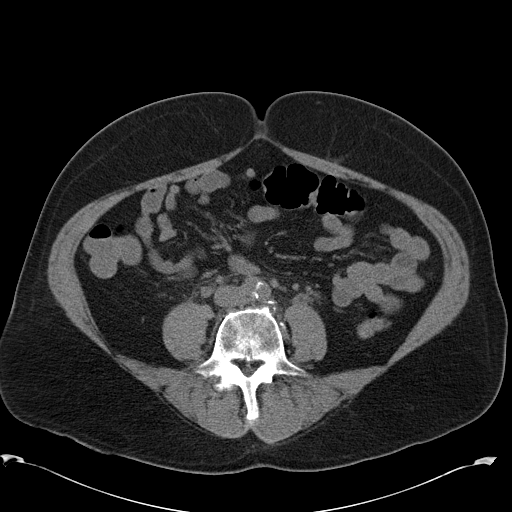
[im 57/97  soft-tissue]
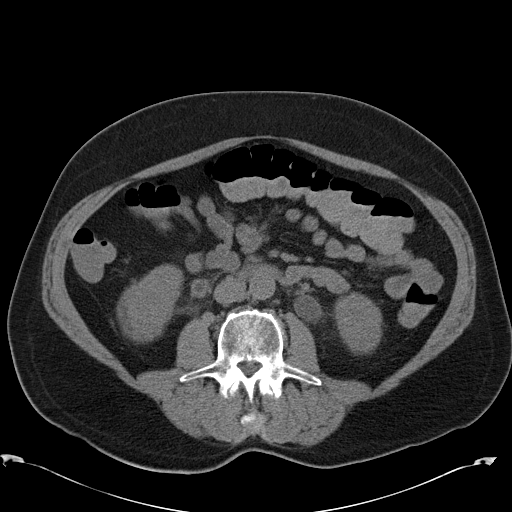
[im 65/97  soft-tissue]
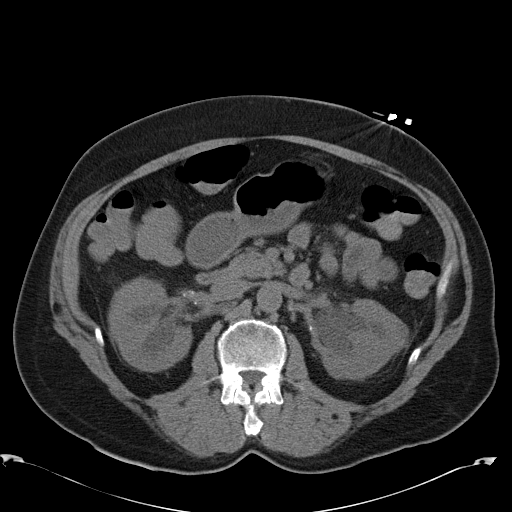
[im 65/97  bone]
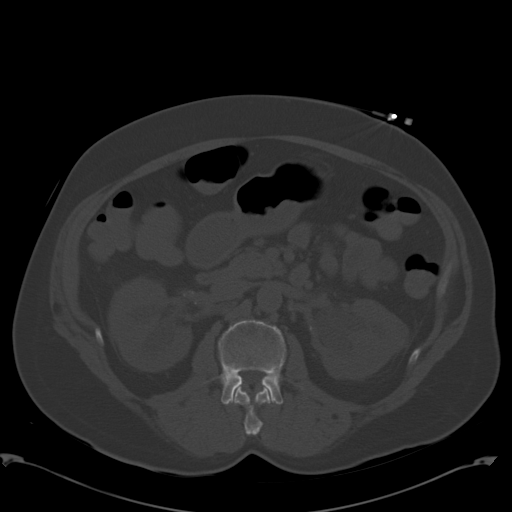
[im 69/97  soft-tissue]
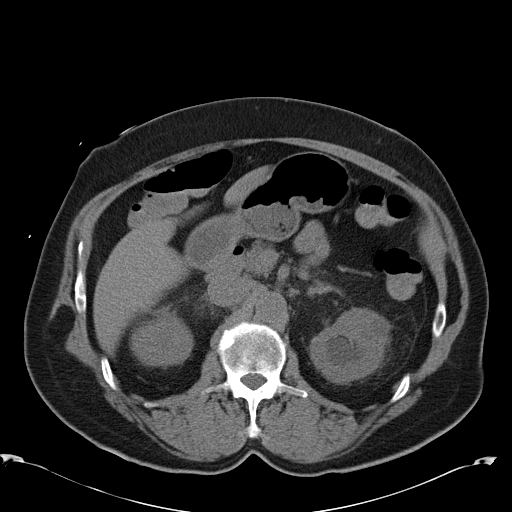
[im 77/97  soft-tissue]
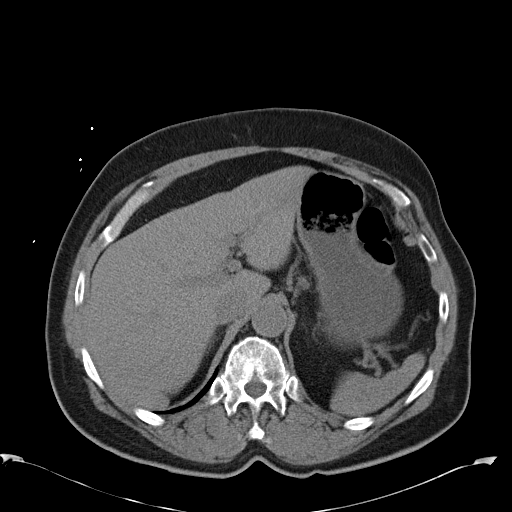
[im 85/97  soft-tissue]
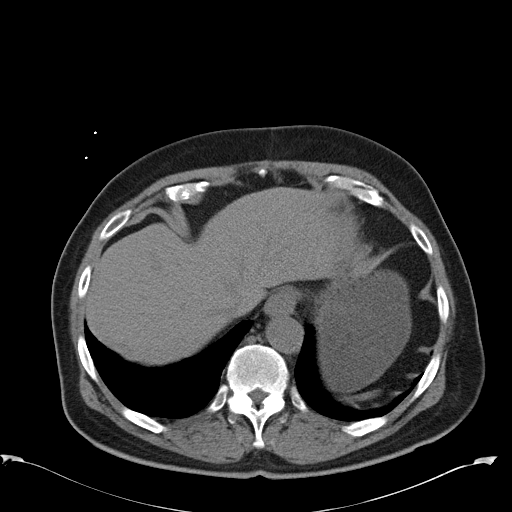
[im 93/97  soft-tissue]
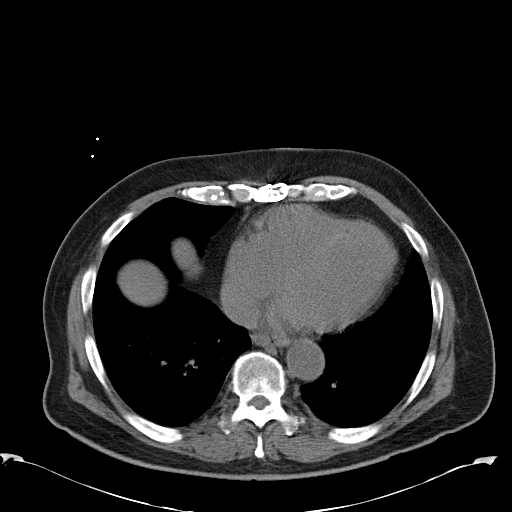

[Series 602: <mpr thick range> · coronal · 0.94mm/px · 3 of 96 slices shown]
[im 32/96  soft-tissue]
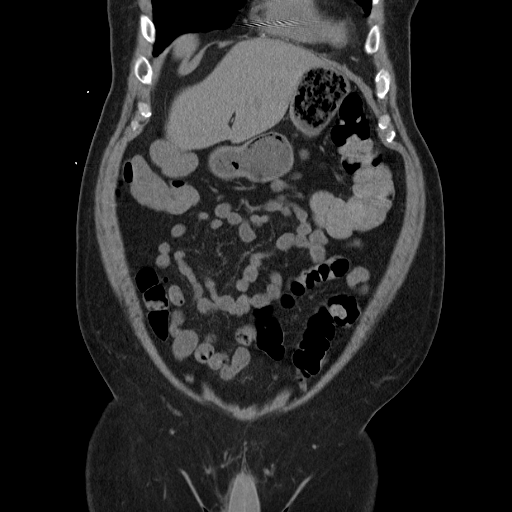
[im 43/96  soft-tissue]
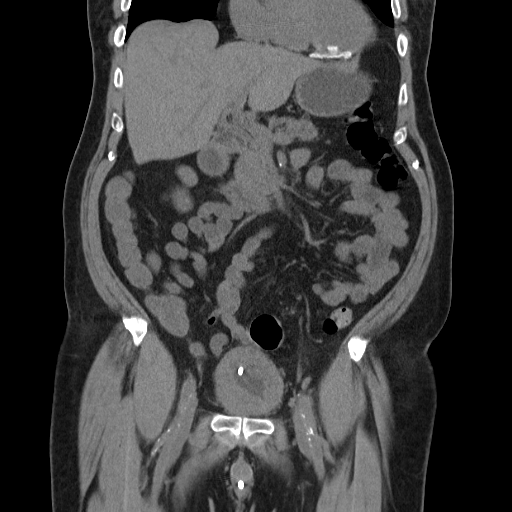
[im 53/96  soft-tissue]
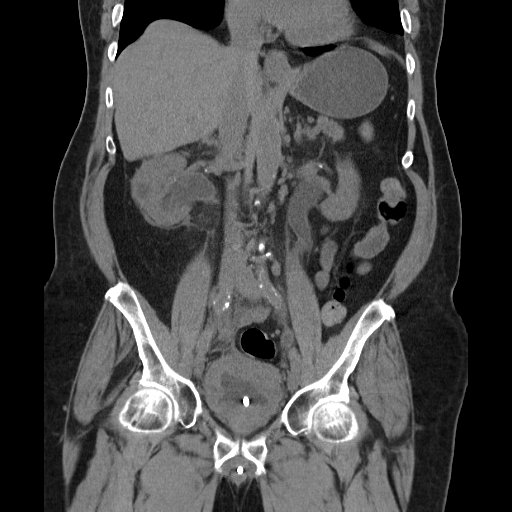

[16 of 46 positions shown; findings below may reference images not displayed]

FINDINGS: Lower chest: Clear lung bases. No significant pleural or pericardial
effusion. Dense calcifications are again noted at cardiac apex.
While some of these appear to be within the pericardium on the
reformatted images, there is probable extension into the myocardium
as well. Previous median sternotomy.

Hepatobiliary: As evaluated in the noncontrast state, the liver
appears unremarkable without focal abnormality. The gallbladder is
contracted without evidence of gallstones, surrounding inflammation
or associated biliary dilatation.

Pancreas: Progressive atrophy and dilatation of the main pancreatic
duct. No focal pancreatic mass or surrounding inflammatory change
demonstrated on noncontrast imaging.

Spleen: Normal in size without focal abnormality.

Adrenals/Urinary Tract: Both adrenal glands appear normal. Chronic
hydronephrosis and hydroureter again noted, now more symmetric. Both
kidneys demonstrate perinephric soft tissue stranding. There is
asymmetric ureteral wall thickening on the right. There is severe
concentric bladder wall thickening which has progressed. No urinary
tract calculi are seen. Bladder catheter is in place.

Stomach/Bowel: No evidence of bowel wall thickening, distention or
surrounding inflammatory change. The appendix appears normal.

Vascular/Lymphatic: Small retroperitoneal lymph nodes are noted, not
pathologically enlarged. There is no progressive retroperitoneal
soft tissue stranding. Mild aortoiliac atherosclerosis appears
unchanged without associated aneurysm.

Reproductive: Stable mild enlargement of the prostate gland with
central dystrophic calcifications.

Other: No evidence of abdominal wall mass or hernia.

Musculoskeletal: No acute or significant osseous findings.
IMPRESSION: 1. Chronic or recurrent bilateral hydronephrosis and hydroureter,
similar to prior CT from 7857. There is progressive asymmetric right
ureteral wall and bladder wall thickening. Findings could be
secondary to chronic bladder outlet obstruction from reported
urethral stricture, with superimposed chronic inflammation, although
neoplasm cannot be excluded. Cystoscopy and consideration of
retrograde ureteral evaluation recommended.
2. Stable aortoiliac atherosclerosis and mild retroperitoneal soft
tissue stranding. No progressive retroperitoneal fibrosis
identified.
3. Calcifications of the left cardiac apex, likely a combination of
myocardial and pericardial calcification.

## 2017-08-03 ENCOUNTER — Ambulatory Visit
Admission: RE | Admit: 2017-08-03 | Discharge: 2017-08-03 | Disposition: A | Discharge status: Home / Self Care | Payer: Medicare Other | Source: Ambulatory Visit | Attending: Radiation Oncology | Admitting: Radiation Oncology

## 2017-08-03 DIAGNOSIS — Z510 Encounter for antineoplastic radiation therapy: Secondary | ICD-10-CM | POA: Diagnosis not present

## 2017-08-03 DIAGNOSIS — C61 Malignant neoplasm of prostate: Secondary | ICD-10-CM | POA: Diagnosis not present

## 2017-08-04 ENCOUNTER — Ambulatory Visit
Admission: RE | Admit: 2017-08-04 | Discharge: 2017-08-04 | Disposition: A | Discharge status: Home / Self Care | Payer: Medicare Other | Source: Ambulatory Visit | Attending: Radiation Oncology | Admitting: Radiation Oncology

## 2017-08-04 DIAGNOSIS — Z510 Encounter for antineoplastic radiation therapy: Secondary | ICD-10-CM | POA: Diagnosis not present

## 2017-08-04 DIAGNOSIS — C61 Malignant neoplasm of prostate: Secondary | ICD-10-CM | POA: Diagnosis not present

## 2017-08-05 ENCOUNTER — Ambulatory Visit
Admission: RE | Admit: 2017-08-05 | Discharge: 2017-08-05 | Disposition: A | Discharge status: Home / Self Care | Payer: Medicare Other | Source: Ambulatory Visit | Attending: Radiation Oncology | Admitting: Radiation Oncology

## 2017-08-05 DIAGNOSIS — Z510 Encounter for antineoplastic radiation therapy: Secondary | ICD-10-CM | POA: Diagnosis not present

## 2017-08-05 DIAGNOSIS — C61 Malignant neoplasm of prostate: Secondary | ICD-10-CM | POA: Diagnosis not present

## 2017-08-06 ENCOUNTER — Encounter: Payer: Self-pay | Admitting: Medical Oncology

## 2017-08-06 ENCOUNTER — Ambulatory Visit: Admission: RE | Admit: 2017-08-06 | Payer: Medicare Other | Source: Ambulatory Visit

## 2017-08-07 ENCOUNTER — Ambulatory Visit
Admission: RE | Admit: 2017-08-07 | Discharge: 2017-08-07 | Disposition: A | Discharge status: Home / Self Care | Payer: Medicare Other | Source: Ambulatory Visit | Attending: Radiation Oncology | Admitting: Radiation Oncology

## 2017-08-07 DIAGNOSIS — C61 Malignant neoplasm of prostate: Secondary | ICD-10-CM | POA: Diagnosis not present

## 2017-08-07 DIAGNOSIS — Z510 Encounter for antineoplastic radiation therapy: Secondary | ICD-10-CM | POA: Diagnosis not present

## 2017-08-10 ENCOUNTER — Other Ambulatory Visit: Payer: Self-pay | Admitting: *Deleted

## 2017-08-10 ENCOUNTER — Ambulatory Visit
Admission: RE | Admit: 2017-08-10 | Discharge: 2017-08-10 | Disposition: A | Discharge status: Home / Self Care | Payer: Medicare Other | Source: Ambulatory Visit | Attending: Radiation Oncology | Admitting: Radiation Oncology

## 2017-08-10 ENCOUNTER — Telehealth: Payer: Self-pay | Admitting: Radiation Oncology

## 2017-08-10 ENCOUNTER — Other Ambulatory Visit: Payer: Self-pay | Admitting: Radiation Oncology

## 2017-08-10 DIAGNOSIS — R350 Frequency of micturition: Secondary | ICD-10-CM

## 2017-08-10 DIAGNOSIS — R3915 Urgency of urination: Secondary | ICD-10-CM

## 2017-08-10 DIAGNOSIS — C61 Malignant neoplasm of prostate: Secondary | ICD-10-CM | POA: Diagnosis not present

## 2017-08-10 DIAGNOSIS — R300 Dysuria: Secondary | ICD-10-CM

## 2017-08-10 DIAGNOSIS — Z510 Encounter for antineoplastic radiation therapy: Secondary | ICD-10-CM | POA: Diagnosis not present

## 2017-08-10 LAB — URINALYSIS, COMPLETE (UACMP) WITH MICROSCOPIC
Bilirubin Urine: NEGATIVE
Glucose, UA: NEGATIVE mg/dL
Ketones, ur: NEGATIVE mg/dL
Nitrite: NEGATIVE
PH: 6 (ref 5.0–8.0)
Protein, ur: 100 mg/dL — AB
SPECIFIC GRAVITY, URINE: 1.001 — AB (ref 1.005–1.030)
WBC, UA: >50 WBC/hpf — ABNORMAL HIGH (ref 0–5)

## 2017-08-10 MED ORDER — CIPROFLOXACIN HCL 500 MG PO TABS: 500.0000 mg | ORAL_TABLET | Freq: Two times a day (BID) | ORAL | 0 refills | Status: DC

## 2017-08-10 NOTE — Progress Notes (Signed)
Received patient in the clinic following 9th prostate treatment. Patient states, "something is wrong I pee every 15 minutes and it cloudy." Patient reports nocturia "every 15 minutes. " Reports urinary urgency and leakage worse at night. Reports urine appears cloudy. Denies diarrhea. Preferred pharmacy is [facility], [facility]. Urine sample collected for analysis and culture. Patient understands preliminary results will be back tomorrow.

## 2017-08-10 NOTE — Telephone Encounter (Signed)
Phoned patient's preferred [facility] pharmacy and left a detail message with new prescription information for Cipro. Then, phoned patient's cell and explained his script should be ready for pick up within the hours. Understanding verbalized.

## 2017-08-10 NOTE — Progress Notes (Signed)
Urinalysis returned positive for Moderate Hgb, 100 protein, large amounts of leukocytes, >50 WBC, and many bacteria. Reported findings to Dr. Tammi Klippel. Verbal order for Cipro 500 mg bid x 10 days given. Will call patient with an update.

## 2017-08-11 ENCOUNTER — Ambulatory Visit
Admission: RE | Admit: 2017-08-11 | Discharge: 2017-08-11 | Disposition: A | Discharge status: Home / Self Care | Payer: Medicare Other | Source: Ambulatory Visit | Attending: Radiation Oncology | Admitting: Radiation Oncology

## 2017-08-11 DIAGNOSIS — Z510 Encounter for antineoplastic radiation therapy: Secondary | ICD-10-CM | POA: Diagnosis not present

## 2017-08-11 DIAGNOSIS — C61 Malignant neoplasm of prostate: Secondary | ICD-10-CM | POA: Diagnosis not present

## 2017-08-12 ENCOUNTER — Ambulatory Visit
Admission: RE | Admit: 2017-08-12 | Discharge: 2017-08-12 | Disposition: A | Discharge status: Home / Self Care | Payer: Medicare Other | Source: Ambulatory Visit | Attending: Radiation Oncology | Admitting: Radiation Oncology

## 2017-08-12 DIAGNOSIS — C61 Malignant neoplasm of prostate: Secondary | ICD-10-CM | POA: Diagnosis not present

## 2017-08-12 DIAGNOSIS — Z510 Encounter for antineoplastic radiation therapy: Secondary | ICD-10-CM | POA: Diagnosis not present

## 2017-08-12 LAB — URINE CULTURE: Culture: >=100000 — AB

## 2017-08-13 ENCOUNTER — Ambulatory Visit
Admission: RE | Admit: 2017-08-13 | Discharge: 2017-08-13 | Disposition: A | Discharge status: Home / Self Care | Payer: Medicare Other | Source: Ambulatory Visit | Attending: Radiation Oncology | Admitting: Radiation Oncology

## 2017-08-13 ENCOUNTER — Encounter: Payer: Self-pay | Admitting: Radiation Oncology

## 2017-08-13 DIAGNOSIS — Z510 Encounter for antineoplastic radiation therapy: Secondary | ICD-10-CM | POA: Insufficient documentation

## 2017-08-13 DIAGNOSIS — C61 Malignant neoplasm of prostate: Secondary | ICD-10-CM | POA: Insufficient documentation

## 2017-08-13 NOTE — Progress Notes (Signed)
Urine culture proved infection is sensitive to Cipro.

## 2017-08-14 ENCOUNTER — Ambulatory Visit
Admission: RE | Admit: 2017-08-14 | Discharge: 2017-08-14 | Disposition: A | Discharge status: Home / Self Care | Payer: Medicare Other | Source: Ambulatory Visit | Attending: Radiation Oncology | Admitting: Radiation Oncology

## 2017-08-14 ENCOUNTER — Encounter: Payer: Self-pay | Admitting: Medical Oncology

## 2017-08-14 DIAGNOSIS — Z510 Encounter for antineoplastic radiation therapy: Secondary | ICD-10-CM | POA: Diagnosis not present

## 2017-08-14 DIAGNOSIS — C61 Malignant neoplasm of prostate: Secondary | ICD-10-CM | POA: Diagnosis not present

## 2017-08-17 ENCOUNTER — Ambulatory Visit
Admission: RE | Admit: 2017-08-17 | Discharge: 2017-08-17 | Disposition: A | Discharge status: Home / Self Care | Payer: Medicare Other | Source: Ambulatory Visit | Attending: Radiation Oncology | Admitting: Radiation Oncology

## 2017-08-17 DIAGNOSIS — Z510 Encounter for antineoplastic radiation therapy: Secondary | ICD-10-CM | POA: Diagnosis not present

## 2017-08-17 DIAGNOSIS — C61 Malignant neoplasm of prostate: Secondary | ICD-10-CM | POA: Diagnosis not present

## 2017-08-18 ENCOUNTER — Ambulatory Visit
Admission: RE | Admit: 2017-08-18 | Discharge: 2017-08-18 | Disposition: A | Discharge status: Home / Self Care | Payer: Medicare Other | Source: Ambulatory Visit | Attending: Radiation Oncology | Admitting: Radiation Oncology

## 2017-08-18 DIAGNOSIS — Z510 Encounter for antineoplastic radiation therapy: Secondary | ICD-10-CM | POA: Diagnosis not present

## 2017-08-18 DIAGNOSIS — C61 Malignant neoplasm of prostate: Secondary | ICD-10-CM | POA: Diagnosis not present

## 2017-08-19 ENCOUNTER — Ambulatory Visit
Admission: RE | Admit: 2017-08-19 | Discharge: 2017-08-19 | Disposition: A | Discharge status: Home / Self Care | Payer: Medicare Other | Source: Ambulatory Visit | Attending: Radiation Oncology | Admitting: Radiation Oncology

## 2017-08-19 DIAGNOSIS — Z510 Encounter for antineoplastic radiation therapy: Secondary | ICD-10-CM | POA: Diagnosis not present

## 2017-08-19 DIAGNOSIS — C61 Malignant neoplasm of prostate: Secondary | ICD-10-CM | POA: Diagnosis not present

## 2017-08-20 ENCOUNTER — Ambulatory Visit
Admission: RE | Admit: 2017-08-20 | Discharge: 2017-08-20 | Disposition: A | Discharge status: Home / Self Care | Payer: Medicare Other | Source: Ambulatory Visit | Attending: Radiation Oncology | Admitting: Radiation Oncology

## 2017-08-20 DIAGNOSIS — Z510 Encounter for antineoplastic radiation therapy: Secondary | ICD-10-CM | POA: Diagnosis not present

## 2017-08-20 DIAGNOSIS — C61 Malignant neoplasm of prostate: Secondary | ICD-10-CM | POA: Diagnosis not present

## 2017-08-21 ENCOUNTER — Ambulatory Visit
Admission: RE | Admit: 2017-08-21 | Discharge: 2017-08-21 | Disposition: A | Discharge status: Home / Self Care | Payer: Medicare Other | Source: Ambulatory Visit | Attending: Radiation Oncology | Admitting: Radiation Oncology

## 2017-08-21 DIAGNOSIS — Z510 Encounter for antineoplastic radiation therapy: Secondary | ICD-10-CM | POA: Diagnosis not present

## 2017-08-21 DIAGNOSIS — C61 Malignant neoplasm of prostate: Secondary | ICD-10-CM | POA: Diagnosis not present

## 2017-08-24 ENCOUNTER — Ambulatory Visit
Admission: RE | Admit: 2017-08-24 | Discharge: 2017-08-24 | Disposition: A | Discharge status: Home / Self Care | Payer: Medicare Other | Source: Ambulatory Visit | Attending: Radiation Oncology | Admitting: Radiation Oncology

## 2017-08-24 DIAGNOSIS — C61 Malignant neoplasm of prostate: Secondary | ICD-10-CM | POA: Diagnosis not present

## 2017-08-24 DIAGNOSIS — Z510 Encounter for antineoplastic radiation therapy: Secondary | ICD-10-CM | POA: Diagnosis not present

## 2017-08-25 ENCOUNTER — Ambulatory Visit
Admission: RE | Admit: 2017-08-25 | Discharge: 2017-08-25 | Disposition: A | Discharge status: Home / Self Care | Payer: Medicare Other | Source: Ambulatory Visit | Attending: Radiation Oncology | Admitting: Radiation Oncology

## 2017-08-25 DIAGNOSIS — Z510 Encounter for antineoplastic radiation therapy: Secondary | ICD-10-CM | POA: Diagnosis not present

## 2017-08-25 DIAGNOSIS — C61 Malignant neoplasm of prostate: Secondary | ICD-10-CM | POA: Diagnosis not present

## 2017-08-26 ENCOUNTER — Ambulatory Visit
Admission: RE | Admit: 2017-08-26 | Discharge: 2017-08-26 | Disposition: A | Discharge status: Home / Self Care | Payer: Medicare Other | Source: Ambulatory Visit | Attending: Radiation Oncology | Admitting: Radiation Oncology

## 2017-08-26 DIAGNOSIS — C61 Malignant neoplasm of prostate: Secondary | ICD-10-CM | POA: Diagnosis not present

## 2017-08-26 DIAGNOSIS — Z510 Encounter for antineoplastic radiation therapy: Secondary | ICD-10-CM | POA: Diagnosis not present

## 2017-08-27 ENCOUNTER — Ambulatory Visit
Admission: RE | Admit: 2017-08-27 | Discharge: 2017-08-27 | Disposition: A | Discharge status: Home / Self Care | Payer: Medicare Other | Source: Ambulatory Visit | Attending: Radiation Oncology | Admitting: Radiation Oncology

## 2017-08-27 DIAGNOSIS — C61 Malignant neoplasm of prostate: Secondary | ICD-10-CM | POA: Diagnosis not present

## 2017-08-27 DIAGNOSIS — Z510 Encounter for antineoplastic radiation therapy: Secondary | ICD-10-CM | POA: Diagnosis not present

## 2017-08-28 ENCOUNTER — Ambulatory Visit
Admission: RE | Admit: 2017-08-28 | Discharge: 2017-08-28 | Disposition: A | Discharge status: Home / Self Care | Payer: Medicare Other | Source: Ambulatory Visit | Attending: Radiation Oncology | Admitting: Radiation Oncology

## 2017-08-28 DIAGNOSIS — Z510 Encounter for antineoplastic radiation therapy: Secondary | ICD-10-CM | POA: Diagnosis not present

## 2017-08-28 DIAGNOSIS — C61 Malignant neoplasm of prostate: Secondary | ICD-10-CM | POA: Diagnosis not present

## 2017-08-30 ENCOUNTER — Ambulatory Visit: Admission: RE | Admit: 2017-08-30 | Payer: Medicare Other | Source: Ambulatory Visit

## 2017-08-31 ENCOUNTER — Ambulatory Visit
Admission: RE | Admit: 2017-08-31 | Discharge: 2017-08-31 | Disposition: A | Discharge status: Home / Self Care | Payer: Medicare Other | Source: Ambulatory Visit | Attending: Radiation Oncology | Admitting: Radiation Oncology

## 2017-08-31 DIAGNOSIS — C61 Malignant neoplasm of prostate: Secondary | ICD-10-CM | POA: Diagnosis not present

## 2017-08-31 DIAGNOSIS — Z510 Encounter for antineoplastic radiation therapy: Secondary | ICD-10-CM | POA: Diagnosis not present

## 2017-09-01 ENCOUNTER — Ambulatory Visit: Payer: Medicare Other

## 2017-09-01 ENCOUNTER — Ambulatory Visit
Admission: RE | Admit: 2017-09-01 | Discharge: 2017-09-01 | Disposition: A | Discharge status: Home / Self Care | Payer: Medicare Other | Source: Ambulatory Visit | Attending: Radiation Oncology | Admitting: Radiation Oncology

## 2017-09-01 DIAGNOSIS — C61 Malignant neoplasm of prostate: Secondary | ICD-10-CM | POA: Diagnosis not present

## 2017-09-01 DIAGNOSIS — Z510 Encounter for antineoplastic radiation therapy: Secondary | ICD-10-CM | POA: Diagnosis not present

## 2017-09-02 ENCOUNTER — Ambulatory Visit: Payer: Medicare Other

## 2017-09-02 ENCOUNTER — Ambulatory Visit
Admission: RE | Admit: 2017-09-02 | Discharge: 2017-09-02 | Disposition: A | Discharge status: Home / Self Care | Payer: Medicare Other | Source: Ambulatory Visit | Attending: Radiation Oncology | Admitting: Radiation Oncology

## 2017-09-02 DIAGNOSIS — Z510 Encounter for antineoplastic radiation therapy: Secondary | ICD-10-CM | POA: Diagnosis not present

## 2017-09-02 DIAGNOSIS — C61 Malignant neoplasm of prostate: Secondary | ICD-10-CM | POA: Diagnosis not present

## 2017-09-03 ENCOUNTER — Encounter: Payer: Self-pay | Admitting: General Practice

## 2017-09-03 ENCOUNTER — Ambulatory Visit
Admission: RE | Admit: 2017-09-03 | Discharge: 2017-09-03 | Disposition: A | Discharge status: Home / Self Care | Payer: Medicare Other | Source: Ambulatory Visit | Attending: Radiation Oncology | Admitting: Radiation Oncology

## 2017-09-03 DIAGNOSIS — C61 Malignant neoplasm of prostate: Secondary | ICD-10-CM | POA: Diagnosis not present

## 2017-09-03 DIAGNOSIS — Z510 Encounter for antineoplastic radiation therapy: Secondary | ICD-10-CM | POA: Diagnosis not present

## 2017-09-03 NOTE — Progress Notes (Signed)
[place] CSW Progress Notes  Patient requests help w past due electric and water bills.  Per record and patient statement, he has used full amount of [employer].  As patient needs help w funds today, he has been referred to emergency assistance providers in [place] including [employer] (referral letter provided) and Solicitor (via [employer]).  Patient states this is a one time occurrence due to unexpected expenses in this past month.  Lives with son and is responsible for specific bills.   Edwyna Shell, LCSW Clinical Social Worker Phone:  [phone]

## 2017-09-04 ENCOUNTER — Ambulatory Visit
Admission: RE | Admit: 2017-09-04 | Discharge: 2017-09-04 | Disposition: A | Discharge status: Home / Self Care | Payer: Medicare Other | Source: Ambulatory Visit | Attending: Radiation Oncology | Admitting: Radiation Oncology

## 2017-09-04 DIAGNOSIS — C61 Malignant neoplasm of prostate: Secondary | ICD-10-CM | POA: Diagnosis not present

## 2017-09-04 DIAGNOSIS — Z510 Encounter for antineoplastic radiation therapy: Secondary | ICD-10-CM | POA: Diagnosis not present

## 2017-09-07 ENCOUNTER — Ambulatory Visit
Admission: RE | Admit: 2017-09-07 | Discharge: 2017-09-07 | Disposition: A | Discharge status: Home / Self Care | Payer: Medicare Other | Source: Ambulatory Visit | Attending: Radiation Oncology | Admitting: Radiation Oncology

## 2017-09-07 DIAGNOSIS — Z510 Encounter for antineoplastic radiation therapy: Secondary | ICD-10-CM | POA: Diagnosis not present

## 2017-09-07 DIAGNOSIS — C61 Malignant neoplasm of prostate: Secondary | ICD-10-CM | POA: Diagnosis not present

## 2017-09-08 ENCOUNTER — Ambulatory Visit
Admission: RE | Admit: 2017-09-08 | Discharge: 2017-09-08 | Disposition: A | Discharge status: Home / Self Care | Payer: Medicare Other | Source: Ambulatory Visit | Attending: Radiation Oncology | Admitting: Radiation Oncology

## 2017-09-08 DIAGNOSIS — C61 Malignant neoplasm of prostate: Secondary | ICD-10-CM | POA: Diagnosis not present

## 2017-09-08 DIAGNOSIS — Z510 Encounter for antineoplastic radiation therapy: Secondary | ICD-10-CM | POA: Diagnosis not present

## 2017-09-09 ENCOUNTER — Ambulatory Visit
Admission: RE | Admit: 2017-09-09 | Discharge: 2017-09-09 | Disposition: A | Discharge status: Home / Self Care | Payer: Medicare Other | Source: Ambulatory Visit | Attending: Radiation Oncology | Admitting: Radiation Oncology

## 2017-09-09 DIAGNOSIS — C61 Malignant neoplasm of prostate: Secondary | ICD-10-CM | POA: Diagnosis not present

## 2017-09-09 DIAGNOSIS — Z510 Encounter for antineoplastic radiation therapy: Secondary | ICD-10-CM | POA: Diagnosis not present

## 2017-09-10 ENCOUNTER — Ambulatory Visit
Admission: RE | Admit: 2017-09-10 | Discharge: 2017-09-10 | Disposition: A | Discharge status: Home / Self Care | Payer: Medicare Other | Source: Ambulatory Visit | Attending: Radiation Oncology | Admitting: Radiation Oncology

## 2017-09-10 DIAGNOSIS — C61 Malignant neoplasm of prostate: Secondary | ICD-10-CM | POA: Diagnosis not present

## 2017-09-10 DIAGNOSIS — Z510 Encounter for antineoplastic radiation therapy: Secondary | ICD-10-CM | POA: Diagnosis not present

## 2017-09-11 ENCOUNTER — Ambulatory Visit
Admission: RE | Admit: 2017-09-11 | Discharge: 2017-09-11 | Disposition: A | Discharge status: Home / Self Care | Payer: Medicare Other | Source: Ambulatory Visit | Attending: Radiation Oncology | Admitting: Radiation Oncology

## 2017-09-11 DIAGNOSIS — Z510 Encounter for antineoplastic radiation therapy: Secondary | ICD-10-CM | POA: Diagnosis not present

## 2017-09-11 DIAGNOSIS — C61 Malignant neoplasm of prostate: Secondary | ICD-10-CM | POA: Diagnosis not present

## 2017-09-11 IMAGING — DX DG CHEST 2V
2 series · 2 of 2 positions shown · non-contrast
Comparison: 01/25/2015

CLINICAL DATA: Preoperative respiratory exam for urology procedure.
Smoking history. Previous CABG.

EXAM:
CHEST  2 VIEW

[chest pa]
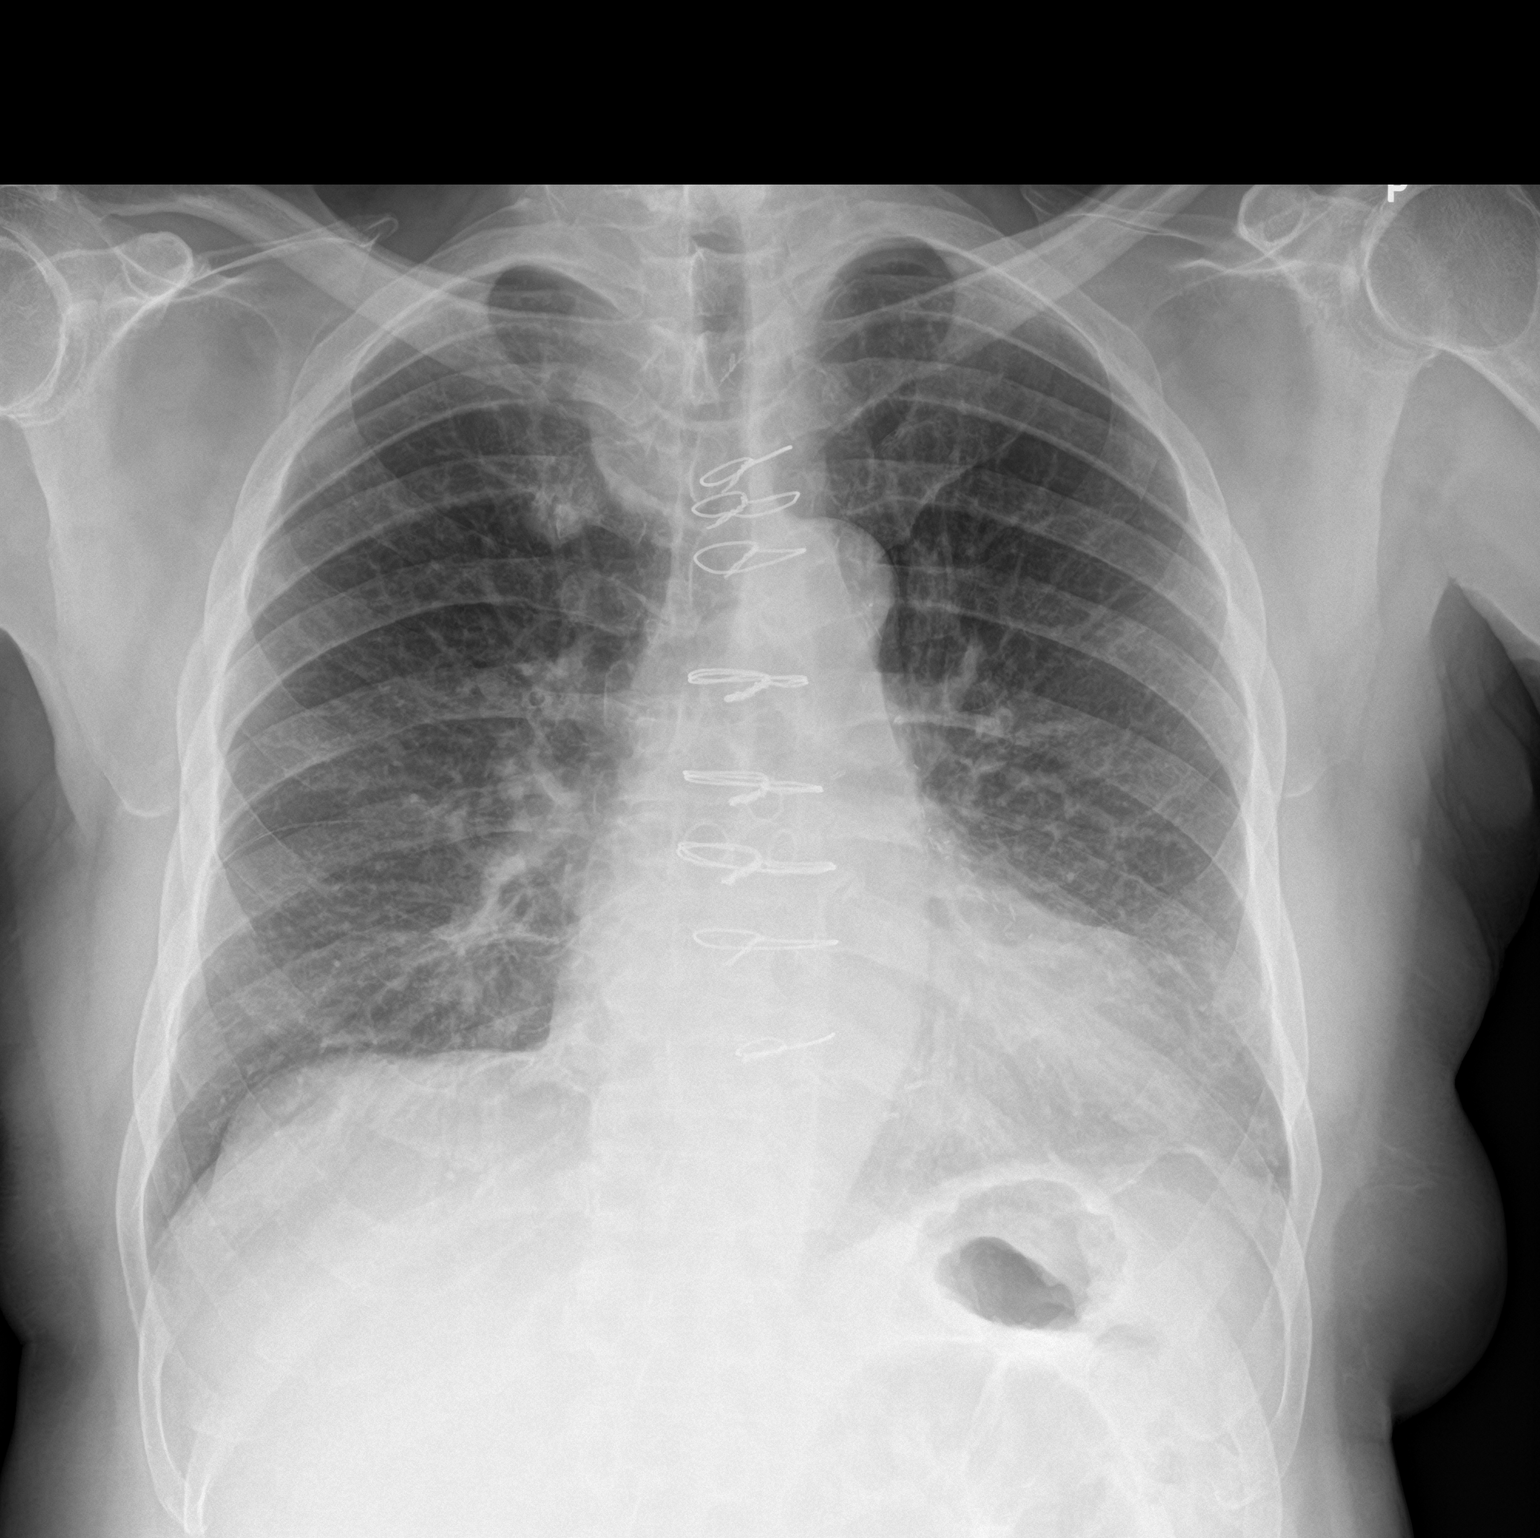

[chest lat]
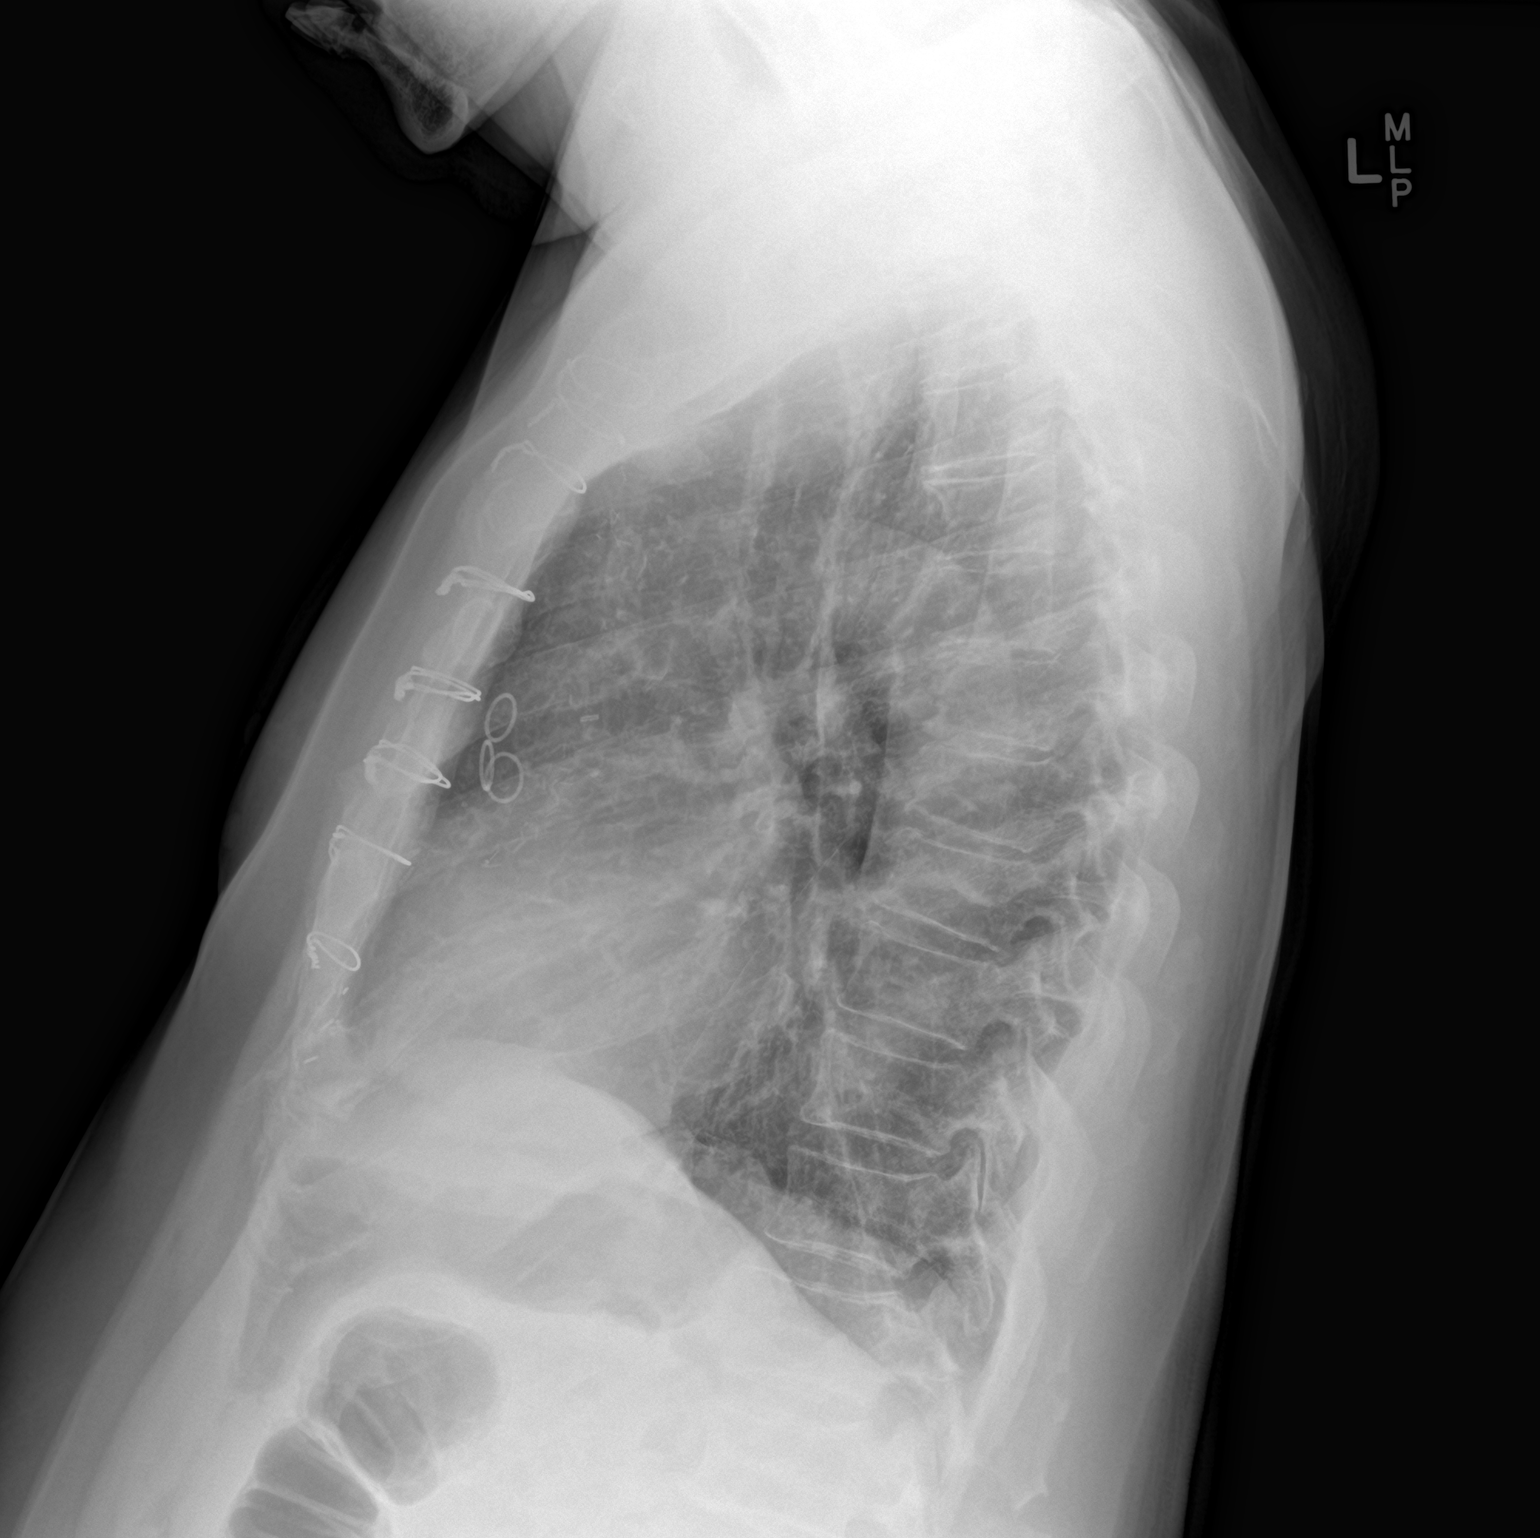

[2 of 2 positions shown; findings below may reference images not displayed]

FINDINGS: Previous median sternotomy and CABG. Left ventricular prominence.
Unfolded aorta. Normal pulmonary vasculature. No infiltrate,
effusion or collapse. Density associated with the right rib and. No
acute bone finding.
IMPRESSION: Previous CABG.  No active disease.

## 2017-09-15 ENCOUNTER — Ambulatory Visit
Admission: RE | Admit: 2017-09-15 | Discharge: 2017-09-15 | Disposition: A | Discharge status: Home / Self Care | Payer: Medicare Other | Source: Ambulatory Visit | Attending: Radiation Oncology | Admitting: Radiation Oncology

## 2017-09-15 DIAGNOSIS — C61 Malignant neoplasm of prostate: Secondary | ICD-10-CM | POA: Diagnosis not present

## 2017-09-15 DIAGNOSIS — Z510 Encounter for antineoplastic radiation therapy: Secondary | ICD-10-CM | POA: Insufficient documentation

## 2017-09-16 ENCOUNTER — Ambulatory Visit
Admission: RE | Admit: 2017-09-16 | Discharge: 2017-09-16 | Disposition: A | Discharge status: Home / Self Care | Payer: Medicare Other | Source: Ambulatory Visit | Attending: Radiation Oncology | Admitting: Radiation Oncology

## 2017-09-16 DIAGNOSIS — C61 Malignant neoplasm of prostate: Secondary | ICD-10-CM | POA: Diagnosis not present

## 2017-09-16 DIAGNOSIS — Z510 Encounter for antineoplastic radiation therapy: Secondary | ICD-10-CM | POA: Diagnosis not present

## 2017-09-17 ENCOUNTER — Ambulatory Visit
Admission: RE | Admit: 2017-09-17 | Discharge: 2017-09-17 | Disposition: A | Discharge status: Home / Self Care | Payer: Medicare Other | Source: Ambulatory Visit | Attending: Radiation Oncology | Admitting: Radiation Oncology

## 2017-09-17 DIAGNOSIS — Z510 Encounter for antineoplastic radiation therapy: Secondary | ICD-10-CM | POA: Diagnosis not present

## 2017-09-17 DIAGNOSIS — C61 Malignant neoplasm of prostate: Secondary | ICD-10-CM | POA: Diagnosis not present

## 2017-09-18 ENCOUNTER — Ambulatory Visit
Admission: RE | Admit: 2017-09-18 | Discharge: 2017-09-18 | Disposition: A | Discharge status: Home / Self Care | Payer: Medicare Other | Source: Ambulatory Visit | Attending: Radiation Oncology | Admitting: Radiation Oncology

## 2017-09-18 DIAGNOSIS — Z510 Encounter for antineoplastic radiation therapy: Secondary | ICD-10-CM | POA: Diagnosis not present

## 2017-09-18 DIAGNOSIS — C61 Malignant neoplasm of prostate: Secondary | ICD-10-CM | POA: Diagnosis not present

## 2017-09-21 ENCOUNTER — Ambulatory Visit
Admission: RE | Admit: 2017-09-21 | Discharge: 2017-09-21 | Disposition: A | Discharge status: Home / Self Care | Payer: Medicare Other | Source: Ambulatory Visit | Attending: Radiation Oncology | Admitting: Radiation Oncology

## 2017-09-21 DIAGNOSIS — Z510 Encounter for antineoplastic radiation therapy: Secondary | ICD-10-CM | POA: Diagnosis not present

## 2017-09-21 DIAGNOSIS — C61 Malignant neoplasm of prostate: Secondary | ICD-10-CM | POA: Diagnosis not present

## 2017-09-22 ENCOUNTER — Ambulatory Visit
Admission: RE | Admit: 2017-09-22 | Discharge: 2017-09-22 | Disposition: A | Discharge status: Home / Self Care | Payer: Medicare Other | Source: Ambulatory Visit | Attending: Radiation Oncology | Admitting: Radiation Oncology

## 2017-09-22 ENCOUNTER — Ambulatory Visit: Payer: Medicare Other

## 2017-09-22 DIAGNOSIS — Z510 Encounter for antineoplastic radiation therapy: Secondary | ICD-10-CM | POA: Diagnosis not present

## 2017-09-22 DIAGNOSIS — C61 Malignant neoplasm of prostate: Secondary | ICD-10-CM | POA: Diagnosis not present

## 2017-09-22 NOTE — Progress Notes (Signed)
HPI: FU CAD. Patient had an acute inferior MI in September 2011 due to occlusion of a PL branch. Cath with severe 3-V CAD with TIMI-3 flow in the PL branch. Post MI course c/b acute VSD and taken for emergency CABG x4 with LIMA to the LAD, SVG to the diagonal, SVG to the circumflex marginal, and SVG to the PDA as well as repair of ventricular septal defect in September 2011. Echo 3/14 showed EF 45%, LAE, mild MR and no residual VSD. Patient has been diagnosed with pancreatic cancer and prostate cancer.  Echo and nuclear study ordered at last ov but not performed. Since last seen,  patient denies dyspnea, chest pain, palpitations or syncope.  Current Outpatient Medications  Medication Sig Dispense Refill  . amLODipine (NORVASC) 10 MG tablet take 1/2 tablet by mouth once daily 30 tablet 11  . amLODipine (NORVASC) 5 MG tablet Take 5 mg by mouth daily.    . carvedilol (COREG) 12.5 MG tablet take 1 tablet by mouth twice a day with meals **KEEP OFFICE VISIT** 30 tablet 10  . rosuvastatin (CRESTOR) 40 MG tablet take 1 tablet by mouth once daily **KEEP OFFICE VISIT** 30 tablet 10   No current facility-administered medications for this visit.      Past Medical History:  Diagnosis Date  . Acute MI, inferior wall ([place]) 2011  . Amputation finger    right first finger top portion age 66  . Aortic atherosclerosis ([place])   . ARF (acute renal failure) ([place]) 01/24/2015  . CHF (congestive heart failure) ([place])   . CKD (chronic kidney disease), stage III ([place]) 11/2015  . Coronary artery disease cardioloigst-  dr Stanford Breed   hx inferior MI 10-08-2009 emergency CABG x4  . DOE (dyspnea on exertion)    since starting chemo  . DVT (deep venous thrombosis) ([place]) 11/05/2016   right proximal-mid peroneal veins  . Fracture of left hip ([place])   . H/O hyperkalemia   . History of acute inferior wall myocardial infarction 10/08/2009  . History of blood transfusion   . History of chemotherapy   . History of  echocardiogram 03/2012   EF 45%, LAE, Mild MR, no residual VSD  . History of first degree heart block   . History of metabolic acidosis   . History of thrombocytopenia   . History of urethral stricture   . History of UTI   . Hyperlipidemia   . Hypertension   . Ischemic cardiomyopathy   . Kidney cysts    bilateral  . Multiple pulmonary nodules    Bilateral scattered  . Multiple thyroid nodules   . Obstructed, uropathy   . Obstructive uropathy   . Pancreatic carcinoma (HCC)    s/p SBRT  . Pneumonia    Childhood  . Prostate cancer ([place])   . S/P VSD repair    09/2009  . Systolic CHF, chronic ([place])   . VSD (ventricular septal defect) 09/2009   acute after MI    Past Surgical History:  Procedure Laterality Date  . CARDIAC CATHETERIZATION    . CORONARY ARTERY BYPASS GRAFT     times 4  . CYSTOSCOPY W/ RETROGRADES N/A 04/03/2015   Procedure:  BLADDER BIOPSIES;  Surgeon: Festus Aloe, MD;  Location: [facility];  Service: Urology;  Laterality: N/A;  . CYSTOSCOPY W/ RETROGRADES Bilateral 06/27/2016   Procedure: CYSTOSCOPY WITH BILATERAL RETROGRADES;  Surgeon: Festus Aloe, MD;  Location: [facility];  Service: Urology;  Laterality: Bilateral;  . CYSTOSCOPY WITH  BIOPSY N/A 06/27/2016   Procedure: CYSTOSCOPY WITH BLADDER BIOPSY URETHRAL BIOPSY;  Surgeon: Festus Aloe, MD;  Location: [facility];  Service: Urology;  Laterality: N/A;  . CYSTOSCOPY WITH URETHRAL DILATATION N/A 04/03/2015   Procedure: CYSTOSCOPY WITH BILATERAL RETROGRADES;  Surgeon: Festus Aloe, MD;  Location: [facility];  Service: Urology;  Laterality: N/A;  . EUS N/A 08/28/2016   Procedure: UPPER ENDOSCOPIC ULTRASOUND (EUS) RADIAL;  Surgeon: Milus Banister, MD;  Location: [facility];  Service: Endoscopy;  Laterality: N/A;  . flexible cystoscopy    . GOLD SEED IMPLANT N/A 07/03/2017   Procedure: GOLD SEED IMPLANT;  Surgeon: Festus Aloe, MD;  Location: [facility];  Service: Urology;  Laterality: N/A;   Needs Ultrasound Tech  . repair of post infarction posterior ventricular septal defect  09/2009  . SPACE OAR INSTILLATION N/A 07/03/2017   Procedure: SPACE OAR INSTILLATION;  Surgeon: Festus Aloe, MD;  Location: [facility];  Service: Urology;  Laterality: N/A;  . TRANSURETHRAL RESECTION OF PROSTATE  06/27/2016   Procedure: TRANSURETHRAL RESECTION OF THE PROSTATE (TURP);  Surgeon: Festus Aloe, MD;  Location: [facility];  Service: Urology;;  . UPPER GI ENDOSCOPY      Social History   Socioeconomic History  . Marital status: Divorced    Spouse name: Not on file  . Number of children: Not on file  . Years of education: Not on file  . Highest education level: Not on file  Occupational History  . Not on file  Social Needs  . Financial resource strain: Not on file  . Food insecurity:    Worry: Not on file    Inability: Not on file  . Transportation needs:    Medical: Not on file    Non-medical: Not on file  Tobacco Use  . Smoking status: Former Smoker    Packs/day: 1.50    Years: 45.00    Pack years: 67.50    Types: Cigarettes    Last attempt to quit: 01/13/2010    Years since quitting: 7.7  . Smokeless tobacco: Never Used  Substance and Sexual Activity  . Alcohol use: No    Comment: occasional   . Drug use: No  . Sexual activity: Not on file  Lifestyle  . Physical activity:    Days per week: Not on file    Minutes per session: Not on file  . Stress: Not on file  Relationships  . Social connections:    Talks on phone: Not on file    Gets together: Not on file    Attends religious service: Not on file    Active member of club or organization: Not on file    Attends meetings of clubs or organizations: Not on file    Relationship status: Not on file  . Intimate partner violence:    Fear of current or ex partner: Not on file    Emotionally abused: Not on file    Physically abused: Not on file    Forced sexual activity: Not on file  Other Topics  Concern  . Not on file  Social History Narrative  . Not on file    Family History  Problem Relation Age of Onset  . Cancer Paternal Aunt        unknown type cancer   . Cancer Paternal Uncle        unknown type cancer    ROS: no fevers or chills, productive cough, hemoptysis, dysphasia, odynophagia, melena, hematochezia, dysuria, hematuria, rash,  seizure activity, orthopnea, PND, pedal edema, claudication. Remaining systems are negative.  Physical Exam: Well-developed well-nourished in no acute distress.  Skin is warm and dry.  HEENT is normal.  Neck is supple.  Chest is clear to auscultation with normal expansion.  Cardiovascular exam is regular rate and rhythm.  Abdominal exam nontender or distended. No masses palpated. Extremities show no edema. neuro grossly intact  ECG-sinus rhythm at a rate of 78.  First-degree AV block.  Inferior infarct.  Lateral T wave inversion.  Personally reviewed  A/P  1 coronary artery disease-continue statin; resume aspirin 81 mg daily..  2 ischemic cardiomyopathy-plan to continue beta-blocker.  He is not on an ACE inhibitor or ARB given renal insufficiency.  He did not come for his echocardiogram ordered last year.  We will reorder.  3 history of myocardial infarction complicated by ventricular septal defect-status post repair.  Repeat echocardiogram.  4 hypertension-patient's blood pressure is controlled.  Continue present medications.  5 hyperlipidemia-continue statin.  Check lipids and liver.  Kirk Ruths, MD

## 2017-09-23 ENCOUNTER — Ambulatory Visit
Admission: RE | Admit: 2017-09-23 | Discharge: 2017-09-23 | Disposition: A | Discharge status: Home / Self Care | Payer: Medicare Other | Source: Ambulatory Visit | Attending: Radiation Oncology | Admitting: Radiation Oncology

## 2017-09-23 ENCOUNTER — Encounter: Payer: Self-pay | Admitting: Medical Oncology

## 2017-09-23 DIAGNOSIS — Z510 Encounter for antineoplastic radiation therapy: Secondary | ICD-10-CM | POA: Diagnosis not present

## 2017-09-23 DIAGNOSIS — C61 Malignant neoplasm of prostate: Secondary | ICD-10-CM | POA: Diagnosis not present

## 2017-09-24 ENCOUNTER — Ambulatory Visit (INDEPENDENT_AMBULATORY_CARE_PROVIDER_SITE_OTHER): Payer: Medicare Other | Admitting: Cardiology

## 2017-09-24 ENCOUNTER — Encounter

## 2017-09-24 ENCOUNTER — Encounter: Payer: Self-pay | Admitting: Cardiology

## 2017-09-24 VITALS — BP 96/66 | HR 78 | Ht 66.0 in | Wt 205.0 lb

## 2017-09-24 DIAGNOSIS — I2510 Atherosclerotic heart disease of native coronary artery without angina pectoris: Secondary | ICD-10-CM

## 2017-09-24 DIAGNOSIS — E7800 Pure hypercholesterolemia, unspecified: Secondary | ICD-10-CM

## 2017-09-24 DIAGNOSIS — I255 Ischemic cardiomyopathy: Secondary | ICD-10-CM

## 2017-09-24 DIAGNOSIS — Z79899 Other long term (current) drug therapy: Secondary | ICD-10-CM | POA: Diagnosis not present

## 2017-09-24 DIAGNOSIS — I10 Essential (primary) hypertension: Secondary | ICD-10-CM | POA: Diagnosis not present

## 2017-09-24 MED ORDER — ASPIRIN EC 81 MG PO TBEC: 81.0000 mg | DELAYED_RELEASE_TABLET | Freq: Every day | ORAL | Status: AC

## 2017-09-24 NOTE — Patient Instructions (Signed)
Medication Instructions: Your Physician recommend you make the following changes to your medication. Restart: Aspirin 81 mg daily   If you need a refill on your cardiac medications before your next appointment, please call your pharmacy.   Labwork: Your physician recommends that you return for lab work prior to next appointment (Lipid, LFT   Procedures/Testing: Your physician has requested that you have an echocardiogram. Echocardiography is a painless test that uses sound waves to create images of your heart. It provides your doctor with information about the size and shape of your heart and how well your heart's chambers and valves are working. This procedure takes approximately one hour. There are no restrictions for this procedure. [place]: Your physician wants you to follow-up in 6 months with Dr. Stanford Breed.  You will receive a reminder letter in the mail two months in advance. If you don't receive a letter, please call our office at [phone] to schedule this follow-up appointment.   Special Instructions:    Thank you for choosing [facility]!!

## 2017-09-24 NOTE — Addendum Note (Signed)
Addended by: Meryl Crutch on: 09/24/2017 03:42 PM   Modules accepted: Orders

## 2017-10-02 ENCOUNTER — Encounter: Payer: Self-pay | Admitting: Radiation Oncology

## 2017-10-02 NOTE — Progress Notes (Signed)
°  Radiation Oncology         [phone] ________________________________  Name: [name] MRN: [id number]  Date: 10/02/2017  DOB: Apr 04, 1945  End of Treatment Note  Diagnosis:   72 y.o. gentleman with locally advanced adenocarcinoma of the prostate with a Gleason's score of 5+5 and a PSA of 17.5     Indication for treatment:  Curative, Definitive Radiotherapy       Radiation treatment dates:   07/28/17 - 09/23/17  Site/dose:   The prostate was treated to 45 Gy in 25 fractions of 1.8 Gy, followed by a boost to the prostate to a total dose of 75 Gy with 15 additional fractions of 2.0 Gy.  Beams/energy:   The patient was treated with IMRT using volumetric arc therapy delivering 6 MV X-rays to clockwise and counterclockwise circumferential arcs with a 90 degree collimator offset to avoid dose scalloping.  Image guidance was performed with daily cone beam CT prior to each fraction to align to gold markers in the prostate and assure proper bladder and rectal fill volumes.  Immobilization was achieved with [employer] custom mold.  Narrative: The patient tolerated radiation treatment relatively well.  He denied fatigue, urgency, leakage or incontinence, hesitancy, diarrhea, and weak stream throughout most of his treatment. He experienced a UTI at the onset of treatment, which caused increased symptoms, including dysuria and weak stream, but was resolved by Cipro. He denied pain, hematuria, and difficulty emptying his bladder throughout treatment. He reported increasing nocturia from x6 to every 45 minutes. By the end of treatment, he reported soft bowel movements once per day.  Plan: The patient has completed radiation treatment. He will return to radiation oncology clinic for routine followup in one month. I advised him to call or return sooner if he has any questions or concerns related to his recovery or treatment. ________________________________  Sheral Apley. Tammi Klippel, M.D.  This document serves  as a record of services personally performed by Tyler Pita, MD. It was created on his behalf by Wilburn Mylar, a trained medical scribe. The creation of this record is based on the scribe's personal observations and the provider's statements to them. This document has been checked and approved by the attending provider.

## 2017-10-05 ENCOUNTER — Emergency Department (HOSPITAL_COMMUNITY)
Admission: EM | Admit: 2017-10-05 | Discharge: 2017-10-06 | Disposition: A | Discharge status: Home / Self Care | Payer: Medicare Other | Attending: Emergency Medicine | Admitting: Emergency Medicine

## 2017-10-05 ENCOUNTER — Emergency Department (HOSPITAL_COMMUNITY): Payer: Medicare Other

## 2017-10-05 ENCOUNTER — Encounter (HOSPITAL_COMMUNITY): Payer: Self-pay | Admitting: Emergency Medicine

## 2017-10-05 DIAGNOSIS — Z87891 Personal history of nicotine dependence: Secondary | ICD-10-CM | POA: Insufficient documentation

## 2017-10-05 DIAGNOSIS — I509 Heart failure, unspecified: Secondary | ICD-10-CM | POA: Diagnosis not present

## 2017-10-05 DIAGNOSIS — R109 Unspecified abdominal pain: Secondary | ICD-10-CM

## 2017-10-05 DIAGNOSIS — Z951 Presence of aortocoronary bypass graft: Secondary | ICD-10-CM | POA: Diagnosis not present

## 2017-10-05 DIAGNOSIS — R140 Abdominal distension (gaseous): Secondary | ICD-10-CM | POA: Diagnosis not present

## 2017-10-05 DIAGNOSIS — R1012 Left upper quadrant pain: Secondary | ICD-10-CM | POA: Insufficient documentation

## 2017-10-05 DIAGNOSIS — I2510 Atherosclerotic heart disease of native coronary artery without angina pectoris: Secondary | ICD-10-CM | POA: Diagnosis not present

## 2017-10-05 DIAGNOSIS — I130 Hypertensive heart and chronic kidney disease with heart failure and stage 1 through stage 4 chronic kidney disease, or unspecified chronic kidney disease: Secondary | ICD-10-CM | POA: Insufficient documentation

## 2017-10-05 DIAGNOSIS — Z8546 Personal history of malignant neoplasm of prostate: Secondary | ICD-10-CM | POA: Diagnosis not present

## 2017-10-05 DIAGNOSIS — N281 Cyst of kidney, acquired: Secondary | ICD-10-CM | POA: Diagnosis not present

## 2017-10-05 DIAGNOSIS — N189 Chronic kidney disease, unspecified: Secondary | ICD-10-CM | POA: Insufficient documentation

## 2017-10-05 DIAGNOSIS — Z8507 Personal history of malignant neoplasm of pancreas: Secondary | ICD-10-CM | POA: Insufficient documentation

## 2017-10-05 LAB — CBC
HCT: 28.9 % — ABNORMAL LOW (ref 39.0–52.0)
HEMOGLOBIN: 9.6 g/dL — AB (ref 13.0–17.0)
MCH: 28.4 pg (ref 26.0–34.0)
MCHC: 33.2 g/dL (ref 30.0–36.0)
MCV: 85.5 fL (ref 78.0–100.0)
Platelets: 321 10*3/uL (ref 150–400)
RBC: 3.38 MIL/uL — AB (ref 4.22–5.81)
RDW: 16 % — ABNORMAL HIGH (ref 11.5–15.5)
WBC: 4 10*3/uL (ref 4.0–10.5)

## 2017-10-05 LAB — COMPREHENSIVE METABOLIC PANEL
ALK PHOS: 86 U/L (ref 38–126)
ALT: 9 U/L (ref 0–44)
ANION GAP: 8 (ref 5–15)
AST: 11 U/L — ABNORMAL LOW (ref 15–41)
Albumin: 3.4 g/dL — ABNORMAL LOW (ref 3.5–5.0)
BILIRUBIN TOTAL: 0.3 mg/dL (ref 0.3–1.2)
BUN: 52 mg/dL — ABNORMAL HIGH (ref 8–23)
CALCIUM: 9.9 mg/dL (ref 8.9–10.3)
CO2: 17 mmol/L — ABNORMAL LOW (ref 22–32)
Chloride: 111 mmol/L (ref 98–111)
Creatinine, Ser: 3.38 mg/dL — ABNORMAL HIGH (ref 0.61–1.24)
GFR calc non Af Amer: 17 mL/min — ABNORMAL LOW (ref >60)
GFR, EST AFRICAN AMERICAN: 19 mL/min — AB (ref >60)
Glucose, Bld: 121 mg/dL — ABNORMAL HIGH (ref 70–99)
Potassium: 4.8 mmol/L (ref 3.5–5.1)
SODIUM: 136 mmol/L (ref 135–145)
TOTAL PROTEIN: 8.4 g/dL — AB (ref 6.5–8.1)

## 2017-10-05 LAB — LIPASE, BLOOD: Lipase: 24 U/L (ref 11–51)

## 2017-10-05 MED ORDER — IOHEXOL 300 MG/ML  SOLN
30.0000 mL | Freq: Once | INTRAMUSCULAR | Status: AC | PRN
  Administered 2017-10-05: 30 mL via ORAL

## 2017-10-05 MED ORDER — OXYCODONE HCL 5 MG PO TABS
5.0000 mg | ORAL_TABLET | Freq: Once | ORAL | Status: AC
  Administered 2017-10-05: 5 mg via ORAL
  Filled 2017-10-05: qty 1

## 2017-10-05 NOTE — ED Triage Notes (Signed)
Per pt, states abdominal pain and distention for a couple of days-pain radiating to left back-no dysuria, no N/V

## 2017-10-05 NOTE — ED Provider Notes (Signed)
[facility] Emergency Department Provider Note MRN:  [id number]  Arrival date & time: 10/06/17     Chief Complaint   Abdominal Pain and abdominal distention   History of Present Illness   [name] is a 72 y.o. year-old male with a history of MI, CKD, pancreatic cancer presenting to the ED with chief complaint of abdominal pain.  The pain is located in the abdomen diffusely but is worst in the left upper quadrant.  The pain began gradually yesterday, progressively worsening.  Associated with abdominal bloating and distention.  The pain is 8 out of 10 currently.  Patient denies any other associated symptoms, no fever, no chest pain or shortness of breath, no nausea or vomiting or diarrhea, no dysuria.  No exacerbating or alleviating factors.  Review of Systems  A complete 10 system review of systems was obtained and all systems are negative except as noted in the HPI and PMH.   Patient's Health History    Past Medical History:  Diagnosis Date  . Acute MI, inferior wall ([place]) 2011  . Amputation finger    right first finger top portion age 78  . Aortic atherosclerosis ([place])   . ARF (acute renal failure) ([place]) 01/24/2015  . CHF (congestive heart failure) ([place])   . CKD (chronic kidney disease), stage III ([place]) 11/2015  . Coronary artery disease cardioloigst-  dr Stanford Breed   hx inferior MI 10-08-2009 emergency CABG x4  . DOE (dyspnea on exertion)    since starting chemo  . DVT (deep venous thrombosis) ([place]) 11/05/2016   right proximal-mid peroneal veins  . Fracture of left hip ([place])   . H/O hyperkalemia   . History of acute inferior wall myocardial infarction 10/08/2009  . History of blood transfusion   . History of chemotherapy   . History of echocardiogram 03/2012   EF 45%, LAE, Mild MR, no residual VSD  . History of first degree heart block   . History of metabolic acidosis   . History of thrombocytopenia   . History of urethral stricture   . History  of UTI   . Hyperlipidemia   . Hypertension   . Ischemic cardiomyopathy   . Kidney cysts    bilateral  . Multiple pulmonary nodules    Bilateral scattered  . Multiple thyroid nodules   . Obstructed, uropathy   . Obstructive uropathy   . Pancreatic carcinoma (HCC)    s/p SBRT  . Pneumonia    Childhood  . Prostate cancer ([place])   . S/P VSD repair    09/2009  . Systolic CHF, chronic ([place])   . VSD (ventricular septal defect) 09/2009   acute after MI    Past Surgical History:  Procedure Laterality Date  . CARDIAC CATHETERIZATION    . CORONARY ARTERY BYPASS GRAFT     times 4  . CYSTOSCOPY W/ RETROGRADES N/A 04/03/2015   Procedure:  BLADDER BIOPSIES;  Surgeon: Festus Aloe, MD;  [facility];  Service: Urology;  Laterality: N/A;  . CYSTOSCOPY W/ RETROGRADES Bilateral 06/27/2016   Procedure: CYSTOSCOPY WITH BILATERAL RETROGRADES;  Surgeon: Festus Aloe, MD;  [facility];  Service: Urology;  Laterality: Bilateral;  . CYSTOSCOPY WITH BIOPSY N/A 06/27/2016   Procedure: CYSTOSCOPY WITH BLADDER BIOPSY URETHRAL BIOPSY;  Surgeon: Festus Aloe, MD;  [facility];  Service: Urology;  Laterality: N/A;  . CYSTOSCOPY WITH URETHRAL DILATATION N/A 04/03/2015   Procedure: CYSTOSCOPY WITH BILATERAL RETROGRADES;  Surgeon: Festus Aloe, MD;  Location: [facility];  Service: Urology;  Laterality: N/A;  . EUS N/A 08/28/2016   Procedure: UPPER ENDOSCOPIC ULTRASOUND (EUS) RADIAL;  Surgeon: Milus Banister, MD;  [facility];  Service: Endoscopy;  Laterality: N/A;  . flexible cystoscopy    . GOLD SEED IMPLANT N/A 07/03/2017   Procedure: GOLD SEED IMPLANT;  Surgeon: Festus Aloe, MD;  Location: [facility];  Service: Urology;  Laterality: N/A;  Needs Ultrasound Tech  . repair of post infarction posterior ventricular septal defect  09/2009  . SPACE OAR INSTILLATION N/A 07/03/2017   Procedure: SPACE OAR INSTILLATION;  Surgeon: Festus Aloe, MD;  Location:  [facility];  Service: Urology;  Laterality: N/A;  . TRANSURETHRAL RESECTION OF PROSTATE  06/27/2016   Procedure: TRANSURETHRAL RESECTION OF THE PROSTATE (TURP);  Surgeon: Festus Aloe, MD;  [facility];  Service: Urology;;  . UPPER GI ENDOSCOPY      Family History  Problem Relation Age of Onset  . Cancer Paternal Aunt        unknown type cancer   . Cancer Paternal Uncle        unknown type cancer    Social History   Socioeconomic History  . Marital status: Divorced    Spouse name: Not on file  . Number of children: Not on file  . Years of education: Not on file  . Highest education level: Not on file  Occupational History  . Not on file  Social Needs  . Financial resource strain: Not on file  . Food insecurity:    Worry: Not on file    Inability: Not on file  . Transportation needs:    Medical: Not on file    Non-medical: Not on file  Tobacco Use  . Smoking status: Former Smoker    Packs/day: 1.50    Years: 45.00    Pack years: 67.50    Types: Cigarettes    Last attempt to quit: 01/13/2010    Years since quitting: 7.7  . Smokeless tobacco: Never Used  Substance and Sexual Activity  . Alcohol use: No    Comment: occasional   . Drug use: No  . Sexual activity: Not on file  Lifestyle  . Physical activity:    Days per week: Not on file    Minutes per session: Not on file  . Stress: Not on file  Relationships  . Social connections:    Talks on phone: Not on file    Gets together: Not on file    Attends religious service: Not on file    Active member of club or organization: Not on file    Attends meetings of clubs or organizations: Not on file    Relationship status: Not on file  . Intimate partner violence:    Fear of current or ex partner: Not on file    Emotionally abused: Not on file    Physically abused: Not on file    Forced sexual activity: Not on file  Other Topics Concern  . Not on file  Social History Narrative  . Not on  file     Physical Exam  Vital Signs and Nursing Notes reviewed Vitals:   10/05/17 1905 10/05/17 2352  BP: (!) 158/89 (!) 145/96  Pulse: 61 60  Resp: 18 20  Temp:    SpO2: 100% 100%    CONSTITUTIONAL: Well-appearing, NAD NEURO:  Alert and oriented x 3, no focal deficits EYES:  eyes equal and reactive ENT/NECK:  no LAD,  no JVD CARDIO: Regular rate, well-perfused, normal S1 and S2 PULM:  CTAB no wheezing or rhonchi GI/GU:  normal bowel sounds, non-distended, moderate diffuse tenderness MSK/SPINE:  No gross deformities, no edema SKIN:  no rash, atraumatic PSYCH:  Appropriate speech and behavior  Diagnostic and Interventional Summary    EKG Interpretation  Date/Time:  Monday October 05 2017 23:51:09 EDT Ventricular Rate:  61 PR Interval:    QRS Duration: 84 QT Interval:  409 QTC Calculation: 412 R Axis:   35 Text Interpretation:  Sinus rhythm Prolonged PR interval Borderline T abnormalities, lateral leads Confirmed by Gerlene Fee [phone]) on 10/06/2017 12:40:26 AM      Labs Reviewed  COMPREHENSIVE METABOLIC PANEL - Abnormal; Notable for the following components:      Result Value   CO2 17 (*)    Glucose, Bld 121 (*)    BUN 52 (*)    Creatinine, Ser 3.38 (*)    Total Protein 8.4 (*)    Albumin 3.4 (*)    AST 11 (*)    GFR calc non Af Amer 17 (*)    GFR calc Af Amer 19 (*)    All other components within normal limits  CBC - Abnormal; Notable for the following components:   RBC 3.38 (*)    Hemoglobin 9.6 (*)    HCT 28.9 (*)    RDW 16.0 (*)    All other components within normal limits  LIPASE, BLOOD  URINALYSIS, ROUTINE W REFLEX MICROSCOPIC  I-STAT TROPONIN, ED    CT ABDOMEN PELVIS WO CONTRAST  Final Result      Medications  oxyCODONE (Oxy IR/ROXICODONE) immediate release tablet 5 mg (5 mg Oral Given 10/05/17 1927)  iohexol (OMNIPAQUE) 300 MG/ML solution 30 mL (30 mLs Oral Contrast Given 10/05/17 2003)     Procedures Critical Care  ED Course and  Medical Decision Making  I have reviewed the triage vital signs and the nursing notes.  Pertinent labs & imaging results that were available during my care of the patient were reviewed by me and considered in my medical decision making (see below for details).  Progressively worsening pain, moderate tenderness on exam in a 72 year old male.  CT pending to exclude splenic pathology, perforated viscus.  CT with no acute findings.  Question of calcified ventricular aneurysm, but no EKG changes, troponin negative.  Patient made aware of his minimally worsened kidney function, decreased hemoglobin.  Will follow closely with nephrologist and cardiologist.  Patient is feeling much better and requesting discharge.  After the discussed management above, the patient was determined to be safe for discharge.  The patient was in agreement with this plan and all questions regarding their care were answered.  ED return precautions were discussed and the patient will return to the ED with any significant worsening of condition.  Barth Kirks. Sedonia Small, MD [place] [email or web address]  Final Clinical Impressions(s) / ED Diagnoses     ICD-10-CM   1. Abdominal pain, unspecified abdominal location R10.9     ED Discharge Orders    None         Maudie Flakes, MD 10/06/17 [phone]

## 2017-10-05 NOTE — ED Notes (Signed)
Pt denies N/V/Fevers

## 2017-10-06 ENCOUNTER — Other Ambulatory Visit: Payer: Self-pay

## 2017-10-06 ENCOUNTER — Ambulatory Visit (HOSPITAL_BASED_OUTPATIENT_CLINIC_OR_DEPARTMENT_OTHER): Payer: Medicare Other

## 2017-10-06 DIAGNOSIS — R140 Abdominal distension (gaseous): Secondary | ICD-10-CM | POA: Diagnosis not present

## 2017-10-06 DIAGNOSIS — I255 Ischemic cardiomyopathy: Secondary | ICD-10-CM

## 2017-10-06 LAB — I-STAT TROPONIN, ED: TROPONIN I, POC: 0.02 ng/mL (ref 0.00–0.08)

## 2017-10-06 LAB — ECHOCARDIOGRAM COMPLETE

## 2017-10-06 MED ORDER — PERFLUTREN LIPID MICROSPHERE
1.0000 mL | INTRAVENOUS | Status: AC | PRN
  Administered 2017-10-06: 1.75 mL via INTRAVENOUS

## 2017-10-06 NOTE — Discharge Instructions (Addendum)
You were evaluated in the Emergency Department and after careful evaluation, we did not find any emergent condition requiring admission or further testing in the hospital.  It is important that you follow-up closely with your cardiologist and nephrologist.  Your kidney function is just a little bit worse than it has been in the past.  Please return to the Emergency Department if you experience any worsening of your condition.  We encourage you to follow up with a primary care provider.  Thank you for allowing [place] to be a part of your care.

## 2017-10-12 ENCOUNTER — Emergency Department (HOSPITAL_COMMUNITY)
Admission: EM | Admit: 2017-10-12 | Discharge: 2017-10-12 | Disposition: A | Discharge status: Home / Self Care | Payer: Medicare Other | Attending: Emergency Medicine | Admitting: Emergency Medicine

## 2017-10-12 ENCOUNTER — Other Ambulatory Visit: Payer: Self-pay

## 2017-10-12 ENCOUNTER — Encounter (HOSPITAL_COMMUNITY): Payer: Self-pay | Admitting: Emergency Medicine

## 2017-10-12 DIAGNOSIS — Z8546 Personal history of malignant neoplasm of prostate: Secondary | ICD-10-CM | POA: Insufficient documentation

## 2017-10-12 DIAGNOSIS — Z7982 Long term (current) use of aspirin: Secondary | ICD-10-CM | POA: Diagnosis not present

## 2017-10-12 DIAGNOSIS — R338 Other retention of urine: Secondary | ICD-10-CM | POA: Diagnosis not present

## 2017-10-12 DIAGNOSIS — Z79899 Other long term (current) drug therapy: Secondary | ICD-10-CM | POA: Diagnosis not present

## 2017-10-12 DIAGNOSIS — I130 Hypertensive heart and chronic kidney disease with heart failure and stage 1 through stage 4 chronic kidney disease, or unspecified chronic kidney disease: Secondary | ICD-10-CM | POA: Insufficient documentation

## 2017-10-12 DIAGNOSIS — N183 Chronic kidney disease, stage 3 (moderate): Secondary | ICD-10-CM | POA: Diagnosis not present

## 2017-10-12 DIAGNOSIS — Z87891 Personal history of nicotine dependence: Secondary | ICD-10-CM | POA: Insufficient documentation

## 2017-10-12 DIAGNOSIS — N390 Urinary tract infection, site not specified: Secondary | ICD-10-CM | POA: Diagnosis not present

## 2017-10-12 DIAGNOSIS — Z89021 Acquired absence of right finger(s): Secondary | ICD-10-CM | POA: Diagnosis not present

## 2017-10-12 DIAGNOSIS — I5022 Chronic systolic (congestive) heart failure: Secondary | ICD-10-CM | POA: Insufficient documentation

## 2017-10-12 LAB — URINALYSIS, ROUTINE W REFLEX MICROSCOPIC
BILIRUBIN URINE: NEGATIVE
Glucose, UA: NEGATIVE mg/dL
KETONES UR: NEGATIVE mg/dL
Nitrite: NEGATIVE
PH: 5 (ref 5.0–8.0)
Protein, ur: 100 mg/dL — AB
SPECIFIC GRAVITY, URINE: 1.008 (ref 1.005–1.030)

## 2017-10-12 MED ORDER — CEPHALEXIN 500 MG PO CAPS
1000.0000 mg | ORAL_CAPSULE | Freq: Once | ORAL | Status: AC
  Administered 2017-10-12: 1000 mg via ORAL
  Filled 2017-10-12: qty 2

## 2017-10-12 MED ORDER — CEPHALEXIN 500 MG PO CAPS: 500.0000 mg | ORAL_CAPSULE | Freq: Four times a day (QID) | ORAL | 0 refills | Status: DC

## 2017-10-12 MED ORDER — LIDOCAINE HCL URETHRAL/MUCOSAL 2 % EX GEL
1.0000 "application " | Freq: Once | CUTANEOUS | Status: AC | PRN
  Administered 2017-10-12: 1 via URETHRAL
  Filled 2017-10-12: qty 5

## 2017-10-12 NOTE — ED Triage Notes (Signed)
Pt reports not being able to urinate fully since yesterday. Pt reports dribbling when trying to urinate.

## 2017-10-12 NOTE — ED Notes (Signed)
Bed: [facility] Expected date:  Expected time:  Means of arrival:  Comments: 

## 2017-10-12 NOTE — ED Provider Notes (Signed)
[facility] DEPT Provider Note: Georgena Spurling, MD, FACEP  CSN: [id number] MRN: [id number] ARRIVAL: 10/12/17 at [place]: [place]  Urinary Retention   HISTORY OF PRESENT ILLNESS  10/12/17 4:28 AM [name] is a 72 y.o. male who has not been able to urinate properly since yesterday.  He has been attempting to urinate frequently and voiding only small amounts.  On arrival he reported bladder discomfort which he rated as an 8 out of 10.  Bedside bladder scan showed about 580 mL of residual urine in the bladder.  Foley catheter was placed by nursing staff relieving his discomfort.  He denies fever, chills, nausea or vomiting.   Past Medical History:  Diagnosis Date  . Acute MI, inferior wall ([place]) 2011  . Amputation finger    right first finger top portion age 4  . Aortic atherosclerosis ([place])   . ARF (acute renal failure) ([place]) 01/24/2015  . CHF (congestive heart failure) ([place])   . CKD (chronic kidney disease), stage III ([place]) 11/2015  . Coronary artery disease cardioloigst-  dr Stanford Breed   hx inferior MI 10-08-2009 emergency CABG x4  . DOE (dyspnea on exertion)    since starting chemo  . DVT (deep venous thrombosis) ([place]) 11/05/2016   right proximal-mid peroneal veins  . Fracture of left hip ([place])   . H/O hyperkalemia   . History of acute inferior wall myocardial infarction 10/08/2009  . History of blood transfusion   . History of chemotherapy   . History of echocardiogram 03/2012   EF 45%, LAE, Mild MR, no residual VSD  . History of first degree heart block   . History of metabolic acidosis   . History of thrombocytopenia   . History of urethral stricture   . History of UTI   . Hyperlipidemia   . Hypertension   . Ischemic cardiomyopathy   . Kidney cysts    bilateral  . Multiple pulmonary nodules    Bilateral scattered  . Multiple thyroid nodules   . Obstructed, uropathy   . Obstructive uropathy   . Pancreatic carcinoma (HCC)    s/p  SBRT  . Pneumonia    Childhood  . Prostate cancer ([place])   . S/P VSD repair    09/2009  . Systolic CHF, chronic ([place])   . VSD (ventricular septal defect) 09/2009   acute after MI    Past Surgical History:  Procedure Laterality Date  . CARDIAC CATHETERIZATION    . CORONARY ARTERY BYPASS GRAFT     times 4  . CYSTOSCOPY W/ RETROGRADES N/A 04/03/2015   Procedure:  BLADDER BIOPSIES;  Surgeon: Festus Aloe, MD;  [facility];  Service: Urology;  Laterality: N/A;  . CYSTOSCOPY W/ RETROGRADES Bilateral 06/27/2016   Procedure: CYSTOSCOPY WITH BILATERAL RETROGRADES;  Surgeon: Festus Aloe, MD;  [facility];  Service: Urology;  Laterality: Bilateral;  . CYSTOSCOPY WITH BIOPSY N/A 06/27/2016   Procedure: CYSTOSCOPY WITH BLADDER BIOPSY URETHRAL BIOPSY;  Surgeon: Festus Aloe, MD;  [facility];  Service: Urology;  Laterality: N/A;  . CYSTOSCOPY WITH URETHRAL DILATATION N/A 04/03/2015   Procedure: CYSTOSCOPY WITH BILATERAL RETROGRADES;  Surgeon: Festus Aloe, MD;  [facility];  Service: Urology;  Laterality: N/A;  . EUS N/A 08/28/2016   Procedure: UPPER ENDOSCOPIC ULTRASOUND (EUS) RADIAL;  Surgeon: Milus Banister, MD;  [facility];  Service: Endoscopy;  Laterality: N/A;  . flexible cystoscopy    . GOLD SEED IMPLANT N/A 07/03/2017  Procedure: GOLD SEED IMPLANT;  Surgeon: Festus Aloe, MD;  Location: [facility];  Service: Urology;  Laterality: N/A;  Needs Ultrasound Tech  . repair of post infarction posterior ventricular septal defect  09/2009  . SPACE OAR INSTILLATION N/A 07/03/2017   Procedure: SPACE OAR INSTILLATION;  Surgeon: Festus Aloe, MD;  Location: [facility];  Service: Urology;  Laterality: N/A;  . TRANSURETHRAL RESECTION OF PROSTATE  06/27/2016   Procedure: TRANSURETHRAL RESECTION OF THE PROSTATE (TURP);  Surgeon: Festus Aloe, MD;  [facility];  Service: Urology;;  . UPPER GI ENDOSCOPY       Family History  Problem Relation Age of Onset  . Cancer Paternal Aunt        unknown type cancer   . Cancer Paternal Uncle        unknown type cancer    Social History   Tobacco Use  . Smoking status: Former Smoker    Packs/day: 1.50    Years: 45.00    Pack years: 67.50    Types: Cigarettes    Last attempt to quit: 01/13/2010    Years since quitting: 7.7  . Smokeless tobacco: Never Used  Substance Use Topics  . Alcohol use: No    Comment: occasional   . Drug use: No    Prior to Admission medications   Medication Sig Start Date End Date Taking? Authorizing Provider  amLODipine (NORVASC) 10 MG tablet take 1/2 tablet by mouth once daily Patient taking differently: Take 5 mg by mouth daily.  03/05/17  Yes Lelon Perla, MD  carvedilol (COREG) 12.5 MG tablet take 1 tablet by mouth twice a day with meals **KEEP OFFICE VISIT** Patient taking differently: Take 12.5 mg by mouth 2 (two) times daily with a meal.  11/10/16  Yes Crenshaw, Denice Bors, MD  rosuvastatin (CRESTOR) 40 MG tablet take 1 tablet by mouth once daily **KEEP OFFICE VISIT** Patient taking differently: Take 40 mg by mouth daily.  11/10/16  Yes Lelon Perla, MD  aspirin EC 81 MG tablet Take 1 tablet (81 mg total) by mouth daily. 09/24/17   Lelon Perla, MD    Allergies Chlorhexidine gluconate   REVIEW OF SYSTEMS  Negative except as noted here or in the History of Present Illness.   PHYSICAL EXAMINATION  Initial Vital Signs Blood pressure 138/86, pulse 85, temperature 98.4 F (36.9 C), temperature source Oral, resp. rate 15, height 5\' 8"  (1.727 m), weight 91.2 kg, SpO2 99 %.  Examination General: Well-developed, well-nourished male in no acute distress; appearance consistent with age of record HENT: normocephalic; atraumatic Eyes: pupils equal, round and reactive to light; extraocular muscles intact Neck: supple Heart: regular rate and rhythm Lungs: clear to auscultation bilaterally Abdomen:  soft; nondistended; mild bladder tenderness (bladder still emptying after Foley placement); bowel sounds present Extremities: No deformity; full range of motion; pulses normal Neurologic: Awake, alert and oriented; motor function intact in all extremities and symmetric; no facial droop Skin: Warm and dry Psychiatric: Flat affect   RESULTS  Summary of this visit's results, reviewed by myself:   EKG Interpretation  Date/Time:    Ventricular Rate:    PR Interval:    QRS Duration:   QT Interval:    QTC Calculation:   R Axis:     Text Interpretation:        Laboratory Studies: Results for orders placed or performed during the hospital encounter of 10/12/17 (from the past 24 hour(s))  Urinalysis, Routine w reflex  microscopic- may I&O cath if menses     Status: Abnormal   Collection Time: 10/12/17  4:16 AM  Result Value Ref Range   Color, Urine YELLOW YELLOW   APPearance HAZY (A) CLEAR   Specific Gravity, Urine 1.008 1.005 - 1.030   pH 5.0 5.0 - 8.0   Glucose, UA NEGATIVE NEGATIVE mg/dL   Hgb urine dipstick MODERATE (A) NEGATIVE   Bilirubin Urine NEGATIVE NEGATIVE   Ketones, ur NEGATIVE NEGATIVE mg/dL   Protein, ur 100 (A) NEGATIVE mg/dL   Nitrite NEGATIVE NEGATIVE   Leukocytes, UA LARGE (A) NEGATIVE   RBC / HPF 0-5 0 - 5 RBC/hpf   WBC, UA >50 (H) 0 - 5 WBC/hpf   Bacteria, UA RARE (A) NONE SEEN   Imaging Studies: No results found.  ED COURSE and MDM  Nursing notes and initial vitals signs, including pulse oximetry, reviewed.  Vitals:   10/12/17 0412 10/12/17 0413  BP: 138/86   Pulse: 85   Resp: 15   Temp: 98.4 F (36.9 C)   TempSrc: Oral   SpO2: 99%   Weight:  91.2 kg  Height:  5\' 8"  (1.727 m)   Urinalysis consistent with urinary tract infection.  We will treat with Keflex and discharge patient with Foley catheter leg bag.  He has a urologist with whom he can follow-up, Dr. Junious Silk.  PROCEDURES    ED DIAGNOSES     ICD-10-CM   1. Acute urinary retention  R33.8   2. Lower urinary tract infectious disease N39.0        Lesslie Mckeehan, MD 10/12/17 [id number]

## 2017-10-13 LAB — URINE CULTURE: Culture: NO GROWTH

## 2017-10-23 ENCOUNTER — Ambulatory Visit
Admission: RE | Admit: 2017-10-23 | Discharge: 2017-10-23 | Disposition: A | Discharge status: Home / Self Care | Payer: Medicare Other | Source: Ambulatory Visit | Attending: Urology | Admitting: Urology

## 2017-10-23 ENCOUNTER — Encounter: Payer: Self-pay | Admitting: Urology

## 2017-10-23 ENCOUNTER — Other Ambulatory Visit: Payer: Self-pay

## 2017-10-23 VITALS — BP 126/75 | HR 69 | Temp 98.4°F | Resp 20 | Ht 68.0 in | Wt 202.6 lb

## 2017-10-23 DIAGNOSIS — Z923 Personal history of irradiation: Secondary | ICD-10-CM | POA: Diagnosis not present

## 2017-10-23 DIAGNOSIS — C61 Malignant neoplasm of prostate: Secondary | ICD-10-CM | POA: Insufficient documentation

## 2017-10-23 DIAGNOSIS — R338 Other retention of urine: Secondary | ICD-10-CM | POA: Diagnosis not present

## 2017-10-23 DIAGNOSIS — C259 Malignant neoplasm of pancreas, unspecified: Secondary | ICD-10-CM | POA: Diagnosis not present

## 2017-10-23 DIAGNOSIS — Z7982 Long term (current) use of aspirin: Secondary | ICD-10-CM | POA: Diagnosis not present

## 2017-10-23 DIAGNOSIS — Z79899 Other long term (current) drug therapy: Secondary | ICD-10-CM | POA: Diagnosis not present

## 2017-10-23 MED ORDER — TAMSULOSIN HCL 0.4 MG PO CAPS
0.4000 mg | ORAL_CAPSULE | Freq: Every day | ORAL | 5 refills | Status: DC
Start: 2017-10-23 — End: 2018-01-01

## 2017-10-23 NOTE — Progress Notes (Signed)
Radiation Oncology         [phone] ________________________________  Name: [name] MRN: [id number]  Date: 10/23/2017  DOB: 27-Sep-1945  Post Treatment Note  CC: Patient, No Pcp Per  Truitt Merle, MD  Diagnosis:   72 y.o. gentleman with locally advanced adenocarcinoma of the prostate with a Gleason's score of 5+5 and a PSA of 17.5   Interval Since Last Radiation:  4 weeks  07/28/17 - 09/23/17:  The prostate was treated to 45 Gy in 25 fractions of 1.8 Gy, followed by a boost to the prostate to a total dose of 75 Gy with 15 additional fractions of 2.0 Gy. (Dr. Tammi Klippel)  12/30/2016, 01/02/2017, 01/05/2017, 01/07/2017, 01/09/2017:   The pancreas was treated to 33 Gy in 5 fractions of 6.6 Gy using the SBRT/SRT-3D technique. (Dr. Lisbeth Renshaw)  Narrative:  The patient returns today for routine follow-up. He tolerated radiation treatment relatively well.  He denied fatigue, urgency, leakage or incontinence, hesitancy, diarrhea, and weak stream throughout most of his treatment. He experienced a UTI at the onset of treatment, which caused increased symptoms, including dysuria and weak stream, but was resolved by Cipro. He denied pain, hematuria, and difficulty emptying his bladder throughout treatment. He reported increasing nocturia from x6 to every 45 minutes. By the end of treatment, he reported soft bowel movements once per day.  He was maintained on ADT throughout his course of radiotherapy.        In summary, he is well established with [facility] due to BPH with urinary retention, previously followed by Dr. Janice Norrie and currently managed by Dr. Junious Silk.  He had a recurrent episode of AUR in 06/2016 and DRE at that time revealed some induration.  The patient proceeded with TURP with prostate biopsy on 06/27/2016.  Pathology returned Gleason 5+5 disease in all TUR chips with local invasion into the bladder detrusor muscle. PSA on 07/11/16 was 17.5.  He had CT and Bone scan on 07/08/16.  Bone scan was  negative for any osseous metastatic disease.  CT scan showed a single pathologically enlarged pelvic lymph node as well as dilation of the main pancreatic duct without discrete pancreatic mass. He was seen in prostate [facility] 07/25/16 and was planning to proceed with 8 weeks of radiotherapy following ADT administration. He started ADT in the summer and due to the findings on the CT within the pancreas, he was counseled on holding off on radiation until his pancreas issue had been clarified. Repeat PSA 11/18/2016 was 0.95 on ADT.   AnMRI abdomen on 7/16/18revealeda 2.8 x 2 cm mass within the head of the pancreas, obstructing pancreatic duct measuring 1.2 cm in diameter, the portal vein and portal venous confluence appeared partially narrowed by the lesion as well. No pathologically enlarged lymph nodes were identified. He met with GI and underwent EUS on 08/28/16 with a biopsy of his pancreatic headwhich confirmed T2N0M0 adenocarcinoma of the pancreas.  He was considered borderline resectable, and proceeded with systemic therapy under the care of Dr. Burr Medico with neoadjuvant Gemcitabine on day 1 and 8 every 3 weeks started 09/26/16, added Abraxane on cycle 2 day 1 but chemo was stopped after 2 cycles due to poor tolerance.He hadrepeat imaging of the abdomen with MRI 11/25/16 which confirmed persistent 2.5 cm mass in the head of the pancreas, no peripancreatic inflammatory changes or fluid was noted, and no additional adenopathy was present. He was not felt to be an ideal surgical candidate and therefore elected to proceed with SBRT  to the pancreatic mass over a course of 5 fractions in 12/2016.  SBRT was completed 01/09/17 and he tolerated treatment well.  Restaging CT abdomen on 02/20/17 showed disease stability with no evidence of metastasis on the non-contrast CT. He has continued in follow up under the care and direction of Dr. Burr Medico regarding his pancreatic carcinoma with his last visit 03/2017.  He had a recent CT  A/P at the time of his recent ER visit on 10/12/17 which showed, given lack of IV contrast, the reported pancreatic head mass is difficult to visualize but no significant progression is demonstrated. Probable mild ectasia of the pancreatic duct accounting for subtle areas of hypodensity within the atrophic body and tail.  Most recent PSA prior to starting XRT was 0.57 on 04/17/17 (on ADT since 08/12/16).                     On review of systems, the patient states that he is doing well overall.  Unfortunately, approximately 2 weeks out from completing radiotherapy to the prostate, the patient developed acute urinary retention requiring Foley catheter placement in the emergency department on 10/12/2017.  He was not started on Flomax.  Urine culture was performed at that time to rule out infection and was negative.  He reports that the Foley catheter has continued to drain well and he has not noted any gross hematuria.  He denies recent fevers, chills, abdominal pain, nausea, vomiting, diarrhea or constipation.  He reports a healthy appetite and is maintaining his weight.  He denies any significant fatigue and reports continuing to tolerate ADT fairly well overall.  He has a scheduled visit at [facility] urology for voiding trial on 10/29/2017.  ALLERGIES:  is allergic to chlorhexidine gluconate.  Meds: Current Outpatient Medications  Medication Sig Dispense Refill  . amLODipine (NORVASC) 10 MG tablet take 1/2 tablet by mouth once daily (Patient taking differently: Take 5 mg by mouth daily. ) 30 tablet 11  . aspirin EC 81 MG tablet Take 1 tablet (81 mg total) by mouth daily.    . carvedilol (COREG) 12.5 MG tablet take 1 tablet by mouth twice a day with meals **KEEP OFFICE VISIT** (Patient taking differently: Take 12.5 mg by mouth 2 (two) times daily with a meal. ) 30 tablet 10  . cephALEXin (KEFLEX) 500 MG capsule Take 1 capsule (500 mg total) by mouth 4 (four) times daily. 28 capsule 0  . rosuvastatin  (CRESTOR) 40 MG tablet take 1 tablet by mouth once daily **KEEP OFFICE VISIT** (Patient taking differently: Take 40 mg by mouth daily. ) 30 tablet 10  . tamsulosin (FLOMAX) 0.4 MG CAPS capsule Take 1 capsule (0.4 mg total) by mouth daily after supper. 30 capsule 5   No current facility-administered medications for this encounter.     Physical Findings:  height is '5\' 8"'$  (1.727 m) and weight is 202 lb 9.6 oz (91.9 kg). His oral temperature is 98.4 F (36.9 C). His blood pressure is 126/75 and his pulse is 69. His respiration is 20 and oxygen saturation is 100%.  Pain Assessment Pain Score: 0-No pain/10 In general this is a well appearing African-American male in no acute distress.  He's alert and oriented x4 and appropriate throughout the examination. Cardiopulmonary assessment is negative for acute distress and he exhibits normal effort.   Lab Findings: Lab Results  Component Value Date   WBC 4.0 10/05/2017   HGB 9.6 (L) 10/05/2017   HCT 28.9 (L) 10/05/2017  MCV 85.5 10/05/2017   PLT 321 10/05/2017     Radiographic Findings: Ct Abdomen Pelvis Wo Contrast  Result Date: 10/05/2017 CLINICAL DATA:  Abdominal pain and distention for several days. History of pancreatic cancer status post SBRT. EXAM: CT ABDOMEN AND PELVIS WITHOUT CONTRAST TECHNIQUE: Multidetector CT imaging of the abdomen and pelvis was performed following the standard protocol without IV contrast. COMPARISON:  None. FINDINGS: Lower chest: Calcification along the inferior aspect of the left ventricle compatible with chronic left ventricular aneurysm. Heart size is enlarged but stable. Scattered left main and three-vessel coronary arteriosclerosis. Median sternotomy sutures are place. Hepatobiliary: The unenhanced liver is unremarkable. Layering biliary sludge noted within the gallbladder. Pancreas: Surgical clips project about the pancreatic head as before. Pancreas is atrophic in appearance with probable ectasia of the  pancreatic duct. No calculus noted. No significant progression in size of pancreatic head mass though assessment is limited due to lack of IV contrast. Spleen: Normal Adrenals/Urinary Tract: Small simple and complex renal lesions are identified some the complex lesions hyperdense in appearance similar to prior compatible with hemorrhagic proteinaceous cysts. Largest cyst is in the interpolar aspect of the right kidney within the cortex measuring approximately 1.7 cm. No nephrolithiasis nor hydroureteronephrosis. Chronic thick-walled appearance of both renal collecting systems and urinary bladder query stigmata of chronic urinary tract infection. Stomach/Bowel: Small hiatal hernia. Contrast distended stomach with normal antegrade flow of contrast into the small intestine. No obstruction to the antegrade flow of contrast into the colon is identified. The appendix is contrast filled and normal. Moderate amount of stool is seen within the colon to the level of the rectum. No large bowel obstruction or inflammation. Vascular/Lymphatic: Moderate aortoiliac atherosclerosis. No lymphadenopathy. Reproductive: Normal size prostate surgical clips noted within. Other: Small amount free fluid in the pelvis. Musculoskeletal: Lipomas of the external oblique muscles bilaterally. No acute nor aggressive osseous lesions. Short pedicles of the lumbar spine as before. IMPRESSION: 1. Calcification along the inferior wall of the left ventricle redemonstrated possibly associated with a calcified left ventricular aneurysm. 2. Given lack of IV contrast, the reported pancreatic head mass is difficult to visualize but no significant progression is demonstrated. Probable mild ectasia of the pancreatic duct accounting for subtle areas of hypodensity within the atrophic body and tail. 3. Fiduciary clips are identified in the region of the pancreatic head as well as in the prostate. 4. Chronic urothelial thickening of both renal collecting  systems and urinary bladder suspicious for changes of chronic urinary tract infection. 5. Redemonstration of bilateral renal cysts some which are simple and others more complex and hyperdense as before. No significant progression identified. 6. No acute bowel obstruction or inflammation. Normal appendix. Moderate stool retention within the colon query constipation. 7. Other ancillary findings as above. Electronically Signed   By: Ashley Royalty M.D.   On: 10/05/2017 22:47    Impression/Plan: 1. 72 y.o. gentleman with locally advanced adenocarcinoma of the prostate with a Gleason's score of 5+5 and a PSA of 17.5.   He will continue to follow up with urology for ongoing PSA determinations and has an appointment scheduled with Dr. Junious Silk on 11/03/17. He understands what to expect with regards to PSA monitoring going forward. I will look forward to following his response to treatment via correspondence with urology, and would be happy to continue to participate in his care if clinically indicated. I talked to the patient about what to expect in the future, including his risk for erectile dysfunction and rectal bleeding.  I encouraged him to call or return to the office if he has any questions regarding his previous radiation or possible radiation side effects. He was comfortable with this plan and will follow up as needed.   2. Acute urinary retention.  We discussed that this is likely secondary to radiation induced inflammation/prostatitis and will resolve with time.  He has a planned follow-up at [facility] urology on 10/29/2017 for voiding trial.  I am going to go ahead and start him on Flomax 0.4 mg p.o. nightly in hopes that this will increase our probability of success at the time of voiding trial.  I advised that he will likely need to stay on the Flomax for 6 to 8 weeks to allow the inflammation to fully subside but this will be at the discretion of Dr. Junious Silk.  3. T2N0M0 adenocarcinoma of the pancreas.   He appears to have recovered well from the effects of SBRT to the pancreas completed in December 2018.  Recent CT A/P from 10/12/17 failed to demonstrate any evidence of disease recurrence or progression but was limited due to noncontrast exam.  The recommendation is to continue in routine follow-up with Dr. Burr Medico for continued disease management and surveillance.  He has not been seen since March of 2019 so I have advised him to call their office to schedule follow-up in the near future.  He is in agreement with and is comfortable with this plan.     Nicholos Johns, PA-C

## 2017-11-04 DIAGNOSIS — C61 Malignant neoplasm of prostate: Secondary | ICD-10-CM | POA: Diagnosis not present

## 2017-11-04 DIAGNOSIS — R338 Other retention of urine: Secondary | ICD-10-CM | POA: Diagnosis not present

## 2017-11-13 ENCOUNTER — Other Ambulatory Visit: Payer: Self-pay | Admitting: Cardiology

## 2017-12-22 ENCOUNTER — Telehealth: Payer: Self-pay

## 2017-12-22 NOTE — Telephone Encounter (Signed)
Per patient requested to schedule a appointment due to completion of radiation. Per 12/10 los

## 2017-12-27 ENCOUNTER — Inpatient Hospital Stay (HOSPITAL_COMMUNITY)
Admission: EM | Admit: 2017-12-27 | Discharge: 2018-01-01 | DRG: 445 | Disposition: A | Discharge status: Home Health | Payer: Medicare Other | Attending: Family Medicine | Admitting: Family Medicine

## 2017-12-27 ENCOUNTER — Emergency Department (HOSPITAL_COMMUNITY): Payer: Medicare Other

## 2017-12-27 ENCOUNTER — Encounter (HOSPITAL_COMMUNITY): Payer: Self-pay

## 2017-12-27 ENCOUNTER — Other Ambulatory Visit: Payer: Self-pay

## 2017-12-27 DIAGNOSIS — Z888 Allergy status to other drugs, medicaments and biological substances status: Secondary | ICD-10-CM

## 2017-12-27 DIAGNOSIS — K831 Obstruction of bile duct: Secondary | ICD-10-CM | POA: Diagnosis present

## 2017-12-27 DIAGNOSIS — N179 Acute kidney failure, unspecified: Secondary | ICD-10-CM | POA: Diagnosis present

## 2017-12-27 DIAGNOSIS — C801 Malignant (primary) neoplasm, unspecified: Secondary | ICD-10-CM

## 2017-12-27 DIAGNOSIS — C259 Malignant neoplasm of pancreas, unspecified: Secondary | ICD-10-CM | POA: Diagnosis not present

## 2017-12-27 DIAGNOSIS — I2510 Atherosclerotic heart disease of native coronary artery without angina pectoris: Secondary | ICD-10-CM | POA: Diagnosis present

## 2017-12-27 DIAGNOSIS — R109 Unspecified abdominal pain: Secondary | ICD-10-CM | POA: Diagnosis not present

## 2017-12-27 DIAGNOSIS — Z951 Presence of aortocoronary bypass graft: Secondary | ICD-10-CM | POA: Diagnosis not present

## 2017-12-27 DIAGNOSIS — E872 Acidosis: Secondary | ICD-10-CM | POA: Diagnosis present

## 2017-12-27 DIAGNOSIS — R1011 Right upper quadrant pain: Secondary | ICD-10-CM | POA: Diagnosis not present

## 2017-12-27 DIAGNOSIS — N184 Chronic kidney disease, stage 4 (severe): Secondary | ICD-10-CM | POA: Diagnosis present

## 2017-12-27 DIAGNOSIS — I129 Hypertensive chronic kidney disease with stage 1 through stage 4 chronic kidney disease, or unspecified chronic kidney disease: Secondary | ICD-10-CM | POA: Diagnosis not present

## 2017-12-27 DIAGNOSIS — E7800 Pure hypercholesterolemia, unspecified: Secondary | ICD-10-CM | POA: Diagnosis not present

## 2017-12-27 DIAGNOSIS — Z85038 Personal history of other malignant neoplasm of large intestine: Secondary | ICD-10-CM | POA: Diagnosis not present

## 2017-12-27 DIAGNOSIS — Z87891 Personal history of nicotine dependence: Secondary | ICD-10-CM

## 2017-12-27 DIAGNOSIS — R630 Anorexia: Secondary | ICD-10-CM | POA: Diagnosis present

## 2017-12-27 DIAGNOSIS — C250 Malignant neoplasm of head of pancreas: Secondary | ICD-10-CM | POA: Diagnosis present

## 2017-12-27 DIAGNOSIS — I252 Old myocardial infarction: Secondary | ICD-10-CM | POA: Diagnosis not present

## 2017-12-27 DIAGNOSIS — Z7982 Long term (current) use of aspirin: Secondary | ICD-10-CM | POA: Diagnosis not present

## 2017-12-27 DIAGNOSIS — I5022 Chronic systolic (congestive) heart failure: Secondary | ICD-10-CM | POA: Diagnosis present

## 2017-12-27 DIAGNOSIS — I130 Hypertensive heart and chronic kidney disease with heart failure and stage 1 through stage 4 chronic kidney disease, or unspecified chronic kidney disease: Secondary | ICD-10-CM | POA: Diagnosis present

## 2017-12-27 DIAGNOSIS — Z923 Personal history of irradiation: Secondary | ICD-10-CM

## 2017-12-27 DIAGNOSIS — Z8546 Personal history of malignant neoplasm of prostate: Secondary | ICD-10-CM | POA: Diagnosis not present

## 2017-12-27 DIAGNOSIS — R740 Nonspecific elevation of levels of transaminase and lactic acid dehydrogenase [LDH]: Secondary | ICD-10-CM | POA: Diagnosis not present

## 2017-12-27 DIAGNOSIS — K8689 Other specified diseases of pancreas: Secondary | ICD-10-CM

## 2017-12-27 DIAGNOSIS — I255 Ischemic cardiomyopathy: Secondary | ICD-10-CM | POA: Diagnosis present

## 2017-12-27 DIAGNOSIS — Z86718 Personal history of other venous thrombosis and embolism: Secondary | ICD-10-CM | POA: Diagnosis not present

## 2017-12-27 DIAGNOSIS — I700 Atherosclerosis of aorta: Secondary | ICD-10-CM | POA: Diagnosis present

## 2017-12-27 DIAGNOSIS — Z9221 Personal history of antineoplastic chemotherapy: Secondary | ICD-10-CM

## 2017-12-27 DIAGNOSIS — N189 Chronic kidney disease, unspecified: Secondary | ICD-10-CM | POA: Diagnosis not present

## 2017-12-27 DIAGNOSIS — Z7189 Other specified counseling: Secondary | ICD-10-CM | POA: Diagnosis not present

## 2017-12-27 DIAGNOSIS — R17 Unspecified jaundice: Secondary | ICD-10-CM | POA: Diagnosis not present

## 2017-12-27 DIAGNOSIS — I959 Hypotension, unspecified: Secondary | ICD-10-CM | POA: Diagnosis present

## 2017-12-27 DIAGNOSIS — Z515 Encounter for palliative care: Secondary | ICD-10-CM | POA: Diagnosis not present

## 2017-12-27 DIAGNOSIS — K838 Other specified diseases of biliary tract: Secondary | ICD-10-CM | POA: Diagnosis not present

## 2017-12-27 DIAGNOSIS — K8681 Exocrine pancreatic insufficiency: Secondary | ICD-10-CM | POA: Diagnosis present

## 2017-12-27 DIAGNOSIS — E785 Hyperlipidemia, unspecified: Secondary | ICD-10-CM | POA: Diagnosis present

## 2017-12-27 DIAGNOSIS — Z89021 Acquired absence of right finger(s): Secondary | ICD-10-CM | POA: Diagnosis not present

## 2017-12-27 DIAGNOSIS — C61 Malignant neoplasm of prostate: Secondary | ICD-10-CM | POA: Diagnosis present

## 2017-12-27 DIAGNOSIS — R599 Enlarged lymph nodes, unspecified: Secondary | ICD-10-CM | POA: Diagnosis present

## 2017-12-27 DIAGNOSIS — Z8507 Personal history of malignant neoplasm of pancreas: Secondary | ICD-10-CM

## 2017-12-27 DIAGNOSIS — Z79899 Other long term (current) drug therapy: Secondary | ICD-10-CM

## 2017-12-27 DIAGNOSIS — I10 Essential (primary) hypertension: Secondary | ICD-10-CM | POA: Diagnosis present

## 2017-12-27 DIAGNOSIS — K8050 Calculus of bile duct without cholangitis or cholecystitis without obstruction: Secondary | ICD-10-CM | POA: Diagnosis not present

## 2017-12-27 LAB — CBC
HCT: 26.8 % — ABNORMAL LOW (ref 39.0–52.0)
HEMOGLOBIN: 8.4 g/dL — AB (ref 13.0–17.0)
MCH: 27 pg (ref 26.0–34.0)
MCHC: 31.3 g/dL (ref 30.0–36.0)
MCV: 86.2 fL (ref 80.0–100.0)
Platelets: 268 10*3/uL (ref 150–400)
RBC: 3.11 MIL/uL — ABNORMAL LOW (ref 4.22–5.81)
RDW: 17.8 % — ABNORMAL HIGH (ref 11.5–15.5)
WBC: 4.3 10*3/uL (ref 4.0–10.5)
nRBC: 0 % (ref 0.0–0.2)

## 2017-12-27 LAB — URINALYSIS, ROUTINE W REFLEX MICROSCOPIC
Bilirubin Urine: NEGATIVE
Glucose, UA: NEGATIVE mg/dL
Ketones, ur: NEGATIVE mg/dL
Nitrite: NEGATIVE
Protein, ur: 100 mg/dL — AB
Specific Gravity, Urine: 1.008 (ref 1.005–1.030)
WBC, UA: >50 WBC/hpf — ABNORMAL HIGH (ref 0–5)
pH: 6 (ref 5.0–8.0)

## 2017-12-27 LAB — COMPREHENSIVE METABOLIC PANEL
ALT: 148 U/L — ABNORMAL HIGH (ref 0–44)
ANION GAP: 11 (ref 5–15)
AST: 111 U/L — ABNORMAL HIGH (ref 15–41)
Albumin: 3.3 g/dL — ABNORMAL LOW (ref 3.5–5.0)
Alkaline Phosphatase: 520 U/L — ABNORMAL HIGH (ref 38–126)
BUN: 52 mg/dL — ABNORMAL HIGH (ref 8–23)
CO2: 14 mmol/L — ABNORMAL LOW (ref 22–32)
Calcium: 9.5 mg/dL (ref 8.9–10.3)
Chloride: 110 mmol/L (ref 98–111)
Creatinine, Ser: 4.01 mg/dL — ABNORMAL HIGH (ref 0.61–1.24)
GFR calc Af Amer: 16 mL/min — ABNORMAL LOW (ref >60)
GFR calc non Af Amer: 14 mL/min — ABNORMAL LOW (ref >60)
Glucose, Bld: 111 mg/dL — ABNORMAL HIGH (ref 70–99)
POTASSIUM: 4 mmol/L (ref 3.5–5.1)
Sodium: 135 mmol/L (ref 135–145)
Total Bilirubin: 6.2 mg/dL — ABNORMAL HIGH (ref 0.3–1.2)
Total Protein: 7.8 g/dL (ref 6.5–8.1)

## 2017-12-27 LAB — LIPASE, BLOOD: Lipase: 22 U/L (ref 11–51)

## 2017-12-27 MED ORDER — ONDANSETRON HCL 4 MG PO TABS: 4.0000 mg | ORAL_TABLET | Freq: Four times a day (QID) | ORAL | Status: DC | PRN

## 2017-12-27 MED ORDER — SODIUM CHLORIDE 0.9 % IV BOLUS: 500.0000 mL | Freq: Once | INTRAVENOUS | Status: AC

## 2017-12-27 MED ORDER — ROSUVASTATIN CALCIUM 20 MG PO TABS
40.0000 mg | ORAL_TABLET | Freq: Every day | ORAL | Status: DC
  Administered 2017-12-28 – 2018-01-01 (×5): 40 mg via ORAL
  Filled 2017-12-27 (×5): qty 2

## 2017-12-27 MED ORDER — SODIUM CHLORIDE 0.9 % IV SOLN
INTRAVENOUS | Status: AC
  Administered 2017-12-27: 21:00:00 via INTRAVENOUS

## 2017-12-27 MED ORDER — ASPIRIN EC 81 MG PO TBEC
81.0000 mg | DELAYED_RELEASE_TABLET | Freq: Every day | ORAL | Status: DC
  Administered 2017-12-27 – 2018-01-01 (×5): 81 mg via ORAL
  Filled 2017-12-27 (×5): qty 1

## 2017-12-27 MED ORDER — CARVEDILOL 12.5 MG PO TABS
12.5000 mg | ORAL_TABLET | Freq: Two times a day (BID) | ORAL | Status: DC
  Administered 2017-12-28 – 2018-01-01 (×9): 12.5 mg via ORAL
  Filled 2017-12-27 (×9): qty 1

## 2017-12-27 MED ORDER — ONDANSETRON HCL 4 MG/2ML IJ SOLN: 4.0000 mg | Freq: Four times a day (QID) | INTRAMUSCULAR | Status: DC | PRN

## 2017-12-27 MED ORDER — TAMSULOSIN HCL 0.4 MG PO CAPS
0.4000 mg | ORAL_CAPSULE | Freq: Every day | ORAL | Status: DC
  Administered 2017-12-28 – 2017-12-31 (×4): 0.4 mg via ORAL
  Filled 2017-12-27 (×4): qty 1

## 2017-12-27 MED ORDER — SODIUM CHLORIDE 0.9 % IV BOLUS
500.0000 mL | Freq: Once | INTRAVENOUS | Status: AC
  Administered 2017-12-27: 500 mL via INTRAVENOUS

## 2017-12-27 NOTE — ED Triage Notes (Signed)
Pt reports lower abdominal pain and bloating x2 days. He reports that he also has no appetite. Denies N/V, but states that if he eats something, he belches all night. No fever. A&Ox4.

## 2017-12-27 NOTE — H&P (Signed)
History and Physical    [name] GDJ:[id number] DOB: [birth year] DOA: 12/27/2017  PCP: Patient, No Pcp Per  Patient coming from: Home  I have personally briefly reviewed patient's old medical records in [place]  Chief Complaint: Abdominal pain, nausea, vomiting  HPI: [name] is a 72 y.o. male with medical history significant for Pancreatic Cancer s/p SBRT, Prostate Cancer s/p TURP and XRT, CAD s/p CABG, CKD Stage IV, VSD s/p repair, HTN, and HLD who presents to the ED with 2 days of bilateral lower abdominal pain associated with nausea and vomiting.  He has been unable to maintain oral intake and has had associated lightheadedness without syncope.  He reports regular bowel movements and denies any dysuria.  He denies any fevers, chills, diaphoresis, chest pain, dyspnea.  ED Course:  Initial vitals showed BP 97/59, pulse 59, RR 18, temp 97.27F, SPO2 100% on room air.  Lab work was notable for AST 111, ALT 148, alkaline phosphatase 520, total bilirubin 6.2 all of which were essentially within normal limits on 10/05/2017.  Creatinine is 4.01 with GFR 16 compared to Cr 3.38 and GFR 19 on 10/05/2017.  CBC shows WBC 4.3, hemoglobin 8.4, platelets 268.  Lipase 22.  A RUQ U/S was negative for gallstones or cholecystitis, CBD diameter 0.8 cm, no acute finding.  A CT abdomen/pelvis without contrast was obtained which showed new intrahepatic and progressive extrahepatic biliary dilatation consistent with obstructive jaundice from local recurrence of pancreatic cancer or nodal adenopathy and biliary decompression was recommended per radiology read.  He was given two 500 mL NS boluses with improvement in blood pressure.  The hospitalist service was consulted for admission.  Review of Systems: As per HPI otherwise 10 point review of systems negative.    Past Medical History:  Diagnosis Date  . Acute MI, inferior wall ([place]) 2011  . Amputation finger    right first finger top portion age  9  . Aortic atherosclerosis ([place])   . ARF (acute renal failure) ([place]) 01/24/2015  . CHF (congestive heart failure) ([place])   . CKD (chronic kidney disease), stage III ([place]) 11/2015  . Coronary artery disease cardioloigst-  dr Stanford Breed   hx inferior MI 10-08-2009 emergency CABG x4  . DOE (dyspnea on exertion)    since starting chemo  . DVT (deep venous thrombosis) ([place]) 11/05/2016   right proximal-mid peroneal veins  . Fracture of left hip ([place])   . H/O hyperkalemia   . History of acute inferior wall myocardial infarction 10/08/2009  . History of blood transfusion   . History of chemotherapy   . History of echocardiogram 03/2012   EF 45%, LAE, Mild MR, no residual VSD  . History of first degree heart block   . History of metabolic acidosis   . History of thrombocytopenia   . History of urethral stricture   . History of UTI   . Hyperlipidemia   . Hypertension   . Ischemic cardiomyopathy   . Kidney cysts    bilateral  . Multiple pulmonary nodules    Bilateral scattered  . Multiple thyroid nodules   . Obstructed, uropathy   . Obstructive uropathy   . Pancreatic carcinoma (HCC)    s/p SBRT  . Pneumonia    Childhood  . Prostate cancer ([place])   . S/P VSD repair    09/2009  . Systolic CHF, chronic ([place])   . VSD (ventricular septal defect) 09/2009   acute after MI    Past Surgical  History:  Procedure Laterality Date  . CARDIAC CATHETERIZATION    . CORONARY ARTERY BYPASS GRAFT     times 4  . CYSTOSCOPY W/ RETROGRADES N/A 04/03/2015   Procedure:  BLADDER BIOPSIES;  Surgeon: Festus Aloe, MD;  Location: [facility];  Service: Urology;  Laterality: N/A;  . CYSTOSCOPY W/ RETROGRADES Bilateral 06/27/2016   Procedure: CYSTOSCOPY WITH BILATERAL RETROGRADES;  Surgeon: Festus Aloe, MD;  Location: [facility];  Service: Urology;  Laterality: Bilateral;  . CYSTOSCOPY WITH BIOPSY N/A 06/27/2016   Procedure: CYSTOSCOPY WITH BLADDER BIOPSY URETHRAL BIOPSY;  Surgeon: Festus Aloe, MD;   Location: [facility];  Service: Urology;  Laterality: N/A;  . CYSTOSCOPY WITH URETHRAL DILATATION N/A 04/03/2015   Procedure: CYSTOSCOPY WITH BILATERAL RETROGRADES;  Surgeon: Festus Aloe, MD;  Location: [facility];  Service: Urology;  Laterality: N/A;  . EUS N/A 08/28/2016   Procedure: UPPER ENDOSCOPIC ULTRASOUND (EUS) RADIAL;  Surgeon: Milus Banister, MD;  Location: [facility];  Service: Endoscopy;  Laterality: N/A;  . flexible cystoscopy    . GOLD SEED IMPLANT N/A 07/03/2017   Procedure: GOLD SEED IMPLANT;  Surgeon: Festus Aloe, MD;  Location: [facility];  Service: Urology;  Laterality: N/A;  Needs Ultrasound Tech  . repair of post infarction posterior ventricular septal defect  09/2009  . SPACE OAR INSTILLATION N/A 07/03/2017   Procedure: SPACE OAR INSTILLATION;  Surgeon: Festus Aloe, MD;  [facility];  Service: Urology;  Laterality: N/A;  . TRANSURETHRAL RESECTION OF PROSTATE  06/27/2016   Procedure: TRANSURETHRAL RESECTION OF THE PROSTATE (TURP);  Surgeon: Festus Aloe, MD;  Location: [facility];  Service: Urology;;  . UPPER GI ENDOSCOPY       reports that he quit smoking about 7 years ago. His smoking use included cigarettes. He has a 67.50 pack-year smoking history. He has never used smokeless tobacco. He reports that he does not drink alcohol or use drugs.  Allergies  Allergen Reactions  . Chlorhexidine Gluconate Other (See Comments)    Red skin and flaked skin     Family History  Problem Relation Age of Onset  . Cancer Paternal Aunt        unknown type cancer   . Cancer Paternal Uncle        unknown type cancer     Prior to Admission medications   Medication Sig Start Date End Date Taking? Authorizing Provider  amLODipine (NORVASC) 10 MG tablet take 1/2 tablet by mouth once daily Patient taking differently: Take 5 mg by mouth daily.  03/05/17  Yes Lelon Perla, MD  carvedilol (COREG) 12.5 MG tablet Take 1 tablet (12.5  mg total) by mouth 2 (two) times daily with a meal. 11/13/17  Yes Crenshaw, Denice Bors, MD  rosuvastatin (CRESTOR) 40 MG tablet Take 1 tablet (40 mg total) by mouth daily. 11/13/17  Yes Lelon Perla, MD  tamsulosin (FLOMAX) 0.4 MG CAPS capsule Take 1 capsule (0.4 mg total) by mouth daily after supper. 10/23/17  Yes Bruning, Ashlyn, PA-C  aspirin EC 81 MG tablet Take 1 tablet (81 mg total) by mouth daily. 09/24/17   Lelon Perla, MD  cephALEXin (KEFLEX) 500 MG capsule Take 1 capsule (500 mg total) by mouth 4 (four) times daily. Patient not taking: Reported on 12/27/2017 10/12/17   Shanon Rosser, MD    Physical Exam: Vitals:   12/27/17 1519 12/27/17 1732 12/27/17 1940  BP: (!) 97/59 114/71 115/75  Pulse: (!) 59 64 66  Resp: 18 16  14  Temp: 97.6 F (36.4 C)  97.7 F (36.5 C)  TempSrc: Oral  Oral  SpO2: 100% 100% 98%  Weight:   83.2 kg  Height:   5\' 4"  (1.626 m)    Constitutional: NAD, calm, comfortable Eyes: PERRL, scleral icterus ENMT: Mucous membranes are moist. Posterior pharynx clear of any exudate or lesions. Poor dentition.  Neck: normal, supple, no masses. Respiratory: Faint bibasilar inspiratory crackles. Normal respiratory effort. No accessory muscle use.  Cardiovascular: Regular rate and rhythm, no murmurs / rubs / gallops. No extremity edema.  Abdomen: Bilateral lower abdominal tenderness to palpation. No hepatosplenomegaly. Bowel sounds positive.  Musculoskeletal: no clubbing / cyanosis. No joint deformity upper and lower extremities. Good ROM, no contractures. Normal muscle tone.  Skin: no rashes, lesions, ulcers. No induration Neurologic: CN 2-12 grossly intact. Sensation intact. Strength 5/5 in all 4.  Psychiatric: Normal judgment and insight. Alert and oriented x 3. Normal mood.     Labs on Admission: I have personally reviewed following labs and imaging studies  CBC: Recent Labs  Lab 12/27/17 1537  WBC 4.3  HGB 8.4*  HCT 26.8*  MCV 86.2  PLT 263    Basic Metabolic Panel: Recent Labs  Lab 12/27/17 1537  NA 135  K 4.0  CL 110  CO2 14*  GLUCOSE 111*  BUN 52*  CREATININE 4.01*  CALCIUM 9.5   GFR: Estimated Creatinine Clearance: 15.7 mL/min (A) (by C-G formula based on SCr of 4.01 mg/dL (H)). Liver Function Tests: Recent Labs  Lab 12/27/17 1537  AST 111*  ALT 148*  ALKPHOS 520*  BILITOT 6.2*  PROT 7.8  ALBUMIN 3.3*   Recent Labs  Lab 12/27/17 1537  LIPASE 22   No results for input(s): AMMONIA in the last 168 hours. Coagulation Profile: No results for input(s): INR, PROTIME in the last 168 hours. Cardiac Enzymes: No results for input(s): CKTOTAL, CKMB, CKMBINDEX, TROPONINI in the last 168 hours. BNP (last 3 results) No results for input(s): PROBNP in the last 8760 hours. HbA1C: No results for input(s): HGBA1C in the last 72 hours. CBG: No results for input(s): GLUCAP in the last 168 hours. Lipid Profile: No results for input(s): CHOL, HDL, LDLCALC, TRIG, CHOLHDL, LDLDIRECT in the last 72 hours. Thyroid Function Tests: No results for input(s): TSH, T4TOTAL, FREET4, T3FREE, THYROIDAB in the last 72 hours. Anemia Panel: No results for input(s): VITAMINB12, FOLATE, FERRITIN, TIBC, IRON, RETICCTPCT in the last 72 hours. Urine analysis:    Component Value Date/Time   COLORURINE AMBER (A) 12/27/2017 1825   APPEARANCEUR HAZY (A) 12/27/2017 1825   LABSPEC 1.008 12/27/2017 1825   PHURINE 6.0 12/27/2017 1825   GLUCOSEU NEGATIVE 12/27/2017 1825   HGBUR MODERATE (A) 12/27/2017 1825   BILIRUBINUR NEGATIVE 12/27/2017 1825   KETONESUR NEGATIVE 12/27/2017 1825   PROTEINUR 100 (A) 12/27/2017 1825   UROBILINOGEN 0.2 08/29/2010 1726   NITRITE NEGATIVE 12/27/2017 1825   LEUKOCYTESUR LARGE (A) 12/27/2017 1825    Radiological Exams on Admission: Ct Abdomen Pelvis Wo Contrast  Result Date: 12/27/2017 CLINICAL DATA:  Low abdominal pain with bloating for 2 days. No appetite. History of pancreatic cancer. Elevated liver  function studies. EXAM: CT ABDOMEN AND PELVIS WITHOUT CONTRAST TECHNIQUE: Multidetector CT imaging of the abdomen and pelvis was performed following the standard protocol without IV contrast. COMPARISON:  Abdominopelvic CT 10/05/2017. FINDINGS: Lower chest: Stable mild chronic central airway thickening in both lung bases. The heart is enlarged with a chronic calcified aneurysm involving inferior wall left ventricle.  No significant pleural or pericardial effusion. Hepatobiliary: No focal hepatic lesions are identified on noncontrast imaging. However, there is new intrahepatic biliary dilatation with increased extrahepatic biliary dilatation. The gallbladder appears unremarkable. Pancreas: Ill-defined enlargement of the pancreatic head is grossly stable, suboptimally evaluated on this noncontrast study. There is a stable surgical clip or fiduciary marker at the pancreatic neck. There is stable pancreatic atrophy without significant ductal dilatation or surrounding inflammatory change. Spleen: Normal in size without focal abnormality. Adrenals/Urinary Tract: Both adrenal glands appear normal. The kidneys appears similar to the previous study. There is bilateral renal cortical thinning with low-density and hyperdense lesions bilaterally. There is mild chronic collecting system dilatation and wall thickening. There is moderate diffuse bladder wall thickening. Stomach/Bowel: No evidence of bowel wall thickening, distention or surrounding inflammatory change. The appendix appears normal. Vascular/Lymphatic: There is increased low-density in the porta hepatis, difficult to differentiate from the portal vein and bile duct on this noncontrast study, although suspicious for worsening portacaval adenopathy given the worsening biliary dilatation. This measures up to 16 mm short axis on image 22. There are no enlarged retroperitoneal or pelvic lymph nodes. Diffuse aortic and branch vessel atherosclerosis. Reproductive: Radiation  clips in the prostate gland and mild enlargement of the gland are stable. Other: No ascites or peritoneal nodularity. Musculoskeletal: No acute or significant osseous findings. Chronic biconcave deformities in the lower lumbar spine are stable. No evidence of osseous metastatic disease. Stable lipomas in the external oblique muscles of the abdominal wall bilaterally. IMPRESSION: 1. New intrahepatic and progressive extrahepatic biliary dilatation consistent with obstructive jaundice from local recurrence of pancreatic cancer or nodal adenopathy in the porta hepatis. Biliary decompression recommended. 2. No hepatic metastases identified on noncontrast imaging. No ascites or peritoneal nodularity. 3. Stable chronic mild renal collecting system dilatation and wall thickening without secondary signs of acute obstruction. Chronic bladder wall thickening. 4. Chronic calcified left ventricular aneurysm. Electronically Signed   By: Richardean Sale M.D.   On: 12/27/2017 18:00   US Abdomen Limited Ruq  Result Date: 12/27/2017 CLINICAL DATA:  Right upper quadrant pain for 3 days. EXAM: ULTRASOUND ABDOMEN LIMITED RIGHT UPPER QUADRANT COMPARISON:  None. FINDINGS: Gallbladder: No gallstones or wall thickening visualized. No sonographic Murphy sign noted by sonographer. Common bile duct: Diameter: 0.8 cm. Liver: No focal lesion identified. Within normal limits in parenchymal echogenicity. Portal vein is patent on color Doppler imaging with normal direction of blood flow towards the liver. IMPRESSION: Negative exam. Electronically Signed   By: Inge Rise M.D.   On: 12/27/2017 17:35     Assessment/Plan Principal Problem:   Obstructive jaundice due to cancer [facility]) Active Problems:   HYPERTENSION, BENIGN   Coronary atherosclerosis of native coronary artery   Hyperlipidemia   Malignant neoplasm of prostate ([place])   Pancreatic cancer (HCC)   CKD (chronic kidney disease), stage IV ([place])    RENNY REMER is a 72  y.o. male with medical history significant for Pancreatic Cancer s/p SBRT, Prostate Cancer s/p TURP and XRT, CAD s/p CABG, ischemic cardiomyopathy, CKD Stage IV, VSD s/p repair, HTN, and HLD who presents to the ED with 2 days of bilateral lower abdominal pain associated with nausea and vomiting.  CT imaging showed new intrahepatic and progressive extrahepatic biliary dilatation consistent with obstructive jaundice from local recurrence of pancreatic cancer or nodal adenopathy and biliary decompression was recommended.  Obstructive jaundice in the setting of pancreatic cancer: Patient with history of pancreatic cancer s/p SBRT.  He follows with oncology,  Dr. Burr Medico, and radiation oncology, Dr. Tammi Klippel with recent completion of radiation therapy.  CT concerning for local recurrence of pancreatic cancer.  At this time patient is otherwise hemodynamically stable, alert and oriented. -GI consult in a.m. -We will make n.p.o. at midnight, gentle IV fluids overnight -Repeat CMP in a.m.  CKD stage IV: Creatinine is 4.01 with GFR 16 compared to Cr 3.38 and GFR 19 on 10/05/2017. -Gentle IV fluids overnight and continue to monitor -Avoid nephrotoxic agents  CAD s/p CABG: Chronic issue and stable, denies any chest pain. -Continue aspirin, Crestor, carvedilol  Hypertension: Initially hypotensive, now normotensive after initial fluids. -Hold home amlodipine -Continue home carvedilol -Gentle fluids overnight  Hyperlipidemia: -Continue Crestor  Prostate cancer: S/p TURP and XRT. -Continue tamsulosin   DVT prophylaxis: SCDs  Code Status: Full Code   Family Communication: Not present on admission Disposition Plan: Pending work-up and management of obstructive jaundice and pancreatic cancer Consults called: EDP and I attempted phone consultation with GI through answering service, no call back. Please consult in AM. Admission status: Inpatient   Zada Finders MD [facility] Pager  [phone]  If 7PM-7AM, please contact night-coverage [email or web address] Password [email or web address]  12/27/2017, 10:00 PM

## 2017-12-27 NOTE — ED Notes (Signed)
Attempted to call report. Wyatt Portela told me she wasn't going to take report because this patient was going to a nurse that hadn't clocked in yet. Bianca then put Dionna on the phone. Dionna told me she'd be happy to take report and pass it off in 5 minutes to the next nurse. I gave Dionna full report on this patient. She then told me that the night shift nurse was there to accept the patient.

## 2017-12-27 NOTE — Progress Notes (Signed)
ED attempted to call report, call disconnected by reporting RN.

## 2017-12-27 NOTE — ED Notes (Signed)
Pt changing in to gown. NT made aware.

## 2017-12-27 NOTE — ED Provider Notes (Signed)
[facility] DEPT Provider Note   CSN: [id number] Arrival date & time: 12/27/17  1508     History   Chief Complaint Chief Complaint  Patient presents with  . Abdominal Pain    HPI [name] is a 72 y.o. male.  HPI   Patient is a 72 year old male with a history of acute renal failure, CHF, CKD, CAD, DVT, colon cancer, hyperlipidemia, hypertension, pancreatic cancer, prostate cancer,, presents the emergency department today for evaluation of generalized abdominal pain that began 2 to 3 days ago.  Patient states that pain is constant and severe in nature.  Rates pain 7-8/10.  Reports associated nausea and vomiting that began yesterday.  States he vomited undigested food yesterday.  He states he has been having normal bowel movements and passing gas normally.  He denies abdominal distention.  No diarrhea or bloody stools.  No hematemesis.  No fevers or chills.  States that he has been exposed to sick contacts with similar symptoms.  Denies urinary symptoms.  Oncologist: Dr. Burr Medico  Past Medical History:  Diagnosis Date  . Acute MI, inferior wall ([place]) 2011  . Amputation finger    right first finger top portion age 65  . Aortic atherosclerosis ([place])   . ARF (acute renal failure) ([place]) 01/24/2015  . CHF (congestive heart failure) ([place])   . CKD (chronic kidney disease), stage III ([place]) 11/2015  . Coronary artery disease cardioloigst-  dr Stanford Breed   hx inferior MI 10-08-2009 emergency CABG x4  . DOE (dyspnea on exertion)    since starting chemo  . DVT (deep venous thrombosis) ([place]) 11/05/2016   right proximal-mid peroneal veins  . Fracture of left hip ([place])   . H/O hyperkalemia   . History of acute inferior wall myocardial infarction 10/08/2009  . History of blood transfusion   . History of chemotherapy   . History of echocardiogram 03/2012   EF 45%, LAE, Mild MR, no residual VSD  . History of first degree heart block   . History of metabolic  acidosis   . History of thrombocytopenia   . History of urethral stricture   . History of UTI   . Hyperlipidemia   . Hypertension   . Ischemic cardiomyopathy   . Kidney cysts    bilateral  . Multiple pulmonary nodules    Bilateral scattered  . Multiple thyroid nodules   . Obstructed, uropathy   . Obstructive uropathy   . Pancreatic carcinoma (HCC)    s/p SBRT  . Pneumonia    Childhood  . Prostate cancer ([place])   . S/P VSD repair    09/2009  . Systolic CHF, chronic ([place])   . VSD (ventricular septal defect) 09/2009   acute after MI    Patient Active Problem List   Diagnosis Date Noted  . Obstructive jaundice due to cancer ([place]) 12/27/2017  . CKD (chronic kidney disease), stage IV ([place]) 12/27/2017  . DVT (deep venous thrombosis) ([place]) 11/06/2016  . Malignant neoplasm of head of pancreas ([name]) 10/03/2016  . Pancreatic cancer ([place]) 09/12/2016  . Pancreatic mass   . Malignant neoplasm of prostate ([place]) 07/25/2016  . Abdominal pain 11/17/2015  . Acute renal failure (ARF) ([place]) 11/17/2015  . Hydronephrosis 11/17/2015  . Urethral stricture 11/17/2015  . Acute cystitis with hematuria   . Hyperkalemia, diminished renal excretion   . Diabetes mellitus screening 02/14/2015  . Uropathy, obstructive 01/30/2015  . UTI (urinary tract infection) 01/30/2015  . Trichomonas infection 01/30/2015  .  Hydronephrosis determined by ultrasound   . Metabolic acidemia   . Protein-calorie malnutrition ([place])   . Microcytic anemia   . Acute renal failure superimposed on stage 3 chronic kidney disease ([place]) 01/25/2015  . Acute left flank pain 01/25/2015  . Hyperkalemia 01/25/2015  . Thrombocytosis ([place]) 01/25/2015  . Metabolic acidosis, increased anion gap (IAG) 01/25/2015  . Hyperlipidemia 02/19/2012  . Cardiomyopathy, ischemic 02/19/2012  . Coronary atherosclerosis of native coronary artery 07/01/2010  . Other and unspecified hyperlipidemia 07/01/2010  . Chronic systolic CHF (congestive heart  failure) ([place]) 12/11/2009  . HYPERTENSION, BENIGN 11/07/2009  . CAD, AUTOLOGOUS BYPASS GRAFT 11/07/2009  . Ventricular septal defect 11/07/2009  . MURMUR 11/07/2009    Past Surgical History:  Procedure Laterality Date  . CARDIAC CATHETERIZATION    . CORONARY ARTERY BYPASS GRAFT     times 4  . CYSTOSCOPY W/ RETROGRADES N/A 04/03/2015   Procedure:  BLADDER BIOPSIES;  Surgeon: Festus Aloe, MD;  Location: [facility];  Service: Urology;  Laterality: N/A;  . CYSTOSCOPY W/ RETROGRADES Bilateral 06/27/2016   Procedure: CYSTOSCOPY WITH BILATERAL RETROGRADES;  Surgeon: Festus Aloe, MD;  Location: [facility];  Service: Urology;  Laterality: Bilateral;  . CYSTOSCOPY WITH BIOPSY N/A 06/27/2016   Procedure: CYSTOSCOPY WITH BLADDER BIOPSY URETHRAL BIOPSY;  Surgeon: Festus Aloe, MD;  Location: [facility];  Service: Urology;  Laterality: N/A;  . CYSTOSCOPY WITH URETHRAL DILATATION N/A 04/03/2015   Procedure: CYSTOSCOPY WITH BILATERAL RETROGRADES;  Surgeon: Festus Aloe, MD;  Location: [facility];  Service: Urology;  Laterality: N/A;  . EUS N/A 08/28/2016   Procedure: UPPER ENDOSCOPIC ULTRASOUND (EUS) RADIAL;  Surgeon: Milus Banister, MD;  Location: [facility];  Service: Endoscopy;  Laterality: N/A;  . flexible cystoscopy    . GOLD SEED IMPLANT N/A 07/03/2017   Procedure: GOLD SEED IMPLANT;  Surgeon: Festus Aloe, MD;  [facility]  Service: Urology;  Laterality: N/A;  Needs Ultrasound Tech  . repair of post infarction posterior ventricular septal defect  09/2009  . SPACE OAR INSTILLATION N/A 07/03/2017   Procedure: SPACE OAR INSTILLATION;  Surgeon: Festus Aloe, MD;  Location: [facility];  Service: Urology;  Laterality: N/A;  . TRANSURETHRAL RESECTION OF PROSTATE  06/27/2016   Procedure: TRANSURETHRAL RESECTION OF THE PROSTATE (TURP);  Surgeon: Festus Aloe, MD;  Location: [facility];  Service: Urology;;  . UPPER GI ENDOSCOPY          Home  Medications    Prior to Admission medications   Medication Sig Start Date End Date Taking? Authorizing Provider  amLODipine (NORVASC) 10 MG tablet take 1/2 tablet by mouth once daily Patient taking differently: Take 5 mg by mouth daily.  03/05/17  Yes Lelon Perla, MD  carvedilol (COREG) 12.5 MG tablet Take 1 tablet (12.5 mg total) by mouth 2 (two) times daily with a meal. 11/13/17  Yes Crenshaw, Denice Bors, MD  rosuvastatin (CRESTOR) 40 MG tablet Take 1 tablet (40 mg total) by mouth daily. 11/13/17  Yes Lelon Perla, MD  tamsulosin (FLOMAX) 0.4 MG CAPS capsule Take 1 capsule (0.4 mg total) by mouth daily after supper. 10/23/17  Yes Bruning, Ashlyn, PA-C  aspirin EC 81 MG tablet Take 1 tablet (81 mg total) by mouth daily. 09/24/17   Lelon Perla, MD  cephALEXin (KEFLEX) 500 MG capsule Take 1 capsule (500 mg total) by mouth 4 (four) times daily. Patient not taking: Reported on 12/27/2017 10/12/17   Molpus, Jenny Reichmann, MD    Family History  Family History  Problem Relation Age of Onset  . Cancer Paternal Aunt        unknown type cancer   . Cancer Paternal Uncle        unknown type cancer    Social History Social History   Tobacco Use  . Smoking status: Former Smoker    Packs/day: 1.50    Years: 45.00    Pack years: 67.50    Types: Cigarettes    Last attempt to quit: 01/13/2010    Years since quitting: 7.9  . Smokeless tobacco: Never Used  Substance Use Topics  . Alcohol use: No    Comment: occasional   . Drug use: No     Allergies   Chlorhexidine gluconate   Review of Systems Review of Systems  Constitutional: Negative for chills and fever.  HENT: Negative for ear pain and sore throat.   Eyes: Negative for visual disturbance.  Respiratory: Negative for shortness of breath.   Cardiovascular: Negative for chest pain.  Gastrointestinal: Positive for abdominal pain, nausea and vomiting. Negative for blood in stool, constipation and diarrhea.  Genitourinary: Negative for  dysuria and hematuria.  Musculoskeletal: Negative for back pain.  Skin: Negative for rash.  Neurological: Negative for seizures and syncope.  All other systems reviewed and are negative.    Physical Exam Updated Vital Signs BP 115/75 (BP Location: Right Arm)   Pulse 66   Temp 97.7 F (36.5 C) (Oral)   Resp 14   Ht '5\' 4"'$  (1.626 m)   Wt 83.2 kg   SpO2 98%   BMI 31.48 kg/m   Physical Exam Vitals signs and nursing note reviewed.  Constitutional:      General: He is not in acute distress.    Appearance: He is well-developed. He is not ill-appearing.  HENT:     Head: Normocephalic and atraumatic.     Mouth/Throat:     Comments: Dry mucous membranes Eyes:     General: Scleral icterus present.     Conjunctiva/sclera: Conjunctivae normal.  Neck:     Musculoskeletal: Neck supple.  Cardiovascular:     Rate and Rhythm: Normal rate and regular rhythm.     Heart sounds: No murmur.  Pulmonary:     Effort: Pulmonary effort is normal. No respiratory distress.     Breath sounds: Normal breath sounds.  Abdominal:     General: Abdomen is protuberant. Bowel sounds are normal.     Palpations: Abdomen is soft.     Tenderness: There is abdominal tenderness in the left upper quadrant and left lower quadrant. There is guarding (LLQ). There is no right CVA tenderness, left CVA tenderness or rebound.  Skin:    General: Skin is warm and dry.     Capillary Refill: Capillary refill takes less than 2 seconds.  Neurological:     Mental Status: He is alert.  Psychiatric:        Mood and Affect: Mood normal.     ED Treatments / Results  Labs (all labs ordered are listed, but only abnormal results are displayed) Labs Reviewed  COMPREHENSIVE METABOLIC PANEL - Abnormal; Notable for the following components:      Result Value   CO2 14 (*)    Glucose, Bld 111 (*)    BUN 52 (*)    Creatinine, Ser 4.01 (*)    Albumin 3.3 (*)    AST 111 (*)    ALT 148 (*)    Alkaline Phosphatase 520 (*)  Total Bilirubin 6.2 (*)    GFR calc non Af Amer 14 (*)    GFR calc Af Amer 16 (*)    All other components within normal limits  CBC - Abnormal; Notable for the following components:   RBC 3.11 (*)    Hemoglobin 8.4 (*)    HCT 26.8 (*)    RDW 17.8 (*)    All other components within normal limits  URINALYSIS, ROUTINE W REFLEX MICROSCOPIC - Abnormal; Notable for the following components:   Color, Urine AMBER (*)    APPearance HAZY (*)    Hgb urine dipstick MODERATE (*)    Protein, ur 100 (*)    Leukocytes, UA LARGE (*)    WBC, UA >50 (*)    Bacteria, UA RARE (*)    All other components within normal limits  LIPASE, BLOOD  COMPREHENSIVE METABOLIC PANEL  CBC    EKG None  Radiology Ct Abdomen Pelvis Wo Contrast  Result Date: 12/27/2017 CLINICAL DATA:  Low abdominal pain with bloating for 2 days. No appetite. History of pancreatic cancer. Elevated liver function studies. EXAM: CT ABDOMEN AND PELVIS WITHOUT CONTRAST TECHNIQUE: Multidetector CT imaging of the abdomen and pelvis was performed following the standard protocol without IV contrast. COMPARISON:  Abdominopelvic CT 10/05/2017. FINDINGS: Lower chest: Stable mild chronic central airway thickening in both lung bases. The heart is enlarged with a chronic calcified aneurysm involving inferior wall left ventricle. No significant pleural or pericardial effusion. Hepatobiliary: No focal hepatic lesions are identified on noncontrast imaging. However, there is new intrahepatic biliary dilatation with increased extrahepatic biliary dilatation. The gallbladder appears unremarkable. Pancreas: Ill-defined enlargement of the pancreatic head is grossly stable, suboptimally evaluated on this noncontrast study. There is a stable surgical clip or fiduciary marker at the pancreatic neck. There is stable pancreatic atrophy without significant ductal dilatation or surrounding inflammatory change. Spleen: Normal in size without focal abnormality.  Adrenals/Urinary Tract: Both adrenal glands appear normal. The kidneys appears similar to the previous study. There is bilateral renal cortical thinning with low-density and hyperdense lesions bilaterally. There is mild chronic collecting system dilatation and wall thickening. There is moderate diffuse bladder wall thickening. Stomach/Bowel: No evidence of bowel wall thickening, distention or surrounding inflammatory change. The appendix appears normal. Vascular/Lymphatic: There is increased low-density in the porta hepatis, difficult to differentiate from the portal vein and bile duct on this noncontrast study, although suspicious for worsening portacaval adenopathy given the worsening biliary dilatation. This measures up to 16 mm short axis on image 22. There are no enlarged retroperitoneal or pelvic lymph nodes. Diffuse aortic and branch vessel atherosclerosis. Reproductive: Radiation clips in the prostate gland and mild enlargement of the gland are stable. Other: No ascites or peritoneal nodularity. Musculoskeletal: No acute or significant osseous findings. Chronic biconcave deformities in the lower lumbar spine are stable. No evidence of osseous metastatic disease. Stable lipomas in the external oblique muscles of the abdominal wall bilaterally. IMPRESSION: 1. New intrahepatic and progressive extrahepatic biliary dilatation consistent with obstructive jaundice from local recurrence of pancreatic cancer or nodal adenopathy in the porta hepatis. Biliary decompression recommended. 2. No hepatic metastases identified on noncontrast imaging. No ascites or peritoneal nodularity. 3. Stable chronic mild renal collecting system dilatation and wall thickening without secondary signs of acute obstruction. Chronic bladder wall thickening. 4. Chronic calcified left ventricular aneurysm. Electronically Signed   By: Richardean Sale M.D.   On: 12/27/2017 18:00   US Abdomen Limited Ruq  Result Date: 12/27/2017 CLINICAL  DATA:  Right upper quadrant pain for 3 days. EXAM: ULTRASOUND ABDOMEN LIMITED RIGHT UPPER QUADRANT COMPARISON:  None. FINDINGS: Gallbladder: No gallstones or wall thickening visualized. No sonographic Murphy sign noted by sonographer. Common bile duct: Diameter: 0.8 cm. Liver: No focal lesion identified. Within normal limits in parenchymal echogenicity. Portal vein is patent on color Doppler imaging with normal direction of blood flow towards the liver. IMPRESSION: Negative exam. Electronically Signed   By: Inge Rise M.D.   On: 12/27/2017 17:35    Procedures Procedures (including critical care time)  Medications Ordered in ED Medications  ondansetron (ZOFRAN) tablet 4 mg (has no administration in time range)    Or  ondansetron (ZOFRAN) injection 4 mg (has no administration in time range)  carvedilol (COREG) tablet 12.5 mg (has no administration in time range)  rosuvastatin (CRESTOR) tablet 40 mg (has no administration in time range)  tamsulosin (FLOMAX) capsule 0.4 mg (has no administration in time range)  aspirin EC tablet 81 mg (81 mg Oral Given 12/27/17 2010)  0.9 %  sodium chloride infusion ( Intravenous New Bag/Given 12/27/17 2054)  sodium chloride 0.9 % bolus 500 mL (0 mLs Intravenous Stopped 12/27/17 1904)  sodium chloride 0.9 % bolus 500 mL (0 mLs Intravenous Duplicate 27/06/23 7628)     Initial Impression / Assessment and Plan / ED Course  I have reviewed the triage vital signs and the nursing notes.  Pertinent labs & imaging results that were available during my care of the patient were reviewed by me and considered in my medical decision making (see chart for details).     Final Clinical Impressions(s) / ED Diagnoses   Final diagnoses:  RUQ discomfort   Patient with a history of prostate cancer, pancreatic cancer, who presents the emergency department today complaining of abdominal pain, nausea and vomiting for the last several days.  Still having normal stools  passing gas.  He is slightly hypotensive on arrival.  Not tachycardic.  Afebrile.  On exam has left lower quadrant and left upper quadrant tenderness with guarding in the left lower quadrant.  No rigidity or rebound tenderness present.  CBC without leukocytosis, worsening anemia from 2 months ago.  Hemoglobin 8.4 down from 9.6. CMP with, low bicarb at 14.  BUN and creatinine are elevated at 52/4.01.  Creatinine is worsening from 2 months ago.  AST and ALT are elevated at 111/148 which is new from 2 months ago.  Alk phos and total bilirubin are also elevated at 520 and 6.2 both new from prior. Lipase is normal. UA pending  CT abd/pelvis with new intrahepatic and progressive extrahepatic biliary dilatation consistent with obstructive jaundice from local recurrence of pancreatic cancer or nodal adenopathy in the porta hepatis. Biliary decompression recommended.  RUQ [place] negative.   Will plan for admission for biliary decompression.  6:29 PM consult with Dr. Posey Pronto with hospitalist service who accepts patient for admission.  Request GI consult.  Consult was placed to gastroenterology Velora Heckler, Pt has seen Dr. Ardis Hughs) however did not receive a call back.  Inform Dr. Posey Pronto that I was unable to contact GI.  ED Discharge Orders    None       Bishop [place] 12/27/17 2057    Quintella Reichert, MD 12/30/17 [phone]

## 2017-12-28 ENCOUNTER — Inpatient Hospital Stay (HOSPITAL_COMMUNITY): Payer: Medicare Other

## 2017-12-28 ENCOUNTER — Inpatient Hospital Stay: Payer: Medicare Other | Admitting: Hematology

## 2017-12-28 ENCOUNTER — Inpatient Hospital Stay: Payer: Medicare Other

## 2017-12-28 DIAGNOSIS — C61 Malignant neoplasm of prostate: Secondary | ICD-10-CM

## 2017-12-28 DIAGNOSIS — N184 Chronic kidney disease, stage 4 (severe): Secondary | ICD-10-CM

## 2017-12-28 DIAGNOSIS — E7800 Pure hypercholesterolemia, unspecified: Secondary | ICD-10-CM

## 2017-12-28 DIAGNOSIS — N189 Chronic kidney disease, unspecified: Secondary | ICD-10-CM

## 2017-12-28 DIAGNOSIS — K831 Obstruction of bile duct: Principal | ICD-10-CM

## 2017-12-28 DIAGNOSIS — Z951 Presence of aortocoronary bypass graft: Secondary | ICD-10-CM

## 2017-12-28 DIAGNOSIS — I2510 Atherosclerotic heart disease of native coronary artery without angina pectoris: Secondary | ICD-10-CM

## 2017-12-28 DIAGNOSIS — I129 Hypertensive chronic kidney disease with stage 1 through stage 4 chronic kidney disease, or unspecified chronic kidney disease: Secondary | ICD-10-CM

## 2017-12-28 DIAGNOSIS — N179 Acute kidney failure, unspecified: Secondary | ICD-10-CM

## 2017-12-28 DIAGNOSIS — C259 Malignant neoplasm of pancreas, unspecified: Secondary | ICD-10-CM

## 2017-12-28 LAB — COMPREHENSIVE METABOLIC PANEL
ALT: 136 U/L — ABNORMAL HIGH (ref 0–44)
AST: 102 U/L — ABNORMAL HIGH (ref 15–41)
Albumin: 3 g/dL — ABNORMAL LOW (ref 3.5–5.0)
Alkaline Phosphatase: 480 U/L — ABNORMAL HIGH (ref 38–126)
Anion gap: 11 (ref 5–15)
BUN: 50 mg/dL — ABNORMAL HIGH (ref 8–23)
CO2: 14 mmol/L — ABNORMAL LOW (ref 22–32)
Calcium: 9.4 mg/dL (ref 8.9–10.3)
Chloride: 112 mmol/L — ABNORMAL HIGH (ref 98–111)
Creatinine, Ser: 4.11 mg/dL — ABNORMAL HIGH (ref 0.61–1.24)
GFR calc Af Amer: 15 mL/min — ABNORMAL LOW (ref >60)
GFR calc non Af Amer: 13 mL/min — ABNORMAL LOW (ref >60)
Glucose, Bld: 101 mg/dL — ABNORMAL HIGH (ref 70–99)
Potassium: 4.1 mmol/L (ref 3.5–5.1)
Sodium: 137 mmol/L (ref 135–145)
TOTAL PROTEIN: 7.5 g/dL (ref 6.5–8.1)
Total Bilirubin: 5.6 mg/dL — ABNORMAL HIGH (ref 0.3–1.2)

## 2017-12-28 LAB — CBC
HCT: 26.2 % — ABNORMAL LOW (ref 39.0–52.0)
Hemoglobin: 8.2 g/dL — ABNORMAL LOW (ref 13.0–17.0)
MCH: 27.2 pg (ref 26.0–34.0)
MCHC: 31.3 g/dL (ref 30.0–36.0)
MCV: 87 fL (ref 80.0–100.0)
Platelets: 268 10*3/uL (ref 150–400)
RBC: 3.01 MIL/uL — ABNORMAL LOW (ref 4.22–5.81)
RDW: 18.1 % — ABNORMAL HIGH (ref 11.5–15.5)
WBC: 4.8 10*3/uL (ref 4.0–10.5)
nRBC: 0 % (ref 0.0–0.2)

## 2017-12-28 LAB — CREATININE, URINE, RANDOM: Creatinine, Urine: 63.44 mg/dL

## 2017-12-28 LAB — SODIUM, URINE, RANDOM: SODIUM UR: 46 mmol/L

## 2017-12-28 MED ORDER — SODIUM BICARBONATE 8.4 % IV SOLN
INTRAVENOUS | Status: DC
  Administered 2017-12-28: 10:00:00 via INTRAVENOUS
  Filled 2017-12-28 (×4): qty 850

## 2017-12-28 MED ORDER — SODIUM BICARBONATE 8.4 % IV SOLN
INTRAVENOUS | Status: DC
  Filled 2017-12-28: qty 1000

## 2017-12-28 MED ORDER — ACETAMINOPHEN 325 MG PO TABS
650.0000 mg | ORAL_TABLET | Freq: Four times a day (QID) | ORAL | Status: DC | PRN
  Administered 2017-12-28 – 2017-12-31 (×3): 650 mg via ORAL
  Filled 2017-12-28 (×3): qty 2

## 2017-12-28 MED ORDER — SODIUM CHLORIDE 0.9 % IV BOLUS: 500.0000 mL | Freq: Once | INTRAVENOUS | Status: DC

## 2017-12-28 NOTE — Progress Notes (Signed)
[name]   DOB:February 25, 1945   BT#:[id number]   TXM#:[id number]  Oncology f/u   Subjective: Patient was under my care for his pancreatic cancer, but lost follow-up after his last visit with me in March 2019.  He was actually scheduled to see me in my clinic today.  He presented to the hospital for worsening abdominal pain, low appetite and weight loss.  Was admitted for jaundice, and worsening renal function.  He states his abdominal pain has improved today, he is quite fatigued.  He is making good urine output.   Objective:  Vitals:   12/28/17 0806 12/28/17 1333  BP: 126/75 122/74  Pulse: 71 71  Resp:  16  Temp:  98.4 F (36.9 C)  SpO2:  99%    Body mass index is 32.01 kg/m.  Intake/Output Summary (Last 24 hours) at 12/28/2017 1725 Last data filed at 12/28/2017 1333 Gross per 24 hour  Intake 435.49 ml  Output 1340 ml  Net -904.51 ml     Sclerae (+) jaundice   Oropharynx clear  No peripheral adenopathy  Lungs clear -- no rales or rhonchi  Heart regular rate and rhythm  Abdomen benign  MSK no focal spinal tenderness, no peripheral edema  Neuro nonfocal   CBG (last 3)  No results for input(s): GLUCAP in the last 72 hours.   Labs:  Lab Results  Component Value Date   WBC 4.8 12/28/2017   HGB 8.2 (L) 12/28/2017   HCT 26.2 (L) 12/28/2017   MCV 87.0 12/28/2017   PLT 268 12/28/2017   NEUTROABS 2.4 03/17/2017   CMP Latest Ref Rng & Units 12/28/2017 12/27/2017 10/05/2017  Glucose 70 - 99 mg/dL 101(H) 111(H) 121(H)  BUN 8 - 23 mg/dL 50(H) 52(H) 52(H)  Creatinine 0.61 - 1.24 mg/dL 4.11(H) 4.01(H) 3.38(H)  Sodium 135 - 145 mmol/L 137 135 136  Potassium 3.5 - 5.1 mmol/L 4.1 4.0 4.8  Chloride 98 - 111 mmol/L 112(H) 110 111  CO2 22 - 32 mmol/L 14(L) 14(L) 17(L)  Calcium 8.9 - 10.3 mg/dL 9.4 9.5 9.9  Total Protein 6.5 - 8.1 g/dL 7.5 7.8 8.4(H)  Total Bilirubin 0.3 - 1.2 mg/dL 5.6(H) 6.2(H) 0.3  Alkaline Phos 38 - 126 U/L 480(H) 520(H) 86  AST 15 - 41 U/L 102(H) 111(H)  11(L)  ALT 0 - 44 U/L 136(H) 148(H) 9     Urine Studies No results for input(s): UHGB, CRYS in the last 72 hours.  Invalid input(s): UACOL, UAPR, USPG, UPH, UTP, UGL, UKET, UBIL, UNIT, UROB, ULEU, UEPI, UWBC, URBC, UBAC, CAST, UCOM, BILUA  Basic Metabolic Panel: Recent Labs  Lab 12/27/17 1537 12/28/17 0506  NA 135 137  K 4.0 4.1  CL 110 112*  CO2 14* 14*  GLUCOSE 111* 101*  BUN 52* 50*  CREATININE 4.01* 4.11*  CALCIUM 9.5 9.4   GFR Estimated Creatinine Clearance: 15.5 mL/min (A) (by C-G formula based on SCr of 4.11 mg/dL (H)). Liver Function Tests: Recent Labs  Lab 12/27/17 1537 12/28/17 0506  AST 111* 102*  ALT 148* 136*  ALKPHOS 520* 480*  BILITOT 6.2* 5.6*  PROT 7.8 7.5  ALBUMIN 3.3* 3.0*   Recent Labs  Lab 12/27/17 1537  LIPASE 22   No results for input(s): AMMONIA in the last 168 hours. Coagulation profile No results for input(s): INR, PROTIME in the last 168 hours.  CBC: Recent Labs  Lab 12/27/17 1537 12/28/17 0506  WBC 4.3 4.8  HGB 8.4* 8.2*  HCT 26.8* 26.2*  MCV 86.2  87.0  PLT 268 268   Cardiac Enzymes: No results for input(s): CKTOTAL, CKMB, CKMBINDEX, TROPONINI in the last 168 hours. BNP: Invalid input(s): POCBNP CBG: No results for input(s): GLUCAP in the last 168 hours. D-Dimer No results for input(s): DDIMER in the last 72 hours. Hgb A1c No results for input(s): HGBA1C in the last 72 hours. Lipid Profile No results for input(s): CHOL, HDL, LDLCALC, TRIG, CHOLHDL, LDLDIRECT in the last 72 hours. Thyroid function studies No results for input(s): TSH, T4TOTAL, T3FREE, THYROIDAB in the last 72 hours.  Invalid input(s): FREET3 Anemia work up No results for input(s): VITAMINB12, FOLATE, FERRITIN, TIBC, IRON, RETICCTPCT in the last 72 hours. Microbiology No results found for this or any previous visit (from the past 240 hour(s)).    Studies:  Ct Abdomen Pelvis Wo Contrast  Result Date: 12/27/2017 CLINICAL DATA:  Low abdominal  pain with bloating for 2 days. No appetite. History of pancreatic cancer. Elevated liver function studies. EXAM: CT ABDOMEN AND PELVIS WITHOUT CONTRAST TECHNIQUE: Multidetector CT imaging of the abdomen and pelvis was performed following the standard protocol without IV contrast. COMPARISON:  Abdominopelvic CT 10/05/2017. FINDINGS: Lower chest: Stable mild chronic central airway thickening in both lung bases. The heart is enlarged with a chronic calcified aneurysm involving inferior wall left ventricle. No significant pleural or pericardial effusion. Hepatobiliary: No focal hepatic lesions are identified on noncontrast imaging. However, there is new intrahepatic biliary dilatation with increased extrahepatic biliary dilatation. The gallbladder appears unremarkable. Pancreas: Ill-defined enlargement of the pancreatic head is grossly stable, suboptimally evaluated on this noncontrast study. There is a stable surgical clip or fiduciary marker at the pancreatic neck. There is stable pancreatic atrophy without significant ductal dilatation or surrounding inflammatory change. Spleen: Normal in size without focal abnormality. Adrenals/Urinary Tract: Both adrenal glands appear normal. The kidneys appears similar to the previous study. There is bilateral renal cortical thinning with low-density and hyperdense lesions bilaterally. There is mild chronic collecting system dilatation and wall thickening. There is moderate diffuse bladder wall thickening. Stomach/Bowel: No evidence of bowel wall thickening, distention or surrounding inflammatory change. The appendix appears normal. Vascular/Lymphatic: There is increased low-density in the porta hepatis, difficult to differentiate from the portal vein and bile duct on this noncontrast study, although suspicious for worsening portacaval adenopathy given the worsening biliary dilatation. This measures up to 16 mm short axis on image 22. There are no enlarged retroperitoneal or  pelvic lymph nodes. Diffuse aortic and branch vessel atherosclerosis. Reproductive: Radiation clips in the prostate gland and mild enlargement of the gland are stable. Other: No ascites or peritoneal nodularity. Musculoskeletal: No acute or significant osseous findings. Chronic biconcave deformities in the lower lumbar spine are stable. No evidence of osseous metastatic disease. Stable lipomas in the external oblique muscles of the abdominal wall bilaterally. IMPRESSION: 1. New intrahepatic and progressive extrahepatic biliary dilatation consistent with obstructive jaundice from local recurrence of pancreatic cancer or nodal adenopathy in the porta hepatis. Biliary decompression recommended. 2. No hepatic metastases identified on noncontrast imaging. No ascites or peritoneal nodularity. 3. Stable chronic mild renal collecting system dilatation and wall thickening without secondary signs of acute obstruction. Chronic bladder wall thickening. 4. Chronic calcified left ventricular aneurysm. Electronically Signed   By: Richardean Sale M.D.   On: 12/27/2017 18:00   US Renal  Result Date: 12/28/2017 CLINICAL DATA:  Acute renal insufficiency. EXAM: RENAL / URINARY TRACT ULTRASOUND COMPLETE COMPARISON:  None. FINDINGS: Right Kidney: Renal measurements: 10.6 x 5.7 x 4.7 cm =  volume: 135.2 mL . Echogenicity within normal limits. No mass or hydronephrosis visualized. Left Kidney: Renal measurements: 8.5 x 4.8 x 3.2 cm = volume: 68.4 mL. Contains 2 cysts with the largest measuring 1.7 cm. Bladder: Appears normal for degree of bladder distention. IMPRESSION: 1. No cause for acute renal failure identified.  Left renal cysts. Electronically Signed   By: Dorise Bullion III M.D   On: 12/28/2017 10:53   US Abdomen Limited Ruq  Result Date: 12/27/2017 CLINICAL DATA:  Right upper quadrant pain for 3 days. EXAM: ULTRASOUND ABDOMEN LIMITED RIGHT UPPER QUADRANT COMPARISON:  None. FINDINGS: Gallbladder: No gallstones or wall  thickening visualized. No sonographic Murphy sign noted by sonographer. Common bile duct: Diameter: 0.8 cm. Liver: No focal lesion identified. Within normal limits in parenchymal echogenicity. Portal vein is patent on color Doppler imaging with normal direction of blood flow towards the liver. IMPRESSION: Negative exam. Electronically Signed   By: Inge Rise M.D.   On: 12/27/2017 17:35    Assessment: 72 y.o. with past medical history of borderline resectable pancreatic cancer and prostate cancer in June 2018, CKD, coronary artery disease status post CABG, hypertension, presented to ED with 2 days of bilateral lower abdominal pain, nausea and vomiting.  1.  Obstructive jaundice, secondary to progression of his pancreatic cancer 2.  Borderline resectable pancreatic cancer, diagnosed in June 2018, status post brief chemo and radiation, lost f/u after 03/2017  3.  Acute on chronic renal failure 4.  Coronary artery disease status post CABG 5.  Prostate cancer, status post TURP and radiation    Plan:  -GI Dr. Cristina Gong has seen pt in the later afternoon, pt is scheduled for MRCP and stent placement by Dr. Estanislado Emms tomorrow, I spoke with Dr. Alycia Rossetti and appreciated his help  -I will touch based with Dr. Barry Dienes to see if surgery is still an option, may need abdominal MRI for more evaluation, unfortunately he can not have CT or MRI contrast due to his CKD -If surgery is not an option, then his goal of treatment is palliative. Given his multiple comorbidities, especially previous poor tolerance to chemotherapy, his chemo option is low very limited. -I will f/u when he is here.   Truitt Merle, MD 12/28/2017  5:25 PM

## 2017-12-28 NOTE — Progress Notes (Signed)
Dr. Cristina Gong in to speak to family and patient about the ERCP test that will be scheduled for 12/29/17. Handouts given to patient about ERCP. IV clotted, IV team contacted, they will be up after hanging TPN. Patient denies any pain.

## 2017-12-28 NOTE — Consult Note (Signed)
Referring Provider:  Irine Seal, MD (triad hospitalist) Primary Care Physician:  Patient, No Pcp Per Primary Gastroenterologist: None (unassigned; previously had inpatient EUS by Dr. Oretha Caprice) Reason for Consultation: Obstructive jaundice  HPI: [name] is a 72 y.o. male with a 1 year history of pancreatic cancer, status post chemoradiation therapy, who recently developed abdominal pain and came to the emergency room where it was noted that his liver chemistries had increased significantly, and he had new onset intrahepatic and extrahepatic biliary ductal dilatation by CT, in association with a mass effect in the head of the pancreas.    No fevers, no chills, the patient has not noticed any dark urine but was having diffuse abdominal pain.  That pain is now better.  Approximately 3 months ago, the patient's alkaline phosphatase was normal at 86, now it is approximately 500.  Total bilirubin has risen from 0.3 to approximately 6 during the same interval.  Transaminases have risen from normal to approximately 100.   Past Medical History:  Diagnosis Date  . Acute MI, inferior wall ([place]) 2011  . Amputation finger    right first finger top portion age 12  . Aortic atherosclerosis ([place])   . ARF (acute renal failure) ([place]) 01/24/2015  . CHF (congestive heart failure) ([place])   . CKD (chronic kidney disease), stage III ([place]) 11/2015  . Coronary artery disease cardioloigst-  dr Stanford Breed   hx inferior MI 10-08-2009 emergency CABG x4  . DOE (dyspnea on exertion)    since starting chemo  . DVT (deep venous thrombosis) ([place]) 11/05/2016   right proximal-mid peroneal veins  . Fracture of left hip ([place])   . H/O hyperkalemia   . History of acute inferior wall myocardial infarction 10/08/2009  . History of blood transfusion   . History of chemotherapy   . History of echocardiogram 03/2012   EF 45%, LAE, Mild MR, no residual VSD  . History of first degree heart block   . History of  metabolic acidosis   . History of thrombocytopenia   . History of urethral stricture   . History of UTI   . Hyperlipidemia   . Hypertension   . Ischemic cardiomyopathy   . Kidney cysts    bilateral  . Multiple pulmonary nodules    Bilateral scattered  . Multiple thyroid nodules   . Obstructed, uropathy   . Obstructive uropathy   . Pancreatic carcinoma (HCC)    s/p SBRT  . Pneumonia    Childhood  . Prostate cancer ([place])   . S/P VSD repair    09/2009  . Systolic CHF, chronic ([place])   . VSD (ventricular septal defect) 09/2009   acute after MI    Past Surgical History:  Procedure Laterality Date  . CARDIAC CATHETERIZATION    . CORONARY ARTERY BYPASS GRAFT     times 4  . CYSTOSCOPY W/ RETROGRADES N/A 04/03/2015   Procedure:  BLADDER BIOPSIES;  Surgeon: Festus Aloe, MD;  Location: [facility];  Service: Urology;  Laterality: N/A;  . CYSTOSCOPY W/ RETROGRADES Bilateral 06/27/2016   Procedure: CYSTOSCOPY WITH BILATERAL RETROGRADES;  Surgeon: Festus Aloe, MD;  Location: [facility];  Service: Urology;  Laterality: Bilateral;  . CYSTOSCOPY WITH BIOPSY N/A 06/27/2016   Procedure: CYSTOSCOPY WITH BLADDER BIOPSY URETHRAL BIOPSY;  Surgeon: Festus Aloe, MD;  Location: [facility];  Service: Urology;  Laterality: N/A;  . CYSTOSCOPY WITH URETHRAL DILATATION N/A 04/03/2015   Procedure: CYSTOSCOPY WITH BILATERAL RETROGRADES;  Surgeon: Festus Aloe, MD;  Location:  [facility];  Service: Urology;  Laterality: N/A;  . EUS N/A 08/28/2016   Procedure: UPPER ENDOSCOPIC ULTRASOUND (EUS) RADIAL;  Surgeon: Milus Banister, MD;  Location: [facility];  Service: Endoscopy;  Laterality: N/A;  . flexible cystoscopy    . GOLD SEED IMPLANT N/A 07/03/2017   Procedure: GOLD SEED IMPLANT;  Surgeon: Festus Aloe, MD;  Location: [facility];  Service: Urology;  Laterality: N/A;  Needs Ultrasound Tech  . repair of post infarction posterior ventricular septal defect  09/2009  . SPACE OAR  INSTILLATION N/A 07/03/2017   Procedure: SPACE OAR INSTILLATION;  Surgeon: Festus Aloe, MD;  Location[facility];  Service: Urology;  Laterality: N/A;  . TRANSURETHRAL RESECTION OF PROSTATE  06/27/2016   Procedure: TRANSURETHRAL RESECTION OF THE PROSTATE (TURP);  Surgeon: Festus Aloe, MD;  Location: [facility];  Service: Urology;;  . UPPER GI ENDOSCOPY      Prior to Admission medications   Medication Sig Start Date End Date Taking? Authorizing Provider  amLODipine (NORVASC) 10 MG tablet take 1/2 tablet by mouth once daily Patient taking differently: Take 5 mg by mouth daily.  03/05/17  Yes Lelon Perla, MD  carvedilol (COREG) 12.5 MG tablet Take 1 tablet (12.5 mg total) by mouth 2 (two) times daily with a meal. 11/13/17  Yes Crenshaw, Denice Bors, MD  rosuvastatin (CRESTOR) 40 MG tablet Take 1 tablet (40 mg total) by mouth daily. 11/13/17  Yes Lelon Perla, MD  tamsulosin (FLOMAX) 0.4 MG CAPS capsule Take 1 capsule (0.4 mg total) by mouth daily after supper. 10/23/17  Yes Bruning, Ashlyn, PA-C  aspirin EC 81 MG tablet Take 1 tablet (81 mg total) by mouth daily. 09/24/17   Lelon Perla, MD  cephALEXin (KEFLEX) 500 MG capsule Take 1 capsule (500 mg total) by mouth 4 (four) times daily. Patient not taking: Reported on 12/27/2017 10/12/17   Molpus, Jenny Reichmann, MD    Current Facility-Administered Medications  Medication Dose Route Frequency Provider Last Rate Last Dose  . aspirin EC tablet 81 mg  81 mg Oral Daily Lenore Cordia, MD   81 mg at 12/28/17 1610  . carvedilol (COREG) tablet 12.5 mg  12.5 mg Oral BID WC Zada Finders R, MD   12.5 mg at 12/28/17 1753  . dextrose 5 % 850 mL with sodium bicarbonate 150 mEq infusion   Intravenous Continuous Eugenie Filler, MD 100 mL/hr at 12/28/17 [id number]    . ondansetron (ZOFRAN) tablet 4 mg  4 mg Oral Q6H PRN Lenore Cordia, MD       Or  . ondansetron (ZOFRAN) injection 4 mg  4 mg Intravenous Q6H PRN Zada Finders R, MD      .  rosuvastatin (CRESTOR) tablet 40 mg  40 mg Oral Daily Lenore Cordia, MD   40 mg at 12/28/17 0953  . tamsulosin (FLOMAX) capsule 0.4 mg  0.4 mg Oral QPC supper Zada Finders R, MD   0.4 mg at 12/28/17 1752    Allergies as of 12/27/2017 - Review Complete 12/27/2017  Allergen Reaction Noted  . Chlorhexidine gluconate Other (See Comments) 03/27/2015    Family History  Problem Relation Age of Onset  . Cancer Paternal Aunt        unknown type cancer   . Cancer Paternal Uncle        unknown type cancer    Social History   Socioeconomic History  . Marital status: Divorced    Spouse name: Not on  file  . Number of children: Not on file  . Years of education: Not on file  . Highest education level: Not on file  Occupational History  . Not on file  Social Needs  . Financial resource strain: Not on file  . Food insecurity:    Worry: Not on file    Inability: Not on file  . Transportation needs:    Medical: Not on file    Non-medical: Not on file  Tobacco Use  . Smoking status: Former Smoker    Packs/day: 1.50    Years: 45.00    Pack years: 67.50    Types: Cigarettes    Last attempt to quit: 01/13/2010    Years since quitting: 7.9  . Smokeless tobacco: Never Used  Substance and Sexual Activity  . Alcohol use: No    Comment: occasional   . Drug use: No  . Sexual activity: Not on file  Lifestyle  . Physical activity:    Days per week: Not on file    Minutes per session: Not on file  . Stress: Not on file  Relationships  . Social connections:    Talks on phone: Not on file    Gets together: Not on file    Attends religious service: Not on file    Active member of club or organization: Not on file    Attends meetings of clubs or organizations: Not on file    Relationship status: Not on file  . Intimate partner violence:    Fear of current or ex partner: No    Emotionally abused: No    Physically abused: No    Forced sexual activity: No  Other Topics Concern  . Not on  file  Social History Narrative  . Not on file    Review of Systems: Denies heart or lung problems  Physical Exam: Vital signs in last 24 hours: Temp:  [97.7 F (36.5 C)-98.4 F (36.9 C)] 98.4 F (36.9 C) (12/16 1333) Pulse Rate:  [66-71] 70 (12/16 1753) Resp:  [14-16] 16 (12/16 1333) BP: (115-126)/(73-79) 120/73 (12/16 1753) SpO2:  [98 %-99 %] 99 % (12/16 1333) Weight:  [83.2 kg-84.6 kg] 84.6 kg (12/16 0423) Last BM Date: 12/27/17  This is a pleasant, fairly healthy-appearing non-cachectic African-American male lying in bed in no distress.  He does not have any overt jaundice.  Chest is clear, heart is without murmur or arrhythmia.  Abdomen has no mass-effect, organomegaly, or tenderness.  No peripheral edema, no evident focal neurologic deficits, normal cognitive function.  Intake/Output from previous day: 12/15 0701 - 12/16 0700 In: 395.5 [I.V.:395.5] Out: 900 [Urine:900] Intake/Output this shift: Total I/O In: 751 [P.O.:751] Out: 440 [Urine:440]  Lab Results: Recent Labs    12/27/17 1537 12/28/17 0506  WBC 4.3 4.8  HGB 8.4* 8.2*  HCT 26.8* 26.2*  PLT 268 268   BMET Recent Labs    12/27/17 1537 12/28/17 0506  NA 135 137  K 4.0 4.1  CL 110 112*  CO2 14* 14*  GLUCOSE 111* 101*  BUN 52* 50*  CREATININE 4.01* 4.11*  CALCIUM 9.5 9.4   LFT Recent Labs    12/28/17 0506  PROT 7.5  ALBUMIN 3.0*  AST 102*  ALT 136*  ALKPHOS 480*  BILITOT 5.6*   PT/INR No results for input(s): LABPROT, INR in the last 72 hours.  Studies/Results: Ct Abdomen Pelvis Wo Contrast  Result Date: 12/27/2017 CLINICAL DATA:  Low abdominal pain with bloating for 2 days. No appetite.  History of pancreatic cancer. Elevated liver function studies. EXAM: CT ABDOMEN AND PELVIS WITHOUT CONTRAST TECHNIQUE: Multidetector CT imaging of the abdomen and pelvis was performed following the standard protocol without IV contrast. COMPARISON:  Abdominopelvic CT 10/05/2017. FINDINGS: Lower chest:  Stable mild chronic central airway thickening in both lung bases. The heart is enlarged with a chronic calcified aneurysm involving inferior wall left ventricle. No significant pleural or pericardial effusion. Hepatobiliary: No focal hepatic lesions are identified on noncontrast imaging. However, there is new intrahepatic biliary dilatation with increased extrahepatic biliary dilatation. The gallbladder appears unremarkable. Pancreas: Ill-defined enlargement of the pancreatic head is grossly stable, suboptimally evaluated on this noncontrast study. There is a stable surgical clip or fiduciary marker at the pancreatic neck. There is stable pancreatic atrophy without significant ductal dilatation or surrounding inflammatory change. Spleen: Normal in size without focal abnormality. Adrenals/Urinary Tract: Both adrenal glands appear normal. The kidneys appears similar to the previous study. There is bilateral renal cortical thinning with low-density and hyperdense lesions bilaterally. There is mild chronic collecting system dilatation and wall thickening. There is moderate diffuse bladder wall thickening. Stomach/Bowel: No evidence of bowel wall thickening, distention or surrounding inflammatory change. The appendix appears normal. Vascular/Lymphatic: There is increased low-density in the porta hepatis, difficult to differentiate from the portal vein and bile duct on this noncontrast study, although suspicious for worsening portacaval adenopathy given the worsening biliary dilatation. This measures up to 16 mm short axis on image 22. There are no enlarged retroperitoneal or pelvic lymph nodes. Diffuse aortic and branch vessel atherosclerosis. Reproductive: Radiation clips in the prostate gland and mild enlargement of the gland are stable. Other: No ascites or peritoneal nodularity. Musculoskeletal: No acute or significant osseous findings. Chronic biconcave deformities in the lower lumbar spine are stable. No evidence  of osseous metastatic disease. Stable lipomas in the external oblique muscles of the abdominal wall bilaterally. IMPRESSION: 1. New intrahepatic and progressive extrahepatic biliary dilatation consistent with obstructive jaundice from local recurrence of pancreatic cancer or nodal adenopathy in the porta hepatis. Biliary decompression recommended. 2. No hepatic metastases identified on noncontrast imaging. No ascites or peritoneal nodularity. 3. Stable chronic mild renal collecting system dilatation and wall thickening without secondary signs of acute obstruction. Chronic bladder wall thickening. 4. Chronic calcified left ventricular aneurysm. Electronically Signed   By: Richardean Sale M.D.   On: 12/27/2017 18:00   US Renal  Result Date: 12/28/2017 CLINICAL DATA:  Acute renal insufficiency. EXAM: RENAL / URINARY TRACT ULTRASOUND COMPLETE COMPARISON:  None. FINDINGS: Right Kidney: Renal measurements: 10.6 x 5.7 x 4.7 cm = volume: 135.2 mL . Echogenicity within normal limits. No mass or hydronephrosis visualized. Left Kidney: Renal measurements: 8.5 x 4.8 x 3.2 cm = volume: 68.4 mL. Contains 2 cysts with the largest measuring 1.7 cm. Bladder: Appears normal for degree of bladder distention. IMPRESSION: 1. No cause for acute renal failure identified.  Left renal cysts. Electronically Signed   By: Dorise Bullion III M.D   On: 12/28/2017 10:53   US Abdomen Limited Ruq  Result Date: 12/27/2017 CLINICAL DATA:  Right upper quadrant pain for 3 days. EXAM: ULTRASOUND ABDOMEN LIMITED RIGHT UPPER QUADRANT COMPARISON:  None. FINDINGS: Gallbladder: No gallstones or wall thickening visualized. No sonographic Murphy sign noted by sonographer. Common bile duct: Diameter: 0.8 cm. Liver: No focal lesion identified. Within normal limits in parenchymal echogenicity. Portal vein is patent on color Doppler imaging with normal direction of blood flow towards the liver. IMPRESSION: Negative exam. Electronically Signed  By:  Inge Rise M.D.   On: 12/27/2017 17:35    Impression: Obstructive jaundice, new onset, presumably due to progression of known pancreatic tumor.  Plan: ERCP with probable sphincterotomy and stent placement tomorrow.  The nature, purpose, and risks of the procedure (including pancreatitis which can rarely be fatal, anesthesia problems, bleeding, perforation, and infection) were reviewed with the patient and several family members at the bedside.  He is agreeable to proceed.  He is aware that it will be done by my partner, Dr. Watt Climes.  Preprocedural orders have been written.   LOS: 1 day   [place]  12/28/2017, 6:20 PM   Pager [phone] If no answer or after 5 PM call [phone]

## 2017-12-28 NOTE — Progress Notes (Signed)
PROGRESS NOTE    [name]  QTM:[id number] DOB: [birth year] DOA: 12/27/2017 PCP: Patient, No Pcp Per    Brief Narrative:  Patient is a 72 year old gentleman history of pancreatic cancer status post SBRT, prostate cancer status post TURP and XRT, coronary artery disease status post CABG, chronic kidney disease stage IV, VSD status post repair, hypertension hyperlipidemia presented to the ED with 2-day history of bilateral lower abdominal pain with nausea and emesis.  Patient unable to maintain oral intake with associated lightheadedness.  Work-up in the ED with a creatinine of 4.01 with prior creatinine of 3.38.  CBC with a hemoglobin of 8.4, WBC of 4.3.  Right upper quadrant ultrasound done was negative for gallstones or cholecystitis, common bile duct diameter 0.8 cm no acute finding.  CT abdomen and pelvis which was done showed new intrahepatic and progressive extrahepatic biliary dilatation consistent with obstructive jaundice.  Patient hospitalized for further evaluation and management.   Assessment & Plan:   Principal Problem:   Obstructive jaundice due to cancer [facility]) Active Problems:   HYPERTENSION, BENIGN   Coronary atherosclerosis of native coronary artery   Hyperlipidemia   Malignant neoplasm of prostate (HCC)   Pancreatic cancer (HCC)   CKD (chronic kidney disease), stage IV (HCC)   ARF (acute renal failure) (HCC)  1 obstructive jaundice in the setting of history of pancreatic cancer Patient with history of pancreatic cancer status post SBRT.  Patient being followed by Dr. Burr Medico of oncology as well as Dr. Tammi Klippel of radiation oncology.  Patient just recently completed radiation treatment.  CT abdomen and pelvis concerning for new intrahepatic and progressive extrahepatic biliary dilatation consistent with obstructive jaundice/local recurrence of pancreatic cancer.  GI has been consulted and currently awaiting evaluation.  We will keep patient n.p.o. until patient has been seen  and assessed by GI.  Continue IV fluids, supportive care.  2.  Acute renal failure on chronic kidney disease stage IV Creatinine noted to be 4.01 on admission compared to prior creatinine of 3.38 on 10/05/2017.  Likely secondary to a prerenal azotemia.  Urinalysis which was done had large leukocytes, nitrite negative, moderate hemoglobin, greater than 50 WBCs.  Check a urine sodium and a urine creatinine.  Check a renal ultrasound.  Continue IV fluids.  Follow.  3.  Metabolic acidosis Likely secondary to problem #2.  Placed on a bicarb drip.  Follow.  4.  Hypertension Blood pressure noted to be borderline on admission.  Continue IV fluids. Continue Coreg and Flomax.  5.  Coronary artery disease status post CABG Stable.  Continue home regimen aspirin, Crestor, Coreg.  6.  Hyperlipidemia Continue statin.  7.  History of prostate cancer Status post TURP and XRT.  Continue Flomax.   DVT prophylaxis: SCDs Code Status: Full Family Communication: Updated patient.  No family at bedside. Disposition Plan: To be determined.   Consultants:   Gastroenterology pending  Procedures:   CT abdomen and pelvis 12/27/2017  Renal ultrasound 12/28/2017  Right upper quadrant ultrasound 12/27/2017    Antimicrobials:   None   Subjective: Patient is laying in bed.  Denies any chest pain or shortness of breath.  Patient denies any further episodes of emesis.  No nausea.  Patient states abdominal pain has improved.  Asking when he can eat.  Objective: Vitals:   12/28/17 0423 12/28/17 0806 12/28/17 1333 12/28/17 1753  BP: 124/79 126/75 122/74 120/73  Pulse: 68 71 71 70  Resp: 16  16   Temp: 98.4 F (36.9 C)  98.4 F (36.9 C)   TempSrc: Oral  Oral   SpO2: 99%  99%   Weight: 84.6 kg     Height:        Intake/Output Summary (Last 24 hours) at 12/28/2017 1837 Last data filed at 12/28/2017 1800 Gross per 24 hour  Intake 1146.49 ml  Output 1340 ml  Net -193.51 ml   Filed Weights    12/27/17 1940 12/28/17 0423  Weight: 83.2 kg 84.6 kg    Examination:  General exam: Appears calm and comfortable  Respiratory system: Clear to auscultation. Respiratory effort normal. Cardiovascular system: S1 & S2 heard, RRR. No JVD, murmurs, rubs, gallops or clicks. No pedal edema. Gastrointestinal system: Abdomen is nondistended, soft and nontender. No organomegaly or masses felt. Normal bowel sounds heard. Central nervous system: Alert and oriented. No focal neurological deficits. Extremities: Symmetric 5 x 5 power. Skin: No rashes, lesions or ulcers Psychiatry: Judgement and insight appear normal. Mood & affect appropriate.     Data Reviewed: I have personally reviewed following labs and imaging studies  CBC: Recent Labs  Lab 12/27/17 1537 12/28/17 0506  WBC 4.3 4.8  HGB 8.4* 8.2*  HCT 26.8* 26.2*  MCV 86.2 87.0  PLT 268 017   Basic Metabolic Panel: Recent Labs  Lab 12/27/17 1537 12/28/17 0506  NA 135 137  K 4.0 4.1  CL 110 112*  CO2 14* 14*  GLUCOSE 111* 101*  BUN 52* 50*  CREATININE 4.01* 4.11*  CALCIUM 9.5 9.4   GFR: Estimated Creatinine Clearance: 15.5 mL/min (A) (by C-G formula based on SCr of 4.11 mg/dL (H)). Liver Function Tests: Recent Labs  Lab 12/27/17 1537 12/28/17 0506  AST 111* 102*  ALT 148* 136*  ALKPHOS 520* 480*  BILITOT 6.2* 5.6*  PROT 7.8 7.5  ALBUMIN 3.3* 3.0*   Recent Labs  Lab 12/27/17 1537  LIPASE 22   No results for input(s): AMMONIA in the last 168 hours. Coagulation Profile: No results for input(s): INR, PROTIME in the last 168 hours. Cardiac Enzymes: No results for input(s): CKTOTAL, CKMB, CKMBINDEX, TROPONINI in the last 168 hours. BNP (last 3 results) No results for input(s): PROBNP in the last 8760 hours. HbA1C: No results for input(s): HGBA1C in the last 72 hours. CBG: No results for input(s): GLUCAP in the last 168 hours. Lipid Profile: No results for input(s): CHOL, HDL, LDLCALC, TRIG, CHOLHDL, LDLDIRECT  in the last 72 hours. Thyroid Function Tests: No results for input(s): TSH, T4TOTAL, FREET4, T3FREE, THYROIDAB in the last 72 hours. Anemia Panel: No results for input(s): VITAMINB12, FOLATE, FERRITIN, TIBC, IRON, RETICCTPCT in the last 72 hours. Sepsis Labs: No results for input(s): PROCALCITON, LATICACIDVEN in the last 168 hours.  No results found for this or any previous visit (from the past 240 hour(s)).       Radiology Studies: Ct Abdomen Pelvis Wo Contrast  Result Date: 12/27/2017 CLINICAL DATA:  Low abdominal pain with bloating for 2 days. No appetite. History of pancreatic cancer. Elevated liver function studies. EXAM: CT ABDOMEN AND PELVIS WITHOUT CONTRAST TECHNIQUE: Multidetector CT imaging of the abdomen and pelvis was performed following the standard protocol without IV contrast. COMPARISON:  Abdominopelvic CT 10/05/2017. FINDINGS: Lower chest: Stable mild chronic central airway thickening in both lung bases. The heart is enlarged with a chronic calcified aneurysm involving inferior wall left ventricle. No significant pleural or pericardial effusion. Hepatobiliary: No focal hepatic lesions are identified on noncontrast imaging. However, there is new intrahepatic biliary dilatation with increased extrahepatic biliary dilatation.  The gallbladder appears unremarkable. Pancreas: Ill-defined enlargement of the pancreatic head is grossly stable, suboptimally evaluated on this noncontrast study. There is a stable surgical clip or fiduciary marker at the pancreatic neck. There is stable pancreatic atrophy without significant ductal dilatation or surrounding inflammatory change. Spleen: Normal in size without focal abnormality. Adrenals/Urinary Tract: Both adrenal glands appear normal. The kidneys appears similar to the previous study. There is bilateral renal cortical thinning with low-density and hyperdense lesions bilaterally. There is mild chronic collecting system dilatation and wall  thickening. There is moderate diffuse bladder wall thickening. Stomach/Bowel: No evidence of bowel wall thickening, distention or surrounding inflammatory change. The appendix appears normal. Vascular/Lymphatic: There is increased low-density in the porta hepatis, difficult to differentiate from the portal vein and bile duct on this noncontrast study, although suspicious for worsening portacaval adenopathy given the worsening biliary dilatation. This measures up to 16 mm short axis on image 22. There are no enlarged retroperitoneal or pelvic lymph nodes. Diffuse aortic and branch vessel atherosclerosis. Reproductive: Radiation clips in the prostate gland and mild enlargement of the gland are stable. Other: No ascites or peritoneal nodularity. Musculoskeletal: No acute or significant osseous findings. Chronic biconcave deformities in the lower lumbar spine are stable. No evidence of osseous metastatic disease. Stable lipomas in the external oblique muscles of the abdominal wall bilaterally. IMPRESSION: 1. New intrahepatic and progressive extrahepatic biliary dilatation consistent with obstructive jaundice from local recurrence of pancreatic cancer or nodal adenopathy in the porta hepatis. Biliary decompression recommended. 2. No hepatic metastases identified on noncontrast imaging. No ascites or peritoneal nodularity. 3. Stable chronic mild renal collecting system dilatation and wall thickening without secondary signs of acute obstruction. Chronic bladder wall thickening. 4. Chronic calcified left ventricular aneurysm. Electronically Signed   By: Richardean Sale M.D.   On: 12/27/2017 18:00   US Renal  Result Date: 12/28/2017 CLINICAL DATA:  Acute renal insufficiency. EXAM: RENAL / URINARY TRACT ULTRASOUND COMPLETE COMPARISON:  None. FINDINGS: Right Kidney: Renal measurements: 10.6 x 5.7 x 4.7 cm = volume: 135.2 mL . Echogenicity within normal limits. No mass or hydronephrosis visualized. Left Kidney: Renal  measurements: 8.5 x 4.8 x 3.2 cm = volume: 68.4 mL. Contains 2 cysts with the largest measuring 1.7 cm. Bladder: Appears normal for degree of bladder distention. IMPRESSION: 1. No cause for acute renal failure identified.  Left renal cysts. Electronically Signed   By: Dorise Bullion III M.D   On: 12/28/2017 10:53   US Abdomen Limited Ruq  Result Date: 12/27/2017 CLINICAL DATA:  Right upper quadrant pain for 3 days. EXAM: ULTRASOUND ABDOMEN LIMITED RIGHT UPPER QUADRANT COMPARISON:  None. FINDINGS: Gallbladder: No gallstones or wall thickening visualized. No sonographic Murphy sign noted by sonographer. Common bile duct: Diameter: 0.8 cm. Liver: No focal lesion identified. Within normal limits in parenchymal echogenicity. Portal vein is patent on color Doppler imaging with normal direction of blood flow towards the liver. IMPRESSION: Negative exam. Electronically Signed   By: Inge Rise M.D.   On: 12/27/2017 17:35        Scheduled Meds: . aspirin EC  81 mg Oral Daily  . carvedilol  12.5 mg Oral BID WC  . rosuvastatin  40 mg Oral Daily  . tamsulosin  0.4 mg Oral QPC supper   Continuous Infusions: . dextrose 5 % 850 mL with sodium bicarbonate 150 mEq infusion 100 mL/hr at 12/28/17 0951     LOS: 1 day    Time spent: 35 minutes    Quillian Quince  Grandville Silos, MD [facility] Pager [phone]  If 7PM-7AM, please contact night-coverage [email or web address] Password [facility] 12/28/2017, 6:37 PM

## 2017-12-28 NOTE — Progress Notes (Signed)
Patient states he hasn't had an appetite for the last few days.

## 2017-12-29 ENCOUNTER — Encounter (HOSPITAL_COMMUNITY)
Admission: EM | Disposition: A | Discharge status: Home Health | Payer: Self-pay | Source: Home / Self Care | Attending: Family Medicine

## 2017-12-29 ENCOUNTER — Encounter (HOSPITAL_COMMUNITY): Payer: Self-pay | Admitting: Emergency Medicine

## 2017-12-29 ENCOUNTER — Inpatient Hospital Stay (HOSPITAL_COMMUNITY): Payer: Medicare Other | Admitting: Certified Registered Nurse Anesthetist

## 2017-12-29 ENCOUNTER — Inpatient Hospital Stay (HOSPITAL_COMMUNITY): Payer: Medicare Other

## 2017-12-29 HISTORY — PX: ENDOSCOPIC RETROGRADE CHOLANGIOPANCREATOGRAPHY (ERCP) WITH PROPOFOL: SHX5810

## 2017-12-29 HISTORY — PX: SPHINCTEROTOMY: SHX5544

## 2017-12-29 HISTORY — PX: BILIARY STENT PLACEMENT: SHX5538

## 2017-12-29 HISTORY — PX: REMOVAL OF STONES: SHX5545

## 2017-12-29 LAB — FERRITIN: Ferritin: 450 ng/mL — ABNORMAL HIGH (ref 24–336)

## 2017-12-29 LAB — COMPREHENSIVE METABOLIC PANEL
ALT: 145 U/L — ABNORMAL HIGH (ref 0–44)
AST: 129 U/L — ABNORMAL HIGH (ref 15–41)
Albumin: 2.8 g/dL — ABNORMAL LOW (ref 3.5–5.0)
Alkaline Phosphatase: 512 U/L — ABNORMAL HIGH (ref 38–126)
Anion gap: 9 (ref 5–15)
BILIRUBIN TOTAL: 5.2 mg/dL — AB (ref 0.3–1.2)
BUN: 45 mg/dL — ABNORMAL HIGH (ref 8–23)
CO2: 17 mmol/L — ABNORMAL LOW (ref 22–32)
Calcium: 9.1 mg/dL (ref 8.9–10.3)
Chloride: 112 mmol/L — ABNORMAL HIGH (ref 98–111)
Creatinine, Ser: 4.27 mg/dL — ABNORMAL HIGH (ref 0.61–1.24)
GFR calc Af Amer: 15 mL/min — ABNORMAL LOW (ref >60)
GFR calc non Af Amer: 13 mL/min — ABNORMAL LOW (ref >60)
GLUCOSE: 138 mg/dL — AB (ref 70–99)
Potassium: 3.8 mmol/L (ref 3.5–5.1)
Sodium: 138 mmol/L (ref 135–145)
Total Protein: 7.2 g/dL (ref 6.5–8.1)

## 2017-12-29 LAB — PROTIME-INR
INR: 1.21
Prothrombin Time: 15.2 seconds (ref 11.4–15.2)

## 2017-12-29 LAB — IRON AND TIBC
Iron: 26 ug/dL — ABNORMAL LOW (ref 45–182)
SATURATION RATIOS: 10 % — AB (ref 17.9–39.5)
TIBC: 250 ug/dL (ref 250–450)
UIBC: 224 ug/dL

## 2017-12-29 LAB — CBC
HCT: 25.8 % — ABNORMAL LOW (ref 39.0–52.0)
Hemoglobin: 8.1 g/dL — ABNORMAL LOW (ref 13.0–17.0)
MCH: 26.5 pg (ref 26.0–34.0)
MCHC: 31.4 g/dL (ref 30.0–36.0)
MCV: 84.3 fL (ref 80.0–100.0)
Platelets: 249 10*3/uL (ref 150–400)
RBC: 3.06 MIL/uL — ABNORMAL LOW (ref 4.22–5.81)
RDW: 17.8 % — AB (ref 11.5–15.5)
WBC: 4.3 10*3/uL (ref 4.0–10.5)
nRBC: 0 % (ref 0.0–0.2)

## 2017-12-29 LAB — VITAMIN B12: VITAMIN B 12: 1093 pg/mL — AB (ref 180–914)

## 2017-12-29 LAB — TYPE AND SCREEN
ABO/RH(D): O POS
Antibody Screen: NEGATIVE

## 2017-12-29 LAB — ABO/RH: ABO/RH(D): O POS

## 2017-12-29 LAB — FOLATE: Folate: 8.3 ng/mL (ref >5.9)

## 2017-12-29 SURGERY — ENDOSCOPIC RETROGRADE CHOLANGIOPANCREATOGRAPHY (ERCP) WITH PROPOFOL
Anesthesia: General

## 2017-12-29 MED ORDER — SUGAMMADEX SODIUM 200 MG/2ML IV SOLN
INTRAVENOUS | Status: DC | PRN
  Administered 2017-12-29: 300 mg via INTRAVENOUS

## 2017-12-29 MED ORDER — LACTATED RINGERS IV SOLN
INTRAVENOUS | Status: DC | PRN
  Administered 2017-12-29: 11:00:00 via INTRAVENOUS

## 2017-12-29 MED ORDER — SODIUM CHLORIDE 0.9 % IV SOLN: INTRAVENOUS | Status: DC

## 2017-12-29 MED ORDER — PROPOFOL 10 MG/ML IV BOLUS
INTRAVENOUS | Status: AC
  Filled 2017-12-29: qty 20

## 2017-12-29 MED ORDER — DEXAMETHASONE SODIUM PHOSPHATE 10 MG/ML IJ SOLN
INTRAMUSCULAR | Status: DC | PRN
  Administered 2017-12-29: 8 mg via INTRAVENOUS

## 2017-12-29 MED ORDER — SODIUM BICARBONATE 650 MG PO TABS
650.0000 mg | ORAL_TABLET | Freq: Two times a day (BID) | ORAL | Status: DC
  Administered 2017-12-29 – 2018-01-01 (×6): 650 mg via ORAL
  Filled 2017-12-29 (×6): qty 1

## 2017-12-29 MED ORDER — GLUCAGON HCL RDNA (DIAGNOSTIC) 1 MG IJ SOLR
INTRAMUSCULAR | Status: AC
  Filled 2017-12-29: qty 1

## 2017-12-29 MED ORDER — ONDANSETRON HCL 4 MG/2ML IJ SOLN
INTRAMUSCULAR | Status: DC | PRN
  Administered 2017-12-29: 4 mg via INTRAVENOUS

## 2017-12-29 MED ORDER — IOPAMIDOL (ISOVUE-M 300) INJECTION 61%
INTRAMUSCULAR | Status: DC | PRN
  Administered 2017-12-29: 25 mL via INTRATHECAL

## 2017-12-29 MED ORDER — PHENYLEPHRINE HCL 10 MG/ML IJ SOLN
INTRAVENOUS | Status: DC | PRN
  Administered 2017-12-29: 35 ug/min via INTRAVENOUS

## 2017-12-29 MED ORDER — ROCURONIUM BROMIDE 50 MG/5ML IV SOSY
PREFILLED_SYRINGE | INTRAVENOUS | Status: DC | PRN
  Administered 2017-12-29: 50 mg via INTRAVENOUS

## 2017-12-29 MED ORDER — EPHEDRINE SULFATE-NACL 50-0.9 MG/10ML-% IV SOSY
PREFILLED_SYRINGE | INTRAVENOUS | Status: DC | PRN
  Administered 2017-12-29 (×2): 10 mg via INTRAVENOUS

## 2017-12-29 MED ORDER — INDOMETHACIN 50 MG RE SUPP
RECTAL | Status: AC
  Filled 2017-12-29: qty 2

## 2017-12-29 MED ORDER — CIPROFLOXACIN IN D5W 400 MG/200ML IV SOLN
INTRAVENOUS | Status: AC
  Filled 2017-12-29: qty 200

## 2017-12-29 MED ORDER — FENTANYL CITRATE (PF) 100 MCG/2ML IJ SOLN
INTRAMUSCULAR | Status: DC | PRN
  Administered 2017-12-29: 25 ug via INTRAVENOUS

## 2017-12-29 MED ORDER — LIDOCAINE 2% (20 MG/ML) 5 ML SYRINGE
INTRAMUSCULAR | Status: DC | PRN
  Administered 2017-12-29: 60 mg via INTRAVENOUS

## 2017-12-29 MED ORDER — FENTANYL CITRATE (PF) 100 MCG/2ML IJ SOLN
INTRAMUSCULAR | Status: AC
  Filled 2017-12-29: qty 2

## 2017-12-29 MED ORDER — CIPROFLOXACIN IN D5W 400 MG/200ML IV SOLN
INTRAVENOUS | Status: DC | PRN
  Administered 2017-12-29: 400 mg via INTRAVENOUS

## 2017-12-29 MED ORDER — PROPOFOL 10 MG/ML IV BOLUS
INTRAVENOUS | Status: DC | PRN
  Administered 2017-12-29: 100 mg via INTRAVENOUS

## 2017-12-29 NOTE — Anesthesia Preprocedure Evaluation (Signed)
Anesthesia Evaluation  Patient identified by MRN, date of birth, ID band Patient awake    Reviewed: Allergy & Precautions, H&P , NPO status , Patient's Chart, lab work & pertinent test results  Airway Mallampati: II   Neck ROM: full    Dental   Pulmonary former smoker,    breath sounds clear to auscultation       Cardiovascular hypertension, + CAD, + Past MI, + CABG, +CHF and + DOE   Rhythm:regular Rate:Normal     Neuro/Psych    GI/Hepatic Elevated LFTs   Endo/Other    Renal/GU Renal InsufficiencyRenal disease     Musculoskeletal   Abdominal   Peds  Hematology  (+) Blood dyscrasia, anemia ,   Anesthesia Other Findings   Reproductive/Obstetrics                             Anesthesia Physical Anesthesia Plan  ASA: III  Anesthesia Plan: General   Post-op Pain Management:    Induction: Intravenous  PONV Risk Score and Plan: 2 and Ondansetron, Dexamethasone, Midazolam and Treatment may vary due to age or medical condition  Airway Management Planned: Oral ETT  Additional Equipment:   Intra-op Plan:   Post-operative Plan: Extubation in OR  Informed Consent: I have reviewed the patients History and Physical, chart, labs and discussed the procedure including the risks, benefits and alternatives for the proposed anesthesia with the patient or authorized representative who has indicated his/her understanding and acceptance.     Plan Discussed with: CRNA, Anesthesiologist and Surgeon  Anesthesia Plan Comments:         Anesthesia Quick Evaluation

## 2017-12-29 NOTE — Op Note (Signed)
[facility] Patient Name: [name] Procedure Date: 12/29/2017 MRN: [id number] Attending MD: Clarene Essex , MD Date of Birth: 1945/10/18 CSN: [id number] Age: 72 Admit Type: Inpatient Procedure:                ERCP Indications:              Biliary dilation on Computed Tomogram Scan,                            Jaundice, Malignant tumor of the head of pancreas Providers:                Clarene Essex, MD, Burtis Junes, RN, Elspeth Cho                            Tech., Technician, Virgia Land, CRNA Referring MD:              Medicines:                General Anesthesia Complications:            No immediate complications. Estimated Blood Loss:     Estimated blood loss: none. Procedure:                Pre-Anesthesia Assessment:                           - Prior to the procedure, a History and Physical                            was performed, and patient medications and                            allergies were reviewed. The patient's tolerance of                            previous anesthesia was also reviewed. The risks                            and benefits of the procedure and the sedation                            options and risks were discussed with the patient.                            All questions were answered, and informed consent                            was obtained. Prior Anticoagulants: The patient has                            taken no previous anticoagulant or antiplatelet                            agents. ASA Grade Assessment: III - A patient with  severe systemic disease. After reviewing the risks                            and benefits, the patient was deemed in                            satisfactory condition to undergo the procedure.                           After obtaining informed consent, the scope was                            passed under direct vision. Throughout the                            procedure, the  patient's blood pressure, pulse, and                            oxygen saturations were monitored continuously. The                            [id number]-[id number] ([id number]) Olympus ERCP was introduced                            through the mouth, and used to inject contrast into                            and used to cannulate the bile duct. The ERCP was                            accomplished without difficulty. The patient                            tolerated the procedure well. Scope In: Scope Out: Findings:      The major papilla was congested. Deep selective cannulation was readily       obtained and there was no pancreatic duct injection or wire advancement       and an obvious short mid duct stricture was seen and we proceeded with a       biliary sphincterotomy was made with a Hydratome sphincterotome using       ERBE electrocautery. We did a small to medium size sphincterotomy until       we had adequate biliary drainage and there was no post-sphincterotomy       bleeding. The biliary tree was swept with an adjustable 9- 12 mm balloon       starting at the bifurcation. Nothing was found. We did this to better       delineate the stricture with an occlusion cholangiogram technique which       confirmed the mid duct stricture and the balloon was removed and we       placed one 10 Fr by 6 cm metal uncovered metal stent was placed 5.5 cm       into the common bile duct. Bile flowed through the stent. The stent was       in good  position. The introducer and wire were removed and the scope       removed and the patient tolerated the procedure well Impression:               - The major papilla appeared congested.                           - A biliary sphincterotomy was performed.                           - The biliary tree was swept and nothing was found.                           - One metal uncovered metal stent was placed into                            the common bile duct. Moderate  Sedation:      Not Applicable - Patient had care per Anesthesia. Recommendation:           - Clear liquid diet for 6 hours.                           - Continue present medications.                           - Return to GI clinic PRN.                           - Telephone GI clinic if symptomatic PRN.                           - Check liver enzymes (AST, ALT, alkaline                            phosphatase, bilirubin) in the morning. Procedure Code(s):        --- Professional ---                           [phone], Endoscopic retrograde                            cholangiopancreatography (ERCP); with placement of                            endoscopic stent into biliary or pancreatic duct,                            including pre- and post-dilation and guide wire                            passage, when performed, including sphincterotomy,                            when performed, each stent Diagnosis Code(s):        --- Professional ---  K83.8, Other specified diseases of biliary tract                           R17, Unspecified jaundice                           C25.0, Malignant neoplasm of head of pancreas CPT copyright 2018 [employer]. All rights reserved. The codes documented in this report are preliminary and upon coder review may  be revised to meet current compliance requirements. Clarene Essex, MD 12/29/2017 12:35:53 PM This report has been signed electronically. Number of Addenda: 0

## 2017-12-29 NOTE — Progress Notes (Addendum)
PROGRESS NOTE    [name]  WUX:[id number] DOB: 1945/02/09 DOA: 12/27/2017 PCP: Patient, No Pcp Per    Brief Narrative:  Patient is a 72 year old gentleman history of pancreatic cancer status post SBRT, prostate cancer status post TURP and XRT, coronary artery disease status post CABG, chronic kidney disease stage IV, VSD status post repair, hypertension hyperlipidemia presented to the ED with 2-day history of bilateral lower abdominal pain with nausea and emesis.  Patient unable to maintain oral intake with associated lightheadedness.  Work-up in the ED with a creatinine of 4.01 with prior creatinine of 3.38.  CBC with a hemoglobin of 8.4, WBC of 4.3.  Right upper quadrant ultrasound done was negative for gallstones or cholecystitis, common bile duct diameter 0.8 cm no acute finding.  CT abdomen and pelvis which was done showed new intrahepatic and progressive extrahepatic biliary dilatation consistent with obstructive jaundice.  Patient hospitalized for further evaluation and management.   Assessment & Plan:   Principal Problem:   Obstructive jaundice due to cancer [facility]) Active Problems:   HYPERTENSION, BENIGN   Coronary atherosclerosis of native coronary artery   Hyperlipidemia   Malignant neoplasm of prostate (HCC)   Pancreatic cancer (HCC)   CKD (chronic kidney disease), stage IV (HCC)   ARF (acute renal failure) (HCC)  1 obstructive jaundice in the setting of history of pancreatic cancer Patient with history of pancreatic cancer status post SBRT.  Patient being followed by Dr. Burr Medico of oncology as well as Dr. Tammi Klippel of radiation oncology.  Patient just recently completed radiation treatment.  CT abdomen and pelvis concerning for new intrahepatic and progressive extrahepatic biliary dilatation consistent with obstructive jaundice/local recurrence of pancreatic cancer.  GI has been consulted and patient underwent ERCP with endoscopic stent placement in the biliary or pancreatic duct  without any complications.  Patient on clear liquids for 6 hours and then soft diet this evening per GI.  Oncology following and I appreciate the input and recommendations.   2.  Acute renal failure on chronic kidney disease stage IV versus progressive chronic kidney disease. Creatinine noted to be 4.01 on admission compared to prior creatinine of 3.38 on 10/05/2017.  Initially felt likely secondary to a prerenal azotemia. Urinalysis which was done had large leukocytes, nitrite negative, moderate hemoglobin, greater than 50 WBCs. Urine sodium 46, urine creatinine 63.44.  Renal ultrasound negative for hydronephrosis.  Left renal cyst.  Creatinine worsening currently at 4.27.  Patient on bicarb drip.  Due to worsening renal function will consult with nephrology for further evaluation and management.  3.  Metabolic acidosis Likely secondary to problem #2.  Acidosis improving on bicarb drip.  Discontinue bicarb drip and placed on oral bicarb tablets twice daily.  Follow.  4.  Hypertension Blood pressure noted to be borderline on admission.  Blood pressure has improved.  Continue Coreg and Flomax.   5.  Coronary artery disease status post CABG Asymptomatic.  Continue aspirin, Crestor, Coreg.    6.  Hyperlipidemia Continue statin.  7.  History of prostate cancer Status post TURP and XRT.  Continue Flomax.  Outpatient follow-up.   DVT prophylaxis: SCDs Code Status: Full Family Communication: Updated patient.  No family at bedside. Disposition Plan: To be determined.   Consultants:   Gastroenterology: Dr. Cristina Gong 12/28/2017  Oncology: Dr. Burr Medico 12/28/2017  Procedures:   CT abdomen and pelvis 12/27/2017  Renal ultrasound 12/28/2017  Right upper quadrant ultrasound 12/27/2017  ERCP with endoscopic stent placement into the biliary or pancreatic duct by  Dr. Watt Climes 12/29/2017  Antimicrobials:   None   Subjective: Patient in endoscopy suite after procedure.  Denies any chest pain no  shortness of breath.  States abdominal pain has improved.  Denies any further nausea or emesis.  Objective: Vitals:   12/29/17 0141 12/29/17 0603 12/29/17 1103 12/29/17 1244  BP:  119/69 (!) 145/78 135/62  Pulse:  69 65 64  Resp:  18  20  Temp: 99.9 F (37.7 C) 100.2 F (37.9 C) 99.2 F (37.3 C) 99.3 F (37.4 C)  TempSrc: Oral Oral Oral Oral  SpO2:  98% 97% 100%  Weight:  87.7 kg    Height:        Intake/Output Summary (Last 24 hours) at 12/29/2017 1314 Last data filed at 12/29/2017 1246 Gross per 24 hour  Intake 1681 ml  Output 1250 ml  Net 431 ml   Filed Weights   12/27/17 1940 12/28/17 0423 12/29/17 0603  Weight: 83.2 kg 84.6 kg 87.7 kg    Examination:  General exam: NAD Respiratory system: Lungs clear to auscultation bilaterally.  No wheezes, no crackles, no rhonchi.   Cardiovascular system: Regular rate rhythm no murmurs rubs or gallops.  No JVD.  No lower extremity edema.  Gastrointestinal system: Abdomen is soft, nontender, nondistended, positive bowel sounds.  No rebound.  No guarding.  Central nervous system: Alert and oriented. No focal neurological deficits. Extremities: Symmetric 5 x 5 power. Skin: No rashes, lesions or ulcers Psychiatry: Judgement and insight appear normal. Mood & affect appropriate.     Data Reviewed: I have personally reviewed following labs and imaging studies  CBC: Recent Labs  Lab 12/27/17 1537 12/28/17 0506 12/29/17 0601  WBC 4.3 4.8 4.3  HGB 8.4* 8.2* 8.1*  HCT 26.8* 26.2* 25.8*  MCV 86.2 87.0 84.3  PLT 268 268 237   Basic Metabolic Panel: Recent Labs  Lab 12/27/17 1537 12/28/17 0506 12/29/17 0601  NA 135 137 138  K 4.0 4.1 3.8  CL 110 112* 112*  CO2 14* 14* 17*  GLUCOSE 111* 101* 138*  BUN 52* 50* 45*  CREATININE 4.01* 4.11* 4.27*  CALCIUM 9.5 9.4 9.1   GFR: Estimated Creatinine Clearance: 15.2 mL/min (A) (by C-G formula based on SCr of 4.27 mg/dL (H)). Liver Function Tests: Recent Labs  Lab  12/27/17 1537 12/28/17 0506 12/29/17 0601  AST 111* 102* 129*  ALT 148* 136* 145*  ALKPHOS 520* 480* 512*  BILITOT 6.2* 5.6* 5.2*  PROT 7.8 7.5 7.2  ALBUMIN 3.3* 3.0* 2.8*   Recent Labs  Lab 12/27/17 1537  LIPASE 22   No results for input(s): AMMONIA in the last 168 hours. Coagulation Profile: Recent Labs  Lab 12/29/17 0601  INR 1.21   Cardiac Enzymes: No results for input(s): CKTOTAL, CKMB, CKMBINDEX, TROPONINI in the last 168 hours. BNP (last 3 results) No results for input(s): PROBNP in the last 8760 hours. HbA1C: No results for input(s): HGBA1C in the last 72 hours. CBG: No results for input(s): GLUCAP in the last 168 hours. Lipid Profile: No results for input(s): CHOL, HDL, LDLCALC, TRIG, CHOLHDL, LDLDIRECT in the last 72 hours. Thyroid Function Tests: No results for input(s): TSH, T4TOTAL, FREET4, T3FREE, THYROIDAB in the last 72 hours. Anemia Panel: Recent Labs    12/29/17 0923  VITAMINB12 1,093*  FOLATE 8.3  FERRITIN 450*  TIBC 250  IRON 26*   Sepsis Labs: No results for input(s): PROCALCITON, LATICACIDVEN in the last 168 hours.  No results found for this or any previous visit (  from the past 240 hour(s)).       Radiology Studies: Ct Abdomen Pelvis Wo Contrast  Result Date: 12/27/2017 CLINICAL DATA:  Low abdominal pain with bloating for 2 days. No appetite. History of pancreatic cancer. Elevated liver function studies. EXAM: CT ABDOMEN AND PELVIS WITHOUT CONTRAST TECHNIQUE: Multidetector CT imaging of the abdomen and pelvis was performed following the standard protocol without IV contrast. COMPARISON:  Abdominopelvic CT 10/05/2017. FINDINGS: Lower chest: Stable mild chronic central airway thickening in both lung bases. The heart is enlarged with a chronic calcified aneurysm involving inferior wall left ventricle. No significant pleural or pericardial effusion. Hepatobiliary: No focal hepatic lesions are identified on noncontrast imaging. However, there  is new intrahepatic biliary dilatation with increased extrahepatic biliary dilatation. The gallbladder appears unremarkable. Pancreas: Ill-defined enlargement of the pancreatic head is grossly stable, suboptimally evaluated on this noncontrast study. There is a stable surgical clip or fiduciary marker at the pancreatic neck. There is stable pancreatic atrophy without significant ductal dilatation or surrounding inflammatory change. Spleen: Normal in size without focal abnormality. Adrenals/Urinary Tract: Both adrenal glands appear normal. The kidneys appears similar to the previous study. There is bilateral renal cortical thinning with low-density and hyperdense lesions bilaterally. There is mild chronic collecting system dilatation and wall thickening. There is moderate diffuse bladder wall thickening. Stomach/Bowel: No evidence of bowel wall thickening, distention or surrounding inflammatory change. The appendix appears normal. Vascular/Lymphatic: There is increased low-density in the porta hepatis, difficult to differentiate from the portal vein and bile duct on this noncontrast study, although suspicious for worsening portacaval adenopathy given the worsening biliary dilatation. This measures up to 16 mm short axis on image 22. There are no enlarged retroperitoneal or pelvic lymph nodes. Diffuse aortic and branch vessel atherosclerosis. Reproductive: Radiation clips in the prostate gland and mild enlargement of the gland are stable. Other: No ascites or peritoneal nodularity. Musculoskeletal: No acute or significant osseous findings. Chronic biconcave deformities in the lower lumbar spine are stable. No evidence of osseous metastatic disease. Stable lipomas in the external oblique muscles of the abdominal wall bilaterally. IMPRESSION: 1. New intrahepatic and progressive extrahepatic biliary dilatation consistent with obstructive jaundice from local recurrence of pancreatic cancer or nodal adenopathy in the porta  hepatis. Biliary decompression recommended. 2. No hepatic metastases identified on noncontrast imaging. No ascites or peritoneal nodularity. 3. Stable chronic mild renal collecting system dilatation and wall thickening without secondary signs of acute obstruction. Chronic bladder wall thickening. 4. Chronic calcified left ventricular aneurysm. Electronically Signed   By: Richardean Sale M.D.   On: 12/27/2017 18:00   US Renal  Result Date: 12/28/2017 CLINICAL DATA:  Acute renal insufficiency. EXAM: RENAL / URINARY TRACT ULTRASOUND COMPLETE COMPARISON:  None. FINDINGS: Right Kidney: Renal measurements: 10.6 x 5.7 x 4.7 cm = volume: 135.2 mL . Echogenicity within normal limits. No mass or hydronephrosis visualized. Left Kidney: Renal measurements: 8.5 x 4.8 x 3.2 cm = volume: 68.4 mL. Contains 2 cysts with the largest measuring 1.7 cm. Bladder: Appears normal for degree of bladder distention. IMPRESSION: 1. No cause for acute renal failure identified.  Left renal cysts. Electronically Signed   By: Dorise Bullion III M.D   On: 12/28/2017 10:53   Dg Ercp Biliary & Pancreatic Ducts  Result Date: 12/29/2017 CLINICAL DATA:  Pancreatic mass, now with biliary ductal dilatation, post ERCP and biliary stent placement. EXAM: ERCP TECHNIQUE: Multiple spot images obtained with the fluoroscopic device and submitted for interpretation post-procedure. FLUOROSCOPY TIME:  2 minutes, 42  seconds (46.3 mGy) COMPARISON:  CT abdomen pelvis-12/27/2017; 10/05/2017 FINDINGS: 7 spot fluoroscopic images of the right upper abdominal quadrant during ERCP are provided for review Initial image demonstrates an ERCP probe overlying the right upper abdominal quadrant. Surgical clips overlie the midline of the abdomen. There is faint opacification of the central intrahepatic biliary system which appears mildly dilated. There is minimal opacification of the cystic duct. Subsequent image demonstrates a long segment moderate length narrowing of  the CBD (image 2). Subsequent images demonstrate placement of an internal metallic stent within distal aspect of the CBD, traversing the long segment CBD narrowing. IMPRESSION: ERCP with metallic biliary stent placement as above. These images were submitted for radiologic interpretation only. Please see the procedural report for the amount of contrast and the fluoroscopy time utilized. Electronically Signed   By: Sandi Mariscal M.D.   On: 12/29/2017 12:52   US Abdomen Limited Ruq  Result Date: 12/27/2017 CLINICAL DATA:  Right upper quadrant pain for 3 days. EXAM: ULTRASOUND ABDOMEN LIMITED RIGHT UPPER QUADRANT COMPARISON:  None. FINDINGS: Gallbladder: No gallstones or wall thickening visualized. No sonographic Murphy sign noted by sonographer. Common bile duct: Diameter: 0.8 cm. Liver: No focal lesion identified. Within normal limits in parenchymal echogenicity. Portal vein is patent on color Doppler imaging with normal direction of blood flow towards the liver. IMPRESSION: Negative exam. Electronically Signed   By: Inge Rise M.D.   On: 12/27/2017 17:35        Scheduled Meds: . [MAR Hold] aspirin EC  81 mg Oral Daily  . [MAR Hold] carvedilol  12.5 mg Oral BID WC  . [MAR Hold] rosuvastatin  40 mg Oral Daily  . [MAR Hold] tamsulosin  0.4 mg Oral QPC supper   Continuous Infusions: . sodium chloride    . dextrose 5 % 850 mL with sodium bicarbonate 150 mEq infusion 100 mL/hr at 12/28/17 0951  . [MAR Hold] sodium chloride       LOS: 2 days    Time spent: 35 minutes    Irine Seal, MD [facility] Pager [phone]  If 7PM-7AM, please contact night-coverage [email or web address] Password [email or web address] 12/29/2017, 1:14 PM

## 2017-12-29 NOTE — Progress Notes (Signed)
[name] 11:42 AM  Subjective: Patient without any current complaints and no questions and his hospital computer chart reviewed case discussed with my partner Dr. Cristina Gong  Objective: Signs stable afebrile exam please see preassessment evaluation labs stable CT reviewed  Assessment: Obstructive jaundice  Plan: Okay to proceed with ERCP and stent placement with anesthesia assistance  [facility]  Pager [phone] After 5PM or if no answer call [phone]

## 2017-12-29 NOTE — Consult Note (Addendum)
Renal Service Consult Note [facility]  [name] 12/29/2017 [name] Requesting Physician:  Dr Fonnie Mu  Reason for Consult:  Renal failure HPI: The patient is a 72 y.o. year-old who presented to ED at 3:30 pm yesterday reporting low abd pain/ bloating x days, also poor appetite and nausea/ dry heaving. In ED liver tests where up including Tbili 6.2/ ALT 148/ AST 111.  Pt was admitted. Overnight RUQ [place] was negative. CT abd /pelv done which showed new intra/extra hepatic biliary dilatation.  Hx pancreatic cancer in the past.  Pt was admitted and this am is in Endo lab getting ERCP.  Asked to see for renal failure.   Patient reports no appetite, but no abd pain either.  No voiding issues. No fevers, chills, no CP or SOB.    I/O's here are 1.7 L in and 1.5 L UOP over 48 hrs.    H/o VSD repair in 2011 along w/ CABG.  H/o pancreatic cancer sp radiation/ chemoRx.  Had ERCP and stenting today.      Wt's up 3-4 kg.     Date  Creat  eGFR   2011- 2014 1.4- 1.9   2017  1.8- 2.1  AKI x 2, peak creat 6.1 and 9.4   2018  2.2- 2.8 31- 32   03/2017 3.20  21   09/2017 3.38  19   12/27/17 4.01  15   12/29/17 4.27  15      EChart:    08/2010 - urethral stricture w/ bilat hydro/ AKI/ CKD, pyelonephritis, hx CABG    01/2015 - acute/ chron renal failure, UT > BOO/ urethral stricture / hydro bilat , treated w/ foley cath and abx. DC'd w/ cath in place for OP urology f/u.      11/2015 - abd pain > foley placed w/ 1400 cc out, CT showed severe bilat hydro, treated w/ foley cath placement.  CKD 2.0 at dc. F/U urology OP setting.   ROS  denies CP  no joint pain   no HA  no blurry vision  no rash  no dysuria  no difficulty voiding  no change in urine color    Past Medical History  Past Medical History:  Diagnosis Date  . Acute MI, inferior wall ([place]) 2011  . Amputation finger    right first finger top portion age 46  . Aortic atherosclerosis ([place])   . ARF (acute  renal failure) ([name]) 01/24/2015  . CHF (congestive heart failure) ([place])   . CKD (chronic kidney disease), stage III ([place]) 11/2015  . Coronary artery disease cardioloigst-  dr Stanford Breed   hx inferior MI 10-08-2009 emergency CABG x4  . DOE (dyspnea on exertion)    since starting chemo  . DVT (deep venous thrombosis) ([place]) 11/05/2016   right proximal-mid peroneal veins  . Fracture of left hip ([place])   . H/O hyperkalemia   . History of acute inferior wall myocardial infarction 10/08/2009  . History of blood transfusion   . History of chemotherapy   . History of echocardiogram 03/2012   EF 45%, LAE, Mild MR, no residual VSD  . History of first degree heart block   . History of metabolic acidosis   . History of thrombocytopenia   . History of urethral stricture   . History of UTI   . Hyperlipidemia   . Hypertension   . Ischemic cardiomyopathy   . Kidney cysts    bilateral  . Multiple pulmonary nodules  Bilateral scattered  . Multiple thyroid nodules   . Obstructed, uropathy   . Obstructive uropathy   . Pancreatic carcinoma (HCC)    s/p SBRT  . Pneumonia    Childhood  . Prostate cancer ([place])   . S/P VSD repair    09/2009  . Systolic CHF, chronic ([place])   . VSD (ventricular septal defect) 09/2009   acute after MI   Past Surgical History  Past Surgical History:  Procedure Laterality Date  . CARDIAC CATHETERIZATION    . CORONARY ARTERY BYPASS GRAFT     times 4  . CYSTOSCOPY W/ RETROGRADES N/A 04/03/2015   Procedure:  BLADDER BIOPSIES;  Surgeon: Festus Aloe, MD;  Location: [facility];  Service: Urology;  Laterality: N/A;  . CYSTOSCOPY W/ RETROGRADES Bilateral 06/27/2016   Procedure: CYSTOSCOPY WITH BILATERAL RETROGRADES;  Surgeon: Festus Aloe, MD;  Location: [facility];  Service: Urology;  Laterality: Bilateral;  . CYSTOSCOPY WITH BIOPSY N/A 06/27/2016   Procedure: CYSTOSCOPY WITH BLADDER BIOPSY URETHRAL BIOPSY;  Surgeon: Festus Aloe, MD;  Location: [facility];  Service:  Urology;  Laterality: N/A;  . CYSTOSCOPY WITH URETHRAL DILATATION N/A 04/03/2015   Procedure: CYSTOSCOPY WITH BILATERAL RETROGRADES;  Surgeon: Festus Aloe, MD;  Location: [facility];  Service: Urology;  Laterality: N/A;  . EUS N/A 08/28/2016   Procedure: UPPER ENDOSCOPIC ULTRASOUND (EUS) RADIAL;  Surgeon: Milus Banister, MD;  Location: [facility];  Service: Endoscopy;  Laterality: N/A;  . flexible cystoscopy    . GOLD SEED IMPLANT N/A 07/03/2017   Procedure: GOLD SEED IMPLANT;  Surgeon: Festus Aloe, MD;  Location[facility];  Service: Urology;  Laterality: N/A;  Needs Ultrasound Tech  . repair of post infarction posterior ventricular septal defect  09/2009  . SPACE OAR INSTILLATION N/A 07/03/2017   Procedure: SPACE OAR INSTILLATION;  Surgeon: Festus Aloe, MD;  Location: [facility];  Service: Urology;  Laterality: N/A;  . TRANSURETHRAL RESECTION OF PROSTATE  06/27/2016   Procedure: TRANSURETHRAL RESECTION OF THE PROSTATE (TURP);  Surgeon: Festus Aloe, MD;  Location: [facility];  Service: Urology;;  . UPPER GI ENDOSCOPY     Family History  Family History  Problem Relation Age of Onset  . Cancer Paternal Aunt        unknown type cancer   . Cancer Paternal Uncle        unknown type cancer   Social History  reports that he quit smoking about 7 years ago. His smoking use included cigarettes. He has a 67.50 pack-year smoking history. He has never used smokeless tobacco. He reports that he does not drink alcohol or use drugs. Allergies  Allergies  Allergen Reactions  . Chlorhexidine Gluconate Other (See Comments)    Red skin and flaked skin    Home medications Prior to Admission medications   Medication Sig Start Date End Date Taking? Authorizing Provider  amLODipine (NORVASC) 10 MG tablet take 1/2 tablet by mouth once daily Patient taking differently: Take 5 mg by mouth daily.  03/05/17  Yes Lelon Perla, MD  carvedilol (COREG) 12.5 MG  tablet Take 1 tablet (12.5 mg total) by mouth 2 (two) times daily with a meal. 11/13/17  Yes Crenshaw, Denice Bors, MD  rosuvastatin (CRESTOR) 40 MG tablet Take 1 tablet (40 mg total) by mouth daily. 11/13/17  Yes Lelon Perla, MD  tamsulosin (FLOMAX) 0.4 MG CAPS capsule Take 1 capsule (0.4 mg total) by mouth daily after supper. 10/23/17  Yes Bruning, Ashlyn, PA-C  aspirin EC 81 MG tablet Take 1 tablet (81 mg total) by mouth daily. 09/24/17   Lelon Perla, MD  cephALEXin (KEFLEX) 500 MG capsule Take 1 capsule (500 mg total) by mouth 4 (four) times daily. Patient not taking: Reported on 12/27/2017 10/12/17   Molpus, Jenny Reichmann, MD   Liver Function Tests Recent Labs  Lab 12/27/17 1537 12/28/17 0506 12/29/17 0601  AST 111* 102* 129*  ALT 148* 136* 145*  ALKPHOS 520* 480* 512*  BILITOT 6.2* 5.6* 5.2*  PROT 7.8 7.5 7.2  ALBUMIN 3.3* 3.0* 2.8*   Recent Labs  Lab 12/27/17 1537  LIPASE 22   CBC Recent Labs  Lab 12/27/17 1537 12/28/17 0506 12/29/17 0601  WBC 4.3 4.8 4.3  HGB 8.4* 8.2* 8.1*  HCT 26.8* 26.2* 25.8*  MCV 86.2 87.0 84.3  PLT 268 268 124   Basic Metabolic Panel Recent Labs  Lab 12/27/17 1537 12/28/17 0506 12/29/17 0601  NA 135 137 138  K 4.0 4.1 3.8  CL 110 112* 112*  CO2 14* 14* 17*  GLUCOSE 111* 101* 138*  BUN 52* 50* 45*  CREATININE 4.01* 4.11* 4.27*  CALCIUM 9.5 9.4 9.1   Iron/TIBC/Ferritin/ %Sat    Component Value Date/Time   IRON 26 (L) 12/29/2017 0923   IRON 15 (L) 11/17/2016 1417   TIBC 250 12/29/2017 0923   TIBC 215 11/17/2016 1417   FERRITIN 450 (H) 12/29/2017 0923   FERRITIN 567 (H) 11/17/2016 1417   IRONPCTSAT 10 (L) 12/29/2017 0923   IRONPCTSAT 7 (L) 11/17/2016 1417    Vitals:   12/29/17 0141 12/29/17 0603 12/29/17 1103 12/29/17 1244  BP:  119/69 (!) 145/78 135/62  Pulse:  69 65 64  Resp:  18  20  Temp: 99.9 F (37.7 C) 100.2 F (37.9 C) 99.2 F (37.3 C) 99.3 F (37.4 C)  TempSrc: Oral Oral Oral Oral  SpO2:  98% 97% 100%  Weight:   87.7 kg    Height:       Exam Gen alert, no distress, calm older adult male No rash, cyanosis or gangrene Sclera anicteric, throat clear  No jvd or bruits Chest clear bilat to bases RRR no MRG +sternotomy scar Abd soft ntnd no mass or ascites +bs  GU normal male MS no joint effusions or deformity Ext no LE edema, no wounds or ulcers Neuro is alert, Ox 3 , nf    Home meds:  - carvedilol 12.5 bid  - rosuvastatin 40qd/ tamsulosin 0.4 mg qd  - aspirin 81 mg    UA > 100 prot, >50 wbc/ 60-10 rbc per hpf   UNa - 46   UCr - 63   Renal US 08/2010 > severe bilat hydro                    01/2015 > severe chron bilat hydro/ cort thinning/ 12-13 cm                     12/28/17 > 8-10 cm , no hydro   CT abd > new intra/extrahepatic biliary dilatation, ^LAN portal area, mild ^ collect system and mild renal cort thinning  Impression/ Plan: 1. CKD stage IV - pt has had progressive CKD related mostly to long-standing obstruction in the past due to recurrent issues w/ urethral stricture and BOO.  Here there are no signs of sig obstruction but renal failure has cont'd to deteriorate over the past few years, last year was stage III,  this year  is stage IV.  Avoid acei/ ARB/ nsaids/ dye/ vanc.  Euvolemic on exam. No indication for dialysis. Will set him up for office f/u, it's important that he goes (has been referred before and hasn't gone).  Please call w/ questions.  Will sign off.  2. Biliary obstruction - sp ERCP today 3. H/o pancreatic Cancer 4. HTN 5. CAD sp CABG 6. VSD sp repair 7. H/o prostate Ca     Kelly Splinter MD [facility] pager [phone]   12/29/2017, 1:25 PM

## 2017-12-29 NOTE — Anesthesia Procedure Notes (Signed)
Procedure Name: Intubation Date/Time: 12/29/2017 11:55 AM Performed by: Maxwell Caul, CRNA Pre-anesthesia Checklist: Patient identified, Emergency Drugs available, Suction available and Patient being monitored Patient Re-evaluated:Patient Re-evaluated prior to induction Oxygen Delivery Method: Circle system utilized Preoxygenation: Pre-oxygenation with 100% oxygen Induction Type: IV induction Ventilation: Mask ventilation without difficulty Laryngoscope Size: Mac and 4 Grade View: Grade I Tube type: Oral Number of attempts: 1 Airway Equipment and Method: Stylet Placement Confirmation: ETT inserted through vocal cords under direct vision,  positive ETCO2 and breath sounds checked- equal and bilateral Secured at: 22 cm Tube secured with: Tape Dental Injury: Teeth and Oropharynx as per pre-operative assessment

## 2017-12-29 NOTE — Transfer of Care (Signed)
Immediate Anesthesia Transfer of Care Note  Patient: [name]  Procedure(s) Performed: ENDOSCOPIC RETROGRADE CHOLANGIOPANCREATOGRAPHY (ERCP) WITH PROPOFOL (N/A ) SPHINCTEROTOMY BILIARY STENT PLACEMENT (N/A )  Patient Location: PACU and Endoscopy Unit  Anesthesia Type:General  Level of Consciousness: awake and patient cooperative  Airway & Oxygen Therapy: Patient Spontanous Breathing and Patient connected to face mask oxygen  Post-op Assessment: Report given to RN and Post -op Vital signs reviewed and stable  Post vital signs: Reviewed and stable  Last Vitals:  Vitals Value Taken Time  BP 135/62 12/29/2017 12:44 PM  Temp 37.4 C 12/29/2017 12:44 PM  Pulse 65 12/29/2017 12:45 PM  Resp 20 12/29/2017 12:45 PM  SpO2 100 % 12/29/2017 12:45 PM  Vitals shown include unvalidated device data.  Last Pain:  Vitals:   12/29/17 1244  TempSrc: Oral  PainSc: 0-No pain         Complications: No apparent anesthesia complications

## 2017-12-30 ENCOUNTER — Encounter (HOSPITAL_COMMUNITY): Payer: Self-pay | Admitting: Gastroenterology

## 2017-12-30 DIAGNOSIS — I10 Essential (primary) hypertension: Secondary | ICD-10-CM

## 2017-12-30 LAB — CBC WITH DIFFERENTIAL/PLATELET
Abs Immature Granulocytes: 0.01 10*3/uL (ref 0.00–0.07)
BASOS ABS: 0 10*3/uL (ref 0.0–0.1)
Basophils Relative: 0 %
Eosinophils Absolute: 0 10*3/uL (ref 0.0–0.5)
Eosinophils Relative: 0 %
HCT: 28.3 % — ABNORMAL LOW (ref 39.0–52.0)
Hemoglobin: 8.8 g/dL — ABNORMAL LOW (ref 13.0–17.0)
Immature Granulocytes: 0 %
Lymphocytes Relative: 12 %
Lymphs Abs: 0.3 10*3/uL — ABNORMAL LOW (ref 0.7–4.0)
MCH: 26.6 pg (ref 26.0–34.0)
MCHC: 31.1 g/dL (ref 30.0–36.0)
MCV: 85.5 fL (ref 80.0–100.0)
Monocytes Absolute: 0.2 10*3/uL (ref 0.1–1.0)
Monocytes Relative: 9 %
Neutro Abs: 2 10*3/uL (ref 1.7–7.7)
Neutrophils Relative %: 79 %
Platelets: 226 10*3/uL (ref 150–400)
RBC: 3.31 MIL/uL — ABNORMAL LOW (ref 4.22–5.81)
RDW: 17.9 % — AB (ref 11.5–15.5)
WBC: 2.5 10*3/uL — ABNORMAL LOW (ref 4.0–10.5)
nRBC: 0 % (ref 0.0–0.2)

## 2017-12-30 LAB — COMPREHENSIVE METABOLIC PANEL
ALT: 144 U/L — ABNORMAL HIGH (ref 0–44)
AST: 102 U/L — ABNORMAL HIGH (ref 15–41)
Albumin: 2.9 g/dL — ABNORMAL LOW (ref 3.5–5.0)
Alkaline Phosphatase: 511 U/L — ABNORMAL HIGH (ref 38–126)
Anion gap: 13 (ref 5–15)
BUN: 44 mg/dL — ABNORMAL HIGH (ref 8–23)
CALCIUM: 9.8 mg/dL (ref 8.9–10.3)
CO2: 14 mmol/L — AB (ref 22–32)
Chloride: 109 mmol/L (ref 98–111)
Creatinine, Ser: 4.37 mg/dL — ABNORMAL HIGH (ref 0.61–1.24)
GFR calc Af Amer: 14 mL/min — ABNORMAL LOW (ref >60)
GFR calc non Af Amer: 12 mL/min — ABNORMAL LOW (ref >60)
Glucose, Bld: 145 mg/dL — ABNORMAL HIGH (ref 70–99)
Potassium: 4.1 mmol/L (ref 3.5–5.1)
SODIUM: 136 mmol/L (ref 135–145)
Total Bilirubin: 2.6 mg/dL — ABNORMAL HIGH (ref 0.3–1.2)
Total Protein: 7.8 g/dL (ref 6.5–8.1)

## 2017-12-30 NOTE — Care Management Important Message (Signed)
Important Message  Patient Details  Name: ZAMIER EGGEBRECHT MRN: [id number] Date of Birth: Feb 04, 1945   Medicare Important Message Given:  Yes    Kerin Salen 12/30/2017, 12:26 [place] Message  Patient Details  Name: LOIS OSTROM MRN: [id number] Date of Birth: December 27, 1945   Medicare Important Message Given:  Yes    Kerin Salen 12/30/2017, 12:26 PM

## 2017-12-30 NOTE — Progress Notes (Signed)
Patient feeling well.  No significant pain.  Tolerating food.  White count normal, hemoglobin improved, substantial drop in bilirubin with mild drop in transaminases 1 day following ERCP with stent placement for pancreatic cancer.  Impression: Successful biliary decompression  Recommendation: Okay for discharge at any time from our standpoint.  We will sign off.  Please call us if further assistance from [place] is desired.  Cleotis Nipper, M.D. Pager [phone] If no answer or after 5 PM call [phone]

## 2017-12-30 NOTE — Anesthesia Postprocedure Evaluation (Signed)
Anesthesia Post Note  Patient: [name]  Procedure(s) Performed: ENDOSCOPIC RETROGRADE CHOLANGIOPANCREATOGRAPHY (ERCP) WITH PROPOFOL (N/A ) SPHINCTEROTOMY BILIARY STENT PLACEMENT (N/A )     Patient location during evaluation: PACU Anesthesia Type: General Level of consciousness: awake and alert Pain management: pain level controlled Vital Signs Assessment: post-procedure vital signs reviewed and stable Respiratory status: spontaneous breathing, nonlabored ventilation, respiratory function stable and patient connected to nasal cannula oxygen Cardiovascular status: blood pressure returned to baseline and stable Postop Assessment: no apparent nausea or vomiting Anesthetic complications: no    Last Vitals:  Vitals:   12/30/17 0548 12/30/17 0723  BP: 135/76   Pulse: (!) 55 60  Resp: 20   Temp: 37.1 C   SpO2: 97%     Last Pain:  Vitals:   12/30/17 0725  TempSrc:   PainSc: 0-No pain                 Daveah Varone S

## 2017-12-30 NOTE — Progress Notes (Signed)
Patient Demographics:    [name], is a 72 y.o. male, DOB - February 01, 1945, XTG:[id number]  Admit date - 12/27/2017   Admitting Physician Lenore Cordia, MD  Outpatient Primary MD for the patient is Patient, No Pcp Per  LOS - 3   Chief Complaint  Patient presents with  . Abdominal Pain        Subjective:    [name] today has no fevers, no emesis,  No chest pain, complains of fatigue and poor appetite  Assessment  & Plan :    Principal Problem:   Obstructive jaundice due to cancer [facility]) Active Problems:   HYPERTENSION, BENIGN   Coronary atherosclerosis of native coronary artery   Hyperlipidemia   Malignant neoplasm of prostate (HCC)   Pancreatic cancer (HCC)   CKD (chronic kidney disease), stage IV (HCC)   ARF (acute renal failure) [facility])  Brief Summary 71 year old gentleman history of pancreatic cancer status post SBRT, prostate cancer status post TURP and XRT, coronary artery disease status post CABG, chronic kidney disease stage IV, VSD status post repair, hypertension hyperlipidemia presented to the ED with 2-day history of bilateral lower abdominal pain with nausea and emesis, admitted 12/27/2017 with obstructive jaundice secondary to underlying pancreatic malignancy   Plan:- 1) obstructive jaundice in the setting of pancreatic Cancer s/p SBRT--- status post ERCP and stent placement on 12/29/2017, bilirubin trending down per Dr. Burr Medico possible discharge on 12/31/2017, ???  Poor candidate for surgical treatment, get palliative care consult to determine goals of care,   2)AKI----acute kidney injury on CKD stage - IV due to nausea vomiting and volume depletion,   creatinine on admission= 4.0  ,   baseline creatinine = 3.38 (09/2017)   , creatinine is now= 4.37    , renally adjust medications, avoid nephrotoxic agents/dehydration/hypotension  3)CAD/S/p CABG and HTN--- stable, continue Coreg,  also continue aspirin and Crestor  4)History of prostate cancer- Status post TURP and XRT.  Continue Flomax.  Outpatient follow-up.  5)FEN--- very poor oral intake okay to advance from liquids to soft diet and see if he tolerates  Disposition/Need for in-Hospital Stay- patient unable to be discharged at this time due to poor oral intake and worsening renal function, also need to monitor H&H    code Status : FULL   Disposition Plan  :   Per Dr. Burr Medico possible discharge on 12/31/2017,   Consults  :  Gi and Oncology  DVT prophylaxis: SCDs Code Status: Full Family Communication: Updated patient.  No family at bedside. Disposition Plan: To be determined.   Consultants:   Gastroenterology: Dr. Cristina Gong 12/28/2017  Oncology: Dr. Burr Medico 12/28/2017  Procedures:   CT abdomen and pelvis 12/27/2017  Renal ultrasound 12/28/2017  Right upper quadrant ultrasound 12/27/2017  ERCP with endoscopic stent placement into the biliary or pancreatic duct by Dr. Watt Climes 12/29/2017  DVT Prophylaxis  :    SCDs   Lab Results  Component Value Date   PLT 226 12/30/2017    Inpatient Medications  Scheduled Meds: . aspirin EC  81 mg Oral Daily  . carvedilol  12.5 mg Oral BID WC  . rosuvastatin  40 mg Oral Daily  . sodium bicarbonate  650 mg Oral BID  . tamsulosin  0.4 mg Oral  QPC supper   Continuous Infusions: . sodium chloride     PRN Meds:.acetaminophen, ondansetron **OR** ondansetron (ZOFRAN) IV    Anti-infectives (From admission, onward)   None        Objective:   Vitals:   12/30/17 0548 12/30/17 0723 12/30/17 1357 12/30/17 1806  BP: 135/76  126/77   Pulse: (!) 55 60 (!) 54 60  Resp: 20  18   Temp: 98.7 F (37.1 C)  98.1 F (36.7 C)   TempSrc: Oral  Oral   SpO2: 97%  98%   Weight: 83.2 kg     Height: 5\' 7"  (1.702 m)       Wt Readings from Last 3 Encounters:  12/30/17 83.2 kg  10/23/17 91.9 kg  10/12/17 91.2 kg     Intake/Output Summary (Last 24 hours) at  12/30/2017 1829 Last data filed at 12/30/2017 1359 Gross per 24 hour  Intake 480 ml  Output 1850 ml  Net -1370 ml     Physical Exam Patient is examined daily including today on 12/30/17 , exams remain the same as of yesterday except that has changed   Gen:- Awake Alert, looks ill and tired HEENT:- [place].AT, No sclera icterus Neck-Supple Neck,No JVD,.  Lungs-  CTAB , fair symmetrical air movement CV- S1, S2 normal, regular , CABG scar Abd-  +ve B.Sounds, Abd Soft, No tenderness,    Extremity/Skin:-   pedal pulses present  Psych-affect is appropriate, oriented x3 Neuro-no new focal deficits, no tremors   Data Review:   Micro Results Recent Results (from the past 240 hour(s))  Culture, blood (routine x 2)     Status: None (Preliminary result)   Collection Time: 12/29/17 12:10 AM  Result Value Ref Range Status   Specimen Description   Final    BLOOD RIGHT HAND Performed at [facility] [address] [place], [place] [address]    Special Requests   Final    BOTTLES DRAWN AEROBIC ONLY Blood Culture adequate volume Performed at [facility] [address], [place], [place] [address]    Culture   Final    NO GROWTH 1 DAY Performed at [facility], [place] [address] [place], [place] [address]    Report Status PENDING  Incomplete  Culture, blood (routine x 2)     Status: None (Preliminary result)   Collection Time: 12/29/17 12:10 AM  Result Value Ref Range Status   Specimen Description   Final    BLOOD RIGHT WRIST Performed at [facility] [address], [place], [place] [address]    Special Requests   Final    BOTTLES DRAWN AEROBIC AND ANAEROBIC Blood Culture adequate volume Performed at [facility] [address] [place], [place] [address]    Culture   Final    NO GROWTH 1 DAY Performed at [facility], [place] [address], [place], [place] [address]    Report Status PENDING   Incomplete    Radiology Reports Ct Abdomen Pelvis Wo Contrast  Result Date: 12/27/2017 CLINICAL DATA:  Low abdominal pain with bloating for 2 days. No appetite. History of pancreatic cancer. Elevated liver function studies. EXAM: CT ABDOMEN AND PELVIS WITHOUT CONTRAST TECHNIQUE: Multidetector CT imaging of the abdomen and pelvis was performed following the standard protocol without IV contrast. COMPARISON:  Abdominopelvic CT 10/05/2017. FINDINGS: Lower chest: Stable mild chronic central airway thickening in both lung bases. The heart is enlarged with a chronic calcified aneurysm  involving inferior wall left ventricle. No significant pleural or pericardial effusion. Hepatobiliary: No focal hepatic lesions are identified on noncontrast imaging. However, there is new intrahepatic biliary dilatation with increased extrahepatic biliary dilatation. The gallbladder appears unremarkable. Pancreas: Ill-defined enlargement of the pancreatic head is grossly stable, suboptimally evaluated on this noncontrast study. There is a stable surgical clip or fiduciary marker at the pancreatic neck. There is stable pancreatic atrophy without significant ductal dilatation or surrounding inflammatory change. Spleen: Normal in size without focal abnormality. Adrenals/Urinary Tract: Both adrenal glands appear normal. The kidneys appears similar to the previous study. There is bilateral renal cortical thinning with low-density and hyperdense lesions bilaterally. There is mild chronic collecting system dilatation and wall thickening. There is moderate diffuse bladder wall thickening. Stomach/Bowel: No evidence of bowel wall thickening, distention or surrounding inflammatory change. The appendix appears normal. Vascular/Lymphatic: There is increased low-density in the porta hepatis, difficult to differentiate from the portal vein and bile duct on this noncontrast study, although suspicious for worsening portacaval adenopathy given the  worsening biliary dilatation. This measures up to 16 mm short axis on image 22. There are no enlarged retroperitoneal or pelvic lymph nodes. Diffuse aortic and branch vessel atherosclerosis. Reproductive: Radiation clips in the prostate gland and mild enlargement of the gland are stable. Other: No ascites or peritoneal nodularity. Musculoskeletal: No acute or significant osseous findings. Chronic biconcave deformities in the lower lumbar spine are stable. No evidence of osseous metastatic disease. Stable lipomas in the external oblique muscles of the abdominal wall bilaterally. IMPRESSION: 1. New intrahepatic and progressive extrahepatic biliary dilatation consistent with obstructive jaundice from local recurrence of pancreatic cancer or nodal adenopathy in the porta hepatis. Biliary decompression recommended. 2. No hepatic metastases identified on noncontrast imaging. No ascites or peritoneal nodularity. 3. Stable chronic mild renal collecting system dilatation and wall thickening without secondary signs of acute obstruction. Chronic bladder wall thickening. 4. Chronic calcified left ventricular aneurysm. Electronically Signed   By: Richardean Sale M.D.   On: 12/27/2017 18:00   US Renal  Result Date: 12/28/2017 CLINICAL DATA:  Acute renal insufficiency. EXAM: RENAL / URINARY TRACT ULTRASOUND COMPLETE COMPARISON:  None. FINDINGS: Right Kidney: Renal measurements: 10.6 x 5.7 x 4.7 cm = volume: 135.2 mL . Echogenicity within normal limits. No mass or hydronephrosis visualized. Left Kidney: Renal measurements: 8.5 x 4.8 x 3.2 cm = volume: 68.4 mL. Contains 2 cysts with the largest measuring 1.7 cm. Bladder: Appears normal for degree of bladder distention. IMPRESSION: 1. No cause for acute renal failure identified.  Left renal cysts. Electronically Signed   By: Dorise Bullion III M.D   On: 12/28/2017 10:53   Dg Ercp Biliary & Pancreatic Ducts  Result Date: 12/29/2017 CLINICAL DATA:  Pancreatic mass, now with  biliary ductal dilatation, post ERCP and biliary stent placement. EXAM: ERCP TECHNIQUE: Multiple spot images obtained with the fluoroscopic device and submitted for interpretation post-procedure. FLUOROSCOPY TIME:  2 minutes, 42 seconds (46.3 mGy) COMPARISON:  CT abdomen pelvis-12/27/2017; 10/05/2017 FINDINGS: 7 spot fluoroscopic images of the right upper abdominal quadrant during ERCP are provided for review Initial image demonstrates an ERCP probe overlying the right upper abdominal quadrant. Surgical clips overlie the midline of the abdomen. There is faint opacification of the central intrahepatic biliary system which appears mildly dilated. There is minimal opacification of the cystic duct. Subsequent image demonstrates a long segment moderate length narrowing of the CBD (image 2). Subsequent images demonstrate placement of an internal metallic stent within distal aspect of  the CBD, traversing the long segment CBD narrowing. IMPRESSION: ERCP with metallic biliary stent placement as above. These images were submitted for radiologic interpretation only. Please see the procedural report for the amount of contrast and the fluoroscopy time utilized. Electronically Signed   By: Sandi Mariscal M.D.   On: 12/29/2017 12:52   US Abdomen Limited Ruq  Result Date: 12/27/2017 CLINICAL DATA:  Right upper quadrant pain for 3 days. EXAM: ULTRASOUND ABDOMEN LIMITED RIGHT UPPER QUADRANT COMPARISON:  None. FINDINGS: Gallbladder: No gallstones or wall thickening visualized. No sonographic Murphy sign noted by sonographer. Common bile duct: Diameter: 0.8 cm. Liver: No focal lesion identified. Within normal limits in parenchymal echogenicity. Portal vein is patent on color Doppler imaging with normal direction of blood flow towards the liver. IMPRESSION: Negative exam. Electronically Signed   By: Inge Rise M.D.   On: 12/27/2017 17:35     CBC Recent Labs  Lab 12/27/17 1537 12/28/17 0506 12/29/17 0601 12/30/17 0332    WBC 4.3 4.8 4.3 2.5*  HGB 8.4* 8.2* 8.1* 8.8*  HCT 26.8* 26.2* 25.8* 28.3*  PLT 268 268 249 226  MCV 86.2 87.0 84.3 85.5  MCH 27.0 27.2 26.5 26.6  MCHC 31.3 31.3 31.4 31.1  RDW 17.8* 18.1* 17.8* 17.9*  LYMPHSABS  --   --   --  0.3*  MONOABS  --   --   --  0.2  EOSABS  --   --   --  0.0  BASOSABS  --   --   --  0.0    Chemistries  Recent Labs  Lab 12/27/17 1537 12/28/17 0506 12/29/17 0601 12/30/17 0332  NA 135 137 138 136  K 4.0 4.1 3.8 4.1  CL 110 112* 112* 109  CO2 14* 14* 17* 14*  GLUCOSE 111* 101* 138* 145*  BUN 52* 50* 45* 44*  CREATININE 4.01* 4.11* 4.27* 4.37*  CALCIUM 9.5 9.4 9.1 9.8  AST 111* 102* 129* 102*  ALT 148* 136* 145* 144*  ALKPHOS 520* 480* 512* 511*  BILITOT 6.2* 5.6* 5.2* 2.6*   ------------------------------------------------------------------------------------------------------------------ No results for input(s): CHOL, HDL, LDLCALC, TRIG, CHOLHDL, LDLDIRECT in the last 72 hours.  Lab Results  Component Value Date   HGBA1C (H) 10/08/2009    6.1 (NOTE)                                                                       According to the ADA Clinical Practice Recommendations for 2011, when HbA1c is used as a screening test:   >=6.5%   Diagnostic of Diabetes Mellitus           (if abnormal result  is confirmed)  5.7-6.4%   Increased risk of developing Diabetes Mellitus  References:Diagnosis and Classification of Diabetes Mellitus,Diabetes Care,2011,34(Suppl 1):S62-S69 and Standards of Medical Care in         Diabetes - 2011,Diabetes WLNL,8921,19  (Suppl 1):S11-S61.   ------------------------------------------------------------------------------------------------------------------ No results for input(s): TSH, T4TOTAL, T3FREE, THYROIDAB in the last 72 hours.  Invalid input(s): FREET3 ------------------------------------------------------------------------------------------------------------------ Recent Labs    12/29/17 0923  VITAMINB12 1,093*   FOLATE 8.3  FERRITIN 450*  TIBC 250  IRON 26*    Coagulation profile Recent Labs  Lab 12/29/17 0601  INR 1.21  No results for input(s): DDIMER in the last 72 hours.  Cardiac Enzymes No results for input(s): CKMB, TROPONINI, MYOGLOBIN in the last 168 hours.  Invalid input(s): CK ------------------------------------------------------------------------------------------------------------------    Component Value Date/Time   BNP 100.7 (H) 11/17/2015 3736     Roxan Hockey M.D on 12/30/2017 at 6:29 PM  Pager---[phone] Go to [email or web address] - password [email or web address] for contact info  [facility] - Office  [phone]

## 2017-12-30 NOTE — Progress Notes (Signed)
[name]   DOB:March 18, 1945   YP#:[id number]   IWP#:[id number]  Oncology f/u   Subjective: Patient underwent MRCP and stent placement yesterday, tolerated procedure very well, and his bilirubin is trending down now.  He overall feels better, abdominal pain has decreased, he may go home tomorrow.   Objective:  Vitals:   12/30/17 0723 12/30/17 1357  BP:  126/77  Pulse: 60 (!) 54  Resp:  18  Temp:  98.1 F (36.7 C)  SpO2:  98%    Body mass index is 28.73 kg/m.  Intake/Output Summary (Last 24 hours) at 12/30/2017 1724 Last data filed at 12/30/2017 1359 Gross per 24 hour  Intake 480 ml  Output 1850 ml  Net -1370 ml     Sclerae (+) jaundice   Oropharynx clear  No peripheral adenopathy  Lungs clear -- no rales or rhonchi  Heart regular rate and rhythm  Abdomen benign  MSK no focal spinal tenderness, no peripheral edema  Neuro nonfocal   CBG (last 3)  No results for input(s): GLUCAP in the last 72 hours.   Labs:  Lab Results  Component Value Date   WBC 2.5 (L) 12/30/2017   HGB 8.8 (L) 12/30/2017   HCT 28.3 (L) 12/30/2017   MCV 85.5 12/30/2017   PLT 226 12/30/2017   NEUTROABS 2.0 12/30/2017   CMP Latest Ref Rng & Units 12/30/2017 12/29/2017 12/28/2017  Glucose 70 - 99 mg/dL 145(H) 138(H) 101(H)  BUN 8 - 23 mg/dL 44(H) 45(H) 50(H)  Creatinine 0.61 - 1.24 mg/dL 4.37(H) 4.27(H) 4.11(H)  Sodium 135 - 145 mmol/L 136 138 137  Potassium 3.5 - 5.1 mmol/L 4.1 3.8 4.1  Chloride 98 - 111 mmol/L 109 112(H) 112(H)  CO2 22 - 32 mmol/L 14(L) 17(L) 14(L)  Calcium 8.9 - 10.3 mg/dL 9.8 9.1 9.4  Total Protein 6.5 - 8.1 g/dL 7.8 7.2 7.5  Total Bilirubin 0.3 - 1.2 mg/dL 2.6(H) 5.2(H) 5.6(H)  Alkaline Phos 38 - 126 U/L 511(H) 512(H) 480(H)  AST 15 - 41 U/L 102(H) 129(H) 102(H)  ALT 0 - 44 U/L 144(H) 145(H) 136(H)     Urine Studies No results for input(s): UHGB, CRYS in the last 72 hours.  Invalid input(s): UACOL, UAPR, USPG, UPH, UTP, UGL, UKET, UBIL, UNIT, UROB, [place], UEPI,  UWBC, Duwayne Heck [place], [place]  Basic Metabolic Panel: Recent Labs  Lab 12/27/17 1537 12/28/17 0506 12/29/17 0601 12/30/17 0332  NA 135 137 138 136  K 4.0 4.1 3.8 4.1  CL 110 112* 112* 109  CO2 14* 14* 17* 14*  GLUCOSE 111* 101* 138* 145*  BUN 52* 50* 45* 44*  CREATININE 4.01* 4.11* 4.27* 4.37*  CALCIUM 9.5 9.4 9.1 9.8   GFR Estimated Creatinine Clearance: 15.3 mL/min (A) (by C-G formula based on SCr of 4.37 mg/dL (H)). Liver Function Tests: Recent Labs  Lab 12/27/17 1537 12/28/17 0506 12/29/17 0601 12/30/17 0332  AST 111* 102* 129* 102*  ALT 148* 136* 145* 144*  ALKPHOS 520* 480* 512* 511*  BILITOT 6.2* 5.6* 5.2* 2.6*  PROT 7.8 7.5 7.2 7.8  ALBUMIN 3.3* 3.0* 2.8* 2.9*   Recent Labs  Lab 12/27/17 1537  LIPASE 22   No results for input(s): AMMONIA in the last 168 hours. Coagulation profile Recent Labs  Lab 12/29/17 0601  INR 1.21    CBC: Recent Labs  Lab 12/27/17 1537 12/28/17 0506 12/29/17 0601 12/30/17 0332  WBC 4.3 4.8 4.3 2.5*  NEUTROABS  --   --   --  2.0  HGB 8.4* 8.2* 8.1* 8.8*  HCT 26.8* 26.2* 25.8* 28.3*  MCV 86.2 87.0 84.3 85.5  PLT 268 268 249 226   Cardiac Enzymes: No results for input(s): CKTOTAL, CKMB, CKMBINDEX, TROPONINI in the last 168 hours. BNP: Invalid input(s): POCBNP CBG: No results for input(s): GLUCAP in the last 168 hours. D-Dimer No results for input(s): DDIMER in the last 72 hours. Hgb A1c No results for input(s): HGBA1C in the last 72 hours. Lipid Profile No results for input(s): CHOL, HDL, LDLCALC, TRIG, CHOLHDL, LDLDIRECT in the last 72 hours. Thyroid function studies No results for input(s): TSH, T4TOTAL, T3FREE, THYROIDAB in the last 72 hours.  Invalid input(s): FREET3 Anemia work up Recent Labs    12/29/17 0923  VITAMINB12 1,093*  FOLATE 8.3  FERRITIN 450*  TIBC 250  IRON 26*   Microbiology Recent Results (from the past 240 hour(s))  Culture, blood (routine x 2)     Status: None (Preliminary  result)   Collection Time: 12/29/17 12:10 AM  Result Value Ref Range Status   Specimen Description   Final    BLOOD RIGHT HAND Performed at [facility], [place] [address] [place], [place] [address]    Special Requests   Final    BOTTLES DRAWN AEROBIC ONLY Blood Culture adequate volume Performed at [facility] [address] [place], [place] [address]    Culture   Final    NO GROWTH 1 DAY Performed at [facility], [place] [address] [place], [place] [address]    Report Status PENDING  Incomplete  Culture, blood (routine x 2)     Status: None (Preliminary result)   Collection Time: 12/29/17 12:10 AM  Result Value Ref Range Status   Specimen Description   Final    BLOOD RIGHT WRIST Performed at [facility] [address] [place], [place] [address]    Special Requests   Final    BOTTLES DRAWN AEROBIC AND ANAEROBIC Blood Culture adequate volume Performed at [facility] [address] [place], [place] [address]    Culture   Final    NO GROWTH 1 DAY Performed at [facility], [place] [address] [place],  [address]    Report Status PENDING  Incomplete      Studies:  Dg Ercp Biliary & Pancreatic Ducts  Result Date: 12/29/2017 CLINICAL DATA:  Pancreatic mass, now with biliary ductal dilatation, post ERCP and biliary stent placement. EXAM: ERCP TECHNIQUE: Multiple spot images obtained with the fluoroscopic device and submitted for interpretation post-procedure. FLUOROSCOPY TIME:  2 minutes, 42 seconds (46.3 mGy) COMPARISON:  CT abdomen pelvis-12/27/2017; 10/05/2017 FINDINGS: 7 spot fluoroscopic images of the right upper abdominal quadrant during ERCP are provided for review Initial image demonstrates an ERCP probe overlying the right upper abdominal quadrant. Surgical clips overlie the midline of the abdomen. There is faint opacification of the central intrahepatic biliary system which  appears mildly dilated. There is minimal opacification of the cystic duct. Subsequent image demonstrates a long segment moderate length narrowing of the CBD (image 2). Subsequent images demonstrate placement of an internal metallic stent within distal aspect of the CBD, traversing the long segment CBD narrowing. IMPRESSION: ERCP with metallic biliary stent placement as above. These images were submitted for radiologic interpretation only. Please see the procedural report for the amount of contrast and the fluoroscopy time utilized. Electronically Signed   By: Sandi Mariscal M.D.   On: 12/29/2017 12:52  Assessment: 72 y.o. with past medical history of borderline resectable pancreatic cancer and prostate cancer in June 2018, CKD, coronary artery disease status post CABG, hypertension, presented to ED with 2 days of bilateral lower abdominal pain, nausea and vomiting.  1.  Obstructive jaundice, secondary to progression of his pancreatic cancer 2.  Borderline resectable pancreatic cancer, diagnosed in June 2018, status post brief chemo and radiation, lost f/u after 03/2017  3.  Acute on chronic renal failure, stable  4.  Coronary artery disease status post CABG 5.  Prostate cancer, status post TURP and radiation     Plan:  -His tbil had dropped to 2.6 this morning, he overall feels better too -We have reviewed his scan in our GI tumor board this morning.  Due to the lack of contrast, we cannot reliably assess the resectability of his pancreatic tumor, or ruled out small liver metastasis.  Patient is elderly, frail, has multiple comorbidities, and lost f/u early this year, Dr. Barry Dienes feels he is not a good candidate for surgical expiration.   -He is also a very poor candidate for chemotherapy.  I think goal of his pancreatic cancer treatment is palliative, and may consider hospice. -Patient cannot tell me exactly what he wants in regard to his pancreatic cancer treatment.  I encouraged him to think about  it, and return to my clinic in 2 to 3 weeks with his family members to discuss further.  He agrees. -I will set up his f/u appointment   Truitt Merle, MD 12/30/2017  5:24 PM

## 2017-12-31 ENCOUNTER — Telehealth: Payer: Self-pay | Admitting: Hematology

## 2017-12-31 DIAGNOSIS — C801 Malignant (primary) neoplasm, unspecified: Secondary | ICD-10-CM

## 2017-12-31 DIAGNOSIS — Z515 Encounter for palliative care: Secondary | ICD-10-CM

## 2017-12-31 DIAGNOSIS — C250 Malignant neoplasm of head of pancreas: Secondary | ICD-10-CM

## 2017-12-31 DIAGNOSIS — Z7189 Other specified counseling: Secondary | ICD-10-CM

## 2017-12-31 LAB — BASIC METABOLIC PANEL
Anion gap: 13 (ref 5–15)
BUN: 50 mg/dL — ABNORMAL HIGH (ref 8–23)
CO2: 15 mmol/L — ABNORMAL LOW (ref 22–32)
Calcium: 9.3 mg/dL (ref 8.9–10.3)
Chloride: 104 mmol/L (ref 98–111)
Creatinine, Ser: 4.55 mg/dL — ABNORMAL HIGH (ref 0.61–1.24)
GFR calc Af Amer: 14 mL/min — ABNORMAL LOW (ref >60)
GFR calc non Af Amer: 12 mL/min — ABNORMAL LOW (ref >60)
Glucose, Bld: 95 mg/dL (ref 70–99)
POTASSIUM: 4 mmol/L (ref 3.5–5.1)
Sodium: 132 mmol/L — ABNORMAL LOW (ref 135–145)

## 2017-12-31 LAB — URINE CULTURE: Culture: NO GROWTH

## 2017-12-31 LAB — CBC
HCT: 29.2 % — ABNORMAL LOW (ref 39.0–52.0)
Hemoglobin: 9.1 g/dL — ABNORMAL LOW (ref 13.0–17.0)
MCH: 26.2 pg (ref 26.0–34.0)
MCHC: 31.2 g/dL (ref 30.0–36.0)
MCV: 84.1 fL (ref 80.0–100.0)
Platelets: 238 10*3/uL (ref 150–400)
RBC: 3.47 MIL/uL — AB (ref 4.22–5.81)
RDW: 17.6 % — ABNORMAL HIGH (ref 11.5–15.5)
WBC: 5.6 10*3/uL (ref 4.0–10.5)
nRBC: 0 % (ref 0.0–0.2)

## 2017-12-31 MED ORDER — MEGESTROL ACETATE 400 MG/10ML PO SUSP
400.0000 mg | Freq: Every day | ORAL | Status: DC
  Administered 2017-12-31 – 2018-01-01 (×2): 400 mg via ORAL
  Filled 2017-12-31 (×2): qty 10

## 2017-12-31 MED ORDER — SODIUM CHLORIDE 0.9 % IV SOLN
INTRAVENOUS | Status: DC
  Administered 2017-12-31 – 2018-01-01 (×2): via INTRAVENOUS

## 2017-12-31 NOTE — Consult Note (Signed)
Consultation Note Date: 12/31/2017   Patient Name: [name]  DOB: 1945-01-24  MRN: [id number]  Age / Sex: 72 y.o., male  PCP: Patient, No Pcp Per Referring Physician: Roxan Hockey, MD  Reason for Consultation: Establishing goals of care  HPI/Patient Profile: 72 y.o. male  with past medical history of pancreatic cancer status post SBRT, prostate cancer status post TURP, coronary disease status post CABG, chronic kidney disease stage IV, VSD, hypertension, hyperlipidemia admitted on 12/27/2017 with obstructive jaundice, AKI.   Clinical Assessment and Goals of Care: I met today with Mr. Zajicek.   I introduced palliative care as specialized medical care for people living with serious illness. It focuses on providing relief from the symptoms and stress of a serious illness. The goal is to improve quality of life for both the patient and the family.  Mr. Stutsman reports that the physicians have been doing a good job explaining things to him and he understands the severity of the situation.  He states that plan is for him to go home for couple of weeks and then meet with Dr. Burr Medico to discuss if he is likely to benefit from further disease modifying therapy.  He feels that he needs to "get through the holiday and have time to discuss with my sons" prior to making any further decisions about his care plan.  Concept of Hospice and Palliative Care were discussed.  I recommended that he be followed by the care connections program through [facility] [place] on discharge.  He reports being agreeable to this.  I also provided him with a copy of Hard Choices for Loving People to review.  Questions and concerns addressed.   PMT will continue to support holistically.  SUMMARY OF RECOMMENDATIONS   - Patient reports plan to follow-up with Dr. Burr Medico in a couple of weeks to further discuss care plan as he is noted to  not be likely to benefit from further disease modifying therapy.  We discussed the goal of him forward is to add as much time and quality to his life as possible.  We discussed how aggressive symptom management through organization such as hospice is most likely way to benefit him at the point where further disease modifying therapy is not likely to be helpful.  He reports needing time to discuss this with his family.  I gave him a copy of Hard Choices for Loving People to review.  I strongly recommend that he be followed by outpatient palliative care with care connections program through [facility] [place].  I called and discussed this with social work. -We will plan to follow-up with him again tomorrow to see if he is open to further discussion at that time.  At time of my encounter today, he reports that he needs the opportunity to discuss with family prior to making any other decisions.  Code Status/Advance Care Planning:  Full code  Psycho-social/Spiritual:   Desire for further Chaplaincy support:no  Additional Recommendations: Education on Hospice  Prognosis:   < 6  months  Discharge Planning: Home with Palliative Services      Primary Diagnoses: Present on Admission: . Obstructive jaundice due to cancer ([place]) . Pancreatic cancer ([place]) . HYPERTENSION, BENIGN . Coronary atherosclerosis of native coronary artery . Hyperlipidemia . Malignant neoplasm of prostate ([place])   I have reviewed the medical record, interviewed the patient and family, and examined the patient. The following aspects are pertinent.  Past Medical History:  Diagnosis Date  . Acute MI, inferior wall ([place]) 2011  . Amputation finger    right first finger top portion age 56  . Aortic atherosclerosis ([place])   . ARF (acute renal failure) ([place]) 01/24/2015  . CHF (congestive heart failure) ([place])   . CKD (chronic kidney disease), stage III ([place]) 11/2015  . Coronary artery disease cardioloigst-  dr Stanford Breed   hx  inferior MI 10-08-2009 emergency CABG x4  . DOE (dyspnea on exertion)    since starting chemo  . DVT (deep venous thrombosis) ([place]) 11/05/2016   right proximal-mid peroneal veins  . Fracture of left hip ([place])   . H/O hyperkalemia   . History of acute inferior wall myocardial infarction 10/08/2009  . History of blood transfusion   . History of chemotherapy   . History of echocardiogram 03/2012   EF 45%, LAE, Mild MR, no residual VSD  . History of first degree heart block   . History of metabolic acidosis   . History of thrombocytopenia   . History of urethral stricture   . History of UTI   . Hyperlipidemia   . Hypertension   . Ischemic cardiomyopathy   . Kidney cysts    bilateral  . Multiple pulmonary nodules    Bilateral scattered  . Multiple thyroid nodules   . Obstructed, uropathy   . Obstructive uropathy   . Pancreatic carcinoma ([facility])    s/p SBRT  . Pneumonia    Childhood  . Prostate cancer ([place])   . S/P VSD repair    09/2009  . Systolic CHF, chronic ([place])   . VSD (ventricular septal defect) 09/2009   acute after MI   Social History   Socioeconomic History  . Marital status: Divorced    Spouse name: Not on file  . Number of children: Not on file  . Years of education: Not on file  . Highest education level: Not on file  Occupational History  . Not on file  Social Needs  . Financial resource strain: Not on file  . Food insecurity:    Worry: Not on file    Inability: Not on file  . Transportation needs:    Medical: Not on file    Non-medical: Not on file  Tobacco Use  . Smoking status: Former Smoker    Packs/day: 1.50    Years: 45.00    Pack years: 67.50    Types: Cigarettes    Last attempt to quit: 01/13/2010    Years since quitting: 7.9  . Smokeless tobacco: Never Used  Substance and Sexual Activity  . Alcohol use: No    Comment: occasional   . Drug use: No  . Sexual activity: Not on file  Lifestyle  . Physical activity:    Days per week: Not on  file    Minutes per session: Not on file  . Stress: Not on file  Relationships  . Social connections:    Talks on phone: Not on file    Gets together: Not on file    Attends religious service: Not  on file    Active member of club or organization: Not on file    Attends meetings of clubs or organizations: Not on file    Relationship status: Not on file  Other Topics Concern  . Not on file  Social History Narrative  . Not on file   Family History  Problem Relation Age of Onset  . Cancer Paternal Aunt        unknown type cancer   . Cancer Paternal Uncle        unknown type cancer   Scheduled Meds: . aspirin EC  81 mg Oral Daily  . carvedilol  12.5 mg Oral BID WC  . megestrol  400 mg Oral Daily  . rosuvastatin  40 mg Oral Daily  . sodium bicarbonate  650 mg Oral BID  . tamsulosin  0.4 mg Oral QPC supper   Continuous Infusions: . sodium chloride 75 mL/hr at 12/31/17 1814  . sodium chloride     PRN Meds:.acetaminophen, ondansetron **OR** ondansetron (ZOFRAN) IV Medications Prior to Admission:  Prior to Admission medications   Medication Sig Start Date End Date Taking? Authorizing Provider  amLODipine (NORVASC) 10 MG tablet take 1/2 tablet by mouth once daily Patient taking differently: Take 5 mg by mouth daily.  03/05/17  Yes Lelon Perla, MD  carvedilol (COREG) 12.5 MG tablet Take 1 tablet (12.5 mg total) by mouth 2 (two) times daily with a meal. 11/13/17  Yes Crenshaw, Denice Bors, MD  rosuvastatin (CRESTOR) 40 MG tablet Take 1 tablet (40 mg total) by mouth daily. 11/13/17  Yes Lelon Perla, MD  tamsulosin (FLOMAX) 0.4 MG CAPS capsule Take 1 capsule (0.4 mg total) by mouth daily after supper. 10/23/17  Yes Bruning, Ashlyn, PA-C  aspirin EC 81 MG tablet Take 1 tablet (81 mg total) by mouth daily. 09/24/17   Lelon Perla, MD  cephALEXin (KEFLEX) 500 MG capsule Take 1 capsule (500 mg total) by mouth 4 (four) times daily. Patient not taking: Reported on 12/27/2017 10/12/17    Molpus, Jenny Reichmann, MD   Allergies  Allergen Reactions  . Chlorhexidine Gluconate Other (See Comments)    Red skin and flaked skin    Review of Systems  Constitutional: Positive for activity change, appetite change and fatigue.  Neurological: Positive for weakness.  Psychiatric/Behavioral: Positive for sleep disturbance.   Physical Exam  General: Alert, awake, in no acute distress but appears tired and chronically ill-appearing HEENT: No bruits, no goiter, no JVD Heart: Regular rate and rhythm. No murmur appreciated. Lungs: Good air movement, clear Abdomen: Soft, nontender, nondistended, positive bowel sounds.  Ext: No significant edema  Vital Signs: BP 98/68 (BP Location: Right Arm)   Pulse 69   Temp 98.3 F (36.8 C) (Oral)   Resp 18   Ht '5\' 7"'$  (1.702 m)   Wt 83.2 kg   SpO2 98%   BMI 28.73 kg/m  Pain Scale: 0-10   Pain Score: 3    SpO2: SpO2: 98 % O2 Device:SpO2: 98 % O2 Flow Rate: .O2 Flow Rate (L/min): 2 L/min  IO: Intake/output summary:   Intake/Output Summary (Last 24 hours) at 12/31/2017 1908 Last data filed at 12/31/2017 1431 Gross per 24 hour  Intake 1080 ml  Output 1376 ml  Net -296 ml    LBM: Last BM Date: 12/30/17 Baseline Weight: Weight: 83.2 kg Most recent weight: Weight: 83.2 kg     Palliative Assessment/Data:   Flowsheet Rows     Most Recent Value  Intake Tab  Referral Department  Hospitalist  Unit at Time of Referral  Oncology Unit  Palliative Care Primary Diagnosis  Cancer  Date Notified  12/31/17  Palliative Care Type  New Palliative care  Reason for referral  Clarify Goals of Care  Date of Admission  12/27/17  # of days IP prior to Palliative referral  4  Clinical Assessment  Psychosocial & Spiritual Assessment  Palliative Care Outcomes      Time In: 1500 Time Out: 1600 Time Total: 60 Greater than 50%  of this time was spent counseling and coordinating care related to the above assessment and plan.  Signed by: Micheline Rough,  MD   Please contact Palliative Medicine Team phone at [phone] for questions and concerns.  For individual provider: See Shea Evans

## 2017-12-31 NOTE — Progress Notes (Signed)
[name]   DOB:07-03-1945   WN#:[id number]   [id number]#:[id number]  Oncology f/u   Subjective: Patient complains of extreme fatigue and weakness, not able to walk around independently.  He states his abdominal pain is controlled, his appetite is very poor, no other new complaints.  Objective:  Vitals:   12/31/17 0610 12/31/17 1405  BP: 124/82 98/68  Pulse: 64 69  Resp: 16 18  Temp: 99.4 F (37.4 C) 98.3 F (36.8 C)  SpO2: 96% 98%    Body mass index is 28.73 kg/m.  Intake/Output Summary (Last 24 hours) at 12/31/2017 1800 Last data filed at 12/31/2017 1431 Gross per 24 hour  Intake 1320 ml  Output 1876 ml  Net -556 ml      Oropharynx clear  No peripheral adenopathy  Lungs clear -- no rales or rhonchi  Heart regular rate and rhythm  Abdomen benign  MSK no focal spinal tenderness, no peripheral edema  Neuro nonfocal   CBG (last 3)  No results for input(s): GLUCAP in the last 72 hours.   Labs:  Lab Results  Component Value Date   WBC 5.6 12/31/2017   HGB 9.1 (L) 12/31/2017   HCT 29.2 (L) 12/31/2017   MCV 84.1 12/31/2017   PLT 238 12/31/2017   NEUTROABS 2.0 12/30/2017   CMP Latest Ref Rng & Units 12/31/2017 12/30/2017 12/29/2017  Glucose 70 - 99 mg/dL 95 145(H) 138(H)  BUN 8 - 23 mg/dL 50(H) 44(H) 45(H)  Creatinine 0.61 - 1.24 mg/dL 4.55(H) 4.37(H) 4.27(H)  Sodium 135 - 145 mmol/L 132(L) 136 138  Potassium 3.5 - 5.1 mmol/L 4.0 4.1 3.8  Chloride 98 - 111 mmol/L 104 109 112(H)  CO2 22 - 32 mmol/L 15(L) 14(L) 17(L)  Calcium 8.9 - 10.3 mg/dL 9.3 9.8 9.1  Total Protein 6.5 - 8.1 g/dL - 7.8 7.2  Total Bilirubin 0.3 - 1.2 mg/dL - 2.6(H) 5.2(H)  Alkaline Phos 38 - 126 U/L - 511(H) 512(H)  AST 15 - 41 U/L - 102(H) 129(H)  ALT 0 - 44 U/L - 144(H) 145(H)     Urine Studies No results for input(s): UHGB, CRYS in the last 72 hours.  Invalid input(s): UACOL, UAPR, USPG, UPH, UTP, UGL, UKET, UBIL, UNIT, UROB, [place], UEPI, UWBC, Duwayne Heck [place], [place]  Basic  Metabolic Panel: Recent Labs  Lab 12/27/17 1537 12/28/17 0506 12/29/17 0601 12/30/17 0332 12/31/17 1121  NA 135 137 138 136 132*  K 4.0 4.1 3.8 4.1 4.0  CL 110 112* 112* 109 104  CO2 14* 14* 17* 14* 15*  GLUCOSE 111* 101* 138* 145* 95  BUN 52* 50* 45* 44* 50*  CREATININE 4.01* 4.11* 4.27* 4.37* 4.55*  CALCIUM 9.5 9.4 9.1 9.8 9.3   GFR Estimated Creatinine Clearance: 14.7 mL/min (A) (by C-G formula based on SCr of 4.55 mg/dL (H)). Liver Function Tests: Recent Labs  Lab 12/27/17 1537 12/28/17 0506 12/29/17 0601 12/30/17 0332  AST 111* 102* 129* 102*  ALT 148* 136* 145* 144*  ALKPHOS 520* 480* 512* 511*  BILITOT 6.2* 5.6* 5.2* 2.6*  PROT 7.8 7.5 7.2 7.8  ALBUMIN 3.3* 3.0* 2.8* 2.9*   Recent Labs  Lab 12/27/17 1537  LIPASE 22   No results for input(s): AMMONIA in the last 168 hours. Coagulation profile Recent Labs  Lab 12/29/17 0601  INR 1.21    CBC: Recent Labs  Lab 12/27/17 1537 12/28/17 0506 12/29/17 0601 12/30/17 0332 12/31/17 1121  WBC 4.3 4.8 4.3 2.5* 5.6  NEUTROABS  --   --   --  2.0  --   HGB 8.4* 8.2* 8.1* 8.8* 9.1*  HCT 26.8* 26.2* 25.8* 28.3* 29.2*  MCV 86.2 87.0 84.3 85.5 84.1  PLT 268 268 249 226 238   Cardiac Enzymes: No results for input(s): CKTOTAL, CKMB, CKMBINDEX, TROPONINI in the last 168 hours. BNP: Invalid input(s): POCBNP CBG: No results for input(s): GLUCAP in the last 168 hours. D-Dimer No results for input(s): DDIMER in the last 72 hours. Hgb A1c No results for input(s): HGBA1C in the last 72 hours. Lipid Profile No results for input(s): CHOL, HDL, LDLCALC, TRIG, CHOLHDL, LDLDIRECT in the last 72 hours. Thyroid function studies No results for input(s): TSH, T4TOTAL, T3FREE, THYROIDAB in the last 72 hours.  Invalid input(s): FREET3 Anemia work up Recent Labs    12/29/17 0923  VITAMINB12 1,093*  FOLATE 8.3  FERRITIN 450*  TIBC 250  IRON 26*   Microbiology Recent Results (from the past 240 hour(s))  Culture,  blood (routine x 2)     Status: None (Preliminary result)   Collection Time: 12/29/17 12:10 AM  Result Value Ref Range Status   Specimen Description   Final    BLOOD RIGHT HAND Performed at [facility], [place] [address] [place], [place] [address]    Special Requests   Final    BOTTLES DRAWN AEROBIC ONLY Blood Culture adequate volume Performed at [facility] [address], [place], [place] [address]    Culture   Final    NO GROWTH 2 DAYS Performed at [facility] [address] [place], [place] [address]    Report Status PENDING  Incomplete  Culture, blood (routine x 2)     Status: None (Preliminary result)   Collection Time: 12/29/17 12:10 AM  Result Value Ref Range Status   Specimen Description   Final    BLOOD RIGHT WRIST Performed at [facility] [address] [place], [place] [address]    Special Requests   Final    BOTTLES DRAWN AEROBIC AND ANAEROBIC Blood Culture adequate volume Performed at [facility] [address] [place], [place] [address]    Culture   Final    NO GROWTH 2 DAYS Performed at [facility] [address] [place], [place] [address]    Report Status PENDING  Incomplete  Culture, Urine     Status: None   Collection Time: 12/29/17 11:37 PM  Result Value Ref Range Status   Specimen Description   Final    URINE, CLEAN CATCH Performed at [facility], [name] [address] [place], [place] [address]    Special Requests   Final    NONE Performed at [facility], [place] [address], [place], [place] [address]    Culture   Final    NO GROWTH Performed at [facility], [place] [address] [place], [place] [address]    Report Status 12/31/2017 FINAL  Final      Studies:  No results found.  Assessment: 72 y.o. with past medical history of borderline resectable pancreatic cancer and prostate cancer in June 2018, CKD,  coronary artery disease status post CABG, hypertension, presented to ED with 2 days of bilateral lower abdominal pain, nausea and vomiting.  1.  Obstructive jaundice, secondary to progression of his pancreatic cancer, status post MRCP and stent placement 2.  Borderline resectable pancreatic cancer, diagnosed in June 2018, status post brief chemo and radiation, lost f/u after 03/2017  3.  Acute on chronic renal failure, stable  4.  Coronary artery disease status post CABG 5.  Prostate cancer, status post TURP and radiation  6.  Deconditioning    Plan:  -Pt complains of extreme fatigue, not able to walk independently.  -Due to his worsening performance status, multiple comorbidities, I do not think he is a candidate for pancreatic surgery or chemotherapy. Pt did not pursue pancreatic surgery last year after chemo and radiation. -I discussed hospice with patient, which is probably the better home care service for him. I will consult palliative care service to help his transition, he is open to the discussion, although he did not tell me if he wants to not. He wants to talk to his sons also.  -If he is going to SNF, hospice can f/u him also  -I will f/u as needed   Truitt Merle, MD 12/31/2017  6:00 PM

## 2017-12-31 NOTE — Progress Notes (Signed)
Patient Demographics:    [name], is a 72 y.o. male, DOB - December 17, 1945, KDX:[id number]  Admit date - 12/27/2017   Admitting Physician Lenore Cordia, MD  Outpatient Primary MD for the patient is Patient, No Pcp Per  LOS - 4   Chief Complaint  Patient presents with  . Abdominal Pain        Subjective:    [name] today has no fevers, no emesis,  No chest pain, complains of fatigue and poor appetite.... Patient remains very weak and tired, can barely make it to the chair from the bed even with maximum assist of 1  Assessment  & Plan :    Principal Problem:   Obstructive jaundice due to cancer [facility]) Active Problems:   HYPERTENSION, BENIGN   Coronary atherosclerosis of native coronary artery   Hyperlipidemia   Malignant neoplasm of prostate ([place])   Pancreatic cancer ([place])   CKD (chronic kidney disease), stage IV ([place])   ARF (acute renal failure) ([place])  Brief Summary 72 year old gentleman history of pancreatic cancer status post SBRT, prostate cancer status post TURP and XRT, coronary artery disease status post CABG, chronic kidney disease stage IV, VSD status post repair, hypertension hyperlipidemia presented to the ED with 2-day history of bilateral lower abdominal pain with nausea and emesis, admitted 12/27/2017 with obstructive jaundice secondary to underlying pancreatic malignancy   Plan:- 1) obstructive jaundice in the setting of pancreatic Cancer s/p SBRT--- status post ERCP and stent placement on 12/29/2017, bilirubin trending down per Dr. Burr Medico possible discharge on 12/31/2017, ???  Poor candidate for surgical treatment, get palliative care consult to determine goals of care,  2)AKI----acute kidney injury on CKD stage - IV due to nausea vomiting and volume depletion,   creatinine on admission= 4.0  ,   baseline creatinine = 3.38 (09/2017)   , creatinine is now= 4. 55    , renally adjust  medications, avoid nephrotoxic agents/dehydration/hypotension , overall renal function is worsening further, creatinine is up to 4.5, give gentle IV hydration  3)CAD/S/p CABG and HTN--- stable, continue Coreg, also continue aspirin and Crestor  4)History of prostate cancer- Status post TURP and XRT.  Continue Flomax.  Outpatient follow-up.  5)FEN/Anorexia--- oral intake remains very poor, patient has no appetite, barely tolerating soft food, give Megace for appetite stimulation  NB!!! Physical Therapist recommends SNF rehab for patient, patient is reluctant to go to SNF for rehab, he thinks he may go home with home health and consider palliative care and hospice services given limited options for treating his underlying malignancy, patient would like to discuss case further with his 2 sons, he will decide on 01/01/2018 after speaking with his sons tonight if he is going to SNF or going home with home health and possibly hospice on 01/01/18  Disposition/Need for in-Hospital Stay- patient unable to be discharged at this time due to poor oral intake and worsening renal function, also need SNF palcement,  overall renal function is worsening further, creatinine is up to 4.5, give gentle IV hydration   code Status : FULL   Disposition Plan  :   possible discharge on 01/01/2018 to SNF Vs Home with HH  Consults  :  Gi and Oncology  DVT prophylaxis: SCDs Code  Status: Full Family Communication: Updated patient.  No family at bedside. Disposition Plan: To be determined.   Consultants:   Gastroenterology: Dr. Cristina Gong 12/28/2017  Oncology: Dr. Burr Medico 12/28/2017  Procedures:   CT abdomen and pelvis 12/27/2017  Renal ultrasound 12/28/2017  Right upper quadrant ultrasound 12/27/2017  ERCP with endoscopic stent placement into the biliary or pancreatic duct by Dr. Watt Climes 12/29/2017  DVT Prophylaxis  :    SCDs   Lab Results  Component Value Date   PLT 238 12/31/2017    Inpatient  Medications  Scheduled Meds: . aspirin EC  81 mg Oral Daily  . carvedilol  12.5 mg Oral BID WC  . rosuvastatin  40 mg Oral Daily  . sodium bicarbonate  650 mg Oral BID  . tamsulosin  0.4 mg Oral QPC supper   Continuous Infusions: . sodium chloride    . sodium chloride     PRN Meds:.acetaminophen, ondansetron **OR** ondansetron (ZOFRAN) IV    Anti-infectives (From admission, onward)   None        Objective:   Vitals:   12/30/17 1806 12/30/17 2231 12/31/17 0610 12/31/17 1405  BP:  (!) 136/95 124/82 98/68  Pulse: 60 62 64 69  Resp:  20 16 18   Temp:  99.1 F (37.3 C) 99.4 F (37.4 C) 98.3 F (36.8 C)  TempSrc:  Oral Oral Oral  SpO2:  100% 96% 98%  Weight:      Height:        Wt Readings from Last 3 Encounters:  12/30/17 83.2 kg  10/23/17 91.9 kg  10/12/17 91.2 kg     Intake/Output Summary (Last 24 hours) at 12/31/2017 1707 Last data filed at 12/31/2017 1431 Gross per 24 hour  Intake 1320 ml  Output 1876 ml  Net -556 ml     Physical Exam Patient is examined daily including today on 12/31/17 , exams remain the same as of yesterday except that has changed   Gen:- Awake Alert, looks ill and tired HEENT:- [place].AT, No sclera icterus Neck-Supple Neck,No JVD,.  Lungs-  CTAB , fair symmetrical air movement CV- S1, S2 normal, regular , CABG scar Abd-  +ve B.Sounds, Abd Soft, No tenderness,    Extremity/Skin:-   pedal pulses present  Psych-affect is appropriate, oriented x3 Neuro-no new focal deficits, no tremors, Patient remains very weak and tired, can barely make it to the chair from the bed even with maximum assist of 1    Data Review:   Micro Results Recent Results (from the past 240 hour(s))  Culture, blood (routine x 2)     Status: None (Preliminary result)   Collection Time: 12/29/17 12:10 AM  Result Value Ref Range Status   Specimen Description   Final    BLOOD RIGHT HAND Performed at [facility] [address]  [place], [place] [address]    Special Requests   Final    BOTTLES DRAWN AEROBIC ONLY Blood Culture adequate volume Performed at [facility] [address], [place], [place] [address]    Culture   Final    NO GROWTH 2 DAYS Performed at [facility] [address] [place], [place] [address]    Report Status PENDING  Incomplete  Culture, blood (routine x 2)     Status: None (Preliminary result)   Collection Time: 12/29/17 12:10 AM  Result Value Ref Range Status   Specimen Description   Final    BLOOD RIGHT WRIST Performed at [facility], [place] [address] [place], [place] [address]    Special Requests   Final    BOTTLES DRAWN AEROBIC AND ANAEROBIC Blood Culture adequate volume Performed at [facility] [address] [place], [place] [address]    Culture   Final    NO GROWTH 2 DAYS Performed at [facility] [address], [place], [place] [address]    Report Status PENDING  Incomplete  Culture, Urine     Status: None   Collection Time: 12/29/17 11:37 PM  Result Value Ref Range Status   Specimen Description   Final    URINE, CLEAN CATCH Performed at [facility], [place] [address] [place], [place] [address]    Special Requests   Final    NONE Performed at [facility], [place] [address], [place], [place] [address]    Culture   Final    NO GROWTH Performed at [facility], [place] [address], [place], [place] [address]    Report Status 12/31/2017 FINAL  Final    Radiology Reports Ct Abdomen Pelvis Wo Contrast  Result Date: 12/27/2017 CLINICAL DATA:  Low abdominal pain with bloating for 2 days. No appetite. History of pancreatic cancer. Elevated liver function studies. EXAM: CT ABDOMEN AND PELVIS WITHOUT CONTRAST TECHNIQUE: Multidetector CT imaging of the abdomen and pelvis was performed following the standard protocol without IV contrast. COMPARISON:  Abdominopelvic CT  10/05/2017. FINDINGS: Lower chest: Stable mild chronic central airway thickening in both lung bases. The heart is enlarged with a chronic calcified aneurysm involving inferior wall left ventricle. No significant pleural or pericardial effusion. Hepatobiliary: No focal hepatic lesions are identified on noncontrast imaging. However, there is new intrahepatic biliary dilatation with increased extrahepatic biliary dilatation. The gallbladder appears unremarkable. Pancreas: Ill-defined enlargement of the pancreatic head is grossly stable, suboptimally evaluated on this noncontrast study. There is a stable surgical clip or fiduciary marker at the pancreatic neck. There is stable pancreatic atrophy without significant ductal dilatation or surrounding inflammatory change. Spleen: Normal in size without focal abnormality. Adrenals/Urinary Tract: Both adrenal glands appear normal. The kidneys appears similar to the previous study. There is bilateral renal cortical thinning with low-density and hyperdense lesions bilaterally. There is mild chronic collecting system dilatation and wall thickening. There is moderate diffuse bladder wall thickening. Stomach/Bowel: No evidence of bowel wall thickening, distention or surrounding inflammatory change. The appendix appears normal. Vascular/Lymphatic: There is increased low-density in the porta hepatis, difficult to differentiate from the portal vein and bile duct on this noncontrast study, although suspicious for worsening portacaval adenopathy given the worsening biliary dilatation. This measures up to 16 mm short axis on image 22. There are no enlarged retroperitoneal or pelvic lymph nodes. Diffuse aortic and branch vessel atherosclerosis. Reproductive: Radiation clips in the prostate gland and mild enlargement of the gland are stable. Other: No ascites or peritoneal nodularity. Musculoskeletal: No acute or significant osseous findings. Chronic biconcave deformities in the lower  lumbar spine are stable. No evidence of osseous metastatic disease. Stable lipomas in the external oblique muscles of the abdominal wall bilaterally. IMPRESSION: 1. New intrahepatic and progressive extrahepatic biliary dilatation consistent with obstructive jaundice from local recurrence of pancreatic cancer or nodal adenopathy in the porta hepatis. Biliary decompression recommended. 2. No hepatic metastases identified on noncontrast imaging. No ascites or peritoneal nodularity. 3. Stable chronic mild renal collecting system dilatation and wall thickening without secondary signs of acute obstruction. Chronic bladder wall thickening. 4. Chronic calcified  left ventricular aneurysm. Electronically Signed   By: Richardean Sale M.D.   On: 12/27/2017 18:00   US Renal  Result Date: 12/28/2017 CLINICAL DATA:  Acute renal insufficiency. EXAM: RENAL / URINARY TRACT ULTRASOUND COMPLETE COMPARISON:  None. FINDINGS: Right Kidney: Renal measurements: 10.6 x 5.7 x 4.7 cm = volume: 135.2 mL . Echogenicity within normal limits. No mass or hydronephrosis visualized. Left Kidney: Renal measurements: 8.5 x 4.8 x 3.2 cm = volume: 68.4 mL. Contains 2 cysts with the largest measuring 1.7 cm. Bladder: Appears normal for degree of bladder distention. IMPRESSION: 1. No cause for acute renal failure identified.  Left renal cysts. Electronically Signed   By: Dorise Bullion III M.D   On: 12/28/2017 10:53   Dg Ercp Biliary & Pancreatic Ducts  Result Date: 12/29/2017 CLINICAL DATA:  Pancreatic mass, now with biliary ductal dilatation, post ERCP and biliary stent placement. EXAM: ERCP TECHNIQUE: Multiple spot images obtained with the fluoroscopic device and submitted for interpretation post-procedure. FLUOROSCOPY TIME:  2 minutes, 42 seconds (46.3 mGy) COMPARISON:  CT abdomen pelvis-12/27/2017; 10/05/2017 FINDINGS: 7 spot fluoroscopic images of the right upper abdominal quadrant during ERCP are provided for review Initial image  demonstrates an ERCP probe overlying the right upper abdominal quadrant. Surgical clips overlie the midline of the abdomen. There is faint opacification of the central intrahepatic biliary system which appears mildly dilated. There is minimal opacification of the cystic duct. Subsequent image demonstrates a long segment moderate length narrowing of the CBD (image 2). Subsequent images demonstrate placement of an internal metallic stent within distal aspect of the CBD, traversing the long segment CBD narrowing. IMPRESSION: ERCP with metallic biliary stent placement as above. These images were submitted for radiologic interpretation only. Please see the procedural report for the amount of contrast and the fluoroscopy time utilized. Electronically Signed   By: Sandi Mariscal M.D.   On: 12/29/2017 12:52   US Abdomen Limited Ruq  Result Date: 12/27/2017 CLINICAL DATA:  Right upper quadrant pain for 3 days. EXAM: ULTRASOUND ABDOMEN LIMITED RIGHT UPPER QUADRANT COMPARISON:  None. FINDINGS: Gallbladder: No gallstones or wall thickening visualized. No sonographic Murphy sign noted by sonographer. Common bile duct: Diameter: 0.8 cm. Liver: No focal lesion identified. Within normal limits in parenchymal echogenicity. Portal vein is patent on color Doppler imaging with normal direction of blood flow towards the liver. IMPRESSION: Negative exam. Electronically Signed   By: Inge Rise M.D.   On: 12/27/2017 17:35     CBC Recent Labs  Lab 12/27/17 1537 12/28/17 0506 12/29/17 0601 12/30/17 0332 12/31/17 1121  WBC 4.3 4.8 4.3 2.5* 5.6  HGB 8.4* 8.2* 8.1* 8.8* 9.1*  HCT 26.8* 26.2* 25.8* 28.3* 29.2*  PLT 268 268 249 226 238  MCV 86.2 87.0 84.3 85.5 84.1  MCH 27.0 27.2 26.5 26.6 26.2  MCHC 31.3 31.3 31.4 31.1 31.2  RDW 17.8* 18.1* 17.8* 17.9* 17.6*  LYMPHSABS  --   --   --  0.3*  --   MONOABS  --   --   --  0.2  --   EOSABS  --   --   --  0.0  --   BASOSABS  --   --   --  0.0  --     Chemistries    Recent Labs  Lab 12/27/17 1537 12/28/17 0506 12/29/17 0601 12/30/17 0332 12/31/17 1121  NA 135 137 138 136 132*  K 4.0 4.1 3.8 4.1 4.0  CL 110 112* 112* 109 104  CO2  14* 14* 17* 14* 15*  GLUCOSE 111* 101* 138* 145* 95  BUN 52* 50* 45* 44* 50*  CREATININE 4.01* 4.11* 4.27* 4.37* 4.55*  CALCIUM 9.5 9.4 9.1 9.8 9.3  AST 111* 102* 129* 102*  --   ALT 148* 136* 145* 144*  --   ALKPHOS 520* 480* 512* 511*  --   BILITOT 6.2* 5.6* 5.2* 2.6*  --    ------------------------------------------------------------------------------------------------------------------ No results for input(s): CHOL, HDL, LDLCALC, TRIG, CHOLHDL, LDLDIRECT in the last 72 hours.  Lab Results  Component Value Date   HGBA1C (H) 10/08/2009    6.1 (NOTE)                                                                       According to the ADA Clinical Practice Recommendations for 2011, when HbA1c is used as a screening test:   >=6.5%   Diagnostic of Diabetes Mellitus           (if abnormal result  is confirmed)  5.7-6.4%   Increased risk of developing Diabetes Mellitus  References:Diagnosis and Classification of Diabetes Mellitus,Diabetes Care,2011,34(Suppl 1):S62-S69 and Standards of Medical Care in         Diabetes - 2011,Diabetes PPIR,5188,41  (Suppl 1):S11-S61.   ------------------------------------------------------------------------------------------------------------------ No results for input(s): TSH, T4TOTAL, T3FREE, THYROIDAB in the last 72 hours.  Invalid input(s): FREET3 ------------------------------------------------------------------------------------------------------------------ Recent Labs    12/29/17 0923  VITAMINB12 1,093*  FOLATE 8.3  FERRITIN 450*  TIBC 250  IRON 26*    Coagulation profile Recent Labs  Lab 12/29/17 0601  INR 1.21   -----------------------------------------------------------------------------------------------------------------    Component Value Date/Time   BNP  100.7 (H) 11/17/2015 6606     Roxan Hockey M.D on 12/31/2017 at 5:07 PM  Pager---[phone] Go to [email or web address] - password [email or web address] for contact info  [facility] - Office  [phone]

## 2017-12-31 NOTE — Telephone Encounter (Signed)
Scheduled appt per 12/18 sch message - pt is aware of appt - reminder letter sent in the mail.

## 2017-12-31 NOTE — Progress Notes (Signed)
Met with pt to discuss disposition plan. Pt reports at home (where he resides with 2 of his sons) he ambulates with a cane. Aware SNF is recommended however he feels given he is facing possibly not having further options to treat his cancer, he wants to recuperate from hospitalization at home over next few weeks and discuss future plans with his sons.  Pt has used home health in the past and is requesting home health therapy.  Discussed with attending, plan for pt to DC likely tomorrow with support of sons and home health.  Sharren Bridge, MSW, LCSW Clinical Social Work 12/31/2017 [phone]

## 2017-12-31 NOTE — Evaluation (Signed)
Physical Therapy Evaluation Patient Details Name: [name] MRN: [id number] DOB: [birth year] Today's Date: 12/31/2017   History of Present Illness   72 year old gentleman history of pancreatic cancer status post SBRT, prostate cancer status post TURP and XRT, coronary artery disease status post CABG, chronic kidney disease stage IV, VSD status post repair, hypertension hyperlipidemia and admitted for obstructive jaundice in the setting of history of pancreatic cancer and s/p ERCP  Clinical Impression  Pt admitted with above diagnosis. Pt currently with functional limitations due to the deficits listed below (see PT Problem List).  Pt will benefit from skilled PT to increase their independence and safety with mobility to allow discharge to the venue listed below.  Pt reports dizziness with positional changes however has not been OOB since Sunday (per his report).  Pt encouraged OOB to recliner.  Pt lives with 2 sons however they are working.  Recommend SNF upon d/c at this time.      Follow Up Recommendations SNF    Equipment Recommendations  Rolling walker with 5" wheels    Recommendations for Other Services       Precautions / Restrictions Precautions Precautions: Fall Restrictions Weight Bearing Restrictions: No      Mobility  Bed Mobility Overal bed mobility: Needs Assistance Bed Mobility: Supine to Sit     Supine to sit: Min guard;HOB elevated     General bed mobility comments: incr time and effort  Transfers Overall transfer level: Needs assistance Equipment used: Rolling walker (2 wheeled) Transfers: Sit to/from [facility] Sit to Stand: Min assist Stand pivot transfers: Min assist       General transfer comment: pt reports moderate dizzy which resolved after sitting a minute, pt attempted standing however returned to sitting due to dizziness, pt encouraged OOB and over to recliner, once in recliner resting dizziness  improved  Ambulation/Gait             General Gait Details: pt declined today, agreeable to recliner after encouragement  Stairs            Wheelchair Mobility    Modified Rankin (Stroke Patients Only)       Balance Overall balance assessment: Needs assistance         Standing balance support: Bilateral upper extremity supported Standing balance-Leahy Scale: Poor Standing balance comment: requires UE support                             Pertinent Vitals/Pain Pain Assessment: No/denies pain    Home Living Family/patient expects to be discharged to:: Private residence Living Arrangements: Children Available Help at Discharge: Family;Available PRN/intermittently Type of Home: Apartment Home Access: Stairs to enter   Entrance Stairs-Number of Steps: a couple Home Layout: One level Home Equipment: Kasandra Knudsen - single point      Prior Function Level of Independence: Independent with assistive device(s)         Comments: uses SPC     Hand Dominance        Extremity/Trunk Assessment   Upper Extremity Assessment Upper Extremity Assessment: Generalized weakness    Lower Extremity Assessment Lower Extremity Assessment: Generalized weakness       Communication   Communication: No difficulties  Cognition Arousal/Alertness: Awake/alert Behavior During Therapy: Flat affect Overall Cognitive Status: Within Functional Limits for tasks assessed  General Comments      Exercises     Assessment/Plan    PT Assessment Patient needs continued PT services  PT Problem List Decreased strength;Decreased mobility;Decreased knowledge of use of DME;Decreased balance;Decreased activity tolerance       PT Treatment Interventions DME instruction;Functional mobility training;Balance training;Gait training;Therapeutic activities;Therapeutic exercise;Patient/family education    PT Goals (Current  goals can be found in the Care Plan section)  Acute Rehab PT Goals PT Goal Formulation: With patient Time For Goal Achievement: 01/07/18 Potential to Achieve Goals: Good    Frequency Min 2X/week   Barriers to discharge        Co-evaluation               AM-PAC PT "6 Clicks" Mobility  Outcome Measure Help needed turning from your back to your side while in a flat bed without using bedrails?: A Little Help needed moving from lying on your back to sitting on the side of a flat bed without using bedrails?: A Little Help needed moving to and from a bed to a chair (including a wheelchair)?: A Little Help needed standing up from a chair using your arms (e.g., wheelchair or bedside chair)?: A Little Help needed to walk in hospital room?: A Lot Help needed climbing 3-5 steps with a railing? : Total 6 Click Score: 15    End of Session Equipment Utilized During Treatment: Gait belt Activity Tolerance: Patient limited by fatigue Patient left: in chair;with call bell/phone within reach Nurse Communication: Mobility status PT Visit Diagnosis: Other abnormalities of gait and mobility (R26.89)    Time: 7482-7078 PT Time Calculation (min) (ACUTE ONLY): 17 min   Charges:   PT Evaluation $PT Eval Low Complexity: [place], PT, DPT Acute Rehabilitation Services Office: [phone] Pager: [phone]  Trena Platt 12/31/2017, 1:01 PM

## 2018-01-01 LAB — BASIC METABOLIC PANEL
Anion gap: 16 — ABNORMAL HIGH (ref 5–15)
BUN: 56 mg/dL — ABNORMAL HIGH (ref 8–23)
CO2: 14 mmol/L — ABNORMAL LOW (ref 22–32)
Calcium: 9.1 mg/dL (ref 8.9–10.3)
Chloride: 103 mmol/L (ref 98–111)
Creatinine, Ser: 4.6 mg/dL — ABNORMAL HIGH (ref 0.61–1.24)
GFR calc Af Amer: 14 mL/min — ABNORMAL LOW (ref >60)
GFR calc non Af Amer: 12 mL/min — ABNORMAL LOW (ref >60)
Glucose, Bld: 107 mg/dL — ABNORMAL HIGH (ref 70–99)
Potassium: 3.7 mmol/L (ref 3.5–5.1)
SODIUM: 133 mmol/L — AB (ref 135–145)

## 2018-01-01 MED ORDER — MEGESTROL ACETATE 400 MG/10ML PO SUSP: 400.0000 mg | Freq: Every day | ORAL | 4 refills | Status: AC

## 2018-01-01 MED ORDER — OXYCODONE HCL 5 MG PO TABS: 5.0000 mg | ORAL_TABLET | ORAL | 0 refills | Status: DC | PRN

## 2018-01-01 MED ORDER — TAMSULOSIN HCL 0.4 MG PO CAPS: 0.4000 mg | ORAL_CAPSULE | Freq: Every day | ORAL | 5 refills | Status: AC

## 2018-01-01 MED ORDER — ONDANSETRON HCL 4 MG PO TABS: 4.0000 mg | ORAL_TABLET | Freq: Four times a day (QID) | ORAL | 0 refills | Status: DC | PRN

## 2018-01-01 MED ORDER — AMLODIPINE BESYLATE 10 MG PO TABS: 5.0000 mg | ORAL_TABLET | Freq: Every day | ORAL | 11 refills | Status: DC

## 2018-01-01 MED ORDER — SODIUM BICARBONATE 650 MG PO TABS: 650.0000 mg | ORAL_TABLET | Freq: Two times a day (BID) | ORAL | 0 refills | Status: AC

## 2018-01-01 NOTE — Progress Notes (Signed)
Reviewed discharge paperwork with patient and son. Went over changes to medications and regimens. Patient discharged in care of family via wheelchair by nurse tech.

## 2018-01-01 NOTE — Discharge Instructions (Signed)
1)Avoid ibuprofen/Advil/Aleve/Motrin/Goody Powders/Naproxen/BC powders/Meloxicam/Diclofenac/Indomethacin and other Nonsteroidal anti-inflammatory medications as these will make you more likely to bleed and can cause stomach ulcers, can also cause Kidney problems.   2) encourage increased oral intake including nutritional supplements  3) please consider palliative care/hospice  Enrollment  4) keep your appointment with your oncologist

## 2018-01-01 NOTE — Discharge Summary (Signed)
[name], is a 72 y.o. male  DOB June 23, 1945  MRN [id number].  Admission date:  12/27/2017  Admitting Physician  Lenore Cordia, MD  Discharge Date:  01/01/2018   Primary MD  Patient, No Pcp Per  Recommendations for primary care physician for things to follow:   1)Avoid ibuprofen/Advil/Aleve/Motrin/Goody Powders/Naproxen/BC powders/Meloxicam/Diclofenac/Indomethacin and other Nonsteroidal anti-inflammatory medications as these will make you more likely to bleed and can cause stomach ulcers, can also cause Kidney problems.   2) encourage increased oral intake including nutritional supplements  3) please consider palliative care/hospice  Enrollment  4) keep your appointment with your oncologist   Admission Diagnosis  Cancer ([place]) [C80.1] RUQ discomfort [R10.11]   Discharge Diagnosis  Cancer ([place]) [C80.1] RUQ discomfort [R10.11]    Principal Problem:   Obstructive jaundice due to cancer [facility]) Active Problems:   HYPERTENSION, BENIGN   Coronary atherosclerosis of native coronary artery   Hyperlipidemia   Malignant neoplasm of prostate ([place])   Pancreatic cancer ([place])   CKD (chronic kidney disease), stage IV ([place])   ARF (acute renal failure) ([place])      Past Medical History:  Diagnosis Date  . Acute MI, inferior wall ([place]) 2011  . Amputation finger    right first finger top portion age 18  . Aortic atherosclerosis ([place])   . ARF (acute renal failure) ([place]) 01/24/2015  . CHF (congestive heart failure) ([place])   . CKD (chronic kidney disease), stage III ([place]) 11/2015  . Coronary artery disease cardioloigst-  dr Stanford Breed   hx inferior MI 10-08-2009 emergency CABG x4  . DOE (dyspnea on exertion)    since starting chemo  . DVT (deep venous thrombosis) ([place]) 11/05/2016   right proximal-mid peroneal veins  . Fracture of left hip ([place])   . H/O hyperkalemia   . History of acute inferior wall  myocardial infarction 10/08/2009  . History of blood transfusion   . History of chemotherapy   . History of echocardiogram 03/2012   EF 45%, LAE, Mild MR, no residual VSD  . History of first degree heart block   . History of metabolic acidosis   . History of thrombocytopenia   . History of urethral stricture   . History of UTI   . Hyperlipidemia   . Hypertension   . Ischemic cardiomyopathy   . Kidney cysts    bilateral  . Multiple pulmonary nodules    Bilateral scattered  . Multiple thyroid nodules   . Obstructed, uropathy   . Obstructive uropathy   . Pancreatic carcinoma (HCC)    s/p SBRT  . Pneumonia    Childhood  . Prostate cancer ([place])   . S/P VSD repair    09/2009  . Systolic CHF, chronic ([place])   . VSD (ventricular septal defect) 09/2009   acute after MI    Past Surgical History:  Procedure Laterality Date  . BILIARY STENT PLACEMENT N/A 12/29/2017   Procedure: BILIARY STENT PLACEMENT;  Surgeon: Clarene Essex, MD;  Location: [facility];  Service: Endoscopy;  Laterality: N/A;  . CARDIAC CATHETERIZATION    . CORONARY ARTERY BYPASS GRAFT     times 4  . CYSTOSCOPY W/ RETROGRADES N/A 04/03/2015   Procedure:  BLADDER BIOPSIES;  Surgeon: Festus Aloe, MD;  Location: [facility];  Service: Urology;  Laterality: N/A;  . CYSTOSCOPY W/ RETROGRADES Bilateral 06/27/2016   Procedure: CYSTOSCOPY WITH BILATERAL RETROGRADES;  Surgeon: Festus Aloe, MD;  [facility];  Service: Urology;  Laterality: Bilateral;  . CYSTOSCOPY WITH BIOPSY N/A 06/27/2016   Procedure: CYSTOSCOPY WITH BLADDER BIOPSY URETHRAL BIOPSY;  Surgeon: Festus Aloe, MD;  [facility];  Service: Urology;  Laterality: N/A;  . CYSTOSCOPY WITH URETHRAL DILATATION N/A 04/03/2015   Procedure: CYSTOSCOPY WITH BILATERAL RETROGRADES;  Surgeon: Festus Aloe, MD;  [facility];  Service: Urology;  Laterality: N/A;  . ENDOSCOPIC RETROGRADE CHOLANGIOPANCREATOGRAPHY (ERCP) WITH PROPOFOL N/A 12/29/2017    Procedure: ENDOSCOPIC RETROGRADE CHOLANGIOPANCREATOGRAPHY (ERCP) WITH PROPOFOL;  Surgeon: Clarene Essex, MD;  [facility];  Service: Endoscopy;  Laterality: N/A;  . EUS N/A 08/28/2016   Procedure: UPPER ENDOSCOPIC ULTRASOUND (EUS) RADIAL;  Surgeon: Milus Banister, MD;  [facility];  Service: Endoscopy;  Laterality: N/A;  . flexible cystoscopy    . GOLD SEED IMPLANT N/A 07/03/2017   Procedure: GOLD SEED IMPLANT;  Surgeon: Festus Aloe, MD;  Location: [facility];  Service: Urology;  Laterality: N/A;  Needs Ultrasound Tech  . REMOVAL OF STONES  12/29/2017   Procedure: REMOVAL OF STONES;  Surgeon: Clarene Essex, MD;  [facility];  Service: Endoscopy;;  Balloon Sweep of Duct  . repair of post infarction posterior ventricular septal defect  09/2009  . SPACE OAR INSTILLATION N/A 07/03/2017   Procedure: SPACE OAR INSTILLATION;  Surgeon: Festus Aloe, MD;  Location: [facility];  Service: Urology;  Laterality: N/A;  . SPHINCTEROTOMY  12/29/2017   Procedure: SPHINCTEROTOMY;  Surgeon: Clarene Essex, MD;  [facility];  Service: Endoscopy;;  . TRANSURETHRAL RESECTION OF PROSTATE  06/27/2016   Procedure: TRANSURETHRAL RESECTION OF THE PROSTATE (TURP);  Surgeon: Festus Aloe, MD;  Location: [facility];  Service: Urology;;  . UPPER GI ENDOSCOPY         HPI  from the history and physical done on the day of admission:     Chief Complaint: Abdominal pain, nausea, vomiting  HPI: [name] is a 72 y.o. male with medical history significant for Pancreatic Cancer s/p SBRT, Prostate Cancer s/p TURP and XRT, CAD s/p CABG, CKD Stage IV, VSD s/p repair, HTN, and HLD who presents to the ED with 2 days of bilateral lower abdominal pain associated with nausea and vomiting.  He has been unable to maintain oral intake and has had associated lightheadedness without syncope.  He reports regular bowel movements and denies any dysuria.  He  denies any fevers, chills, diaphoresis, chest pain, dyspnea.  ED Course:  Initial vitals showed BP 97/59, pulse 59, RR 18, temp 97.34F, SPO2 100% on room air.  Lab work was notable for AST 111, ALT 148, alkaline phosphatase 520, total bilirubin 6.2 all of which were essentially within normal limits on 10/05/2017.  Creatinine is 4.01 with GFR 16 compared to Cr 3.38 and GFR 19 on 10/05/2017.  CBC shows WBC 4.3, hemoglobin 8.4, platelets 268.  Lipase 22.  A RUQ U/S was negative for gallstones or cholecystitis, CBD diameter 0.8 cm, no acute finding.  A CT abdomen/pelvis without contrast was obtained which showed new intrahepatic and progressive extrahepatic biliary dilatation  consistent with obstructive jaundice from local recurrence of pancreatic cancer or nodal adenopathy and biliary decompression was recommended per radiology read.  He was given two 500 mL NS boluses with improvement in blood pressure.  The hospitalist service was consulted for admission.    Hospital Course:     Brief Summary 72 year old gentleman history of pancreatic cancer status post SBRT, prostate cancer status post TURP and XRT, coronary artery disease status post CABG, chronic kidney disease stage IV, VSD status post repair, hypertension hyperlipidemia presented to the ED with 2-day history of bilateral lower abdominal pain with nausea and emesis, admitted 12/27/2017 with obstructive jaundice secondary to underlying pancreatic malignancy,  patient declines further in the intervention at this time, patient wants to go home with home aide services to spend whatever time he has left with family    Plan:- 1) obstructive jaundice in the setting of pancreatic Cancer s/p SBRT--- status post ERCP and stent placement on 12/29/2017, bilirubin trending down per Dr. Burr Medico possible discharge on 12/31/2017, ???  Poor candidate for surgical treatment,  palliative care consult appreciated,   2)AKI----acute kidney injury on CKD stage -  IV due to nausea vomiting and volume depletion,   creatinine on admission= 4.0  ,   baseline creatinine = 3.38 (09/2017)   , creatinine is now= 4.6  , renally adjust medications, avoid nephrotoxic agents/dehydration/hypotension ,  patient declines further in the intervention at this time, patient wants to go home with home aide services to spend whatever time he has left with family  3)CAD/S/p CABG and HTN--- stable, continue Coreg, also continue aspirin and Crestor  4)History of prostate cancer- Status post TURP and XRT. Continue Flomax.Outpatient follow-up.  5)FEN/Anorexia--- oral intake remains very poor, discharged on Megace for appetite stimulation  NB!!! Physical Therapist recommends SNF rehab for patient, patient is refusing SNF  rehab, palliative care consult appreciated, patient has  limited options for treating his underlying malignancy, plan of care discussed with patient, also discussed plan with patient's son at patient's request.....Marland Kitchen at this time patient and son would like to go home with home care services including social work, they will try to continue conversations with palliative care service regarding goals of care   code Status : FULL   Disposition Plan  :   discharge on 01/01/2018 to Home with HH  Consults  :  Gi and Oncology, palliative care service  DVT prophylaxis:SCDs Code Status:Full Family Communication:Discussed with patient's son at patient's request   disposition Plan:Home with home care services, palliative care and hospice enrollment advised   Consultants:  Gastroenterology: Dr. Cristina Gong 12/28/2017  Oncology: Dr. Burr Medico 12/28/2017  Procedures:  CT abdomen and pelvis 12/27/2017  Renal ultrasound 12/28/2017  Right upper quadrant ultrasound 12/27/2017  ERCP with endoscopic stent placement into the biliary or pancreatic duct by Dr. Watt Climes 12/29/2017   Discharge Condition: Overall prognosis is poor  Follow UP--- palliative care  and hospice enrollment advised as outpatient  Follow-up Information    [facility] [place] Follow up.   Contact information: [place] [address] [phone]           Diet and Activity recommendation:  As advised  Discharge Instructions     Discharge Instructions    Call MD for:  difficulty breathing, headache or visual disturbances   Complete by:  As directed    Call MD for:  persistant dizziness or light-headedness   Complete by:  As directed    Call MD for:  persistant  nausea and vomiting   Complete by:  As directed    Call MD for:  severe uncontrolled pain   Complete by:  As directed    Call MD for:  temperature >100.4   Complete by:  As directed    Diet general   Complete by:  As directed    Discharge instructions   Complete by:  As directed    1)Avoid ibuprofen/Advil/Aleve/Motrin/Goody Powders/Naproxen/BC powders/Meloxicam/Diclofenac/Indomethacin and other Nonsteroidal anti-inflammatory medications as these will make you more likely to bleed and can cause stomach ulcers, can also cause Kidney problems.   2) encourage increased oral intake including nutritional supplements  3) please consider palliative care/hospice  Enrollment  4) keep your appointment with your oncologist   Increase activity slowly   Complete by:  As directed         Discharge Medications     Allergies as of 01/01/2018      Reactions   Chlorhexidine Gluconate Other (See Comments)   Red skin and flaked skin       Medication List    STOP taking these medications   cephALEXin 500 MG capsule Commonly known as:  KEFLEX     TAKE these medications   amLODipine 10 MG tablet Commonly known as:  NORVASC Take 0.5 tablets (5 mg total) by mouth daily.   aspirin EC 81 MG tablet Take 1 tablet (81 mg total) by mouth daily.   carvedilol 12.5 MG tablet Commonly known as:  COREG Take 1 tablet (12.5 mg total) by mouth 2 (two) times daily with a meal.     megestrol 400 MG/10ML suspension Commonly known as:  MEGACE Take 10 mLs (400 mg total) by mouth daily. Start taking on:  January 02, 2018   ondansetron 4 MG tablet Commonly known as:  ZOFRAN Take 1 tablet (4 mg total) by mouth every 6 (six) hours as needed for nausea or vomiting.   oxyCODONE 5 MG immediate release tablet Commonly known as:  ROXICODONE Take 1 tablet (5 mg total) by mouth every 4 (four) hours as needed for severe pain.   rosuvastatin 40 MG tablet Commonly known as:  CRESTOR Take 1 tablet (40 mg total) by mouth daily.   sodium bicarbonate 650 MG tablet Take 1 tablet (650 mg total) by mouth 2 (two) times daily.   tamsulosin 0.4 MG Caps capsule Commonly known as:  FLOMAX Take 1 capsule (0.4 mg total) by mouth daily after supper.            Durable Medical Equipment  (From admission, onward)         Start     Ordered   01/01/18 1357  For home use only DME Walker rolling  Once    Question:  Patient needs a walker to treat with the following condition  Answer:  Weakness   01/01/18 1357          Major procedures and Radiology Reports - PLEASE review detailed and final reports for all details, in brief -    Ct Abdomen Pelvis Wo Contrast  Result Date: 12/27/2017 CLINICAL DATA:  Low abdominal pain with bloating for 2 days. No appetite. History of pancreatic cancer. Elevated liver function studies. EXAM: CT ABDOMEN AND PELVIS WITHOUT CONTRAST TECHNIQUE: Multidetector CT imaging of the abdomen and pelvis was performed following the standard protocol without IV contrast. COMPARISON:  Abdominopelvic CT 10/05/2017. FINDINGS: Lower chest: Stable mild chronic central airway thickening in both lung bases. The heart is enlarged with a chronic calcified aneurysm  involving inferior wall left ventricle. No significant pleural or pericardial effusion. Hepatobiliary: No focal hepatic lesions are identified on noncontrast imaging. However, there is new intrahepatic biliary  dilatation with increased extrahepatic biliary dilatation. The gallbladder appears unremarkable. Pancreas: Ill-defined enlargement of the pancreatic head is grossly stable, suboptimally evaluated on this noncontrast study. There is a stable surgical clip or fiduciary marker at the pancreatic neck. There is stable pancreatic atrophy without significant ductal dilatation or surrounding inflammatory change. Spleen: Normal in size without focal abnormality. Adrenals/Urinary Tract: Both adrenal glands appear normal. The kidneys appears similar to the previous study. There is bilateral renal cortical thinning with low-density and hyperdense lesions bilaterally. There is mild chronic collecting system dilatation and wall thickening. There is moderate diffuse bladder wall thickening. Stomach/Bowel: No evidence of bowel wall thickening, distention or surrounding inflammatory change. The appendix appears normal. Vascular/Lymphatic: There is increased low-density in the porta hepatis, difficult to differentiate from the portal vein and bile duct on this noncontrast study, although suspicious for worsening portacaval adenopathy given the worsening biliary dilatation. This measures up to 16 mm short axis on image 22. There are no enlarged retroperitoneal or pelvic lymph nodes. Diffuse aortic and branch vessel atherosclerosis. Reproductive: Radiation clips in the prostate gland and mild enlargement of the gland are stable. Other: No ascites or peritoneal nodularity. Musculoskeletal: No acute or significant osseous findings. Chronic biconcave deformities in the lower lumbar spine are stable. No evidence of osseous metastatic disease. Stable lipomas in the external oblique muscles of the abdominal wall bilaterally. IMPRESSION: 1. New intrahepatic and progressive extrahepatic biliary dilatation consistent with obstructive jaundice from local recurrence of pancreatic cancer or nodal adenopathy in the porta hepatis. Biliary  decompression recommended. 2. No hepatic metastases identified on noncontrast imaging. No ascites or peritoneal nodularity. 3. Stable chronic mild renal collecting system dilatation and wall thickening without secondary signs of acute obstruction. Chronic bladder wall thickening. 4. Chronic calcified left ventricular aneurysm. Electronically Signed   By: Richardean Sale M.D.   On: 12/27/2017 18:00   US Renal  Result Date: 12/28/2017 CLINICAL DATA:  Acute renal insufficiency. EXAM: RENAL / URINARY TRACT ULTRASOUND COMPLETE COMPARISON:  None. FINDINGS: Right Kidney: Renal measurements: 10.6 x 5.7 x 4.7 cm = volume: 135.2 mL . Echogenicity within normal limits. No mass or hydronephrosis visualized. Left Kidney: Renal measurements: 8.5 x 4.8 x 3.2 cm = volume: 68.4 mL. Contains 2 cysts with the largest measuring 1.7 cm. Bladder: Appears normal for degree of bladder distention. IMPRESSION: 1. No cause for acute renal failure identified.  Left renal cysts. Electronically Signed   By: Dorise Bullion III M.D   On: 12/28/2017 10:53   Dg Ercp Biliary & Pancreatic Ducts  Result Date: 12/29/2017 CLINICAL DATA:  Pancreatic mass, now with biliary ductal dilatation, post ERCP and biliary stent placement. EXAM: ERCP TECHNIQUE: Multiple spot images obtained with the fluoroscopic device and submitted for interpretation post-procedure. FLUOROSCOPY TIME:  2 minutes, 42 seconds (46.3 mGy) COMPARISON:  CT abdomen pelvis-12/27/2017; 10/05/2017 FINDINGS: 7 spot fluoroscopic images of the right upper abdominal quadrant during ERCP are provided for review Initial image demonstrates an ERCP probe overlying the right upper abdominal quadrant. Surgical clips overlie the midline of the abdomen. There is faint opacification of the central intrahepatic biliary system which appears mildly dilated. There is minimal opacification of the cystic duct. Subsequent image demonstrates a long segment moderate length narrowing of the CBD (image  2). Subsequent images demonstrate placement of an internal metallic stent within distal aspect  of the CBD, traversing the long segment CBD narrowing. IMPRESSION: ERCP with metallic biliary stent placement as above. These images were submitted for radiologic interpretation only. Please see the procedural report for the amount of contrast and the fluoroscopy time utilized. Electronically Signed   By: Sandi Mariscal M.D.   On: 12/29/2017 12:52   US Abdomen Limited Ruq  Result Date: 12/27/2017 CLINICAL DATA:  Right upper quadrant pain for 3 days. EXAM: ULTRASOUND ABDOMEN LIMITED RIGHT UPPER QUADRANT COMPARISON:  None. FINDINGS: Gallbladder: No gallstones or wall thickening visualized. No sonographic Murphy sign noted by sonographer. Common bile duct: Diameter: 0.8 cm. Liver: No focal lesion identified. Within normal limits in parenchymal echogenicity. Portal vein is patent on color Doppler imaging with normal direction of blood flow towards the liver. IMPRESSION: Negative exam. Electronically Signed   By: Inge Rise M.D.   On: 12/27/2017 17:35    Micro Results   Recent Results (from the past 240 hour(s))  Culture, blood (routine x 2)     Status: None (Preliminary result)   Collection Time: 12/29/17 12:10 AM  Result Value Ref Range Status   Specimen Description   Final    BLOOD RIGHT HAND Performed at [facility] [address] [place], [place] [address]    Special Requests   Final    BOTTLES DRAWN AEROBIC ONLY Blood Culture adequate volume Performed at [facility] [address] [place], [place] [address]    Culture   Final    NO GROWTH 3 DAYS Performed at [facility], [place] [address], [place], [place] [address]    Report Status PENDING  Incomplete  Culture, blood (routine x 2)     Status: None (Preliminary result)   Collection Time: 12/29/17 12:10 AM  Result Value Ref Range Status   Specimen Description   Final    BLOOD RIGHT  WRIST Performed at [facility] [address] [place], [place] [address]    Special Requests   Final    BOTTLES DRAWN AEROBIC AND ANAEROBIC Blood Culture adequate volume Performed at [facility] [address], [place], [place] [address]    Culture   Final    NO GROWTH 3 DAYS Performed at [facility], [place] [address] [place], [place], [place] [address]    Report Status PENDING  Incomplete  Culture, Urine     Status: None   Collection Time: 12/29/17 11:37 PM  Result Value Ref Range Status   Specimen Description   Final    URINE, CLEAN CATCH Performed at [facility], [place] [address], [place], [place] [address]    Special Requests   Final    NONE Performed at [facility], [place] [address] [place], [place] [address]    Culture   Final    NO GROWTH Performed at [facility], [place] [address] [place],  [address]    Report Status 12/31/2017 FINAL  Final       Today   Subjective    [name] today has no complaints, patient remains weak, appetite remains poor, patient continues to refuse transfer to SNF          Patient has been seen and examined prior to discharge   Objective   Blood pressure 93/60, pulse 65, temperature 100 F (37.8 C), temperature source Oral, resp. rate 17, height 5\' 7"  (1.702 m), weight 83.1 kg, SpO2 100 %.   Intake/Output Summary (  Last 24 hours) at 01/01/2018 1541 Last data filed at 01/01/2018 0930 Gross per 24 hour  Intake 1576.68 ml  Output 975 ml  Net 601.68 ml    Exam Gen:- Awake Alert, looks chronically ill  HEENT:- [place].AT, No sclera icterus Neck-Supple Neck,No JVD,.  Lungs-  CTAB , fair symmetrical air movement CV- S1, S2 normal, regular , CABG scar Abd-  +ve B.Sounds, Abd Soft, No tenderness,    Extremity/Skin:-   pedal pulses present  Psych-affect is appropriate, oriented x3 Neuro-no new focal deficits, no tremors, Patient remains very  weak and tired, can barely make it to the chair from the bed even with maximum assist of 1   Data Review   CBC w Diff:  Lab Results  Component Value Date   WBC 5.6 12/31/2017   HGB 9.1 (L) 12/31/2017   HGB 8.8 (L) 11/26/2016   HCT 29.2 (L) 12/31/2017   HCT 27.2 (L) 11/26/2016   PLT 238 12/31/2017   PLT 367 11/26/2016   LYMPHOPCT 12 12/30/2017   LYMPHOPCT 38.1 11/26/2016   MONOPCT 9 12/30/2017   MONOPCT 15.3 (H) 11/26/2016   EOSPCT 0 12/30/2017   EOSPCT 4.9 11/26/2016   BASOPCT 0 12/30/2017   BASOPCT 0.6 11/26/2016    CMP:  Lab Results  Component Value Date   NA 133 (L) 01/01/2018   NA 140 11/26/2016   K 3.7 01/01/2018   K 4.0 11/26/2016   CL 103 01/01/2018   CO2 14 (L) 01/01/2018   CO2 20 (L) 11/26/2016   BUN 56 (H) 01/01/2018   BUN 34.9 (H) 11/26/2016   CREATININE 4.60 (H) 01/01/2018   CREATININE 2.3 (H) 11/26/2016   PROT 7.8 12/30/2017   PROT 7.8 11/26/2016   ALBUMIN 2.9 (L) 12/30/2017   ALBUMIN 2.6 (L) 11/26/2016   BILITOT 2.6 (H) 12/30/2017   BILITOT <0.22 11/26/2016   ALKPHOS 511 (H) 12/30/2017   ALKPHOS 100 11/26/2016   AST 102 (H) 12/30/2017   AST 19 11/26/2016   ALT 144 (H) 12/30/2017   ALT 16 11/26/2016  .   Total Discharge time is about 33 minutes  Roxan Hockey M.D on 01/01/2018 at 3:41 PM  Pager---[phone]  Go to [email or web address] - password [email or web address] for contact info  [facility] - Office  [phone]

## 2018-01-01 NOTE — Plan of Care (Signed)
Plan of care reviewed and discussed with the patient. 

## 2018-01-01 NOTE — Care Management (Signed)
This CM spoke with pt at bedside for DC planning. Pt is declining SNF and wants home health services. Choice offered and [facility] chosen. [facility] also has hospice services and [facility] rep states that they will continue [place] conversations with the pt and can transition him to Hospice when appropriate. Will need MD orders for HHRN/PT/OT/Aide.  Marney Doctor RN,BSN [phone]

## 2018-01-03 LAB — CULTURE, BLOOD (ROUTINE X 2)
Culture: NO GROWTH
Culture: NO GROWTH
Special Requests: ADEQUATE
Special Requests: ADEQUATE

## 2018-01-04 DIAGNOSIS — K831 Obstruction of bile duct: Secondary | ICD-10-CM | POA: Diagnosis not present

## 2018-01-04 DIAGNOSIS — I2510 Atherosclerotic heart disease of native coronary artery without angina pectoris: Secondary | ICD-10-CM | POA: Diagnosis not present

## 2018-01-04 DIAGNOSIS — N184 Chronic kidney disease, stage 4 (severe): Secondary | ICD-10-CM | POA: Diagnosis not present

## 2018-01-04 DIAGNOSIS — Z951 Presence of aortocoronary bypass graft: Secondary | ICD-10-CM | POA: Diagnosis not present

## 2018-01-04 DIAGNOSIS — I5022 Chronic systolic (congestive) heart failure: Secondary | ICD-10-CM | POA: Diagnosis not present

## 2018-01-04 DIAGNOSIS — C259 Malignant neoplasm of pancreas, unspecified: Secondary | ICD-10-CM | POA: Diagnosis not present

## 2018-01-04 DIAGNOSIS — E785 Hyperlipidemia, unspecified: Secondary | ICD-10-CM | POA: Diagnosis not present

## 2018-01-04 DIAGNOSIS — I130 Hypertensive heart and chronic kidney disease with heart failure and stage 1 through stage 4 chronic kidney disease, or unspecified chronic kidney disease: Secondary | ICD-10-CM | POA: Diagnosis not present

## 2018-01-04 DIAGNOSIS — Z8546 Personal history of malignant neoplasm of prostate: Secondary | ICD-10-CM | POA: Diagnosis not present

## 2018-01-07 DIAGNOSIS — I130 Hypertensive heart and chronic kidney disease with heart failure and stage 1 through stage 4 chronic kidney disease, or unspecified chronic kidney disease: Secondary | ICD-10-CM | POA: Diagnosis not present

## 2018-01-07 DIAGNOSIS — I2510 Atherosclerotic heart disease of native coronary artery without angina pectoris: Secondary | ICD-10-CM | POA: Diagnosis not present

## 2018-01-07 DIAGNOSIS — N184 Chronic kidney disease, stage 4 (severe): Secondary | ICD-10-CM | POA: Diagnosis not present

## 2018-01-07 DIAGNOSIS — I5022 Chronic systolic (congestive) heart failure: Secondary | ICD-10-CM | POA: Diagnosis not present

## 2018-01-07 DIAGNOSIS — K831 Obstruction of bile duct: Secondary | ICD-10-CM | POA: Diagnosis not present

## 2018-01-07 DIAGNOSIS — C259 Malignant neoplasm of pancreas, unspecified: Secondary | ICD-10-CM | POA: Diagnosis not present

## 2018-01-23 DIAGNOSIS — N184 Chronic kidney disease, stage 4 (severe): Secondary | ICD-10-CM | POA: Diagnosis not present

## 2018-01-23 DIAGNOSIS — I2510 Atherosclerotic heart disease of native coronary artery without angina pectoris: Secondary | ICD-10-CM | POA: Diagnosis not present

## 2018-01-23 DIAGNOSIS — C259 Malignant neoplasm of pancreas, unspecified: Secondary | ICD-10-CM | POA: Diagnosis not present

## 2018-01-23 DIAGNOSIS — I130 Hypertensive heart and chronic kidney disease with heart failure and stage 1 through stage 4 chronic kidney disease, or unspecified chronic kidney disease: Secondary | ICD-10-CM | POA: Diagnosis not present

## 2018-01-23 DIAGNOSIS — K831 Obstruction of bile duct: Secondary | ICD-10-CM | POA: Diagnosis not present

## 2018-01-23 DIAGNOSIS — I5022 Chronic systolic (congestive) heart failure: Secondary | ICD-10-CM | POA: Diagnosis not present

## 2018-01-25 ENCOUNTER — Inpatient Hospital Stay: Payer: Medicare Other

## 2018-01-25 ENCOUNTER — Other Ambulatory Visit: Payer: Self-pay

## 2018-01-25 ENCOUNTER — Inpatient Hospital Stay: Payer: Medicare Other | Attending: Hematology | Admitting: Hematology

## 2018-01-25 ENCOUNTER — Telehealth: Payer: Self-pay

## 2018-01-25 DIAGNOSIS — C250 Malignant neoplasm of head of pancreas: Secondary | ICD-10-CM

## 2018-01-25 MED ORDER — ONDANSETRON HCL 4 MG PO TABS: 4.0000 mg | ORAL_TABLET | Freq: Four times a day (QID) | ORAL | 1 refills | Status: AC | PRN

## 2018-01-25 NOTE — Telephone Encounter (Signed)
Spoke with patient's son regarding how the patient is doing.  He is states he is much better than in the hospital.  The family has not decided on Palliative care or Hospice at this point.  Instructed him to call back if we can be of any assistance.  He verbalized an understanding.

## 2018-01-28 DIAGNOSIS — K831 Obstruction of bile duct: Secondary | ICD-10-CM | POA: Diagnosis not present

## 2018-01-28 DIAGNOSIS — C259 Malignant neoplasm of pancreas, unspecified: Secondary | ICD-10-CM | POA: Diagnosis not present

## 2018-01-28 DIAGNOSIS — I5022 Chronic systolic (congestive) heart failure: Secondary | ICD-10-CM | POA: Diagnosis not present

## 2018-01-28 DIAGNOSIS — N184 Chronic kidney disease, stage 4 (severe): Secondary | ICD-10-CM | POA: Diagnosis not present

## 2018-01-28 DIAGNOSIS — I2510 Atherosclerotic heart disease of native coronary artery without angina pectoris: Secondary | ICD-10-CM | POA: Diagnosis not present

## 2018-01-28 DIAGNOSIS — I130 Hypertensive heart and chronic kidney disease with heart failure and stage 1 through stage 4 chronic kidney disease, or unspecified chronic kidney disease: Secondary | ICD-10-CM | POA: Diagnosis not present

## 2018-02-03 DIAGNOSIS — N184 Chronic kidney disease, stage 4 (severe): Secondary | ICD-10-CM | POA: Diagnosis not present

## 2018-02-03 DIAGNOSIS — I130 Hypertensive heart and chronic kidney disease with heart failure and stage 1 through stage 4 chronic kidney disease, or unspecified chronic kidney disease: Secondary | ICD-10-CM | POA: Diagnosis not present

## 2018-02-03 DIAGNOSIS — I5022 Chronic systolic (congestive) heart failure: Secondary | ICD-10-CM | POA: Diagnosis not present

## 2018-02-03 DIAGNOSIS — K831 Obstruction of bile duct: Secondary | ICD-10-CM | POA: Diagnosis not present

## 2018-02-03 DIAGNOSIS — I2510 Atherosclerotic heart disease of native coronary artery without angina pectoris: Secondary | ICD-10-CM | POA: Diagnosis not present

## 2018-02-03 DIAGNOSIS — C259 Malignant neoplasm of pancreas, unspecified: Secondary | ICD-10-CM | POA: Diagnosis not present

## 2018-02-06 ENCOUNTER — Emergency Department (HOSPITAL_COMMUNITY): Payer: Medicare Other

## 2018-02-06 ENCOUNTER — Observation Stay (HOSPITAL_COMMUNITY)
Admission: EM | Admit: 2018-02-06 | Discharge: 2018-02-08 | Disposition: A | Discharge status: Home / Self Care | Payer: Medicare Other | Attending: Internal Medicine | Admitting: Internal Medicine

## 2018-02-06 ENCOUNTER — Other Ambulatory Visit: Payer: Self-pay

## 2018-02-06 ENCOUNTER — Encounter (HOSPITAL_COMMUNITY): Payer: Self-pay | Admitting: *Deleted

## 2018-02-06 DIAGNOSIS — C259 Malignant neoplasm of pancreas, unspecified: Secondary | ICD-10-CM | POA: Diagnosis present

## 2018-02-06 DIAGNOSIS — I639 Cerebral infarction, unspecified: Secondary | ICD-10-CM | POA: Diagnosis not present

## 2018-02-06 DIAGNOSIS — E440 Moderate protein-calorie malnutrition: Secondary | ICD-10-CM

## 2018-02-06 DIAGNOSIS — Z8546 Personal history of malignant neoplasm of prostate: Secondary | ICD-10-CM | POA: Diagnosis not present

## 2018-02-06 DIAGNOSIS — I2510 Atherosclerotic heart disease of native coronary artery without angina pectoris: Principal | ICD-10-CM | POA: Insufficient documentation

## 2018-02-06 DIAGNOSIS — E785 Hyperlipidemia, unspecified: Secondary | ICD-10-CM | POA: Diagnosis not present

## 2018-02-06 DIAGNOSIS — I6389 Other cerebral infarction: Secondary | ICD-10-CM | POA: Diagnosis not present

## 2018-02-06 DIAGNOSIS — R627 Adult failure to thrive: Secondary | ICD-10-CM | POA: Insufficient documentation

## 2018-02-06 DIAGNOSIS — Z87891 Personal history of nicotine dependence: Secondary | ICD-10-CM | POA: Insufficient documentation

## 2018-02-06 DIAGNOSIS — I130 Hypertensive heart and chronic kidney disease with heart failure and stage 1 through stage 4 chronic kidney disease, or unspecified chronic kidney disease: Secondary | ICD-10-CM | POA: Insufficient documentation

## 2018-02-06 DIAGNOSIS — Z8673 Personal history of transient ischemic attack (TIA), and cerebral infarction without residual deficits: Secondary | ICD-10-CM | POA: Diagnosis not present

## 2018-02-06 DIAGNOSIS — Z8507 Personal history of malignant neoplasm of pancreas: Secondary | ICD-10-CM | POA: Insufficient documentation

## 2018-02-06 DIAGNOSIS — Z951 Presence of aortocoronary bypass graft: Secondary | ICD-10-CM | POA: Insufficient documentation

## 2018-02-06 DIAGNOSIS — R109 Unspecified abdominal pain: Secondary | ICD-10-CM | POA: Diagnosis not present

## 2018-02-06 DIAGNOSIS — R778 Other specified abnormalities of plasma proteins: Secondary | ICD-10-CM

## 2018-02-06 DIAGNOSIS — R7989 Other specified abnormal findings of blood chemistry: Secondary | ICD-10-CM

## 2018-02-06 DIAGNOSIS — N184 Chronic kidney disease, stage 4 (severe): Secondary | ICD-10-CM | POA: Diagnosis not present

## 2018-02-06 DIAGNOSIS — R110 Nausea: Secondary | ICD-10-CM | POA: Diagnosis not present

## 2018-02-06 DIAGNOSIS — R790 Abnormal level of blood mineral: Secondary | ICD-10-CM | POA: Diagnosis not present

## 2018-02-06 DIAGNOSIS — D649 Anemia, unspecified: Secondary | ICD-10-CM

## 2018-02-06 DIAGNOSIS — R42 Dizziness and giddiness: Secondary | ICD-10-CM | POA: Diagnosis not present

## 2018-02-06 DIAGNOSIS — R270 Ataxia, unspecified: Secondary | ICD-10-CM | POA: Diagnosis not present

## 2018-02-06 DIAGNOSIS — I5022 Chronic systolic (congestive) heart failure: Secondary | ICD-10-CM | POA: Insufficient documentation

## 2018-02-06 LAB — COMPREHENSIVE METABOLIC PANEL
ALK PHOS: 60 U/L (ref 38–126)
ALT: 12 U/L (ref 0–44)
AST: 17 U/L (ref 15–41)
Albumin: 2.3 g/dL — ABNORMAL LOW (ref 3.5–5.0)
Anion gap: 13 (ref 5–15)
BUN: 47 mg/dL — ABNORMAL HIGH (ref 8–23)
CALCIUM: 9.3 mg/dL (ref 8.9–10.3)
CO2: 15 mmol/L — ABNORMAL LOW (ref 22–32)
Chloride: 104 mmol/L (ref 98–111)
Creatinine, Ser: 3.52 mg/dL — ABNORMAL HIGH (ref 0.61–1.24)
GFR calc Af Amer: 19 mL/min — ABNORMAL LOW (ref >60)
GFR calc non Af Amer: 16 mL/min — ABNORMAL LOW (ref >60)
Glucose, Bld: 118 mg/dL — ABNORMAL HIGH (ref 70–99)
Potassium: 3.8 mmol/L (ref 3.5–5.1)
Sodium: 132 mmol/L — ABNORMAL LOW (ref 135–145)
TOTAL PROTEIN: 7.8 g/dL (ref 6.5–8.1)
Total Bilirubin: 0.6 mg/dL (ref 0.3–1.2)

## 2018-02-06 LAB — CBC
HCT: 25.1 % — ABNORMAL LOW (ref 39.0–52.0)
Hemoglobin: 7.6 g/dL — ABNORMAL LOW (ref 13.0–17.0)
MCH: 24.8 pg — ABNORMAL LOW (ref 26.0–34.0)
MCHC: 30.3 g/dL (ref 30.0–36.0)
MCV: 81.8 fL (ref 80.0–100.0)
Platelets: 454 10*3/uL — ABNORMAL HIGH (ref 150–400)
RBC: 3.07 MIL/uL — ABNORMAL LOW (ref 4.22–5.81)
RDW: 18.3 % — ABNORMAL HIGH (ref 11.5–15.5)
WBC: 8 10*3/uL (ref 4.0–10.5)
nRBC: 0 % (ref 0.0–0.2)

## 2018-02-06 LAB — TROPONIN I: Troponin I: 0.08 ng/mL (ref <0.03)

## 2018-02-06 LAB — LIPASE, BLOOD: Lipase: 21 U/L (ref 11–51)

## 2018-02-06 MED ORDER — SODIUM CHLORIDE 0.9% FLUSH
3.0000 mL | Freq: Once | INTRAVENOUS | Status: AC
  Administered 2018-02-06: 3 mL via INTRAVENOUS

## 2018-02-06 MED ORDER — ONDANSETRON HCL 4 MG/2ML IJ SOLN
4.0000 mg | Freq: Once | INTRAMUSCULAR | Status: AC
  Administered 2018-02-06: 4 mg via INTRAVENOUS
  Filled 2018-02-06: qty 2

## 2018-02-06 MED ORDER — HYDROMORPHONE HCL 1 MG/ML IJ SOLN
0.5000 mg | Freq: Once | INTRAMUSCULAR | Status: AC
  Administered 2018-02-06: 0.5 mg via INTRAVENOUS
  Filled 2018-02-06: qty 1

## 2018-02-06 MED ORDER — SODIUM CHLORIDE 0.9 % IV SOLN
10.0000 mL/h | Freq: Once | INTRAVENOUS | Status: AC
  Administered 2018-02-07: 10 mL/h via INTRAVENOUS

## 2018-02-06 MED ORDER — SODIUM CHLORIDE 0.9 % IV SOLN: INTRAVENOUS | Status: DC

## 2018-02-06 MED ORDER — SODIUM CHLORIDE 0.9 % IV BOLUS
500.0000 mL | Freq: Once | INTRAVENOUS | Status: AC
  Administered 2018-02-06: 500 mL via INTRAVENOUS

## 2018-02-06 NOTE — H&P (Signed)
History and Physical    [name]:[id number] DOB: Oct 22, 1945 DOA: 02/06/2018  PCP: Patient, No Pcp Per  Patient coming from: Home  I have personally briefly reviewed patient's old medical records in [place]  Chief Complaint: poor oral intake, gait imbalance, fatigue  HPI: STIRLING ORTON is a 73 y.o. male with medical history significant for pancreatic cancer s/p SBRT, prostate cancer s/p TURP and XRT, CAD status post CABG, CKD stage IV, VSD status post repair, hypertension, hyperlipidemia, and recent admission for obstructive jaundice status post biliary stent placement who presents the ED with progressive fatigue, low appetite with poor oral intake, nausea with occasional vomiting, abdominal pain, and dizziness/balance difficulty over the last 2 weeks.  He denies any chest pain, palpitations, peripheral edema.  ED Course:  Initial vitals showed BP 101/75, pulse 92, RR 16, temp 98.2 Fahrenheit, SPO2 100% on room air.  Lab work was notable for WBC 8.0, hemoglobin 7.6 (compared to 9.1 on 12/31/2017), platelets 454, sodium 132, potassium 3.8, bicarb 15, BUN 47, creatinine 3.52 (improved from recent baseline), AST 17, ALT 12, alk phos 60, T bili 0.6, lipase 21, troponin I 0.08.  EKG showed normal sinus rhythm, new TWI in leads I, 2, aVF, V3-V6, mild ST depression V2-V3, ? PR depression inferior leads (seen on prior).  2 view chest x-ray showed low lung volumes without focal consolidation, effusion, or edema.  CT head without contrast was obtained which showed an age-indeterminate ischemic infarct in the right occipital lobe  CT abdomen/pelvis without contrast showed peripancreatic edema with new small volume of perihepatic and perisplenic ascites, new CBD stent in place, bilateral renal simple cysts.  Neurology were consulted who are aware of patient.  Patient was given 500 mL normal saline and ordered to receive 2 units of blood transfusion.  The hospitalist service was  consulted to admit for further evaluation and management of CVA, elevated troponin, and failure to thrive.  Review of Systems: As per HPI otherwise 10 point review of systems negative.    Past Medical History:  Diagnosis Date  . Acute MI, inferior wall ([place]) 2011  . Amputation finger    right first finger top portion age 89  . Aortic atherosclerosis ([place])   . ARF (acute renal failure) ([place]) 01/24/2015  . CHF (congestive heart failure) ([place])   . CKD (chronic kidney disease), stage III ([place]) 11/2015  . Coronary artery disease cardioloigst-  dr Stanford Breed   hx inferior MI 10-08-2009 emergency CABG x4  . DOE (dyspnea on exertion)    since starting chemo  . DVT (deep venous thrombosis) ([place]) 11/05/2016   right proximal-mid peroneal veins  . Fracture of left hip ([place])   . H/O hyperkalemia   . History of acute inferior wall myocardial infarction 10/08/2009  . History of blood transfusion   . History of chemotherapy   . History of echocardiogram 03/2012   EF 45%, LAE, Mild MR, no residual VSD  . History of first degree heart block   . History of metabolic acidosis   . History of thrombocytopenia   . History of urethral stricture   . History of UTI   . Hyperlipidemia   . Hypertension   . Ischemic cardiomyopathy   . Kidney cysts    bilateral  . Multiple pulmonary nodules    Bilateral scattered  . Multiple thyroid nodules   . Obstructed, uropathy   . Obstructive uropathy   . Pancreatic carcinoma ([facility])    s/p SBRT  .  Pneumonia    Childhood  . Prostate cancer ([place])   . S/P VSD repair    09/2009  . Systolic CHF, chronic ([place])   . VSD (ventricular septal defect) 09/2009   acute after MI    Past Surgical History:  Procedure Laterality Date  . BILIARY STENT PLACEMENT N/A 12/29/2017   Procedure: BILIARY STENT PLACEMENT;  Surgeon: Clarene Essex, MD;  Location: [facility];  Service: Endoscopy;  Laterality: N/A;  . CARDIAC CATHETERIZATION    . CORONARY ARTERY BYPASS GRAFT      times 4  . CYSTOSCOPY W/ RETROGRADES N/A 04/03/2015   Procedure:  BLADDER BIOPSIES;  Surgeon: Festus Aloe, MD;  Location: [facility];  Service: Urology;  Laterality: N/A;  . CYSTOSCOPY W/ RETROGRADES Bilateral 06/27/2016   Procedure: CYSTOSCOPY WITH BILATERAL RETROGRADES;  Surgeon: Festus Aloe, MD;  Location: [facility];  Service: Urology;  Laterality: Bilateral;  . CYSTOSCOPY WITH BIOPSY N/A 06/27/2016   Procedure: CYSTOSCOPY WITH BLADDER BIOPSY URETHRAL BIOPSY;  Surgeon: Festus Aloe, MD;  Location: [facility];  Service: Urology;  Laterality: N/A;  . CYSTOSCOPY WITH URETHRAL DILATATION N/A 04/03/2015   Procedure: CYSTOSCOPY WITH BILATERAL RETROGRADES;  Surgeon: Festus Aloe, MD;  Location: [facility];  Service: Urology;  Laterality: N/A;  . ENDOSCOPIC RETROGRADE CHOLANGIOPANCREATOGRAPHY (ERCP) WITH PROPOFOL N/A 12/29/2017   Procedure: ENDOSCOPIC RETROGRADE CHOLANGIOPANCREATOGRAPHY (ERCP) WITH PROPOFOL;  Surgeon: Clarene Essex, MD;  Location: [facility];  Service: Endoscopy;  Laterality: N/A;  . EUS N/A 08/28/2016   Procedure: UPPER ENDOSCOPIC ULTRASOUND (EUS) RADIAL;  Surgeon: Milus Banister, MD;  Location: [facility];  Service: Endoscopy;  Laterality: N/A;  . flexible cystoscopy    . GOLD SEED IMPLANT N/A 07/03/2017   Procedure: GOLD SEED IMPLANT;  Surgeon: Festus Aloe, MD;  Location[facility];  Service: Urology;  Laterality: N/A;  Needs Ultrasound Tech  . REMOVAL OF STONES  12/29/2017   Procedure: REMOVAL OF STONES;  Surgeon: Clarene Essex, MD;  Location: [facility];  Service: Endoscopy;;  Balloon Sweep of Duct  . repair of post infarction posterior ventricular septal defect  09/2009  . SPACE OAR INSTILLATION N/A 07/03/2017   Procedure: SPACE OAR INSTILLATION;  Surgeon: Festus Aloe, MD;  Location: [facility];  Service: Urology;  Laterality: N/A;  . SPHINCTEROTOMY  12/29/2017   Procedure: SPHINCTEROTOMY;  Surgeon: Clarene Essex, MD;  Location: [facility];  Service: Endoscopy;;  . TRANSURETHRAL RESECTION OF PROSTATE  06/27/2016   Procedure: TRANSURETHRAL RESECTION OF THE PROSTATE (TURP);  Surgeon: Festus Aloe, MD;  Location: [facility];  Service: Urology;;  . UPPER GI ENDOSCOPY       reports that he quit smoking about 8 years ago. His smoking use included cigarettes. He has a 67.50 pack-year smoking history. He has never used smokeless tobacco. He reports that he does not drink alcohol or use drugs.  Allergies  Allergen Reactions  . Chlorhexidine Gluconate Other (See Comments)    Red skin and flaked skin     Family History  Problem Relation Age of Onset  . Cancer Paternal Aunt        unknown type cancer   . Cancer Paternal Uncle        unknown type cancer     Prior to Admission medications   Medication Sig Start Date End Date Taking? Authorizing Provider  amLODipine (NORVASC) 10 MG tablet Take 0.5 tablets (5 mg total) by mouth daily. 01/01/18  Yes Roxan Hockey, MD  aspirin EC 81 MG tablet Take 1  tablet (81 mg total) by mouth daily. 09/24/17  Yes Lelon Perla, MD  carvedilol (COREG) 12.5 MG tablet Take 1 tablet (12.5 mg total) by mouth 2 (two) times daily with a meal. 11/13/17  Yes Lelon Perla, MD  megestrol (MEGACE) 400 MG/10ML suspension Take 10 mLs (400 mg total) by mouth daily. 01/02/18  Yes Emokpae, Courage, MD  ondansetron (ZOFRAN) 4 MG tablet Take 1 tablet (4 mg total) by mouth every 6 (six) hours as needed for nausea or vomiting. 01/25/18  Yes Truitt Merle, MD  rosuvastatin (CRESTOR) 40 MG tablet Take 1 tablet (40 mg total) by mouth daily. 11/13/17  Yes Lelon Perla, MD  sodium bicarbonate 650 MG tablet Take 1 tablet (650 mg total) by mouth 2 (two) times daily. 01/01/18  Yes Emokpae, Courage, MD  tamsulosin (FLOMAX) 0.4 MG CAPS capsule Take 1 capsule (0.4 mg total) by mouth daily after supper. 01/01/18  Yes Emokpae, Courage, MD  oxyCODONE (ROXICODONE) 5 MG immediate release tablet Take 1 tablet (5 mg  total) by mouth every 4 (four) hours as needed for severe pain. Patient not taking: Reported on 02/06/2018 01/01/18   Roxan Hockey, MD    Physical Exam: Vitals:   02/06/18 2345 02/07/18 0031 02/07/18 0113 02/07/18 0146  BP: 115/73 120/88 127/89 129/75  Pulse:  81 71 72  Resp: '16  17 17  '$ Temp:  97.9 F (36.6 C) 98.2 F (36.8 C) 98.1 F (36.7 C)  TempSrc:  Oral Oral Oral  SpO2:  100%      Constitutional: Elderly man sitting up in bed, NAD, calm, comfortable Eyes: PERRL, lids and conjunctivae normal ENMT: Mucous membranes are dry. Posterior pharynx clear of any exudate or lesions. Normal dentition.  Neck: normal, supple, no masses. Respiratory: clear to auscultation bilaterally, no wheezing, no crackles. Normal respiratory effort. No accessory muscle use.  Cardiovascular: Regular rate and rhythm, no murmurs / rubs / gallops. No extremity edema. Abdomen: Mild epigastric tenderness, no masses palpated. No hepatosplenomegaly. Bowel sounds positive.  Musculoskeletal: no clubbing / cyanosis. No joint deformity upper and lower extremities. Good ROM, no contractures. Normal muscle tone.  Skin: Dry skin, no rashes, lesions, ulcers. No induration Neurologic: CN 2-12 grossly intact. Sensation intact, Strength 5/5 in all 4, no dysmetria.  Gait not assessed. Psychiatric: Alert and oriented x 3. Normal mood.     Labs on Admission: I have personally reviewed following labs and imaging studies  CBC: Recent Labs  Lab 02/06/18 1942  WBC 8.0  HGB 7.6*  HCT 25.1*  MCV 81.8  PLT 829*   Basic Metabolic Panel: Recent Labs  Lab 02/06/18 1942  NA 132*  K 3.8  CL 104  CO2 15*  GLUCOSE 118*  BUN 47*  CREATININE 3.52*  CALCIUM 9.3   GFR: CrCl cannot be calculated (Unknown ideal weight.). Liver Function Tests: Recent Labs  Lab 02/06/18 1942  AST 17  ALT 12  ALKPHOS 60  BILITOT 0.6  PROT 7.8  ALBUMIN 2.3*   Recent Labs  Lab 02/06/18 1942  LIPASE 21   No results for  input(s): AMMONIA in the last 168 hours. Coagulation Profile: No results for input(s): INR, PROTIME in the last 168 hours. Cardiac Enzymes: Recent Labs  Lab 02/06/18 1950 02/07/18 0057  TROPONINI 0.08* 0.09*   BNP (last 3 results) No results for input(s): PROBNP in the last 8760 hours. HbA1C: No results for input(s): HGBA1C in the last 72 hours. CBG: No results for input(s): GLUCAP in the last 168  hours. Lipid Profile: No results for input(s): CHOL, HDL, LDLCALC, TRIG, CHOLHDL, LDLDIRECT in the last 72 hours. Thyroid Function Tests: No results for input(s): TSH, T4TOTAL, FREET4, T3FREE, THYROIDAB in the last 72 hours. Anemia Panel: No results for input(s): VITAMINB12, FOLATE, FERRITIN, TIBC, IRON, RETICCTPCT in the last 72 hours. Urine analysis:    Component Value Date/Time   COLORURINE AMBER (A) 12/27/2017 1825   APPEARANCEUR HAZY (A) 12/27/2017 1825   LABSPEC 1.008 12/27/2017 1825   PHURINE 6.0 12/27/2017 1825   GLUCOSEU NEGATIVE 12/27/2017 1825   HGBUR MODERATE (A) 12/27/2017 1825   BILIRUBINUR NEGATIVE 12/27/2017 1825   KETONESUR NEGATIVE 12/27/2017 1825   PROTEINUR 100 (A) 12/27/2017 1825   UROBILINOGEN 0.2 08/29/2010 1726   NITRITE NEGATIVE 12/27/2017 1825   LEUKOCYTESUR LARGE (A) 12/27/2017 1825    Radiological Exams on Admission: Ct Abdomen Pelvis Wo Contrast  Result Date: 02/06/2018 CLINICAL DATA:  Pancreatic neoplasm follow-up. Abdominal pain. EXAM: CT ABDOMEN AND PELVIS WITHOUT CONTRAST TECHNIQUE: Multidetector CT imaging of the abdomen and pelvis was performed following the standard protocol without IV contrast. COMPARISON:  None. FINDINGS: Lower chest: The included heart size is enlarged but stable in appearance without pericardial effusion or thickening. Chronic left ventricular calcified aneurysm is identified the inferior wall. Lung bases are free of pulmonary consolidations. No effusion or pneumothorax is identified. Hepatobiliary: Pneumobilia is identified  compatible with recent intervention with common bowel duct stent in place. Small amount of perihepatic ascites is noted, new since prior. Gallbladder is unremarkable and free of stones. No space-occupying mass of the liver is identified given limitations of a noncontrast study. Pancreas: Peripancreatic edema is identified, new since prior raising concern for changes of acute pancreatitis. No pseudocyst formation or ductal dilatation of the pancreatic gland is identified. Surgical clips are again noted in the neck of the pancreas. Spleen: New perisplenic ascites. No splenomegaly. Adrenals/Urinary Tract: Normal bilateral adrenal glands. Subtle bilateral renal hypodensities compatible with renal cysts some which are too small to characterize. In exophytic indeterminate hypodensities noted off the lateral aspect of the left kidney, stable in appearance measuring up to 8 mm. Chronic mild ectasia of the renal collecting systems with diffuse thick-walled appearance of the urinary bladder, raising cystitis as a possibility for this appearance. Stomach/Bowel: Stomach is within normal limits. Appendix appears normal. There is mild anorectal soft tissue thickening likely due to underdistention. Internal hemorrhoids are a possibility as well. Vascular/Lymphatic: Aortoiliac and branch vessel atherosclerosis. Soft tissue densities in the porta hepatis can not exclude porta hepatic adenopathy. No significant progression is identified, the largest approximately 13 mm. No enlarged retroperitoneal or pelvic lymph nodes. Reproductive: Metallic clips noted in the prostate with mild prostatic enlargement. This is stable. Other: Mild mesenteric edema is noted Musculoskeletal: Stable multilevel biconcave likely osteoporotic mild compressions of along the lumbar spine. No aggressive osseous lesions. Stable lipomas of the external oblique muscles bilaterally. IMPRESSION: 1. Interval development of peripancreatic edema with new small volume  of perihepatic and perisplenic ascites. Findings are in keeping with acute pancreatitis. No pseudocyst formation or ductal dilatation is identified. 2. New CBD stent in place with resultant pneumobilia from recent intervention. 3. Bilateral renal hypodensities some which appear to represent simple cysts and others are indeterminate possibly proteinaceous or complex. 4. Mild anorectal soft tissue thickening, felt in part due to underdistention. Internal hemorrhoids are a possibility as well. Proctocolitis is believed less likely. Electronically Signed   By: Ashley Royalty M.D.   On: 02/06/2018 21:13   Dg Chest  2 View  Result Date: 02/06/2018 CLINICAL DATA:  Prostate and pancreatic cancer. Patient reports abdominal pain. Weakness and dizziness. EXAM: CHEST - 2 VIEW COMPARISON:  Chest CT 11/25/2016. Lung bases from abdominal CT performed concurrently. FINDINGS: Post median sternotomy and CABG. Low lung volumes with prominent heart size, accentuated by hypoaeration. No pulmonary edema, focal airspace disease, pleural effusion or pneumothorax. No acute osseous abnormalities. IMPRESSION: Low lung volumes without acute abnormality. Electronically Signed   By: Keith Rake M.D.   On: 02/06/2018 21:06   Ct Head Wo Contrast  Addendum Date: 02/06/2018   ADDENDUM REPORT: 02/06/2018 21:31 ADDENDUM: These results were called by telephone at the time of interpretation on 02/06/2018 at 9:21 pm to Dr. Fredia Sorrow , who verbally acknowledged these results. Electronically Signed   By: Fidela Salisbury M.D.   On: 02/06/2018 21:31   Result Date: 02/06/2018 CLINICAL DATA:  Ataxia.  Clinical suspicion for stroke. EXAM: CT HEAD WITHOUT CONTRAST TECHNIQUE: Contiguous axial images were obtained from the base of the skull through the vertex without intravenous contrast. COMPARISON:  None. FINDINGS: Brain: Area of hypoattenuation in the right occipital lobe, likely represents age-indeterminate ischemic infarction. No evidence  of intracranial hemorrhage, mass effect. Normal ventricular size. Vascular: Calcific atherosclerotic disease at the skull base. Skull: Normal. Negative for fracture or focal lesion. Sinuses/Orbits: Near complete opacification of the maxillary sinuses with chronic periosteal reaction and remodeling of the sinus walls. Near complete opacification of the left ethmoid sinuses with expansile appearance. Mucosal thickening of the right ethmoid sinus and bilateral sphenoid sinuses. Milder mucosal thickening of the frontal sinuses. Other: None. IMPRESSION: Age-indeterminate ischemic infarction in the right occipital lobe. Pansinusitis with chronic appearance. Fungal sinusitis is not excluded. Electronically Signed: By: Fidela Salisbury M.D. On: 02/06/2018 21:01    EKG: Independently reviewed. Normal sinus rhythm, TWI in leads I, 2, aVF, V3-V6, mild ST depression V2-V3, ? PR depression inferior leads.  Assessment/Plan Principal Problem:   CVA (cerebral vascular accident) [facility]) Active Problems:   Coronary atherosclerosis of native coronary artery   Anemia   Pancreatic cancer ([place])   CKD (chronic kidney disease), stage IV ([place])   Elevated troponin  NICASIO BARLOWE is a 73 y.o. male with medical history significant for pancreatic cancer s/p SBRT, prostate cancer s/p TURP and XRT, CAD status post CABG, CKD stage IV, VSD status post repair, hypertension, hyperlipidemia, and recent admission for obstructive jaundice status post biliary stent placement who is admitted to the ED with likely subacute right occipital lobe infarct, elevated troponin, and failure to thrive.  Likely subacute right occipital lobe infarct seen on CT head: Likely explanation for recent episodes of dizziness and gait imbalance.  Patient has been on aspirin and statin.  He is found to have a drop in his hemoglobin to 7.6 from recent baseline between 8-10 without any obvious bleeding.  Neurology consulted by EDP.  -Will continue aspirin,  patient receiving blood transfusion -Continue Crestor -Maintenance IV fluids for BP support -Will defer to neurology for further recommendations on work-up and antiplatelet therapy given patient's comorbidities  Elevated troponin, history of CAD status post CABG: Troponin 0.08 >> 0.09, likely demand ischemia.  Patient denies any chest pain.  EKG does show new T wave inversions from prior.  Will continue troponin trend, but patient unlikely to be a candidate for further intervention. -Continue aspirin and statin -Holding carvedilol with initial borderline hypotension  Acute on chronic anemia: No obvious bleeding, receiving blood transfusion as above.  Continue monitor.  Pancreatic cancer s/p SBRT and biliary stent placement for recent obstructive jaundice: LFTs have normalized compared to prior.  Prior oncology note reviewed and she does not a candidate for further surgical intervention or chemotherapy.  Patient was seen by palliative care on last admission.  CKD stage IV: Currently stable, continue monitor.  Hypertension: BP borderline hypotensive admission.  Holding home BP meds for now.  Hyperlipidemia: Continue rosuvastatin.  Failure to thrive: Patient with poor appetite and oral intake secondary to multiple comorbidities including pancreatic and prostate cancer now with likely subacute CVA.  Seen by palliative care last admission.  Discussed clinical progress with patient and family at bedside, however at this time they would want to continue as full CODE STATUS, however may be interested in further palliative care discussions.   DVT prophylaxis: SCDs Code Status: Full code, confirmed with patient Family Communication: Discussed with daughter and 2 sons at bedside Disposition Plan: Pending clinical progress and goals of care Consults called: neurology consulted by EDP  Admission status: Observation   Zada Finders MD [facility] Pager [phone]  If 7PM-7AM,  please contact night-coverage [email or web address]  02/07/2018, 2:13 AM

## 2018-02-06 NOTE — ED Triage Notes (Signed)
Pt reports that he has abdominal pain, and unable to digest all of his food, reports increased belching and nausea. LBM yesterday. Pt reports he feels dizzy when he stands up and weakness (mainly on the left) also for 3 weeks. States he is on oral chemo meds for cancer.

## 2018-02-06 NOTE — ED Provider Notes (Signed)
[facility] EMERGENCY DEPARTMENT Provider Note   CSN: [id number] Arrival date & time: 02/06/18  1907     History   Chief Complaint Chief Complaint  Patient presents with  . Abdominal Pain    HPI [name] is a 73 y.o. male.  Patient with admission in December for pancreatic mass had a stent placed.  Also has metastatic prostate cancer.  Followed by hematology oncology at [facility].  Patient back in with complaint of abdominal pain inability to digest his food.  No appetite.  Some vomiting but none for the past 2 days.  Not having much in the way of bowel movements.  Patient tells me for the past 2 weeks he has been dizzy when he stands up and has some difficulty with balance.  Patient supposedly is on oral chemo for cancer.  Patient states that he is not eating or drinking well.     Past Medical History:  Diagnosis Date  . Acute MI, inferior wall ([place]) 2011  . Amputation finger    right first finger top portion age 30  . Aortic atherosclerosis ([place])   . ARF (acute renal failure) ([place]) 01/24/2015  . CHF (congestive heart failure) ([place])   . CKD (chronic kidney disease), stage III ([place]) 11/2015  . Coronary artery disease cardioloigst-  dr Stanford Breed   hx inferior MI 10-08-2009 emergency CABG x4  . DOE (dyspnea on exertion)    since starting chemo  . DVT (deep venous thrombosis) ([place]) 11/05/2016   right proximal-mid peroneal veins  . Fracture of left hip ([place])   . H/O hyperkalemia   . History of acute inferior wall myocardial infarction 10/08/2009  . History of blood transfusion   . History of chemotherapy   . History of echocardiogram 03/2012   EF 45%, LAE, Mild MR, no residual VSD  . History of first degree heart block   . History of metabolic acidosis   . History of thrombocytopenia   . History of urethral stricture   . History of UTI   . Hyperlipidemia   . Hypertension   . Ischemic cardiomyopathy   . Kidney cysts    bilateral  . Multiple  pulmonary nodules    Bilateral scattered  . Multiple thyroid nodules   . Obstructed, uropathy   . Obstructive uropathy   . Pancreatic carcinoma (HCC)    s/p SBRT  . Pneumonia    Childhood  . Prostate cancer ([place])   . S/P VSD repair    09/2009  . Systolic CHF, chronic ([place])   . VSD (ventricular septal defect) 09/2009   acute after MI    Patient Active Problem List   Diagnosis Date Noted  . ARF (acute renal failure) ([place])   . Obstructive jaundice due to cancer ([place]) 12/27/2017  . CKD (chronic kidney disease), stage IV ([place]) 12/27/2017  . DVT (deep venous thrombosis) ([place]) 11/06/2016  . Malignant neoplasm of head of pancreas ([place]) 10/03/2016  . Pancreatic cancer ([place]) 09/12/2016  . Pancreatic mass   . Malignant neoplasm of prostate ([place]) 07/25/2016  . Abdominal pain 11/17/2015  . Acute renal failure (ARF) ([place]) 11/17/2015  . Hydronephrosis 11/17/2015  . Urethral stricture 11/17/2015  . Acute cystitis with hematuria   . Hyperkalemia, diminished renal excretion   . Diabetes mellitus screening 02/14/2015  . Uropathy, obstructive 01/30/2015  . UTI (urinary tract infection) 01/30/2015  . Trichomonas infection 01/30/2015  . Hydronephrosis determined by ultrasound   . Metabolic acidemia   .  Protein-calorie malnutrition ([place])   . Microcytic anemia   . Acute renal failure superimposed on stage 3 chronic kidney disease ([place]) 01/25/2015  . Acute left flank pain 01/25/2015  . Hyperkalemia 01/25/2015  . Thrombocytosis ([place]) 01/25/2015  . Metabolic acidosis, increased anion gap (IAG) 01/25/2015  . Hyperlipidemia 02/19/2012  . Cardiomyopathy, ischemic 02/19/2012  . Coronary atherosclerosis of native coronary artery 07/01/2010  . Other and unspecified hyperlipidemia 07/01/2010  . Chronic systolic CHF (congestive heart failure) ([place]) 12/11/2009  . HYPERTENSION, BENIGN 11/07/2009  . CAD, AUTOLOGOUS BYPASS GRAFT 11/07/2009  . Ventricular septal defect 11/07/2009  . MURMUR 11/07/2009     Past Surgical History:  Procedure Laterality Date  . BILIARY STENT PLACEMENT N/A 12/29/2017   Procedure: BILIARY STENT PLACEMENT;  Surgeon: Clarene Essex, MD;  Location: [facility];  Service: Endoscopy;  Laterality: N/A;  . CARDIAC CATHETERIZATION    . CORONARY ARTERY BYPASS GRAFT     times 4  . CYSTOSCOPY W/ RETROGRADES N/A 04/03/2015   Procedure:  BLADDER BIOPSIES;  Surgeon: Festus Aloe, MD;  Location: [facility];  Service: Urology;  Laterality: N/A;  . CYSTOSCOPY W/ RETROGRADES Bilateral 06/27/2016   Procedure: CYSTOSCOPY WITH BILATERAL RETROGRADES;  Surgeon: Festus Aloe, MD;  Location: [facility];  Service: Urology;  Laterality: Bilateral;  . CYSTOSCOPY WITH BIOPSY N/A 06/27/2016   Procedure: CYSTOSCOPY WITH BLADDER BIOPSY URETHRAL BIOPSY;  Surgeon: Festus Aloe, MD;  Location: [facility];  Service: Urology;  Laterality: N/A;  . CYSTOSCOPY WITH URETHRAL DILATATION N/A 04/03/2015   Procedure: CYSTOSCOPY WITH BILATERAL RETROGRADES;  Surgeon: Festus Aloe, MD;  Location: [facility];  Service: Urology;  Laterality: N/A;  . ENDOSCOPIC RETROGRADE CHOLANGIOPANCREATOGRAPHY (ERCP) WITH PROPOFOL N/A 12/29/2017   Procedure: ENDOSCOPIC RETROGRADE CHOLANGIOPANCREATOGRAPHY (ERCP) WITH PROPOFOL;  Surgeon: Clarene Essex, MD;  Location: [facility];  Service: Endoscopy;  Laterality: N/A;  . EUS N/A 08/28/2016   Procedure: UPPER ENDOSCOPIC ULTRASOUND (EUS) RADIAL;  Surgeon: Milus Banister, MD;  Location: [facility];  Service: Endoscopy;  Laterality: N/A;  . flexible cystoscopy    . GOLD SEED IMPLANT N/A 07/03/2017   Procedure: GOLD SEED IMPLANT;  Surgeon: Festus Aloe, MD;  Location: [facility];  Service: Urology;  Laterality: N/A;  Needs Ultrasound Tech  . REMOVAL OF STONES  12/29/2017   Procedure: REMOVAL OF STONES;  Surgeon: Clarene Essex, MD;  Location: [facility];  Service: Endoscopy;;  Balloon Sweep of Duct  . repair of post infarction posterior ventricular septal defect   09/2009  . SPACE OAR INSTILLATION N/A 07/03/2017   Procedure: SPACE OAR INSTILLATION;  Surgeon: Festus Aloe, MD;  Location: [facility];  Service: Urology;  Laterality: N/A;  . SPHINCTEROTOMY  12/29/2017   Procedure: SPHINCTEROTOMY;  Surgeon: Clarene Essex, MD;  Location: [facility];  Service: Endoscopy;;  . TRANSURETHRAL RESECTION OF PROSTATE  06/27/2016   Procedure: TRANSURETHRAL RESECTION OF THE PROSTATE (TURP);  Surgeon: Festus Aloe, MD;  Location: [facility];  Service: Urology;;  . UPPER GI ENDOSCOPY          Home Medications    Prior to Admission medications   Medication Sig Start Date End Date Taking? Authorizing Provider  amLODipine (NORVASC) 10 MG tablet Take 0.5 tablets (5 mg total) by mouth daily. 01/01/18  Yes Roxan Hockey, MD  aspirin EC 81 MG tablet Take 1 tablet (81 mg total) by mouth daily. 09/24/17  Yes Lelon Perla, MD  carvedilol (COREG) 12.5 MG tablet Take 1 tablet (12.5 mg total) by mouth 2 (two) times  daily with a meal. 11/13/17  Yes Crenshaw, Denice Bors, MD  megestrol (MEGACE) 400 MG/10ML suspension Take 10 mLs (400 mg total) by mouth daily. 01/02/18  Yes Emokpae, Courage, MD  ondansetron (ZOFRAN) 4 MG tablet Take 1 tablet (4 mg total) by mouth every 6 (six) hours as needed for nausea or vomiting. 01/25/18  Yes Truitt Merle, MD  rosuvastatin (CRESTOR) 40 MG tablet Take 1 tablet (40 mg total) by mouth daily. 11/13/17  Yes Lelon Perla, MD  sodium bicarbonate 650 MG tablet Take 1 tablet (650 mg total) by mouth 2 (two) times daily. 01/01/18  Yes Emokpae, Courage, MD  tamsulosin (FLOMAX) 0.4 MG CAPS capsule Take 1 capsule (0.4 mg total) by mouth daily after supper. 01/01/18  Yes Emokpae, Courage, MD  oxyCODONE (ROXICODONE) 5 MG immediate release tablet Take 1 tablet (5 mg total) by mouth every 4 (four) hours as needed for severe pain. Patient not taking: Reported on 02/06/2018 01/01/18   Roxan Hockey, MD    Family History Family History    Problem Relation Age of Onset  . Cancer Paternal Aunt        unknown type cancer   . Cancer Paternal Uncle        unknown type cancer    Social History Social History   Tobacco Use  . Smoking status: Former Smoker    Packs/day: 1.50    Years: 45.00    Pack years: 67.50    Types: Cigarettes    Last attempt to quit: 01/13/2010    Years since quitting: 8.0  . Smokeless tobacco: Never Used  Substance Use Topics  . Alcohol use: No    Comment: occasional   . Drug use: No     Allergies   Chlorhexidine gluconate   Review of Systems Review of Systems  Constitutional: Negative for activity change, appetite change, chills and fever.  HENT: Negative for rhinorrhea and sore throat.   Eyes: Negative for visual disturbance.  Respiratory: Negative for cough.   Cardiovascular: Negative for leg swelling.  Gastrointestinal: Positive for abdominal pain and nausea. Negative for blood in stool, diarrhea and vomiting.  Genitourinary: Negative for dysuria.  Musculoskeletal: Negative for back pain and neck pain.  Skin: Negative for rash.  Allergic/Immunologic: Positive for immunocompromised state.  Neurological: Positive for dizziness. Negative for light-headedness and headaches.  Hematological: Does not bruise/bleed easily.  Psychiatric/Behavioral: Negative for confusion.     Physical Exam Updated Vital Signs BP 101/75 (BP Location: Right Arm)   Pulse 92   Temp 98.2 F (36.8 C) (Oral)   Resp 16   SpO2 100%   Physical Exam Vitals signs and nursing note reviewed.  Constitutional:      Appearance: He is well-developed.  HENT:     Head: Normocephalic and atraumatic.     Mouth/Throat:     Mouth: Mucous membranes are dry.  Eyes:     Conjunctiva/sclera: Conjunctivae normal.  Neck:     Musculoskeletal: Neck supple.  Cardiovascular:     Rate and Rhythm: Normal rate and regular rhythm.     Heart sounds: No murmur.  Pulmonary:     Effort: Pulmonary effort is normal. No  respiratory distress.     Breath sounds: Normal breath sounds.  Abdominal:     Palpations: Abdomen is soft.     Tenderness: There is no abdominal tenderness.  Musculoskeletal:        General: No swelling.  Skin:    General: Skin is warm and dry.  Neurological:  General: No focal deficit present.     Mental Status: He is alert. Mental status is at baseline.     Cranial Nerves: No cranial nerve deficit.     Motor: No weakness.     Comments: Subjectively reports a gait abnormality.  Not tested.      ED Treatments / Results  Labs (all labs ordered are listed, but only abnormal results are displayed) Labs Reviewed  COMPREHENSIVE METABOLIC PANEL - Abnormal; Notable for the following components:      Result Value   Sodium 132 (*)    CO2 15 (*)    Glucose, Bld 118 (*)    BUN 47 (*)    Creatinine, Ser 3.52 (*)    Albumin 2.3 (*)    GFR calc non Af Amer 16 (*)    GFR calc Af Amer 19 (*)    All other components within normal limits  CBC - Abnormal; Notable for the following components:   RBC 3.07 (*)    Hemoglobin 7.6 (*)    HCT 25.1 (*)    MCH 24.8 (*)    RDW 18.3 (*)    Platelets 454 (*)    All other components within normal limits  TROPONIN I - Abnormal; Notable for the following components:   Troponin I 0.08 (*)    All other components within normal limits  LIPASE, BLOOD  URINALYSIS, ROUTINE W REFLEX MICROSCOPIC  TYPE AND SCREEN  PREPARE RBC (CROSSMATCH)    EKG EKG Interpretation  Date/Time:  Saturday February 06 2018 19:17:44 EST Ventricular Rate:  88 PR Interval:  188 QRS Duration: 78 QT Interval:  360 QTC Calculation: 435 R Axis:   51 Text Interpretation:  Normal sinus rhythm Nonspecific ST abnormality Nonspecific T wave abnormality `mild st elev noted on prior ecg 10/03/2022, +sl more pronounced today, t inv v3-v6 appears new,  ? acute ischemia (pt denies any chest pain or discomfort) Reconfirmed by Lajean Saver [phone]) on 02/06/2018 8:23:35 PM   Radiology Ct  Abdomen Pelvis Wo Contrast  Result Date: 02/06/2018 CLINICAL DATA:  Pancreatic neoplasm follow-up. Abdominal pain. EXAM: CT ABDOMEN AND PELVIS WITHOUT CONTRAST TECHNIQUE: Multidetector CT imaging of the abdomen and pelvis was performed following the standard protocol without IV contrast. COMPARISON:  None. FINDINGS: Lower chest: The included heart size is enlarged but stable in appearance without pericardial effusion or thickening. Chronic left ventricular calcified aneurysm is identified the inferior wall. Lung bases are free of pulmonary consolidations. No effusion or pneumothorax is identified. Hepatobiliary: Pneumobilia is identified compatible with recent intervention with common bowel duct stent in place. Small amount of perihepatic ascites is noted, new since prior. Gallbladder is unremarkable and free of stones. No space-occupying mass of the liver is identified given limitations of a noncontrast study. Pancreas: Peripancreatic edema is identified, new since prior raising concern for changes of acute pancreatitis. No pseudocyst formation or ductal dilatation of the pancreatic gland is identified. Surgical clips are again noted in the neck of the pancreas. Spleen: New perisplenic ascites. No splenomegaly. Adrenals/Urinary Tract: Normal bilateral adrenal glands. Subtle bilateral renal hypodensities compatible with renal cysts some which are too small to characterize. In exophytic indeterminate hypodensities noted off the lateral aspect of the left kidney, stable in appearance measuring up to 8 mm. Chronic mild ectasia of the renal collecting systems with diffuse thick-walled appearance of the urinary bladder, raising cystitis as a possibility for this appearance. Stomach/Bowel: Stomach is within normal limits. Appendix appears normal. There is mild anorectal soft tissue  thickening likely due to underdistention. Internal hemorrhoids are a possibility as well. Vascular/Lymphatic: Aortoiliac and branch vessel  atherosclerosis. Soft tissue densities in the porta hepatis can not exclude porta hepatic adenopathy. No significant progression is identified, the largest approximately 13 mm. No enlarged retroperitoneal or pelvic lymph nodes. Reproductive: Metallic clips noted in the prostate with mild prostatic enlargement. This is stable. Other: Mild mesenteric edema is noted Musculoskeletal: Stable multilevel biconcave likely osteoporotic mild compressions of along the lumbar spine. No aggressive osseous lesions. Stable lipomas of the external oblique muscles bilaterally. IMPRESSION: 1. Interval development of peripancreatic edema with new small volume of perihepatic and perisplenic ascites. Findings are in keeping with acute pancreatitis. No pseudocyst formation or ductal dilatation is identified. 2. New CBD stent in place with resultant pneumobilia from recent intervention. 3. Bilateral renal hypodensities some which appear to represent simple cysts and others are indeterminate possibly proteinaceous or complex. 4. Mild anorectal soft tissue thickening, felt in part due to underdistention. Internal hemorrhoids are a possibility as well. Proctocolitis is believed less likely. Electronically Signed   By: Ashley Royalty M.D.   On: 02/06/2018 21:13   Dg Chest 2 View  Result Date: 02/06/2018 CLINICAL DATA:  Prostate and pancreatic cancer. Patient reports abdominal pain. Weakness and dizziness. EXAM: CHEST - 2 VIEW COMPARISON:  Chest CT 11/25/2016. Lung bases from abdominal CT performed concurrently. FINDINGS: Post median sternotomy and CABG. Low lung volumes with prominent heart size, accentuated by hypoaeration. No pulmonary edema, focal airspace disease, pleural effusion or pneumothorax. No acute osseous abnormalities. IMPRESSION: Low lung volumes without acute abnormality. Electronically Signed   By: Keith Rake M.D.   On: 02/06/2018 21:06   Ct Head Wo Contrast  Addendum Date: 02/06/2018   ADDENDUM REPORT: 02/06/2018  21:31 ADDENDUM: These results were called by telephone at the time of interpretation on 02/06/2018 at 9:21 pm to Dr. Fredia Sorrow , who verbally acknowledged these results. Electronically Signed   By: Fidela Salisbury M.D.   On: 02/06/2018 21:31   Result Date: 02/06/2018 CLINICAL DATA:  Ataxia.  Clinical suspicion for stroke. EXAM: CT HEAD WITHOUT CONTRAST TECHNIQUE: Contiguous axial images were obtained from the base of the skull through the vertex without intravenous contrast. COMPARISON:  None. FINDINGS: Brain: Area of hypoattenuation in the right occipital lobe, likely represents age-indeterminate ischemic infarction. No evidence of intracranial hemorrhage, mass effect. Normal ventricular size. Vascular: Calcific atherosclerotic disease at the skull base. Skull: Normal. Negative for fracture or focal lesion. Sinuses/Orbits: Near complete opacification of the maxillary sinuses with chronic periosteal reaction and remodeling of the sinus walls. Near complete opacification of the left ethmoid sinuses with expansile appearance. Mucosal thickening of the right ethmoid sinus and bilateral sphenoid sinuses. Milder mucosal thickening of the frontal sinuses. Other: None. IMPRESSION: Age-indeterminate ischemic infarction in the right occipital lobe. Pansinusitis with chronic appearance. Fungal sinusitis is not excluded. Electronically Signed: By: Fidela Salisbury M.D. On: 02/06/2018 21:01    Procedures Procedures (including critical care time)  CRITICAL CARE Performed by: Fredia Sorrow Total critical care time: 30 minutes Critical care time was exclusive of separately billable procedures and treating other patients. Critical care was necessary to treat or prevent imminent or life-threatening deterioration. Critical care was time spent personally by me on the following activities: development of treatment plan with patient and/or surrogate as well as nursing, discussions with consultants, evaluation  of patient's response to treatment, examination of patient, obtaining history from patient or surrogate, ordering and performing treatments and interventions, ordering and  review of laboratory studies, ordering and review of radiographic studies, pulse oximetry and re-evaluation of patient's condition.   Medications Ordered in ED Medications  sodium chloride flush (NS) 0.9 % injection 3 mL (has no administration in time range)  sodium chloride 0.9 % bolus 500 mL (500 mLs Intravenous New Bag/Given 02/06/18 2248)  0.9 %  sodium chloride infusion (has no administration in time range)  0.9 %  sodium chloride infusion (has no administration in time range)  HYDROmorphone (DILAUDID) injection 0.5 mg (0.5 mg Intravenous Given 02/06/18 2250)  ondansetron (ZOFRAN) injection 4 mg (4 mg Intravenous Given 02/06/18 2246)     Initial Impression / Assessment and Plan / ED Course  I have reviewed the triage vital signs and the nursing notes.  Pertinent labs & imaging results that were available during my care of the patient were reviewed by me and considered in my medical decision making (see chart for details).    Patient with a history of pancreatic mass metastatic prostate cancer.  Seems to be lost to follow-up somewhat.  Seemed to supposed to have an appointment on January 18 with heme-onc.  There was some discussion about palliative care.  Family seems not to be in the loop on this.  Patient states for the past 2 weeks he has been having trouble with balance.  This is probably explained by the subacute infarct in the upper soft of typical part of the brain.  Discussed with neuro hospitalist they are aware.  Patient also with anemia but no evidence of any blood in the bowel movements.  Oral intake and food intake is been poor with decreased appetite talks about having nausea occasional vomiting but no vomiting for 2 days.  States does not really have much in the way of a bowel movement.  Patient's troponin is  also elevated.  Patient without complaint of chest pain.  Suspect troponin may be up due to the cancer.  Chest x-ray without acute findings CT scan of the abdomen shows the stent in place but nothing new or acute.  Serial troponins will be important.  Patient had the last one at 8 so is ready for another one now.  Will order it.  Discussed with hospitalist they will admit.  Patient with persistent renal insufficiency.  Not much worse than baseline.  Mild hyponatremia.  For the anemia patient has 2 units of blood ordered.  Hemoglobin was 7.5.   Final Clinical Impressions(s) / ED Diagnoses   Final diagnoses:  Cerebrovascular accident (CVA), unspecified mechanism ([place])  Anemia, unspecified type    ED Discharge Orders    None       Fredia Sorrow, MD 02/06/18 2320

## 2018-02-07 ENCOUNTER — Observation Stay (HOSPITAL_BASED_OUTPATIENT_CLINIC_OR_DEPARTMENT_OTHER): Payer: Medicare Other

## 2018-02-07 ENCOUNTER — Observation Stay (HOSPITAL_COMMUNITY): Payer: Medicare Other

## 2018-02-07 DIAGNOSIS — Z515 Encounter for palliative care: Secondary | ICD-10-CM | POA: Diagnosis not present

## 2018-02-07 DIAGNOSIS — R7989 Other specified abnormal findings of blood chemistry: Secondary | ICD-10-CM | POA: Diagnosis not present

## 2018-02-07 DIAGNOSIS — D649 Anemia, unspecified: Secondary | ICD-10-CM | POA: Diagnosis not present

## 2018-02-07 DIAGNOSIS — I361 Nonrheumatic tricuspid (valve) insufficiency: Secondary | ICD-10-CM | POA: Diagnosis not present

## 2018-02-07 DIAGNOSIS — I6389 Other cerebral infarction: Secondary | ICD-10-CM | POA: Diagnosis not present

## 2018-02-07 DIAGNOSIS — Z7189 Other specified counseling: Secondary | ICD-10-CM

## 2018-02-07 DIAGNOSIS — C250 Malignant neoplasm of head of pancreas: Secondary | ICD-10-CM | POA: Diagnosis not present

## 2018-02-07 DIAGNOSIS — R778 Other specified abnormalities of plasma proteins: Secondary | ICD-10-CM

## 2018-02-07 DIAGNOSIS — R29818 Other symptoms and signs involving the nervous system: Secondary | ICD-10-CM | POA: Diagnosis not present

## 2018-02-07 DIAGNOSIS — R627 Adult failure to thrive: Secondary | ICD-10-CM

## 2018-02-07 DIAGNOSIS — I2510 Atherosclerotic heart disease of native coronary artery without angina pectoris: Secondary | ICD-10-CM | POA: Diagnosis not present

## 2018-02-07 LAB — CBC
HCT: 29.4 % — ABNORMAL LOW (ref 39.0–52.0)
Hemoglobin: 9.4 g/dL — ABNORMAL LOW (ref 13.0–17.0)
MCH: 26 pg (ref 26.0–34.0)
MCHC: 32 g/dL (ref 30.0–36.0)
MCV: 81.4 fL (ref 80.0–100.0)
Platelets: 366 10*3/uL (ref 150–400)
RBC: 3.61 MIL/uL — ABNORMAL LOW (ref 4.22–5.81)
RDW: 17.3 % — ABNORMAL HIGH (ref 11.5–15.5)
WBC: 9.6 10*3/uL (ref 4.0–10.5)
nRBC: 0 % (ref 0.0–0.2)

## 2018-02-07 LAB — TROPONIN I
Troponin I: 0.09 ng/mL (ref <0.03)
Troponin I: 0.24 ng/mL (ref <0.03)
Troponin I: 0.19 ng/mL (ref <0.03)

## 2018-02-07 LAB — COMPREHENSIVE METABOLIC PANEL
ALT: 12 U/L (ref 0–44)
AST: 18 U/L (ref 15–41)
Albumin: 2.3 g/dL — ABNORMAL LOW (ref 3.5–5.0)
Alkaline Phosphatase: 57 U/L (ref 38–126)
Anion gap: 9 (ref 5–15)
BILIRUBIN TOTAL: 0.7 mg/dL (ref 0.3–1.2)
BUN: 44 mg/dL — ABNORMAL HIGH (ref 8–23)
CO2: 18 mmol/L — ABNORMAL LOW (ref 22–32)
Calcium: 9.4 mg/dL (ref 8.9–10.3)
Chloride: 105 mmol/L (ref 98–111)
Creatinine, Ser: 3.62 mg/dL — ABNORMAL HIGH (ref 0.61–1.24)
GFR calc Af Amer: 18 mL/min — ABNORMAL LOW (ref >60)
GFR calc non Af Amer: 16 mL/min — ABNORMAL LOW (ref >60)
GLUCOSE: 134 mg/dL — AB (ref 70–99)
Potassium: 4.2 mmol/L (ref 3.5–5.1)
Sodium: 132 mmol/L — ABNORMAL LOW (ref 135–145)
Total Protein: 7.5 g/dL (ref 6.5–8.1)

## 2018-02-07 LAB — PREPARE RBC (CROSSMATCH)

## 2018-02-07 LAB — ECHOCARDIOGRAM COMPLETE

## 2018-02-07 MED ORDER — SODIUM CHLORIDE 0.9 % IV SOLN: INTRAVENOUS | Status: DC

## 2018-02-07 MED ORDER — ASPIRIN EC 81 MG PO TBEC
81.0000 mg | DELAYED_RELEASE_TABLET | Freq: Every day | ORAL | Status: DC
  Administered 2018-02-08: 81 mg via ORAL
  Filled 2018-02-07: qty 1

## 2018-02-07 MED ORDER — SODIUM BICARBONATE 650 MG PO TABS
650.0000 mg | ORAL_TABLET | Freq: Two times a day (BID) | ORAL | Status: DC
  Administered 2018-02-07 – 2018-02-08 (×4): 650 mg via ORAL
  Filled 2018-02-07 (×4): qty 1

## 2018-02-07 MED ORDER — ROSUVASTATIN CALCIUM 20 MG PO TABS
40.0000 mg | ORAL_TABLET | Freq: Every day | ORAL | Status: DC
  Administered 2018-02-07: 40 mg via ORAL
  Filled 2018-02-07 (×2): qty 2

## 2018-02-07 MED ORDER — ONDANSETRON HCL 4 MG/2ML IJ SOLN: 4.0000 mg | Freq: Four times a day (QID) | INTRAMUSCULAR | Status: DC | PRN

## 2018-02-07 MED ORDER — ONDANSETRON HCL 4 MG PO TABS: 4.0000 mg | ORAL_TABLET | Freq: Four times a day (QID) | ORAL | Status: DC | PRN

## 2018-02-07 MED ORDER — ACETAMINOPHEN 325 MG PO TABS: 650.0000 mg | ORAL_TABLET | Freq: Four times a day (QID) | ORAL | Status: DC | PRN

## 2018-02-07 MED ORDER — ASPIRIN EC 81 MG PO TBEC: 81.0000 mg | DELAYED_RELEASE_TABLET | Freq: Every day | ORAL | Status: DC

## 2018-02-07 MED ORDER — ASPIRIN EC 325 MG PO TBEC
325.0000 mg | DELAYED_RELEASE_TABLET | Freq: Every day | ORAL | Status: DC
  Administered 2018-02-07: 325 mg via ORAL
  Filled 2018-02-07: qty 1

## 2018-02-07 MED ORDER — ACETAMINOPHEN 650 MG RE SUPP: 650.0000 mg | Freq: Four times a day (QID) | RECTAL | Status: DC | PRN

## 2018-02-07 NOTE — Progress Notes (Signed)
  Echocardiogram 2D Echocardiogram has been performed.  Bobbye Charleston 02/07/2018, 1:59 PM

## 2018-02-07 NOTE — Progress Notes (Signed)
STROKE TEAM PROGRESS NOTE   SUBJECTIVE (INTERVAL HISTORY) No family is at the bedside.  Patient lying in bed, no acute distress.  He stated that his appetite much improved, no nausea or vomiting and he feels good, he denies any history of stroke in the past.  MRI showed no acute infarct this time.  The stroke show on CT at right posterior MCA territory was chronic infarct.  OBJECTIVE Vitals:   02/07/18 1147 02/07/18 1345 02/07/18 1428 02/07/18 1429  BP: 128/82 132/82 133/82 133/82  Pulse: 76 78 78 78  Resp: 18  18 18   Temp: 98.4 F (36.9 C)  98.4 F (36.9 C) 98.4 F (36.9 C)  TempSrc: Oral  Oral Oral  SpO2: 100%     Weight:    72.4 kg  Height:   5\' 7"  (1.702 m) 5\' 7"  (1.702 m)    CBC:  Recent Labs  Lab 02/06/18 1942 02/07/18 1103  WBC 8.0 9.6  HGB 7.6* 9.4*  HCT 25.1* 29.4*  MCV 81.8 81.4  PLT 454* 673    Basic Metabolic Panel:  Recent Labs  Lab 02/06/18 1942 02/07/18 1103  NA 132* 132*  K 3.8 4.2  CL 104 105  CO2 15* 18*  GLUCOSE 118* 134*  BUN 47* 44*  CREATININE 3.52* 3.62*  CALCIUM 9.3 9.4    Lipid Panel:     Component Value Date/Time   CHOL 84 09/06/2013 0834   TRIG 115 09/06/2013 0834   HDL 27 (L) 09/06/2013 0834   CHOLHDL 3.1 09/06/2013 0834   VLDL 23 09/06/2013 0834   LDLCALC 34 09/06/2013 0834   HgbA1c:  Lab Results  Component Value Date   HGBA1C (H) 10/08/2009    6.1 (NOTE)                                                                       According to the ADA Clinical Practice Recommendations for 2011, when HbA1c is used as a screening test:   >=6.5%   Diagnostic of Diabetes Mellitus           (if abnormal result  is confirmed)  5.7-6.4%   Increased risk of developing Diabetes Mellitus  References:Diagnosis and Classification of Diabetes Mellitus,Diabetes ALPF,7902,40(XBDZH 1):S62-S69 and Standards of Medical Care in         Diabetes - 2011,Diabetes Care,2011,34  (Suppl 1):S11-S61.   Urine Drug Screen:     Component Value Date/Time    LABOPIA NONE DETECTED 10/08/2009 0125   COCAINSCRNUR NONE DETECTED 10/08/2009 0125   LABBENZ NONE DETECTED 10/08/2009 0125   AMPHETMU NONE DETECTED 10/08/2009 0125   THCU NONE DETECTED 10/08/2009 0125   LABBARB  10/08/2009 0125    NONE DETECTED        DRUG SCREEN FOR MEDICAL PURPOSES ONLY.  IF CONFIRMATION IS NEEDED FOR ANY PURPOSE, NOTIFY LAB WITHIN 5 DAYS.        LOWEST DETECTABLE LIMITS FOR URINE DRUG SCREEN Drug Class       Cutoff (ng/mL) Amphetamine      1000 Barbiturate      200 Benzodiazepine   299 Tricyclics       242 Opiates          300 Cocaine  300 THC              50    Alcohol Level No results found for: [facility]  IMAGING  Ct Abdomen Pelvis Wo Contrast 02/06/2018 IMPRESSION:  1. Interval development of peripancreatic edema with new small volume of perihepatic and perisplenic ascites. Findings are in keeping with acute pancreatitis. No pseudocyst formation or ductal dilatation is identified.  2. New CBD stent in place with resultant pneumobilia from recent intervention.  3. Bilateral renal hypodensities some which appear to represent simple cysts and others are indeterminate possibly proteinaceous or complex.  4. Mild anorectal soft tissue thickening, felt in part due to underdistention. Internal hemorrhoids are a possibility as well. Proctocolitis is believed less likely.   Dg Chest 2 View 02/06/2018 IMPRESSION:  Low lung volumes without acute abnormality.   Ct Head Wo Contrast 02/06/2018   IMPRESSION:  Age-indeterminate ischemic infarction in the right occipital lobe.  Pansinusitis with chronic appearance. Fungal sinusitis is not excluded.    Mr [name] Head Wo Contrast 02/07/2018 IMPRESSION:   Brain MRI:  1. No acute finding. Remote right occipital infarct.  2. Chronic active sinusitis.   MRA: Atherosclerosis without branch occlusion or flow limiting stenosis.   Transthoracic Echocardiogram  02/07/2018 Study Conclusions - Left ventricle: The  cavity size was normal. There was focal basal   hypertrophy. Systolic function was normal. The estimated ejection   fraction was in the range of 55% to 60%. Doppler parameters are   consistent with abnormal left ventricular relaxation (grade 1   diastolic dysfunction). The distal inferoseptal/apical inferior   walls are severely scarred/thinned and is aneurysmal. No   evidenence of VSD at this area. - Regional wall motion abnormality: Akinesis of the apical inferior   and apical septal myocardium. - Aortic valve: Mildly calcified annulus. Trileaflet; mildly   thickened leaflets. Valve area (VTI): 2.16 cm^2. Valve area   (Vmax): 2.18 cm^2. Valve area (Vmean): 1.83 cm^2. - Mitral valve: Mildly calcified annulus. Normal thickness leaflets - Right ventricle: Systolic function was mildly reduced. - Atrial septum: No defect or patent foramen ovale was identified.   Bilateral Carotid Dopplers  00/00/00 Pending    PHYSICAL EXAM  Temp:  [97.9 F (36.6 C)-99.5 F (37.5 C)] 98.4 F (36.9 C) (01/26 1429) Pulse Rate:  [71-92] 79 (01/26 1614) Resp:  [10-18] 16 (01/26 1614) BP: (85-133)/(69-89) 119/77 (01/26 1614) SpO2:  [100 %] 100 % (01/26 1147) Weight:  [72.4 kg] 72.4 kg (01/26 1429)  General - Well nourished, well developed, in no apparent distress.  Ophthalmologic - fundi not visualized due to noncooperation.  Cardiovascular - Regular rate and rhythm.  Mental Status -  Level of arousal and orientation to month, place, and person were intact, but not to year. Language including expression, naming, repetition, comprehension was assessed and found intact. Fund of Knowledge was assessed and was intact.  Cranial Nerves II - XII - II - Visual field intact OU. III, IV, VI - Extraocular movements intact. V - Facial sensation intact bilaterally. VII - Facial movement intact bilaterally. VIII - Hearing & vestibular intact bilaterally. X - Palate elevates symmetrically. XI - Chin  turning & shoulder shrug intact bilaterally. XII - Tongue protrusion intact.  Motor Strength - The patient's strength was normal in all extremities and pronator drift was absent.  Bulk was normal and fasciculations were absent.   Motor Tone - Muscle tone was assessed at the neck and appendages and was normal.  Reflexes - The patient's reflexes were  symmetrical in all extremities and he had no pathological reflexes.  Sensory - Light touch, temperature/pinprick were assessed and were symmetrical.    Coordination - The patient had normal movements in the hands and feet with no ataxia or dysmetria.  Tremor was absent.  Gait and Station - deferred.    ASSESSMENT/PLAN [name] is a 73 y.o. male with history of CHF, CAD with MI in 2011 causing VSD s/p VSD repair, prostate cancer status post TURP and radiation, pancreatic cancer status post SBRT, HTN, HLD, DVT history, CKD presenting with fatigue, decreased appetite, N/V, dizziness  and 2 weeks of gait imbalance. He did not receive IV t-PA due to late presentation.  Failure to thrive  Likely multifactorial, likely due to CKD with elevated creatinine, hyponatremia sodium 132, anemia with hemoglobin 7.6 status post PRBC transfusion  Clinically improved overnight  Treatment per primary team  Chronic infarct - Remote right occipital infarct - source unknown, could be related to previous extensive cardiac events in 2011 including MI and causing VSD status post VSD repair as well as stent post CABG  Resultant at baseline  CT head - Age-indeterminate ischemic infarction in the right occipital lobe.   MRI head - No acute finding. Remote right occipital infarct.   MRA head - Atherosclerosis without branch occlusion or flow limiting stenosis.   Carotid Doppler - pending  2D Echo  - EF 55 - 60%. No cardiac source of emboli identified.   LDL - pending  HgbA1c - pending  UDS - not performed  VTE prophylaxis - SCDs  Diet -  regular  aspirin 81 mg daily prior to admission, now on aspirin 81 mg daily.  Continue aspirin on discharge  Patient counseled to be compliant with his antithrombotic medications  Ongoing aggressive stroke risk factor management  Therapy recommendations:  pending  Disposition:  Pending  Hypertension  Stable . Long-term BP goal normotensive  Hyperlipidemia  Lipid lowering medication PTA:  Crestor 40 mg daily  LDL pending, goal < 70  Current lipid lowering medication: Crestor 40 mg daily  Continue statin at discharge   Other Stroke Risk Factors  Advanced age  Former cigarette smoker - quit 8 years ago  CAD/MI status post CABG and a VSD repair  CHF  History of DVT  Other Active Problems  Pansinusitis with chronic appearance  Anemia due to CKD- Hb 7.6-> PRBC ->9.4  CKD stage IV- 3.52->3.62  Hyponatremia Na 132  Elevated troponins - 0.08->0.09  Pancreatic cancer status post SBRT  Prostate cancer status post TURP and radiation  Hospital day # 0  Rosalin Hawking, MD PhD Stroke Neurology 02/07/2018 5:28 PM    To contact Stroke Continuity provider, please refer to [email or web address] After hours, contact General Neurology

## 2018-02-07 NOTE — Progress Notes (Addendum)
Progress Note    [name]  WYO:[id number] DOB: 01-21-1945  DOA: 02/06/2018 PCP: Patient, No Pcp Per    Brief Narrative:     Medical records reviewed and are as summarized below:  [name] is an 73 y.o. male  with medical history significant for pancreatic cancer s/p SBRT, prostate cancer s/p TURP and XRT, CAD status post CABG, CKD stage IV, VSD status post repair, hypertension, hyperlipidemia, and recent admission for obstructive jaundice status post biliary stent placement who is admitted to the ED with anemia, elevated troponin, and failure to thrive.  Assessment/Plan:   Principal Problem:   CVA (cerebral vascular accident) ([place]) Active Problems:   Coronary atherosclerosis of native coronary artery   Anemia   Pancreatic cancer ([place])   CKD (chronic kidney disease), stage IV ([facility])   Elevated troponin   Pancreatic cancer s/p SBRT and biliary stent placement for recent obstructive jaundice: LFTs have normalized compared to prior.  Prior oncology note reviewed and she does not a candidate for further surgical intervention or chemotherapy.  Patient was seen by palliative care on last admission. -per discharge note from Dr. Denton Brick: patient declines further in the intervention at this time, patient wants to go home with home aide services to spend whatever time he has left with family  Per note from Dr. Burr Medico On 12/19: Due to his worsening performance status, multiple comorbidities, I do not think he is a candidate for pancreatic surgery or chemotherapy. Pt did not pursue pancreatic surgery last year after chemo and radiation. -I discussed hospice with patient, which is probably the better home care service for him. I will consult palliative care service to help his transition, he is open to the discussion, although he did not tell me if he wants to not. He wants to talk to his sons also.    MRI shows remote CVA  CT scan showing some acute pancreatitis? -lipase normal and  exam not consistent with this  Elevated troponin - likely demand ischemia -not a candidate for invasive intervention due to the above issue  Acute on chronic anemia-- ? From CKD -s/p 2 units PRBC  CKD stage IV: Currently stable, continue monitor.  Hypertension: monitor  Hyperlipidemia: Continue rosuvastatin-- ? Need for this going forward in light of limited time  Failure to thrive: Patient with poor appetite and oral intake secondary to multiple comorbidities including pancreatic and prostate cancer  - needs further goals of care and discharge planning- ? If patient has PCP-- no noted found and not listed  Family Communication/Anticipated D/C date and plan/Code Status   DVT prophylaxis: scd Code Status: Full Code.  Family Communication: no family available Disposition Plan: needs clarification of goals-- ? Full hospice/comfort care   Medical Consultants:    Palliative care   Subjective:   Thinks his stroke is 43 weeks old  Objective:    Vitals:   02/07/18 1147 02/07/18 1345 02/07/18 1428 02/07/18 1429  BP: 128/82 132/82 133/82 133/82  Pulse: 76 78 78 78  Resp: 18  18 18   Temp: 98.4 F (36.9 C)  98.4 F (36.9 C) 98.4 F (36.9 C)  TempSrc: Oral  Oral Oral  SpO2: 100%     Weight:    72.4 kg  Height:   5\' 7"  (1.702 m) 5\' 7"  (1.702 m)    Intake/Output Summary (Last 24 hours) at 02/07/2018 1504 Last data filed at 02/07/2018 1200 Gross per 24 hour  Intake 2049 ml  Output 310 ml  Net 1739 ml   Filed Weights   02/07/18 1429  Weight: 72.4 kg    Exam: Ill appearing male No increased work of breathing Alert  +BS, minimal tenderness  Data Reviewed:   I have personally reviewed following labs and imaging studies:  Labs: Labs show the following:   Basic Metabolic Panel: Recent Labs  Lab 02/06/18 1942 02/07/18 1103  NA 132* 132*  K 3.8 4.2  CL 104 105  CO2 15* 18*  GLUCOSE 118* 134*  BUN 47* 44*  CREATININE 3.52* 3.62*  CALCIUM 9.3 9.4    GFR Estimated Creatinine Clearance: 16.7 mL/min (A) (by C-G formula based on SCr of 3.62 mg/dL (H)). Liver Function Tests: Recent Labs  Lab 02/06/18 1942 02/07/18 1103  AST 17 18  ALT 12 12  ALKPHOS 60 57  BILITOT 0.6 0.7  PROT 7.8 7.5  ALBUMIN 2.3* 2.3*   Recent Labs  Lab 02/06/18 1942  LIPASE 21   No results for input(s): AMMONIA in the last 168 hours. Coagulation profile No results for input(s): INR, PROTIME in the last 168 hours.  CBC: Recent Labs  Lab 02/06/18 1942 02/07/18 1103  WBC 8.0 9.6  HGB 7.6* 9.4*  HCT 25.1* 29.4*  MCV 81.8 81.4  PLT 454* 366   Cardiac Enzymes: Recent Labs  Lab 02/06/18 1950 02/07/18 0057 02/07/18 1103  TROPONINI 0.08* 0.09* 0.24*   BNP (last 3 results) No results for input(s): PROBNP in the last 8760 hours. CBG: No results for input(s): GLUCAP in the last 168 hours. D-Dimer: No results for input(s): DDIMER in the last 72 hours. Hgb A1c: No results for input(s): HGBA1C in the last 72 hours. Lipid Profile: No results for input(s): CHOL, HDL, LDLCALC, TRIG, CHOLHDL, LDLDIRECT in the last 72 hours. Thyroid function studies: No results for input(s): TSH, T4TOTAL, T3FREE, THYROIDAB in the last 72 hours.  Invalid input(s): FREET3 Anemia work up: No results for input(s): VITAMINB12, FOLATE, FERRITIN, TIBC, IRON, RETICCTPCT in the last 72 hours. Sepsis Labs: Recent Labs  Lab 02/06/18 1942 02/07/18 1103  WBC 8.0 9.6    Microbiology No results found for this or any previous visit (from the past 240 hour(s)).  Procedures and diagnostic studies:  Ct Abdomen Pelvis Wo Contrast  Result Date: 02/06/2018 CLINICAL DATA:  Pancreatic neoplasm follow-up. Abdominal pain. EXAM: CT ABDOMEN AND PELVIS WITHOUT CONTRAST TECHNIQUE: Multidetector CT imaging of the abdomen and pelvis was performed following the standard protocol without IV contrast. COMPARISON:  None. FINDINGS: Lower chest: The included heart size is enlarged but stable  in appearance without pericardial effusion or thickening. Chronic left ventricular calcified aneurysm is identified the inferior wall. Lung bases are free of pulmonary consolidations. No effusion or pneumothorax is identified. Hepatobiliary: Pneumobilia is identified compatible with recent intervention with common bowel duct stent in place. Small amount of perihepatic ascites is noted, new since prior. Gallbladder is unremarkable and free of stones. No space-occupying mass of the liver is identified given limitations of a noncontrast study. Pancreas: Peripancreatic edema is identified, new since prior raising concern for changes of acute pancreatitis. No pseudocyst formation or ductal dilatation of the pancreatic gland is identified. Surgical clips are again noted in the neck of the pancreas. Spleen: New perisplenic ascites. No splenomegaly. Adrenals/Urinary Tract: Normal bilateral adrenal glands. Subtle bilateral renal hypodensities compatible with renal cysts some which are too small to characterize. In exophytic indeterminate hypodensities noted off the lateral aspect of the left kidney, stable in appearance measuring up to 8 mm. Chronic  mild ectasia of the renal collecting systems with diffuse thick-walled appearance of the urinary bladder, raising cystitis as a possibility for this appearance. Stomach/Bowel: Stomach is within normal limits. Appendix appears normal. There is mild anorectal soft tissue thickening likely due to underdistention. Internal hemorrhoids are a possibility as well. Vascular/Lymphatic: Aortoiliac and branch vessel atherosclerosis. Soft tissue densities in the porta hepatis can not exclude porta hepatic adenopathy. No significant progression is identified, the largest approximately 13 mm. No enlarged retroperitoneal or pelvic lymph nodes. Reproductive: Metallic clips noted in the prostate with mild prostatic enlargement. This is stable. Other: Mild mesenteric edema is noted  Musculoskeletal: Stable multilevel biconcave likely osteoporotic mild compressions of along the lumbar spine. No aggressive osseous lesions. Stable lipomas of the external oblique muscles bilaterally. IMPRESSION: 1. Interval development of peripancreatic edema with new small volume of perihepatic and perisplenic ascites. Findings are in keeping with acute pancreatitis. No pseudocyst formation or ductal dilatation is identified. 2. New CBD stent in place with resultant pneumobilia from recent intervention. 3. Bilateral renal hypodensities some which appear to represent simple cysts and others are indeterminate possibly proteinaceous or complex. 4. Mild anorectal soft tissue thickening, felt in part due to underdistention. Internal hemorrhoids are a possibility as well. Proctocolitis is believed less likely. Electronically Signed   By: Ashley Royalty M.D.   On: 02/06/2018 21:13   Dg Chest 2 View  Result Date: 02/06/2018 CLINICAL DATA:  Prostate and pancreatic cancer. Patient reports abdominal pain. Weakness and dizziness. EXAM: CHEST - 2 VIEW COMPARISON:  Chest CT 11/25/2016. Lung bases from abdominal CT performed concurrently. FINDINGS: Post median sternotomy and CABG. Low lung volumes with prominent heart size, accentuated by hypoaeration. No pulmonary edema, focal airspace disease, pleural effusion or pneumothorax. No acute osseous abnormalities. IMPRESSION: Low lung volumes without acute abnormality. Electronically Signed   By: Keith Rake M.D.   On: 02/06/2018 21:06   Ct Head Wo Contrast  Addendum Date: 02/06/2018   ADDENDUM REPORT: 02/06/2018 21:31 ADDENDUM: These results were called by telephone at the time of interpretation on 02/06/2018 at 9:21 pm to Dr. Fredia Sorrow , who verbally acknowledged these results. Electronically Signed   By: Fidela Salisbury M.D.   On: 02/06/2018 21:31   Result Date: 02/06/2018 CLINICAL DATA:  Ataxia.  Clinical suspicion for stroke. EXAM: CT HEAD WITHOUT CONTRAST  TECHNIQUE: Contiguous axial images were obtained from the base of the skull through the vertex without intravenous contrast. COMPARISON:  None. FINDINGS: Brain: Area of hypoattenuation in the right occipital lobe, likely represents age-indeterminate ischemic infarction. No evidence of intracranial hemorrhage, mass effect. Normal ventricular size. Vascular: Calcific atherosclerotic disease at the skull base. Skull: Normal. Negative for fracture or focal lesion. Sinuses/Orbits: Near complete opacification of the maxillary sinuses with chronic periosteal reaction and remodeling of the sinus walls. Near complete opacification of the left ethmoid sinuses with expansile appearance. Mucosal thickening of the right ethmoid sinus and bilateral sphenoid sinuses. Milder mucosal thickening of the frontal sinuses. Other: None. IMPRESSION: Age-indeterminate ischemic infarction in the right occipital lobe. Pansinusitis with chronic appearance. Fungal sinusitis is not excluded. Electronically Signed: By: Fidela Salisbury M.D. On: 02/06/2018 21:01   Mr [name] Head Wo Contrast  Result Date: 02/07/2018 CLINICAL DATA:  Focal neuro deficit with stroke suspected EXAM: MRI HEAD WITHOUT CONTRAST MRA HEAD WITHOUT CONTRAST TECHNIQUE: Multiplanar, multiecho pulse sequences of the brain and surrounding structures were obtained without intravenous contrast. Angiographic images of the head were obtained using MRA technique without contrast. COMPARISON:  Head  CT from yesterday FINDINGS: MRI HEAD FINDINGS Coronal T2 imaging was not acquired. Brain: No acute infarction, hemorrhage, hydrocephalus, extra-axial collection or mass lesion. Moderate remote right occipital cortically based infarct. Generalized atrophy. Mild chronic small vessel ischemia in the cerebral white matter. Small remote left precentral gyrus cortex infarct along hand function. Vascular: Arterial findings below. Skull and upper cervical spine: Negative for marrow lesion  Sinuses/Orbits: Chronic sinusitis with mucosal thickening and sclerotic wall thickening by CT. Mild mucosal thickening along the mastoids bilaterally. Negative nasopharynx. MRA HEAD FINDINGS There is artifact from slab interface and dephasing. Lobulation of the bilateral carotid siphons likely from atherosclerosis. No intradural aneurysm. Dominant right vertebral artery. No branch occlusion or proximal flow limiting stenosis. Signal within the left transverse and sigmoid sinus appears to originate in the neck, possibly venous reflux. No dural fistula or AVM is seen. IMPRESSION: Brain MRI: 1. No acute finding.  Remote right occipital infarct. 2. Chronic active sinusitis. Intracranial MRA: Atherosclerosis without branch occlusion or flow limiting stenosis. Electronically Signed   By: Monte Fantasia M.D.   On: 02/07/2018 11:23   Mr Brain Wo Contrast  Result Date: 02/07/2018 CLINICAL DATA:  Focal neuro deficit with stroke suspected EXAM: MRI HEAD WITHOUT CONTRAST MRA HEAD WITHOUT CONTRAST TECHNIQUE: Multiplanar, multiecho pulse sequences of the brain and surrounding structures were obtained without intravenous contrast. Angiographic images of the head were obtained using MRA technique without contrast. COMPARISON:  Head CT from yesterday FINDINGS: MRI HEAD FINDINGS Coronal T2 imaging was not acquired. Brain: No acute infarction, hemorrhage, hydrocephalus, extra-axial collection or mass lesion. Moderate remote right occipital cortically based infarct. Generalized atrophy. Mild chronic small vessel ischemia in the cerebral white matter. Small remote left precentral gyrus cortex infarct along hand function. Vascular: Arterial findings below. Skull and upper cervical spine: Negative for marrow lesion Sinuses/Orbits: Chronic sinusitis with mucosal thickening and sclerotic wall thickening by CT. Mild mucosal thickening along the mastoids bilaterally. Negative nasopharynx. MRA HEAD FINDINGS There is artifact from slab  interface and dephasing. Lobulation of the bilateral carotid siphons likely from atherosclerosis. No intradural aneurysm. Dominant right vertebral artery. No branch occlusion or proximal flow limiting stenosis. Signal within the left transverse and sigmoid sinus appears to originate in the neck, possibly venous reflux. No dural fistula or AVM is seen. IMPRESSION: Brain MRI: 1. No acute finding.  Remote right occipital infarct. 2. Chronic active sinusitis. Intracranial MRA: Atherosclerosis without branch occlusion or flow limiting stenosis. Electronically Signed   By: Monte Fantasia M.D.   On: 02/07/2018 11:23    Medications:   . aspirin EC  325 mg Oral Daily  . rosuvastatin  40 mg Oral Daily  . sodium bicarbonate  650 mg Oral BID   Continuous Infusions: . sodium chloride Stopped (02/06/18 2320)     LOS: 0 days   Geradine Girt  [facility]   *Please refer to [email or web address], password [email or web address] to get updated schedule on who will round on this patient, as hospitalists switch teams weekly. If 7PM-7AM, please contact night-coverage at [email or web address], password [email or web address] for any overnight needs.  02/07/2018, 3:04 PM

## 2018-02-07 NOTE — Consult Note (Addendum)
Consultation Note Date: 02/07/2018   Patient Name: [name]  DOB: Oct 20, 1945  MRN: [id number]  Age / Sex: 73 y.o., male  PCP: Patient, No Pcp Per Referring Physician: Geradine Girt, DO  Reason for Consultation: Establishing goals of care  HPI/Patient Profile: [name] is an 73 y.o. male with medical history significant forpancreatic cancers/pSBRT, prostate cancers/pTURP and XRT, CAD status post CABG, and CKD stage IV, and recent admission for obstructive jaundice status post biliary stent placementwho is admitted to the ED with anemia, elevated troponin, and failure to thrive.   Clinical Assessment and Goals of Care: Patient is resting in bed, no family at bedside. He lives with one of his sons. 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  He states he watches t.v. during the day, and sits and looks out the door as there is nothing else to do.    We discussed his diagnosis, prognosis, and GOC. He states he is eating better and no longer has vomiting. 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 He is hopeful for further treatments. He states he could not imagine a time he would want to stop treatments if they are offered.    Will follow up tomorrow if still admitted to continue [place] conversations.      SUMMARY OF RECOMMENDATIONS   Continue care. Will continue to follow. Recommend palliative to follow at D/C.   Prognosis:   Poor overall.  Discharge Planning: To Be Determined      Primary Diagnoses: Present on Admission: . CVA (cerebral vascular accident) ([place]) . Pancreatic cancer ([place]) . CKD (chronic kidney disease), stage IV ([place]) . Coronary atherosclerosis of native coronary artery   I have reviewed the medical record, interviewed the patient and family, and examined the patient. The following aspects are  pertinent.  Past Medical History:  Diagnosis Date  . Acute MI, inferior wall ([place]) 2011  . Amputation finger    right first finger top portion age 81  . Aortic atherosclerosis ([place])   . ARF (acute renal failure) ([place]) 01/24/2015  . CHF (congestive heart failure) ([place])   . CKD (chronic kidney disease), stage III ([place]) 11/2015  . Coronary artery disease cardioloigst-  dr Stanford Breed   hx inferior MI 10-08-2009 emergency CABG x4  . DOE (dyspnea on exertion)    since starting chemo  . DVT (deep venous thrombosis) ([place]) 11/05/2016   right proximal-mid peroneal veins  . Fracture of left hip ([place])   . H/O hyperkalemia   . History of acute inferior wall myocardial infarction 10/08/2009  . History of blood transfusion   . History of chemotherapy   . History of echocardiogram 03/2012   EF 45%, LAE, Mild MR, no residual VSD  . History of first degree heart block   . History of metabolic acidosis   . History of thrombocytopenia   . History of urethral stricture   . History of UTI   . Hyperlipidemia   .  Hypertension   . Ischemic cardiomyopathy   . Kidney cysts    bilateral  . Multiple pulmonary nodules    Bilateral scattered  . Multiple thyroid nodules   . Obstructed, uropathy   . Obstructive uropathy   . Pancreatic carcinoma (HCC)    s/p SBRT  . Pneumonia    Childhood  . Prostate cancer ([place])   . S/P VSD repair    09/2009  . Systolic CHF, chronic ([place])   . VSD (ventricular septal defect) 09/2009   acute after MI   Social History   Socioeconomic History  . Marital status: Divorced    Spouse name: Not on file  . Number of children: Not on file  . Years of education: Not on file  . Highest education level: Not on file  Occupational History  . Not on file  Social Needs  . Financial resource strain: Not on file  . Food insecurity:    Worry: Not on file    Inability: Not on file  . Transportation needs:    Medical: Not on file    Non-medical: Not on file  Tobacco Use  .  Smoking status: Former Smoker    Packs/day: 1.50    Years: 45.00    Pack years: 67.50    Types: Cigarettes    Last attempt to quit: 01/13/2010    Years since quitting: 8.0  . Smokeless tobacco: Never Used  Substance and Sexual Activity  . Alcohol use: No    Comment: occasional   . Drug use: No  . Sexual activity: Not on file  Lifestyle  . Physical activity:    Days per week: Not on file    Minutes per session: Not on file  . Stress: Not on file  Relationships  . Social connections:    Talks on phone: Not on file    Gets together: Not on file    Attends religious service: Not on file    Active member of club or organization: Not on file    Attends meetings of clubs or organizations: Not on file    Relationship status: Not on file  Other Topics Concern  . Not on file  Social History Narrative  . Not on file   Family History  Problem Relation Age of Onset  . Cancer Paternal Aunt        unknown type cancer   . Cancer Paternal Uncle        unknown type cancer   Scheduled Meds: . aspirin EC  325 mg Oral Daily  . rosuvastatin  40 mg Oral Daily  . sodium bicarbonate  650 mg Oral BID   Continuous Infusions: . sodium chloride Stopped (02/06/18 2320)   PRN Meds:.acetaminophen **OR** acetaminophen, ondansetron **OR** ondansetron (ZOFRAN) IV Medications Prior to Admission:  Prior to Admission medications   Medication Sig Start Date End Date Taking? Authorizing Provider  amLODipine (NORVASC) 10 MG tablet Take 0.5 tablets (5 mg total) by mouth daily. 01/01/18  Yes Roxan Hockey, MD  aspirin EC 81 MG tablet Take 1 tablet (81 mg total) by mouth daily. 09/24/17  Yes Lelon Perla, MD  carvedilol (COREG) 12.5 MG tablet Take 1 tablet (12.5 mg total) by mouth 2 (two) times daily with a meal. 11/13/17  Yes Lelon Perla, MD  megestrol (MEGACE) 400 MG/10ML suspension Take 10 mLs (400 mg total) by mouth daily. 01/02/18  Yes Emokpae, Courage, MD  ondansetron (ZOFRAN) 4 MG tablet  Take 1 tablet (4 mg total)  by mouth every 6 (six) hours as needed for nausea or vomiting. 01/25/18  Yes Truitt Merle, MD  rosuvastatin (CRESTOR) 40 MG tablet Take 1 tablet (40 mg total) by mouth daily. 11/13/17  Yes Lelon Perla, MD  sodium bicarbonate 650 MG tablet Take 1 tablet (650 mg total) by mouth 2 (two) times daily. 01/01/18  Yes Emokpae, Courage, MD  tamsulosin (FLOMAX) 0.4 MG CAPS capsule Take 1 capsule (0.4 mg total) by mouth daily after supper. 01/01/18  Yes Emokpae, Courage, MD  oxyCODONE (ROXICODONE) 5 MG immediate release tablet Take 1 tablet (5 mg total) by mouth every 4 (four) hours as needed for severe pain. Patient not taking: Reported on 02/06/2018 01/01/18   Roxan Hockey, MD   Allergies  Allergen Reactions  . Chlorhexidine Gluconate Other (See Comments)    Red skin and flaked skin    Review of Systems  All other systems reviewed and are negative.   Physical Exam Pulmonary:     Effort: Pulmonary effort is normal.  Neurological:     Comments: Alert and oriented.      Vital Signs: BP 133/82   Pulse 78   Temp 98.4 F (36.9 C) (Oral)   Resp 18   Ht 5\' 7"  (1.702 m)   Wt 72.4 kg   SpO2 100%   BMI 25.00 kg/m  Pain Scale: 0-10   Pain Score: 0-No pain   SpO2: SpO2: 100 % O2 Device:SpO2: 100 % O2 Flow Rate: .   IO: Intake/output summary:   Intake/Output Summary (Last 24 hours) at 02/07/2018 1551 Last data filed at 02/07/2018 1200 Gross per 24 hour  Intake 2049 ml  Output 310 ml  Net 1739 ml    LBM: Last BM Date: 02/07/18 Baseline Weight: Weight: 72.4 kg Most recent weight: Weight: 72.4 kg     Palliative Assessment/Data:     Time In: 3:10 Time Out: 4:00 Time Total: 50 min Greater than 50%  of this time was spent counseling and coordinating care related to the above assessment and plan.  Signed by: Asencion Gowda, NP   Please contact Palliative Medicine Team phone at [phone] for questions and concerns.  For individual provider: See  Shea Evans

## 2018-02-07 NOTE — Progress Notes (Signed)
PT Cancellation Note  Patient Details Name: [name] MRN: [id number] DOB: Dec 06, 1945   Cancelled Treatment:    Reason Eval/Treat Not Completed: Patient at procedure or test/unavailable - will reattempt mobility eval this or next date.   K[facility] Hard [facility] 02/07/2018, 11:52 AM

## 2018-02-07 NOTE — Consult Note (Signed)
Requesting Physician: Dr. Rogene Houston    Chief Complaint: Multiple complaints including dizziness, gait imbalance, low appetite  History obtained from: Patient and Chart    HPI:                                                                                                                                       [name] is an 73 y.o. male with past medical history of pancreatic cancer, prostate cancer status post TURP and radiation, CHF, CKD, coronary artery disease, hypertension, ventral septal defect presents to the emergency room with fatigue, appetite, nausea and vomiting and 2 weeks of gait imbalance and dizziness.  Part of this work-up in the ED patient underwent a head CT which showed a age-indeterminate right PCA infarct.  Neurology was consulted for further evaluation.   Date last known well: 2 weeks ago tPA Given:, No, Outside window   Past Medical History:  Diagnosis Date  . Acute MI, inferior wall ([place]) 2011  . Amputation finger    right first finger top portion age 38  . Aortic atherosclerosis ([place])   . ARF (acute renal failure) ([place]) 01/24/2015  . CHF (congestive heart failure) ([place])   . CKD (chronic kidney disease), stage III ([place]) 11/2015  . Coronary artery disease cardioloigst-  dr Stanford Breed   hx inferior MI 10-08-2009 emergency CABG x4  . DOE (dyspnea on exertion)    since starting chemo  . DVT (deep venous thrombosis) ([place]) 11/05/2016   right proximal-mid peroneal veins  . Fracture of left hip ([place])   . H/O hyperkalemia   . History of acute inferior wall myocardial infarction 10/08/2009  . History of blood transfusion   . History of chemotherapy   . History of echocardiogram 03/2012   EF 45%, LAE, Mild MR, no residual VSD  . History of first degree heart block   . History of metabolic acidosis   . History of thrombocytopenia   . History of urethral stricture   . History of UTI   . Hyperlipidemia   . Hypertension   . Ischemic cardiomyopathy   . Kidney  cysts    bilateral  . Multiple pulmonary nodules    Bilateral scattered  . Multiple thyroid nodules   . Obstructed, uropathy   . Obstructive uropathy   . Pancreatic carcinoma (HCC)    s/p SBRT  . Pneumonia    Childhood  . Prostate cancer ([place])   . S/P VSD repair    09/2009  . Systolic CHF, chronic ([place])   . VSD (ventricular septal defect) 09/2009   acute after MI    Past Surgical History:  Procedure Laterality Date  . BILIARY STENT PLACEMENT N/A 12/29/2017   Procedure: BILIARY STENT PLACEMENT;  Surgeon: Clarene Essex, MD;  Location: [facility];  Service: Endoscopy;  Laterality: N/A;  . CARDIAC CATHETERIZATION    . CORONARY ARTERY BYPASS GRAFT     times  4  . CYSTOSCOPY W/ RETROGRADES N/A 04/03/2015   Procedure:  BLADDER BIOPSIES;  Surgeon: Festus Aloe, MD;  Location: [facility];  Service: Urology;  Laterality: N/A;  . CYSTOSCOPY W/ RETROGRADES Bilateral 06/27/2016   Procedure: CYSTOSCOPY WITH BILATERAL RETROGRADES;  Surgeon: Festus Aloe, MD;  Location: [facility];  Service: Urology;  Laterality: Bilateral;  . CYSTOSCOPY WITH BIOPSY N/A 06/27/2016   Procedure: CYSTOSCOPY WITH BLADDER BIOPSY URETHRAL BIOPSY;  Surgeon: Festus Aloe, MD;  Location: [facility];  Service: Urology;  Laterality: N/A;  . CYSTOSCOPY WITH URETHRAL DILATATION N/A 04/03/2015   Procedure: CYSTOSCOPY WITH BILATERAL RETROGRADES;  Surgeon: Festus Aloe, MD;  Location: [facility];  Service: Urology;  Laterality: N/A;  . ENDOSCOPIC RETROGRADE CHOLANGIOPANCREATOGRAPHY (ERCP) WITH PROPOFOL N/A 12/29/2017   Procedure: ENDOSCOPIC RETROGRADE CHOLANGIOPANCREATOGRAPHY (ERCP) WITH PROPOFOL;  Surgeon: Clarene Essex, MD;  Location: [facility];  Service: Endoscopy;  Laterality: N/A;  . EUS N/A 08/28/2016   Procedure: UPPER ENDOSCOPIC ULTRASOUND (EUS) RADIAL;  Surgeon: Milus Banister, MD;  Location: [facility];  Service: Endoscopy;  Laterality: N/A;  . flexible cystoscopy    . GOLD SEED IMPLANT N/A 07/03/2017   Procedure:  GOLD SEED IMPLANT;  Surgeon: Festus Aloe, MD;  Location[facility];  Service: Urology;  Laterality: N/A;  Needs Ultrasound Tech  . REMOVAL OF STONES  12/29/2017   Procedure: REMOVAL OF STONES;  Surgeon: Clarene Essex, MD;  [facility];  Service: Endoscopy;;  Balloon Sweep of Duct  . repair of post infarction posterior ventricular septal defect  09/2009  . SPACE OAR INSTILLATION N/A 07/03/2017   Procedure: SPACE OAR INSTILLATION;  Surgeon: Festus Aloe, MD;  Location: [facility];  Service: Urology;  Laterality: N/A;  . SPHINCTEROTOMY  12/29/2017   Procedure: SPHINCTEROTOMY;  Surgeon: Clarene Essex, MD;  Location[facility];  Service: Endoscopy;;  . TRANSURETHRAL RESECTION OF PROSTATE  06/27/2016   Procedure: TRANSURETHRAL RESECTION OF THE PROSTATE (TURP);  Surgeon: Festus Aloe, MD;  Location: [facility];  Service: Urology;;  . UPPER GI ENDOSCOPY      Family History  Problem Relation Age of Onset  . Cancer Paternal Aunt        unknown type cancer   . Cancer Paternal Uncle        unknown type cancer   Social History:  reports that he quit smoking about 8 years ago. His smoking use included cigarettes. He has a 67.50 pack-year smoking history. He has never used smokeless tobacco. He reports that he does not drink alcohol or use drugs.  Allergies:  Allergies  Allergen Reactions  . Chlorhexidine Gluconate Other (See Comments)    Red skin and flaked skin     Medications:                                                                                                                        I reviewed home medications   ROS:  14 systems reviewed and negative except above    Examination:                                                                                                      General: Appears  well-developed  Psych: Affect appropriate to situation Eyes: No scleral injection HENT: No OP obstrucion Head: Normocephalic.  Cardiovascular: Normal rate and regular rhythm.  Respiratory: Effort normal and breath sounds normal to anterior ascultation GI: Soft.  No distension. There is no tenderness.  Skin: WDI    Neurological Examination Mental Status: Alert, oriented, thought content appropriate.  Speech fluent without evidence of aphasia. Able to follow 3 step commands without difficulty. Cranial Nerves: II: Visual fields grossly normal,  III,IV, VI: ptosis not present, extra-ocular motions intact bilaterally, pupils equal, round, reactive to light and accommodation V,VII: smile symmetric, facial light touch sensation normal bilaterally VIII: hearing normal bilaterally IX,X: uvula rises symmetrically XI: bilateral shoulder shrug XII: midline tongue extension Motor: Right : Upper extremity   5/5    Left:     Upper extremity   5/5  Lower extremity   5/5     Lower extremity   5/5 Tone and bulk:normal tone throughout; no atrophy noted Sensory: Pinprick and light touch intact throughout, bilaterally Deep Tendon Reflexes: 2+ and symmetric throughout Plantars: Right: downgoing   Left: downgoing Cerebellar: normal finger-to-nose, normal rapid alternating movements and normal heel-to-shin test Gait: normal gait and station     Lab Results: Basic Metabolic Panel: Recent Labs  Lab 02/06/18 1942  NA 132*  K 3.8  CL 104  CO2 15*  GLUCOSE 118*  BUN 47*  CREATININE 3.52*  CALCIUM 9.3    CBC: Recent Labs  Lab 02/06/18 1942  WBC 8.0  HGB 7.6*  HCT 25.1*  MCV 81.8  PLT 454*    Coagulation Studies: No results for input(s): LABPROT, INR in the last 72 hours.  Imaging: Ct Abdomen Pelvis Wo Contrast  Result Date: 02/06/2018 CLINICAL DATA:  Pancreatic neoplasm follow-up. Abdominal pain. EXAM: CT ABDOMEN AND PELVIS WITHOUT CONTRAST TECHNIQUE: Multidetector CT imaging of  the abdomen and pelvis was performed following the standard protocol without IV contrast. COMPARISON:  None. FINDINGS: Lower chest: The included heart size is enlarged but stable in appearance without pericardial effusion or thickening. Chronic left ventricular calcified aneurysm is identified the inferior wall. Lung bases are free of pulmonary consolidations. No effusion or pneumothorax is identified. Hepatobiliary: Pneumobilia is identified compatible with recent intervention with common bowel duct stent in place. Small amount of perihepatic ascites is noted, new since prior. Gallbladder is unremarkable and free of stones. No space-occupying mass of the liver is identified given limitations of a noncontrast study. Pancreas: Peripancreatic edema is identified, new since prior raising concern for changes of acute pancreatitis. No pseudocyst formation or ductal dilatation of the pancreatic gland is identified. Surgical clips are again noted in the neck of the pancreas. Spleen: New perisplenic ascites. No splenomegaly. Adrenals/Urinary Tract: Normal bilateral adrenal glands. Subtle bilateral renal hypodensities compatible with renal cysts some which are too small to  characterize. In exophytic indeterminate hypodensities noted off the lateral aspect of the left kidney, stable in appearance measuring up to 8 mm. Chronic mild ectasia of the renal collecting systems with diffuse thick-walled appearance of the urinary bladder, raising cystitis as a possibility for this appearance. Stomach/Bowel: Stomach is within normal limits. Appendix appears normal. There is mild anorectal soft tissue thickening likely due to underdistention. Internal hemorrhoids are a possibility as well. Vascular/Lymphatic: Aortoiliac and branch vessel atherosclerosis. Soft tissue densities in the porta hepatis can not exclude porta hepatic adenopathy. No significant progression is identified, the largest approximately 13 mm. No enlarged  retroperitoneal or pelvic lymph nodes. Reproductive: Metallic clips noted in the prostate with mild prostatic enlargement. This is stable. Other: Mild mesenteric edema is noted Musculoskeletal: Stable multilevel biconcave likely osteoporotic mild compressions of along the lumbar spine. No aggressive osseous lesions. Stable lipomas of the external oblique muscles bilaterally. IMPRESSION: 1. Interval development of peripancreatic edema with new small volume of perihepatic and perisplenic ascites. Findings are in keeping with acute pancreatitis. No pseudocyst formation or ductal dilatation is identified. 2. New CBD stent in place with resultant pneumobilia from recent intervention. 3. Bilateral renal hypodensities some which appear to represent simple cysts and others are indeterminate possibly proteinaceous or complex. 4. Mild anorectal soft tissue thickening, felt in part due to underdistention. Internal hemorrhoids are a possibility as well. Proctocolitis is believed less likely. Electronically Signed   By: Ashley Royalty M.D.   On: 02/06/2018 21:13   Dg Chest 2 View  Result Date: 02/06/2018 CLINICAL DATA:  Prostate and pancreatic cancer. Patient reports abdominal pain. Weakness and dizziness. EXAM: CHEST - 2 VIEW COMPARISON:  Chest CT 11/25/2016. Lung bases from abdominal CT performed concurrently. FINDINGS: Post median sternotomy and CABG. Low lung volumes with prominent heart size, accentuated by hypoaeration. No pulmonary edema, focal airspace disease, pleural effusion or pneumothorax. No acute osseous abnormalities. IMPRESSION: Low lung volumes without acute abnormality. Electronically Signed   By: Keith Rake M.D.   On: 02/06/2018 21:06   Ct Head Wo Contrast  Addendum Date: 02/06/2018   ADDENDUM REPORT: 02/06/2018 21:31 ADDENDUM: These results were called by telephone at the time of interpretation on 02/06/2018 at 9:21 pm to Dr. Fredia Sorrow , who verbally acknowledged these results. Electronically  Signed   By: Fidela Salisbury M.D.   On: 02/06/2018 21:31   Result Date: 02/06/2018 CLINICAL DATA:  Ataxia.  Clinical suspicion for stroke. EXAM: CT HEAD WITHOUT CONTRAST TECHNIQUE: Contiguous axial images were obtained from the base of the skull through the vertex without intravenous contrast. COMPARISON:  None. FINDINGS: Brain: Area of hypoattenuation in the right occipital lobe, likely represents age-indeterminate ischemic infarction. No evidence of intracranial hemorrhage, mass effect. Normal ventricular size. Vascular: Calcific atherosclerotic disease at the skull base. Skull: Normal. Negative for fracture or focal lesion. Sinuses/Orbits: Near complete opacification of the maxillary sinuses with chronic periosteal reaction and remodeling of the sinus walls. Near complete opacification of the left ethmoid sinuses with expansile appearance. Mucosal thickening of the right ethmoid sinus and bilateral sphenoid sinuses. Milder mucosal thickening of the frontal sinuses. Other: None. IMPRESSION: Age-indeterminate ischemic infarction in the right occipital lobe. Pansinusitis with chronic appearance. Fungal sinusitis is not excluded. Electronically Signed: By: Fidela Salisbury M.D. On: 02/06/2018 21:01     ASSESSMENT AND PLAN  73 year old male with multiple vascular risk factors and cancer presenting with dizziness, gait imbalance as well as multiple other complaints including reduced appetite and fatigue.    Work-up  before identifies patient has hyponatremia, mildly elevated troponins, elevated creatinine, anemia to explain symptoms.  MRI brain has been ordered and is pending.  She denies any visual complaints and examination no obvious visual field deficits noted.  I suspect findings on CT is likely chronic occipital stroke and patient has recovered fairly well.  Obtaining MRI brain would help assess that this is acute or chronic.   Subacute/Chronic right PCA stroke, favor chronic stroke    Recommend # MRI of the brain without contrast #MRA Head carotid Dopplers # Echo if stroke is subacute  #Continue aspirin #Crestor 40 mg daily # BP goal: Normotension this subacute /chronic stroke # HBAIC and Lipid profile # Telemetry monitoring # Frequent neuro checks # NPO until passes stroke swallow screen  Please page stroke NP  Or  PA  Or MD from 8am -4 pm  as this patient from this time will be  followed by the stroke.   You can look them up on [email or web address]  Password [facility]   Denine Brotz [facility] Pager Number [phone]

## 2018-02-08 ENCOUNTER — Observation Stay (HOSPITAL_BASED_OUTPATIENT_CLINIC_OR_DEPARTMENT_OTHER): Payer: Medicare Other

## 2018-02-08 DIAGNOSIS — N184 Chronic kidney disease, stage 4 (severe): Secondary | ICD-10-CM

## 2018-02-08 DIAGNOSIS — R7989 Other specified abnormal findings of blood chemistry: Secondary | ICD-10-CM | POA: Diagnosis not present

## 2018-02-08 DIAGNOSIS — E440 Moderate protein-calorie malnutrition: Secondary | ICD-10-CM

## 2018-02-08 DIAGNOSIS — C250 Malignant neoplasm of head of pancreas: Secondary | ICD-10-CM | POA: Diagnosis not present

## 2018-02-08 DIAGNOSIS — I6389 Other cerebral infarction: Secondary | ICD-10-CM | POA: Diagnosis not present

## 2018-02-08 DIAGNOSIS — I2510 Atherosclerotic heart disease of native coronary artery without angina pectoris: Secondary | ICD-10-CM | POA: Diagnosis not present

## 2018-02-08 LAB — TYPE AND SCREEN
ABO/RH(D): O POS
Antibody Screen: NEGATIVE
Unit division: 0
Unit division: 0

## 2018-02-08 LAB — BPAM RBC
Blood Product Expiration Date: 202002262359
Blood Product Expiration Date: 202002262359
ISSUE DATE / TIME: 202001260137
ISSUE DATE / TIME: 202001260500
UNIT TYPE AND RH: 5100
Unit Type and Rh: 5100

## 2018-02-08 LAB — CBC
HEMATOCRIT: 26.5 % — AB (ref 39.0–52.0)
HEMOGLOBIN: 8.8 g/dL — AB (ref 13.0–17.0)
MCH: 26.7 pg (ref 26.0–34.0)
MCHC: 33.2 g/dL (ref 30.0–36.0)
MCV: 80.5 fL (ref 80.0–100.0)
Platelets: 341 10*3/uL (ref 150–400)
RBC: 3.29 MIL/uL — ABNORMAL LOW (ref 4.22–5.81)
RDW: 17.3 % — ABNORMAL HIGH (ref 11.5–15.5)
WBC: 8 10*3/uL (ref 4.0–10.5)
nRBC: 0 % (ref 0.0–0.2)

## 2018-02-08 LAB — LIPID PANEL
CHOLESTEROL: 72 mg/dL (ref 0–200)
HDL: 23 mg/dL — ABNORMAL LOW (ref >40)
LDL Cholesterol: 35 mg/dL (ref 0–99)
Total CHOL/HDL Ratio: 3.1 RATIO
Triglycerides: 72 mg/dL (ref <150)
VLDL: 14 mg/dL (ref 0–40)

## 2018-02-08 LAB — BASIC METABOLIC PANEL
Anion gap: 8 (ref 5–15)
BUN: 42 mg/dL — ABNORMAL HIGH (ref 8–23)
CHLORIDE: 107 mmol/L (ref 98–111)
CO2: 16 mmol/L — ABNORMAL LOW (ref 22–32)
Calcium: 9.1 mg/dL (ref 8.9–10.3)
Creatinine, Ser: 3.53 mg/dL — ABNORMAL HIGH (ref 0.61–1.24)
GFR calc Af Amer: 19 mL/min — ABNORMAL LOW (ref >60)
GFR, EST NON AFRICAN AMERICAN: 16 mL/min — AB (ref >60)
Glucose, Bld: 106 mg/dL — ABNORMAL HIGH (ref 70–99)
Potassium: 4.3 mmol/L (ref 3.5–5.1)
Sodium: 131 mmol/L — ABNORMAL LOW (ref 135–145)

## 2018-02-08 LAB — HEMOGLOBIN A1C
Hgb A1c MFr Bld: 6.5 % — ABNORMAL HIGH (ref 4.8–5.6)
Mean Plasma Glucose: 139.85 mg/dL

## 2018-02-08 MED ORDER — ROSUVASTATIN CALCIUM 20 MG PO TABS
20.0000 mg | ORAL_TABLET | Freq: Every day | ORAL | Status: DC
  Administered 2018-02-08: 20 mg via ORAL

## 2018-02-08 MED ORDER — ENSURE ENLIVE PO LIQD
237.0000 mL | Freq: Two times a day (BID) | ORAL | Status: DC
  Administered 2018-02-08: 237 mL via ORAL

## 2018-02-08 NOTE — Evaluation (Signed)
Occupational Therapy Evaluation Patient Details Name: [name] MRN: [id number] DOB: 20-Feb-1945 Today's Date: 02/08/2018    History of Present Illness 73 y.o. male with past medical history of pancreatic cancer, prostate cancer status post TURP and radiation, CHF, CKD, coronary artery disease, hypertension, ventral septal defect presents to the emergency room with fatigue, appetite, nausea and vomiting and 2 weeks of gait imbalance and dizziness.   Clinical Impression   PTA, pt was living with his son and performed BADLs and used [facility] for functional mobility. Pt performing ADLs and functional mobility at [facility] A level near baseline. Pt presenting with decreased activity tolerance as seen by SOB and fatigue. Pt would benefit from further acute OT to facilitate safe dc. Recommend dc to home once medically stable per physician.     Follow Up Recommendations  No OT follow up;Supervision/Assistance - 24 hour    Equipment Recommendations  None recommended by OT    Recommendations for Other Services PT consult     Precautions / Restrictions Precautions Precautions: Fall      Mobility Bed Mobility Overal bed mobility: Modified Independent             General bed mobility comments: Increased time as needed  Transfers Overall transfer level: Modified independent               General transfer comment: Increased time and use of SPC    Balance Overall balance assessment: Needs assistance Sitting-balance support: No upper extremity supported;Feet supported Sitting balance-Leahy Scale: Good     Standing balance support: Single extremity supported;During functional activity Standing balance-Leahy Scale: Poor Standing balance comment: Reliant on single UE support                           ADL either performed or assessed with clinical judgement   ADL Overall ADL's : Needs assistance/impaired                                        General ADL Comments: Pt performing ADLs and functional mobility at [employer] A level. presents with decreased activity tolerance and noted SOB at sink. Pt HR elevating to 120s with simple hand hygiene task at sink.     Vision Baseline Vision/History: Wears glasses Wears Glasses: At all times Patient Visual Report: No change from baseline       Perception     Praxis      Pertinent Vitals/Pain Pain Assessment: No/denies pain     Hand Dominance Right   Extremity/Trunk Assessment Upper Extremity Assessment Upper Extremity Assessment: Generalized weakness   Lower Extremity Assessment Lower Extremity Assessment: Defer to PT evaluation       Communication Communication Communication: No difficulties   Cognition Arousal/Alertness: Awake/alert Behavior During Therapy: WFL for tasks assessed/performed Overall Cognitive Status: Within Functional Limits for tasks assessed                                 General Comments: Pt seems at functional baseline. However, not forth coming with information and presents as if disinterested in participating in therapy. Pt requiring increased cues to perform discussed plan to wash hands at sink and then sit in chair. Unsure if this is due to decreased memory or pt's lack of interest.    General Comments  HR elevating to 120 with activity    Exercises     Shoulder Instructions      Home Living Family/patient expects to be discharged to:: Private residence Living Arrangements: Children Available Help at Discharge: Family;Available PRN/intermittently Type of Home: Apartment Home Access: Stairs to enter;Other (comment)("I don't have stairs to deal with")     Home Layout: One level         Bathroom Toilet: Standard     Home Equipment: Cane - single point          Prior Functioning/Environment Level of Independence: Independent with assistive device(s)        Comments: Performs ADLs. Uses SPC  for functional mobility.        OT Problem List: Decreased activity tolerance;Impaired balance (sitting and/or standing);Decreased knowledge of precautions      OT Treatment/Interventions: Self-care/ADL training;Therapeutic exercise;DME and/or AE instruction;Energy conservation;Therapeutic activities;Patient/family education    OT Goals(Current goals can be found in the care plan section) Acute Rehab OT Goals Patient Stated Goal: "I better go home today" OT Goal Formulation: With patient Time For Goal Achievement: 02/22/18 Potential to Achieve Goals: Good  OT Frequency: Min 2X/week   Barriers to D/C:            Co-evaluation              AM-PAC OT "6 Clicks" Daily Activity     Outcome Measure Help from another person eating meals?: None Help from another person taking care of personal grooming?: None Help from another person toileting, which includes using toliet, bedpan, or urinal?: A Little Help from another person bathing (including washing, rinsing, drying)?: A Little Help from another person to put on and taking off regular upper body clothing?: None Help from another person to put on and taking off regular lower body clothing?: A Little 6 Click Score: 21   End of Session Equipment Utilized During Treatment: Other (comment)(SPC) Nurse Communication: Mobility status  Activity Tolerance: Patient tolerated treatment well;Patient limited by fatigue Patient left: in chair;with call bell/phone within reach;with chair alarm set  OT Visit Diagnosis: Unsteadiness on feet (R26.81);Other abnormalities of gait and mobility (R26.89);Muscle weakness (generalized) (M62.81)                Time: 4540-9811 OT Time Calculation (min): 17 min Charges:  OT General Charges $OT Visit: 1 Visit OT Evaluation $OT Eval Low Complexity: Edwards, OTR/L Acute Rehab Pager: [phone] Office: [place] 02/08/2018, 8:15 AM

## 2018-02-08 NOTE — Plan of Care (Signed)
Pt d/c home. Pt is stable with no new concerns, d/c instructions done pt verbalize understanding, pt will be transported out of hospital by family. Pt completed and met care plan goals for these visit.

## 2018-02-08 NOTE — Discharge Summary (Addendum)
Physician Discharge Summary  BRADDOCK SERVELLON YHC:[id number] DOB: Aug 15, 1945 DOA: 02/06/2018  PCP: Patient, No Pcp Per-- patient said he was getting a new PCP on Wednesday but did not know who it was for me to update (? Dr. Burr Medico of Oncology managing home health?)  Admit date: 02/06/2018 Discharge date: 02/08/2018  Admitted From: home Discharge disposition: home   Recommendations for Outpatient Follow-Up:   1. Home with hospice 2. Have held his BP medications as BP controlled off the meds   Discharge Diagnosis:   Active Problems:   Coronary atherosclerosis of native coronary artery   Anemia   Pancreatic cancer (HCC)   CKD (chronic kidney disease), stage IV (HCC)   Elevated troponin    Discharge Condition: Improved.  Diet recommendation:  Regular.  Wound care: None.  Code status: Full.   History of Present Illness:  [name] is a 73 y.o. male with medical history significant for pancreatic cancer s/p SBRT, prostate cancer s/p TURP and XRT, CAD status post CABG, CKD stage IV, VSD status post repair, hypertension, hyperlipidemia, and recent admission for obstructive jaundice status post biliary stent placement who presents the ED with progressive fatigue, low appetite with poor oral intake, nausea with occasional vomiting, abdominal pain, and dizziness/balance difficulty over the last 2 weeks.  He denies any chest pain, palpitations, peripheral edema.   Hospital Course by Problem:   Pancreatic cancers/pSBRT and biliary stent placement for recent obstructive jaundice: LFTs have normalized compared to prior. Prior oncology note reviewed and she does not a candidate for further surgical intervention or chemotherapy. Patient was seen by palliative care on last admission. -per discharge note from Dr. Denton Brick: patient declines further in the intervention at this time, patient wants to go home with home aide services to spend whatever time he has left with family  Per  note from Dr. Burr Medico On 12/19: Due to his worsening performance status, multiple comorbidities, I do not think he is a candidate for pancreatic surgeryorchemotherapy. Pt did notpursue pancreatic surgery last year after chemo and radiation. -I discussed hospice with patient, which is probablythe better home care service for him. I will consult palliative care service to help his transition, he is open to the discussion, although he did not tell me if he wants to not. He wants to talk to his sons also.    MRI shows old CVA -no further work up  CT scan showing some acute pancreatitis? -lipase normal and exam not consistent with this  Elevated troponin - likely demand ischemia -not a candidate for invasive intervention due to the above issue  Acute on chronic anemia-- ? From CKD -s/p 2 units PRBC  CKD stage IV: Currently stable, continue monitor.  Hypertension: -BP normal off medications  Hyperlipidemia: Continue rosuvastatin-- ? Need for this going forward in light of limited time  Failure to thrive: Patient with poor appetite and oral intake secondary to multiple comorbidities including pancreatic and prostate cancer  - care manager was able to speak with patient's son who would like to have home hospice, this will be arranged  Moderate malnutrition   Medical Consultants:   Palliative care neuro   Discharge Exam:   Vitals:   02/08/18 0940 02/08/18 1300  BP: 122/85 126/83  Pulse: 76 78  Resp:  18  Temp: 98.6 F (37 C) 98.7 F (37.1 C)  SpO2: 100% 100%   Vitals:   02/08/18 0200 02/08/18 0400 02/08/18 0940 02/08/18 1300  BP:  119/78  122/85 126/83  Pulse:  90 76 78  Resp: 18 19  18   Temp:  98.5 F (36.9 C) 98.6 F (37 C) 98.7 F (37.1 C)  TempSrc:  Oral Oral Oral  SpO2:  99% 100% 100%  Weight:      Height:        General exam: Appears calm and comfortable. Evasive when answering questions   The results of significant diagnostics from this  hospitalization (including imaging, microbiology, ancillary and laboratory) are listed below for reference.     Procedures and Diagnostic Studies:   Ct Abdomen Pelvis Wo Contrast  Result Date: 02/06/2018 CLINICAL DATA:  Pancreatic neoplasm follow-up. Abdominal pain. EXAM: CT ABDOMEN AND PELVIS WITHOUT CONTRAST TECHNIQUE: Multidetector CT imaging of the abdomen and pelvis was performed following the standard protocol without IV contrast. COMPARISON:  None. FINDINGS: Lower chest: The included heart size is enlarged but stable in appearance without pericardial effusion or thickening. Chronic left ventricular calcified aneurysm is identified the inferior wall. Lung bases are free of pulmonary consolidations. No effusion or pneumothorax is identified. Hepatobiliary: Pneumobilia is identified compatible with recent intervention with common bowel duct stent in place. Small amount of perihepatic ascites is noted, new since prior. Gallbladder is unremarkable and free of stones. No space-occupying mass of the liver is identified given limitations of a noncontrast study. Pancreas: Peripancreatic edema is identified, new since prior raising concern for changes of acute pancreatitis. No pseudocyst formation or ductal dilatation of the pancreatic gland is identified. Surgical clips are again noted in the neck of the pancreas. Spleen: New perisplenic ascites. No splenomegaly. Adrenals/Urinary Tract: Normal bilateral adrenal glands. Subtle bilateral renal hypodensities compatible with renal cysts some which are too small to characterize. In exophytic indeterminate hypodensities noted off the lateral aspect of the left kidney, stable in appearance measuring up to 8 mm. Chronic mild ectasia of the renal collecting systems with diffuse thick-walled appearance of the urinary bladder, raising cystitis as a possibility for this appearance. Stomach/Bowel: Stomach is within normal limits. Appendix appears normal. There is mild  anorectal soft tissue thickening likely due to underdistention. Internal hemorrhoids are a possibility as well. Vascular/Lymphatic: Aortoiliac and branch vessel atherosclerosis. Soft tissue densities in the porta hepatis can not exclude porta hepatic adenopathy. No significant progression is identified, the largest approximately 13 mm. No enlarged retroperitoneal or pelvic lymph nodes. Reproductive: Metallic clips noted in the prostate with mild prostatic enlargement. This is stable. Other: Mild mesenteric edema is noted Musculoskeletal: Stable multilevel biconcave likely osteoporotic mild compressions of along the lumbar spine. No aggressive osseous lesions. Stable lipomas of the external oblique muscles bilaterally. IMPRESSION: 1. Interval development of peripancreatic edema with new small volume of perihepatic and perisplenic ascites. Findings are in keeping with acute pancreatitis. No pseudocyst formation or ductal dilatation is identified. 2. New CBD stent in place with resultant pneumobilia from recent intervention. 3. Bilateral renal hypodensities some which appear to represent simple cysts and others are indeterminate possibly proteinaceous or complex. 4. Mild anorectal soft tissue thickening, felt in part due to underdistention. Internal hemorrhoids are a possibility as well. Proctocolitis is believed less likely. Electronically Signed   By: Ashley Royalty M.D.   On: 02/06/2018 21:13   Dg Chest 2 View  Result Date: 02/06/2018 CLINICAL DATA:  Prostate and pancreatic cancer. Patient reports abdominal pain. Weakness and dizziness. EXAM: CHEST - 2 VIEW COMPARISON:  Chest CT 11/25/2016. Lung bases from abdominal CT performed concurrently. FINDINGS: Post median sternotomy and CABG. Low lung volumes with  prominent heart size, accentuated by hypoaeration. No pulmonary edema, focal airspace disease, pleural effusion or pneumothorax. No acute osseous abnormalities. IMPRESSION: Low lung volumes without acute  abnormality. Electronically Signed   By: Keith Rake M.D.   On: 02/06/2018 21:06   Ct Head Wo Contrast  Addendum Date: 02/06/2018   ADDENDUM REPORT: 02/06/2018 21:31 ADDENDUM: These results were called by telephone at the time of interpretation on 02/06/2018 at 9:21 pm to Dr. Fredia Sorrow , who verbally acknowledged these results. Electronically Signed   By: Fidela Salisbury M.D.   On: 02/06/2018 21:31   Result Date: 02/06/2018 CLINICAL DATA:  Ataxia.  Clinical suspicion for stroke. EXAM: CT HEAD WITHOUT CONTRAST TECHNIQUE: Contiguous axial images were obtained from the base of the skull through the vertex without intravenous contrast. COMPARISON:  None. FINDINGS: Brain: Area of hypoattenuation in the right occipital lobe, likely represents age-indeterminate ischemic infarction. No evidence of intracranial hemorrhage, mass effect. Normal ventricular size. Vascular: Calcific atherosclerotic disease at the skull base. Skull: Normal. Negative for fracture or focal lesion. Sinuses/Orbits: Near complete opacification of the maxillary sinuses with chronic periosteal reaction and remodeling of the sinus walls. Near complete opacification of the left ethmoid sinuses with expansile appearance. Mucosal thickening of the right ethmoid sinus and bilateral sphenoid sinuses. Milder mucosal thickening of the frontal sinuses. Other: None. IMPRESSION: Age-indeterminate ischemic infarction in the right occipital lobe. Pansinusitis with chronic appearance. Fungal sinusitis is not excluded. Electronically Signed: By: Fidela Salisbury M.D. On: 02/06/2018 21:01   Mr [name] Head Wo Contrast  Result Date: 02/07/2018 CLINICAL DATA:  Focal neuro deficit with stroke suspected EXAM: MRI HEAD WITHOUT CONTRAST MRA HEAD WITHOUT CONTRAST TECHNIQUE: Multiplanar, multiecho pulse sequences of the brain and surrounding structures were obtained without intravenous contrast. Angiographic images of the head were obtained using MRA  technique without contrast. COMPARISON:  Head CT from yesterday FINDINGS: MRI HEAD FINDINGS Coronal T2 imaging was not acquired. Brain: No acute infarction, hemorrhage, hydrocephalus, extra-axial collection or mass lesion. Moderate remote right occipital cortically based infarct. Generalized atrophy. Mild chronic small vessel ischemia in the cerebral white matter. Small remote left precentral gyrus cortex infarct along hand function. Vascular: Arterial findings below. Skull and upper cervical spine: Negative for marrow lesion Sinuses/Orbits: Chronic sinusitis with mucosal thickening and sclerotic wall thickening by CT. Mild mucosal thickening along the mastoids bilaterally. Negative nasopharynx. MRA HEAD FINDINGS There is artifact from slab interface and dephasing. Lobulation of the bilateral carotid siphons likely from atherosclerosis. No intradural aneurysm. Dominant right vertebral artery. No branch occlusion or proximal flow limiting stenosis. Signal within the left transverse and sigmoid sinus appears to originate in the neck, possibly venous reflux. No dural fistula or AVM is seen. IMPRESSION: Brain MRI: 1. No acute finding.  Remote right occipital infarct. 2. Chronic active sinusitis. Intracranial MRA: Atherosclerosis without branch occlusion or flow limiting stenosis. Electronically Signed   By: Monte Fantasia M.D.   On: 02/07/2018 11:23   Mr Brain Wo Contrast  Result Date: 02/07/2018 CLINICAL DATA:  Focal neuro deficit with stroke suspected EXAM: MRI HEAD WITHOUT CONTRAST MRA HEAD WITHOUT CONTRAST TECHNIQUE: Multiplanar, multiecho pulse sequences of the brain and surrounding structures were obtained without intravenous contrast. Angiographic images of the head were obtained using MRA technique without contrast. COMPARISON:  Head CT from yesterday FINDINGS: MRI HEAD FINDINGS Coronal T2 imaging was not acquired. Brain: No acute infarction, hemorrhage, hydrocephalus, extra-axial collection or mass lesion.  Moderate remote right occipital cortically based infarct. Generalized atrophy. Mild chronic small  vessel ischemia in the cerebral white matter. Small remote left precentral gyrus cortex infarct along hand function. Vascular: Arterial findings below. Skull and upper cervical spine: Negative for marrow lesion Sinuses/Orbits: Chronic sinusitis with mucosal thickening and sclerotic wall thickening by CT. Mild mucosal thickening along the mastoids bilaterally. Negative nasopharynx. MRA HEAD FINDINGS There is artifact from slab interface and dephasing. Lobulation of the bilateral carotid siphons likely from atherosclerosis. No intradural aneurysm. Dominant right vertebral artery. No branch occlusion or proximal flow limiting stenosis. Signal within the left transverse and sigmoid sinus appears to originate in the neck, possibly venous reflux. No dural fistula or AVM is seen. IMPRESSION: Brain MRI: 1. No acute finding.  Remote right occipital infarct. 2. Chronic active sinusitis. Intracranial MRA: Atherosclerosis without branch occlusion or flow limiting stenosis. Electronically Signed   By: Monte Fantasia M.D.   On: 02/07/2018 11:23     Labs:   Basic Metabolic Panel: Recent Labs  Lab 02/06/18 1942 02/07/18 1103 02/08/18 0500  NA 132* 132* 131*  K 3.8 4.2 4.3  CL 104 105 107  CO2 15* 18* 16*  GLUCOSE 118* 134* 106*  BUN 47* 44* 42*  CREATININE 3.52* 3.62* 3.53*  CALCIUM 9.3 9.4 9.1   GFR Estimated Creatinine Clearance: 17.2 mL/min (A) (by C-G formula based on SCr of 3.53 mg/dL (H)). Liver Function Tests: Recent Labs  Lab 02/06/18 1942 02/07/18 1103  AST 17 18  ALT 12 12  ALKPHOS 60 57  BILITOT 0.6 0.7  PROT 7.8 7.5  ALBUMIN 2.3* 2.3*   Recent Labs  Lab 02/06/18 1942  LIPASE 21   No results for input(s): AMMONIA in the last 168 hours. Coagulation profile No results for input(s): INR, PROTIME in the last 168 hours.  CBC: Recent Labs  Lab 02/06/18 1942 02/07/18 1103  02/08/18 0500  WBC 8.0 9.6 8.0  HGB 7.6* 9.4* 8.8*  HCT 25.1* 29.4* 26.5*  MCV 81.8 81.4 80.5  PLT 454* 366 341   Cardiac Enzymes: Recent Labs  Lab 02/06/18 1950 02/07/18 0057 02/07/18 1103 02/07/18 1601  TROPONINI 0.08* 0.09* 0.24* 0.19*   BNP: Invalid input(s): POCBNP CBG: No results for input(s): GLUCAP in the last 168 hours. D-Dimer No results for input(s): DDIMER in the last 72 hours. Hgb A1c Recent Labs    02/08/18 0500  HGBA1C 6.5*   Lipid Profile Recent Labs    02/08/18 0500  CHOL 72  HDL 23*  LDLCALC 35  TRIG 72  CHOLHDL 3.1   Thyroid function studies No results for input(s): TSH, T4TOTAL, T3FREE, THYROIDAB in the last 72 hours.  Invalid input(s): FREET3 Anemia work up No results for input(s): VITAMINB12, FOLATE, FERRITIN, TIBC, IRON, RETICCTPCT in the last 72 hours. Microbiology No results found for this or any previous visit (from the past 240 hour(s)).   Discharge Instructions:   Discharge Instructions    Diet general   Complete by:  As directed    Discharge instructions   Complete by:  As directed    Home health hospice referral   Increase activity slowly   Complete by:  As directed      Allergies as of 02/08/2018      Reactions   Chlorhexidine Gluconate Other (See Comments)   Red skin and flaked skin       Medication List    STOP taking these medications   amLODipine 10 MG tablet Commonly known as:  NORVASC   carvedilol 12.5 MG tablet Commonly known as:  COREG  oxyCODONE 5 MG immediate release tablet Commonly known as:  ROXICODONE   rosuvastatin 40 MG tablet Commonly known as:  CRESTOR     TAKE these medications   aspirin EC 81 MG tablet Take 1 tablet (81 mg total) by mouth daily.   megestrol 400 MG/10ML suspension Commonly known as:  MEGACE Take 10 mLs (400 mg total) by mouth daily.   ondansetron 4 MG tablet Commonly known as:  ZOFRAN Take 1 tablet (4 mg total) by mouth every 6 (six) hours as needed for nausea  or vomiting.   sodium bicarbonate 650 MG tablet Take 1 tablet (650 mg total) by mouth 2 (two) times daily.   tamsulosin 0.4 MG Caps capsule Commonly known as:  FLOMAX Take 1 capsule (0.4 mg total) by mouth daily after supper.         Time coordinating discharge: 25 min  Signed:  Geradine Girt DO  [facility] 02/08/2018, 2:48 PM

## 2018-02-08 NOTE — Progress Notes (Signed)
VASCULAR LAB PRELIMINARY  PRELIMINARY  PRELIMINARY  PRELIMINARY  Carotid duplex completed.    Preliminary report:  See CV Proc  [name], RVT 02/08/2018, 9:11 AM

## 2018-02-08 NOTE — Progress Notes (Addendum)
STROKE TEAM PROGRESS NOTE   SUBJECTIVE (INTERVAL HISTORY) Sitting up on edge of bed. Glad he did not have a stroke. Agreeable no need to be followed by neuro. Planned transition to Hospice per Dr. Eliseo Squires. Stroke will sign off.   OBJECTIVE Vitals:   02/07/18 2344 02/08/18 0100 02/08/18 0200 02/08/18 0400  BP: 113/76   119/78  Pulse: 82   90  Resp: 16 17 18 19   Temp: 98.6 F (37 C)   98.5 F (36.9 C)  TempSrc: Oral   Oral  SpO2: 100%   99%  Weight:      Height:        CBC:  Recent Labs  Lab 02/07/18 1103 02/08/18 0500  WBC 9.6 8.0  HGB 9.4* 8.8*  HCT 29.4* 26.5*  MCV 81.4 80.5  PLT 366 161    Basic Metabolic Panel:  Recent Labs  Lab 02/07/18 1103 02/08/18 0500  NA 132* 131*  K 4.2 4.3  CL 105 107  CO2 18* 16*  GLUCOSE 134* 106*  BUN 44* 42*  CREATININE 3.62* 3.53*  CALCIUM 9.4 9.1    Lipid Panel:     Component Value Date/Time   CHOL 72 02/08/2018 0500   TRIG 72 02/08/2018 0500   HDL 23 (L) 02/08/2018 0500   CHOLHDL 3.1 02/08/2018 0500   VLDL 14 02/08/2018 0500   LDLCALC 35 02/08/2018 0500   HgbA1c:  Lab Results  Component Value Date   HGBA1C 6.5 (H) 02/08/2018   Urine Drug Screen:     Component Value Date/Time   LABOPIA NONE DETECTED 10/08/2009 0125   COCAINSCRNUR NONE DETECTED 10/08/2009 0125   LABBENZ NONE DETECTED 10/08/2009 0125   AMPHETMU NONE DETECTED 10/08/2009 0125   THCU NONE DETECTED 10/08/2009 0125   LABBARB  10/08/2009 0125    NONE DETECTED        DRUG SCREEN FOR MEDICAL PURPOSES ONLY.  IF CONFIRMATION IS NEEDED FOR ANY PURPOSE, NOTIFY LAB WITHIN 5 DAYS.        LOWEST DETECTABLE LIMITS FOR URINE DRUG SCREEN Drug Class       Cutoff (ng/mL) Amphetamine      1000 Barbiturate      200 Benzodiazepine   096 Tricyclics       045 Opiates          300 Cocaine          300 THC              50    Alcohol Level No results found for: [facility]  IMAGING Ct Abdomen Pelvis Wo Contrast 02/06/2018 IMPRESSION:  1. Interval development of  peripancreatic edema with new small volume of perihepatic and perisplenic ascites. Findings are in keeping with acute pancreatitis. No pseudocyst formation or ductal dilatation is identified.  2. New CBD stent in place with resultant pneumobilia from recent intervention.  3. Bilateral renal hypodensities some which appear to represent simple cysts and others are indeterminate possibly proteinaceous or complex.  4. Mild anorectal soft tissue thickening, felt in part due to underdistention. Internal hemorrhoids are a possibility as well. Proctocolitis is believed less likely.   Dg Chest 2 View 02/06/2018 IMPRESSION:  Low lung volumes without acute abnormality.   Ct Head Wo Contrast 02/06/2018   IMPRESSION:  Age-indeterminate ischemic infarction in the right occipital lobe.  Pansinusitis with chronic appearance. Fungal sinusitis is not excluded.   Brain MRI:  02/07/2018 1. No acute finding. Remote right occipital infarct.  2. Chronic active sinusitis.   Brain  MRA: 02/07/2018 Atherosclerosis without branch occlusion or flow limiting stenosis.   Transthoracic Echocardiogram  02/07/2018 Study Conclusions - Left ventricle: The cavity size was normal. There was focal basal hypertrophy. Systolic function was normal. The estimated ejection fraction was in the range of 55% to 60%. Doppler parameters are consistent with abnormal left ventricular relaxation (grade 1 diastolic dysfunction). The distal inferoseptal/apical inferior walls are severely scarred/thinned and is aneurysmal. No evidenence of VSD at this area. - Regional wall motion abnormality: Akinesis of the apical inferior and apical septal myocardium. - Aortic valve: Mildly calcified annulus. Trileaflet; mildly thickened leaflets. Valve area (VTI): 2.16 cm^2. Valve area (Vmax): 2.18 cm^2. Valve area (Vmean): 1.83 cm^2. - Mitral valve: Mildly calcified annulus. Normal thickness leaflets - Right ventricle: Systolic function was mildly  reduced. - Atrial septum: No defect or patent foramen ovale was identified.  Bilateral Carotid Dopplers  There is 1-39% bilateral ICA stenosis. Vertebral artery flow is antegrade.     PHYSICAL EXAM - no change in exam today General - Well nourished, well developed, in no apparent distress.  Ophthalmologic - fundi not visualized due to noncooperation.  Cardiovascular - Regular rate and rhythm.  Mental Status -  Level of arousal and orientation to month, place, and person were intact, but not to year. Language including expression, naming, repetition, comprehension was assessed and found intact. Fund of Knowledge was assessed and was intact.  Cranial Nerves II - XII - II - Visual field intact OU. III, IV, VI - Extraocular movements intact. V - Facial sensation intact bilaterally. VII - Facial movement intact bilaterally. VIII - Hearing & vestibular intact bilaterally. X - Palate elevates symmetrically. XI - Chin turning & shoulder shrug intact bilaterally. XII - Tongue protrusion intact.  Motor Strength - The patient's strength was normal in all extremities and pronator drift was absent.  Bulk was normal and fasciculations were absent.   Motor Tone - Muscle tone was assessed at the neck and appendages and was normal.  Reflexes - The patient's reflexes were symmetrical in all extremities and he had no pathological reflexes.  Sensory - Light touch, temperature/pinprick were assessed and were symmetrical.    Coordination - The patient had normal movements in the hands and feet with no ataxia or dysmetria.  Tremor was absent.  Gait and Station - deferred.    ASSESSMENT/PLAN [name] is a 73 y.o. male with history of CHF, CAD with MI in 2011 causing VSD s/p VSD repair, prostate cancer status post TURP and radiation, pancreatic cancer status post SBRT, HTN, HLD, DVT history, CKD presenting with fatigue, decreased appetite, N/V, dizziness  and 2 weeks of gait imbalance. He  did not receive IV t-PA due to late presentation.  Failure to thrive  Likely multifactorial, likely due to CKD with elevated creatinine, hyponatremia sodium 132, anemia with hemoglobin 7.6 status post PRBC transfusion  Clinically improved overnight  Treatment per primary team  Chronic infarct - Remote right occipital infarct - source unknown, could be related to previous extensive cardiac events in 2011 including MI and causing VSD status post VSD repair as well as stent post CABG  Resultant at baseline  CT head - Age-indeterminate ischemic infarction in the right occipital lobe.   MRI head - No acute finding. Remote right occipital infarct.   MRA head - Atherosclerosis without branch occlusion or flow limiting stenosis.   Carotid Doppler - B ICA 1-39% stenosis, VAs antegrade   2D Echo  - EF 55 - 60%. No  cardiac source of emboli identified.   LDL - 35  HgbA1c - 6.5   UDS - not performed  VTE prophylaxis - SCDs  Diet - regular  aspirin 81 mg daily prior to admission, now on aspirin 81 mg daily.  Continue aspirin on discharge  Therapy recommendations:  HH PT, no OT  Disposition:  Return home  Hypertension  Stable . Long-term BP goal normotensive  Hyperlipidemia  Lipid lowering medication PTA:  Crestor 40 mg daily  LDL 35, goal < 70  Decrease current lipid lowering medication to Crestor 20 mg daily given low LDL  Continue statin at discharge  Other Stroke Risk Factors  Advanced age  Former cigarette smoker - quit 8 years ago  CAD/MI status post CABG and a VSD repair  CHF  History of DVT  Other Active Problems  Pansinusitis with chronic appearance  Anemia due to CKD- Hb 7.6-> PRBC ->9.4->8.8  CKD stage IV- 3.52->3.62->3.53  Hyponatremia Na 132->131  Elevated troponins - 0.08->0.09  Pancreatic cancer status post SBRT  Prostate cancer status post TURP and radiation  Plans for transition to Hospice at d/c.  NOTHING FURTHER TO ADD FROM THE  STROKE STANDPOINT  Stroke Service will sign off. Please call should any needs arise.   Hospital day # 0  Burnetta Sabin, MSN, APRN, ANVP-BC, AGPCNP-BC Advanced Practice Stroke Nurse [place] for Schedule & Pager information 02/08/2018 2:08 PM   ATTENDING NOTE: I reviewed above note and agree with the assessment and plan. Pt was seen and examined.   No acute event overnight.  Patient appetite and mental status much improved.  Carotid Doppler unremarkable.  LDL 35 and A1c 6.5.  Continue aspirin on discharge.  Decrease Crestor from 40 to 20 mg given low LDL at this time.  Patient stroke on CT likely chronic stroke from previous extensive cardiac surgery.  No further work-up needed at this time.  Neurology will sign off. Please call with questions. Thanks for the consult.   Rosalin Hawking, MD PhD Stroke Neurology 02/08/2018 6:45 PM   To contact Stroke Continuity provider, please refer to [email or web address] After hours, contact General Neurology

## 2018-02-08 NOTE — Care Management Obs Status (Signed)
[place] NOTIFICATION   Patient Details  Name: [name] MRN: [id number] Date of Birth: [birth year]   Medicare Observation Status Notification Given:  Yes    Erenest Rasher, RN 02/08/2018, 10:46 AM

## 2018-02-08 NOTE — Care Management Note (Signed)
Case Management Note  Patient Details  Name: [name] MRN: [id number] Date of Birth: 1945-01-22  Subjective/Objective:     Pt in with r/o CVA. He is from home with his son: Juliane Lack. CM spoke to Leane Platt, [place] on the phone and he states he is the one to make the decisions. Pt lives with Juliane Lack but when Paradise works one of the other sons stays at the home with the patient.  DME: walker, cane No issues obtaining meds.  Pt reliant on sons for transportation.     Pt was active with [facility] prior to admission.          Action/Plan: Plan has been to transition patient from [facility] services to Hospice services through [facility]. CM went over with Saunders Glance about changing to hospice at d/c and he is in agreement. CM updated MD and Tim with [facility] hospice. Needed information faxed to [phone].  Saunders Glance aware of [facility] calling to set up home hospice and of d/c home today. Benj[place], [place] states one of pts sons will provide transport home.   Expected Discharge Date:                  Expected Discharge Plan:  Home w Hospice Care  In-House Referral:     Discharge planning Services  CM Consult  Post Acute Care Choice:    Choice offered to:     DME Arranged:    DME Agency:     HH Arranged:    HH Agency:  [facility]  Status of Service:  In process, will continue to follow  If discussed at Long Length of Stay Meetings, dates discussed:    Additional Comments:  Pollie Friar, RN 02/08/2018, 1:46 PM

## 2018-02-08 NOTE — Evaluation (Signed)
Physical Therapy Evaluation Patient Details Name: [name] MRN: [id number] DOB: 21-Jul-1945 Today's Date: 02/08/2018   History of Present Illness  73 y.o. male with past medical history of pancreatic cancer, prostate cancer status post TURP and radiation, CHF, CKD, coronary artery disease, hypertension, ventral septal defect presents to the emergency room with fatigue, appetite, nausea and vomiting and 2 weeks of gait imbalance and dizziness.    Clinical Impression  Patient admitted with the above listed diagnosis. Patient reports Mod I with mobility with SPC prior to admission. Patient today requiring Min guard/Sup for OOB mobility for general safety, however patient requesting PT not guard him. Patient with mild unsteadiness throughout with noted muscle weakness. Will recommend HHPT at discharge. PT to follow acutely.     Follow Up Recommendations Home health PT;Supervision - Intermittent    Equipment Recommendations  None recommended by PT    Recommendations for Other Services       Precautions / Restrictions Precautions Precautions: Fall Restrictions Weight Bearing Restrictions: No      Mobility  Bed Mobility Overal bed mobility: Modified Independent             General bed mobility comments: Increased time as needed  Transfers Overall transfer level: Modified independent               General transfer comment: Increased time and use of SPC;  Ambulation/Gait Ambulation/Gait assistance: Min guard;Supervision Gait Distance (Feet): 120 Feet Assistive device: Straight cane Gait Pattern/deviations: Step-through pattern;Decreased stride length;Trunk flexed Gait velocity: decreased   General Gait Details: slow pace of gait;  patient stating "let go of me before you make me fall" - patient use of handrails in hallway  Stairs            Wheelchair Mobility    Modified Rankin (Stroke Patients Only)       Balance Overall balance assessment: Needs  assistance Sitting-balance support: No upper extremity supported;Feet supported Sitting balance-Leahy Scale: Good     Standing balance support: Single extremity supported;During functional activity Standing balance-Leahy Scale: Poor Standing balance comment: Reliant on single UE support                             Pertinent Vitals/Pain Pain Assessment: No/denies pain    Home Living Family/patient expects to be discharged to:: Private residence Living Arrangements: Children Available Help at Discharge: Family;Available PRN/intermittently Type of Home: Apartment Home Access: Stairs to enter;Other (comment)("I don't have stairs to deal with")     Home Layout: One level Home Equipment: Cane - single point      Prior Function Level of Independence: Independent with assistive device(s)         Comments: Performs ADLs. Uses SPC for functional mobility.     Hand Dominance   Dominant Hand: Right    Extremity/Trunk Assessment   Upper Extremity Assessment Upper Extremity Assessment: Defer to OT evaluation    Lower Extremity Assessment Lower Extremity Assessment: Generalized weakness       Communication   Communication: No difficulties  Cognition Arousal/Alertness: Awake/alert Behavior During Therapy: WFL for tasks assessed/performed Overall Cognitive Status: Within Functional Limits for tasks assessed                                 General Comments: Pt seems at functional baseline. However, not forth coming with information and presents as if disinterested in  participating in therapy. Pt requiring increased cues to perform discussed plan to wash hands at sink and then sit in chair. Unsure if this is due to decreased memory or pt's lack of interest.       General Comments General comments (skin integrity, edema, etc.): HR elevating to 120 with activity    Exercises     Assessment/Plan    PT Assessment Patient needs continued PT  services  PT Problem List Decreased strength;Decreased activity tolerance;Decreased balance;Decreased mobility;Decreased safety awareness       PT Treatment Interventions DME instruction;Gait training;Functional mobility training;Therapeutic activities;Therapeutic exercise;Balance training;Patient/family education    PT Goals (Current goals can be found in the Care Plan section)  Acute Rehab PT Goals Patient Stated Goal: "I better go home today" PT Goal Formulation: With patient Time For Goal Achievement: 02/22/18 Potential to Achieve Goals: Good    Frequency Min 4X/week   Barriers to discharge        Co-evaluation               AM-PAC PT "6 Clicks" Mobility  Outcome Measure Help needed turning from your back to your side while in a flat bed without using bedrails?: A Little Help needed moving from lying on your back to sitting on the side of a flat bed without using bedrails?: A Little Help needed moving to and from a bed to a chair (including a wheelchair)?: A Little Help needed standing up from a chair using your arms (e.g., wheelchair or bedside chair)?: A Little Help needed to walk in hospital room?: A Little Help needed climbing 3-5 steps with a railing? : A Little 6 Click Score: 18    End of Session Equipment Utilized During Treatment: Gait belt Activity Tolerance: Patient tolerated treatment well Patient left: in bed;with call bell/phone within reach Nurse Communication: Mobility status PT Visit Diagnosis: Unsteadiness on feet (R26.81);Other abnormalities of gait and mobility (R26.89);Muscle weakness (generalized) (M62.81)    Time: 1100-1109 PT Time Calculation (min) (ACUTE ONLY): 9 min   Charges:   PT Evaluation $PT Eval Moderate Complexity: 1 Mod      Lanney Gins, PT, DPT Supplemental Physical Therapist 02/08/18 12:00 PM Pager: [phone] Office: [phone]

## 2018-02-08 NOTE — Progress Notes (Signed)
Initial Nutrition Assessment  DOCUMENTATION CODES:   Non-severe (moderate) malnutrition in context of chronic illness  INTERVENTION:  Ensure Enlive po BID, each supplement provides 350 kcal and 20 grams of protein  NUTRITION DIAGNOSIS:   Moderate Malnutrition related to chronic illness as evidenced by mild fat depletion, mild muscle depletion, percent weight loss.  GOAL:   Patient will meet greater than or equal to 90% of their needs  MONITOR:   PO intake, Supplement acceptance, Weight trends  REASON FOR ASSESSMENT:   Consult Malnutrition Eval  ASSESSMENT:   Pt with PMH of pancreatic cancer, prostate cancer s/p TURP & radiation, CAD s/p CABG, CKD stage IV, HTN, and HLD. Presented to ED with fatigue, n/v and dizziness.   Pt was asleep, took a second to wake up and respond to questions. Pt reported that he eats 2-3 meals a day, but was reluctant to reveal what his daily intake is. He did mention scrambled eggs, sausage and water for breakfast. When asked about lunch and dinner he just said that "he gets by".  Pt was asked about his mobility; he reported that he is capable of completing ADL. Per pt chart by occupational therapy pt has decreased activity tolerance as seen by SOB and fatigue. Overall for ADL per OT note pt requires assistance.   Nursing student reported that pt is only completing ~50% of his meals. When asking pt about UBW he was unable to report a UBW but said he has lost weight. Per pt chart 12.9% wt loss from 01/01/18 which is significant for the time frame. NFPE revealed mild depletion of fat and muscle.   Medications reviewed. Labs reviewed    NUTRITION - FOCUSED PHYSICAL EXAM:    Most Recent Value  Orbital Region  Mild depletion  Upper Arm Region  No depletion  Thoracic and Lumbar Region  No depletion  Buccal Region  Mild depletion  Temple Region  Mild depletion  Clavicle Bone Region  Mild depletion  Clavicle and Acromion Bone Region  Mild depletion   Scapular Bone Region  Mild depletion  Dorsal Hand  Mild depletion  Patellar Region  No depletion  Anterior Thigh Region  No depletion  Posterior Calf Region  No depletion  Edema (RD Assessment)  None  Hair  Reviewed  Eyes  Reviewed  Mouth  Reviewed  Skin  Reviewed  Nails  Reviewed      Diet Order:   Diet Order            Diet general        Diet regular Room service appropriate? Yes; Fluid consistency: Thin  Diet effective now             EDUCATION NEEDS:   No education needs have been identified at this time  Skin:  Skin Assessment: Reviewed RN Assessment  Last BM:  1/27  Height:   Ht Readings from Last 1 Encounters:  02/07/18 5\' 7"  (1.702 m)    Weight:   Wt Readings from Last 1 Encounters:  02/07/18 72.4 kg    Ideal Body Weight:  67.27 kg  BMI:  Body mass index is 25 kg/m.  Estimated Nutritional Needs:   Kcal:  1800-2000 kcal  Protein:  90-100 g  Fluid:  >/= 1.8L   [employer] Dietetic Intern

## 2018-02-09 DIAGNOSIS — I252 Old myocardial infarction: Secondary | ICD-10-CM | POA: Diagnosis not present

## 2018-02-09 DIAGNOSIS — I129 Hypertensive chronic kidney disease with stage 1 through stage 4 chronic kidney disease, or unspecified chronic kidney disease: Secondary | ICD-10-CM | POA: Diagnosis not present

## 2018-02-09 DIAGNOSIS — C259 Malignant neoplasm of pancreas, unspecified: Secondary | ICD-10-CM | POA: Diagnosis not present

## 2018-02-09 DIAGNOSIS — Z515 Encounter for palliative care: Secondary | ICD-10-CM | POA: Diagnosis not present

## 2018-02-09 DIAGNOSIS — D508 Other iron deficiency anemias: Secondary | ICD-10-CM | POA: Diagnosis not present

## 2018-02-09 DIAGNOSIS — N184 Chronic kidney disease, stage 4 (severe): Secondary | ICD-10-CM | POA: Diagnosis not present

## 2018-02-09 DIAGNOSIS — K831 Obstruction of bile duct: Secondary | ICD-10-CM | POA: Diagnosis not present

## 2018-02-09 DIAGNOSIS — C61 Malignant neoplasm of prostate: Secondary | ICD-10-CM | POA: Diagnosis not present

## 2018-02-11 DIAGNOSIS — N184 Chronic kidney disease, stage 4 (severe): Secondary | ICD-10-CM | POA: Diagnosis not present

## 2018-02-11 DIAGNOSIS — C259 Malignant neoplasm of pancreas, unspecified: Secondary | ICD-10-CM | POA: Diagnosis not present

## 2018-02-11 DIAGNOSIS — D508 Other iron deficiency anemias: Secondary | ICD-10-CM | POA: Diagnosis not present

## 2018-02-11 DIAGNOSIS — C61 Malignant neoplasm of prostate: Secondary | ICD-10-CM | POA: Diagnosis not present

## 2018-02-11 DIAGNOSIS — I252 Old myocardial infarction: Secondary | ICD-10-CM | POA: Diagnosis not present

## 2018-02-11 DIAGNOSIS — I129 Hypertensive chronic kidney disease with stage 1 through stage 4 chronic kidney disease, or unspecified chronic kidney disease: Secondary | ICD-10-CM | POA: Diagnosis not present

## 2018-02-13 DIAGNOSIS — C259 Malignant neoplasm of pancreas, unspecified: Secondary | ICD-10-CM | POA: Diagnosis not present

## 2018-02-13 DIAGNOSIS — Z515 Encounter for palliative care: Secondary | ICD-10-CM | POA: Diagnosis not present

## 2018-02-13 DIAGNOSIS — N184 Chronic kidney disease, stage 4 (severe): Secondary | ICD-10-CM | POA: Diagnosis not present

## 2018-02-13 DIAGNOSIS — C61 Malignant neoplasm of prostate: Secondary | ICD-10-CM | POA: Diagnosis not present

## 2018-02-13 DIAGNOSIS — K831 Obstruction of bile duct: Secondary | ICD-10-CM | POA: Diagnosis not present

## 2018-02-13 DIAGNOSIS — I252 Old myocardial infarction: Secondary | ICD-10-CM | POA: Diagnosis not present

## 2018-02-13 DIAGNOSIS — I129 Hypertensive chronic kidney disease with stage 1 through stage 4 chronic kidney disease, or unspecified chronic kidney disease: Secondary | ICD-10-CM | POA: Diagnosis not present

## 2018-02-13 DIAGNOSIS — D508 Other iron deficiency anemias: Secondary | ICD-10-CM | POA: Diagnosis not present

## 2018-02-14 DIAGNOSIS — C61 Malignant neoplasm of prostate: Secondary | ICD-10-CM | POA: Diagnosis not present

## 2018-02-14 DIAGNOSIS — D508 Other iron deficiency anemias: Secondary | ICD-10-CM | POA: Diagnosis not present

## 2018-02-14 DIAGNOSIS — C259 Malignant neoplasm of pancreas, unspecified: Secondary | ICD-10-CM | POA: Diagnosis not present

## 2018-02-14 DIAGNOSIS — N184 Chronic kidney disease, stage 4 (severe): Secondary | ICD-10-CM | POA: Diagnosis not present

## 2018-02-14 DIAGNOSIS — I129 Hypertensive chronic kidney disease with stage 1 through stage 4 chronic kidney disease, or unspecified chronic kidney disease: Secondary | ICD-10-CM | POA: Diagnosis not present

## 2018-02-14 DIAGNOSIS — I252 Old myocardial infarction: Secondary | ICD-10-CM | POA: Diagnosis not present

## 2018-02-17 DIAGNOSIS — I129 Hypertensive chronic kidney disease with stage 1 through stage 4 chronic kidney disease, or unspecified chronic kidney disease: Secondary | ICD-10-CM | POA: Diagnosis not present

## 2018-02-17 DIAGNOSIS — I252 Old myocardial infarction: Secondary | ICD-10-CM | POA: Diagnosis not present

## 2018-02-17 DIAGNOSIS — D508 Other iron deficiency anemias: Secondary | ICD-10-CM | POA: Diagnosis not present

## 2018-02-17 DIAGNOSIS — C61 Malignant neoplasm of prostate: Secondary | ICD-10-CM | POA: Diagnosis not present

## 2018-02-17 DIAGNOSIS — N184 Chronic kidney disease, stage 4 (severe): Secondary | ICD-10-CM | POA: Diagnosis not present

## 2018-02-17 DIAGNOSIS — C259 Malignant neoplasm of pancreas, unspecified: Secondary | ICD-10-CM | POA: Diagnosis not present

## 2018-02-23 DIAGNOSIS — C61 Malignant neoplasm of prostate: Secondary | ICD-10-CM | POA: Diagnosis not present

## 2018-02-23 DIAGNOSIS — D508 Other iron deficiency anemias: Secondary | ICD-10-CM | POA: Diagnosis not present

## 2018-02-23 DIAGNOSIS — C259 Malignant neoplasm of pancreas, unspecified: Secondary | ICD-10-CM | POA: Diagnosis not present

## 2018-02-23 DIAGNOSIS — N184 Chronic kidney disease, stage 4 (severe): Secondary | ICD-10-CM | POA: Diagnosis not present

## 2018-02-23 DIAGNOSIS — I252 Old myocardial infarction: Secondary | ICD-10-CM | POA: Diagnosis not present

## 2018-02-23 DIAGNOSIS — I129 Hypertensive chronic kidney disease with stage 1 through stage 4 chronic kidney disease, or unspecified chronic kidney disease: Secondary | ICD-10-CM | POA: Diagnosis not present

## 2018-03-03 DIAGNOSIS — D508 Other iron deficiency anemias: Secondary | ICD-10-CM | POA: Diagnosis not present

## 2018-03-03 DIAGNOSIS — C259 Malignant neoplasm of pancreas, unspecified: Secondary | ICD-10-CM | POA: Diagnosis not present

## 2018-03-03 DIAGNOSIS — I129 Hypertensive chronic kidney disease with stage 1 through stage 4 chronic kidney disease, or unspecified chronic kidney disease: Secondary | ICD-10-CM | POA: Diagnosis not present

## 2018-03-03 DIAGNOSIS — N184 Chronic kidney disease, stage 4 (severe): Secondary | ICD-10-CM | POA: Diagnosis not present

## 2018-03-03 DIAGNOSIS — C61 Malignant neoplasm of prostate: Secondary | ICD-10-CM | POA: Diagnosis not present

## 2018-03-03 DIAGNOSIS — I252 Old myocardial infarction: Secondary | ICD-10-CM | POA: Diagnosis not present

## 2018-03-09 DIAGNOSIS — D508 Other iron deficiency anemias: Secondary | ICD-10-CM | POA: Diagnosis not present

## 2018-03-09 DIAGNOSIS — C61 Malignant neoplasm of prostate: Secondary | ICD-10-CM | POA: Diagnosis not present

## 2018-03-09 DIAGNOSIS — I252 Old myocardial infarction: Secondary | ICD-10-CM | POA: Diagnosis not present

## 2018-03-09 DIAGNOSIS — C259 Malignant neoplasm of pancreas, unspecified: Secondary | ICD-10-CM | POA: Diagnosis not present

## 2018-03-09 DIAGNOSIS — I129 Hypertensive chronic kidney disease with stage 1 through stage 4 chronic kidney disease, or unspecified chronic kidney disease: Secondary | ICD-10-CM | POA: Diagnosis not present

## 2018-03-09 DIAGNOSIS — N184 Chronic kidney disease, stage 4 (severe): Secondary | ICD-10-CM | POA: Diagnosis not present

## 2018-03-14 DIAGNOSIS — Z515 Encounter for palliative care: Secondary | ICD-10-CM | POA: Diagnosis not present

## 2018-03-14 DIAGNOSIS — C259 Malignant neoplasm of pancreas, unspecified: Secondary | ICD-10-CM | POA: Diagnosis not present

## 2018-03-14 DIAGNOSIS — I129 Hypertensive chronic kidney disease with stage 1 through stage 4 chronic kidney disease, or unspecified chronic kidney disease: Secondary | ICD-10-CM | POA: Diagnosis not present

## 2018-03-14 DIAGNOSIS — N184 Chronic kidney disease, stage 4 (severe): Secondary | ICD-10-CM | POA: Diagnosis not present

## 2018-03-14 DIAGNOSIS — K831 Obstruction of bile duct: Secondary | ICD-10-CM | POA: Diagnosis not present

## 2018-03-14 DIAGNOSIS — C61 Malignant neoplasm of prostate: Secondary | ICD-10-CM | POA: Diagnosis not present

## 2018-03-14 DIAGNOSIS — I252 Old myocardial infarction: Secondary | ICD-10-CM | POA: Diagnosis not present

## 2018-03-14 DIAGNOSIS — D508 Other iron deficiency anemias: Secondary | ICD-10-CM | POA: Diagnosis not present

## 2018-03-17 DIAGNOSIS — I252 Old myocardial infarction: Secondary | ICD-10-CM | POA: Diagnosis not present

## 2018-03-17 DIAGNOSIS — I129 Hypertensive chronic kidney disease with stage 1 through stage 4 chronic kidney disease, or unspecified chronic kidney disease: Secondary | ICD-10-CM | POA: Diagnosis not present

## 2018-03-17 DIAGNOSIS — D508 Other iron deficiency anemias: Secondary | ICD-10-CM | POA: Diagnosis not present

## 2018-03-17 DIAGNOSIS — C61 Malignant neoplasm of prostate: Secondary | ICD-10-CM | POA: Diagnosis not present

## 2018-03-17 DIAGNOSIS — C259 Malignant neoplasm of pancreas, unspecified: Secondary | ICD-10-CM | POA: Diagnosis not present

## 2018-03-17 DIAGNOSIS — N184 Chronic kidney disease, stage 4 (severe): Secondary | ICD-10-CM | POA: Diagnosis not present

## 2018-03-18 DIAGNOSIS — C259 Malignant neoplasm of pancreas, unspecified: Secondary | ICD-10-CM | POA: Diagnosis not present

## 2018-03-18 DIAGNOSIS — N184 Chronic kidney disease, stage 4 (severe): Secondary | ICD-10-CM | POA: Diagnosis not present

## 2018-03-18 DIAGNOSIS — C61 Malignant neoplasm of prostate: Secondary | ICD-10-CM | POA: Diagnosis not present

## 2018-03-18 DIAGNOSIS — I129 Hypertensive chronic kidney disease with stage 1 through stage 4 chronic kidney disease, or unspecified chronic kidney disease: Secondary | ICD-10-CM | POA: Diagnosis not present

## 2018-03-18 DIAGNOSIS — D508 Other iron deficiency anemias: Secondary | ICD-10-CM | POA: Diagnosis not present

## 2018-03-18 DIAGNOSIS — I252 Old myocardial infarction: Secondary | ICD-10-CM | POA: Diagnosis not present

## 2018-03-22 DIAGNOSIS — I252 Old myocardial infarction: Secondary | ICD-10-CM | POA: Diagnosis not present

## 2018-03-22 DIAGNOSIS — I129 Hypertensive chronic kidney disease with stage 1 through stage 4 chronic kidney disease, or unspecified chronic kidney disease: Secondary | ICD-10-CM | POA: Diagnosis not present

## 2018-03-22 DIAGNOSIS — D508 Other iron deficiency anemias: Secondary | ICD-10-CM | POA: Diagnosis not present

## 2018-03-22 DIAGNOSIS — C61 Malignant neoplasm of prostate: Secondary | ICD-10-CM | POA: Diagnosis not present

## 2018-03-22 DIAGNOSIS — C259 Malignant neoplasm of pancreas, unspecified: Secondary | ICD-10-CM | POA: Diagnosis not present

## 2018-03-22 DIAGNOSIS — N184 Chronic kidney disease, stage 4 (severe): Secondary | ICD-10-CM | POA: Diagnosis not present

## 2018-03-23 ENCOUNTER — Emergency Department (HOSPITAL_COMMUNITY)
Admission: EM | Admit: 2018-03-23 | Discharge: 2018-04-14 | Disposition: E | Attending: Emergency Medicine | Admitting: Emergency Medicine

## 2018-03-23 ENCOUNTER — Encounter: Payer: Self-pay | Admitting: Nurse Practitioner

## 2018-03-23 ENCOUNTER — Emergency Department (HOSPITAL_COMMUNITY)

## 2018-03-23 DIAGNOSIS — R7989 Other specified abnormal findings of blood chemistry: Secondary | ICD-10-CM

## 2018-03-23 DIAGNOSIS — E872 Acidosis, unspecified: Secondary | ICD-10-CM

## 2018-03-23 DIAGNOSIS — R0902 Hypoxemia: Secondary | ICD-10-CM | POA: Diagnosis not present

## 2018-03-23 DIAGNOSIS — Z7982 Long term (current) use of aspirin: Secondary | ICD-10-CM | POA: Diagnosis not present

## 2018-03-23 DIAGNOSIS — R4182 Altered mental status, unspecified: Secondary | ICD-10-CM | POA: Diagnosis not present

## 2018-03-23 DIAGNOSIS — Z87891 Personal history of nicotine dependence: Secondary | ICD-10-CM | POA: Diagnosis not present

## 2018-03-23 DIAGNOSIS — I129 Hypertensive chronic kidney disease with stage 1 through stage 4 chronic kidney disease, or unspecified chronic kidney disease: Secondary | ICD-10-CM | POA: Diagnosis not present

## 2018-03-23 DIAGNOSIS — E7220 Disorder of urea cycle metabolism, unspecified: Secondary | ICD-10-CM | POA: Diagnosis not present

## 2018-03-23 DIAGNOSIS — C259 Malignant neoplasm of pancreas, unspecified: Secondary | ICD-10-CM | POA: Diagnosis not present

## 2018-03-23 DIAGNOSIS — C61 Malignant neoplasm of prostate: Secondary | ICD-10-CM | POA: Diagnosis not present

## 2018-03-23 DIAGNOSIS — Z79899 Other long term (current) drug therapy: Secondary | ICD-10-CM | POA: Diagnosis not present

## 2018-03-23 DIAGNOSIS — E162 Hypoglycemia, unspecified: Secondary | ICD-10-CM | POA: Diagnosis not present

## 2018-03-23 DIAGNOSIS — D649 Anemia, unspecified: Secondary | ICD-10-CM | POA: Insufficient documentation

## 2018-03-23 DIAGNOSIS — I5022 Chronic systolic (congestive) heart failure: Secondary | ICD-10-CM | POA: Diagnosis not present

## 2018-03-23 DIAGNOSIS — Z951 Presence of aortocoronary bypass graft: Secondary | ICD-10-CM | POA: Insufficient documentation

## 2018-03-23 DIAGNOSIS — R778 Other specified abnormalities of plasma proteins: Secondary | ICD-10-CM

## 2018-03-23 DIAGNOSIS — R0689 Other abnormalities of breathing: Secondary | ICD-10-CM | POA: Diagnosis not present

## 2018-03-23 DIAGNOSIS — N184 Chronic kidney disease, stage 4 (severe): Secondary | ICD-10-CM | POA: Diagnosis not present

## 2018-03-23 DIAGNOSIS — G9340 Encephalopathy, unspecified: Secondary | ICD-10-CM

## 2018-03-23 DIAGNOSIS — E1122 Type 2 diabetes mellitus with diabetic chronic kidney disease: Secondary | ICD-10-CM | POA: Insufficient documentation

## 2018-03-23 DIAGNOSIS — I130 Hypertensive heart and chronic kidney disease with heart failure and stage 1 through stage 4 chronic kidney disease, or unspecified chronic kidney disease: Secondary | ICD-10-CM | POA: Diagnosis not present

## 2018-03-23 DIAGNOSIS — I252 Old myocardial infarction: Secondary | ICD-10-CM | POA: Diagnosis not present

## 2018-03-23 DIAGNOSIS — R0603 Acute respiratory distress: Secondary | ICD-10-CM | POA: Diagnosis present

## 2018-03-23 DIAGNOSIS — D508 Other iron deficiency anemias: Secondary | ICD-10-CM | POA: Diagnosis not present

## 2018-03-23 DIAGNOSIS — R945 Abnormal results of liver function studies: Secondary | ICD-10-CM | POA: Diagnosis not present

## 2018-03-23 DIAGNOSIS — R404 Transient alteration of awareness: Secondary | ICD-10-CM | POA: Diagnosis not present

## 2018-03-23 DIAGNOSIS — E161 Other hypoglycemia: Secondary | ICD-10-CM | POA: Diagnosis not present

## 2018-03-23 LAB — COMPREHENSIVE METABOLIC PANEL
ALT: 2997 U/L — ABNORMAL HIGH (ref 0–44)
AST: 9426 U/L — ABNORMAL HIGH (ref 15–41)
Albumin: 2.2 g/dL — ABNORMAL LOW (ref 3.5–5.0)
Alkaline Phosphatase: 433 U/L — ABNORMAL HIGH (ref 38–126)
BUN: 46 mg/dL — ABNORMAL HIGH (ref 8–23)
CO2: <7 mmol/L — ABNORMAL LOW (ref 22–32)
Calcium: 11.1 mg/dL — ABNORMAL HIGH (ref 8.9–10.3)
Chloride: 108 mmol/L (ref 98–111)
Creatinine, Ser: 4.36 mg/dL — ABNORMAL HIGH (ref 0.61–1.24)
GFR calc Af Amer: 14 mL/min — ABNORMAL LOW (ref >60)
GFR calc non Af Amer: 12 mL/min — ABNORMAL LOW (ref >60)
Glucose, Bld: 113 mg/dL — ABNORMAL HIGH (ref 70–99)
Potassium: 5.4 mmol/L — ABNORMAL HIGH (ref 3.5–5.1)
Sodium: 139 mmol/L (ref 135–145)
Total Bilirubin: 2.2 mg/dL — ABNORMAL HIGH (ref 0.3–1.2)
Total Protein: 7.5 g/dL (ref 6.5–8.1)

## 2018-03-23 LAB — CBC WITH DIFFERENTIAL/PLATELET
Abs Immature Granulocytes: 1.16 10*3/uL — ABNORMAL HIGH (ref 0.00–0.07)
Basophils Absolute: 0 10*3/uL (ref 0.0–0.1)
Basophils Relative: 0 %
Eosinophils Absolute: 0 10*3/uL (ref 0.0–0.5)
Eosinophils Relative: 0 %
HCT: 25.3 % — ABNORMAL LOW (ref 39.0–52.0)
Hemoglobin: 6.7 g/dL — CL (ref 13.0–17.0)
Immature Granulocytes: 5 %
Lymphocytes Relative: 7 %
Lymphs Abs: 1.6 10*3/uL (ref 0.7–4.0)
MCH: 26.4 pg (ref 26.0–34.0)
MCHC: 26.5 g/dL — ABNORMAL LOW (ref 30.0–36.0)
MCV: 99.6 fL (ref 80.0–100.0)
Monocytes Absolute: 1.4 10*3/uL — ABNORMAL HIGH (ref 0.1–1.0)
Monocytes Relative: 7 %
Neutro Abs: 17.5 10*3/uL — ABNORMAL HIGH (ref 1.7–7.7)
Neutrophils Relative %: 81 %
Platelets: 384 10*3/uL (ref 150–400)
RBC: 2.54 MIL/uL — ABNORMAL LOW (ref 4.22–5.81)
RDW: 20.3 % — ABNORMAL HIGH (ref 11.5–15.5)
WBC: 21.7 10*3/uL — ABNORMAL HIGH (ref 4.0–10.5)
nRBC: 0.6 % — ABNORMAL HIGH (ref 0.0–0.2)

## 2018-03-23 LAB — CBG MONITORING, ED: Glucose-Capillary: 80 mg/dL (ref 70–99)

## 2018-03-23 LAB — BRAIN NATRIURETIC PEPTIDE: B Natriuretic Peptide: 406.8 pg/mL — ABNORMAL HIGH (ref 0.0–100.0)

## 2018-03-23 LAB — TROPONIN I: Troponin I: 0.21 ng/mL (ref <0.03)

## 2018-03-23 LAB — LIPASE, BLOOD: Lipase: 35 U/L (ref 11–51)

## 2018-03-23 LAB — AMMONIA: Ammonia: 296 umol/L — ABNORMAL HIGH (ref 9–35)

## 2018-03-23 MED ORDER — SODIUM CHLORIDE 0.9 % IV BOLUS
1000.0000 mL | Freq: Once | INTRAVENOUS | Status: AC
  Administered 2018-03-23: 1000 mL via INTRAVENOUS

## 2018-04-14 NOTE — Code Documentation (Signed)
Assessed pt pulses, no femoral pulse noted. Agonal breathing  Disorganized cardiac rhythm. CPR initiated until code status conformation.

## 2018-04-14 NOTE — ED Provider Notes (Signed)
[facility] EMERGENCY DEPARTMENT Provider Note   CSN: [id number] Arrival date & time: Apr 20, 2018  1019    History   Chief Complaint Chief Complaint  Patient presents with  . Respiratory Distress    HPI [name] is a 73 y.o. male.  HPI   73 year old male brought in by EMS with altered mental status.  EMS reports that they were initially called out for respiratory distress.  On their arrival he seemed to be more altered and actually having respiratory issues.  His glucose was 22.  He was given D10 with improvement in his glucose into the 90s although his mental status did not improve with his blood sugar.  Little additional history immediately available.  No reported trauma or ingestion.  He is not responding verbally or following commands. Apparently last seemed like normal self around 11p. Found like this around 0700 and then EMS called later when he wasn't improving.   On cursory review of records, it reveals a recent admission for obstructive jaundice status post biliary stent placement.  He has a past history of prostate and pancreatic cancer.  He has apparently been having progressive fatigue, poor appetite and poor intake.  He was discharged with a plan for home hospice per most recent discharge summary approximately 6 weeks ago.  Past Medical History:  Diagnosis Date  . Acute MI, inferior wall ([place]) 2011  . Amputation finger    right first finger top portion age 21  . Aortic atherosclerosis ([place])   . ARF (acute renal failure) ([place]) 01/24/2015  . CHF (congestive heart failure) ([place])   . CKD (chronic kidney disease), stage III ([place]) 11/2015  . Coronary artery disease cardioloigst-  dr Stanford Breed   hx inferior MI 10-08-2009 emergency CABG x4  . DOE (dyspnea on exertion)    since starting chemo  . DVT (deep venous thrombosis) ([place]) 11/05/2016   right proximal-mid peroneal veins  . Fracture of left hip ([place])   . H/O hyperkalemia   . History of acute inferior  wall myocardial infarction 10/08/2009  . History of blood transfusion   . History of chemotherapy   . History of echocardiogram 03/2012   EF 45%, LAE, Mild MR, no residual VSD  . History of first degree heart block   . History of metabolic acidosis   . History of thrombocytopenia   . History of urethral stricture   . History of UTI   . Hyperlipidemia   . Hypertension   . Ischemic cardiomyopathy   . Kidney cysts    bilateral  . Multiple pulmonary nodules    Bilateral scattered  . Multiple thyroid nodules   . Obstructed, uropathy   . Obstructive uropathy   . Pancreatic carcinoma (HCC)    s/p SBRT  . Pneumonia    Childhood  . Prostate cancer ([place])   . S/P VSD repair    09/2009  . Systolic CHF, chronic ([place])   . VSD (ventricular septal defect) 09/2009   acute after MI    Patient Active Problem List   Diagnosis Date Noted  . Malnutrition of moderate degree 02/08/2018  . Elevated troponin 02/07/2018  . CVA (cerebral vascular accident) ([place]) 02/06/2018  . ARF (acute renal failure) ([place])   . Obstructive jaundice due to cancer ([place]) 12/27/2017  . CKD (chronic kidney disease), stage IV ([place]) 12/27/2017  . DVT (deep venous thrombosis) ([place]) 11/06/2016  . Malignant neoplasm of head of pancreas ([place]) 10/03/2016  . Pancreatic cancer ([place]) 09/12/2016  .  Pancreatic mass   . Malignant neoplasm of prostate ([place]) 07/25/2016  . Abdominal pain 11/17/2015  . Acute renal failure (ARF) ([place]) 11/17/2015  . Hydronephrosis 11/17/2015  . Urethral stricture 11/17/2015  . Acute cystitis with hematuria   . Hyperkalemia, diminished renal excretion   . Diabetes mellitus screening 02/14/2015  . Uropathy, obstructive 01/30/2015  . UTI (urinary tract infection) 01/30/2015  . Trichomonas infection 01/30/2015  . Hydronephrosis determined by ultrasound   . Metabolic acidemia   . Protein-calorie malnutrition ([place])   . Anemia   . Acute renal failure superimposed on stage 3 chronic kidney disease  ([place]) 01/25/2015  . Acute left flank pain 01/25/2015  . Hyperkalemia 01/25/2015  . Thrombocytosis ([place]) 01/25/2015  . Metabolic acidosis, increased anion gap (I[facility]) 01/25/2015  . Hyperlipidemia 02/19/2012  . Cardiomyopathy, ischemic 02/19/2012  . Coronary atherosclerosis of native coronary artery 07/01/2010  . Other and unspecified hyperlipidemia 07/01/2010  . Chronic systolic CHF (congestive heart failure) ([place]) 12/11/2009  . HYPERTENSION, BENIGN 11/07/2009  . CAD, AUTOLOGOUS BYPASS GRAFT 11/07/2009  . Ventricular septal defect 11/07/2009  . MURMUR 11/07/2009    Past Surgical History:  Procedure Laterality Date  . BILIARY STENT PLACEMENT N/A 12/29/2017   Procedure: BILIARY STENT PLACEMENT;  Surgeon: Clarene Essex, MD;  Location: [facility];  Service: Endoscopy;  Laterality: N/A;  . CARDIAC CATHETERIZATION    . CORONARY ARTERY BYPASS GRAFT     times 4  . CYSTOSCOPY W/ RETROGRADES N/A 04/03/2015   Procedure:  BLADDER BIOPSIES;  Surgeon: Festus Aloe, MD;  Location: [facility];  Service: Urology;  Laterality: N/A;  . CYSTOSCOPY W/ RETROGRADES Bilateral 06/27/2016   Procedure: CYSTOSCOPY WITH BILATERAL RETROGRADES;  Surgeon: Festus Aloe, MD;  Location: [facility];  Service: Urology;  Laterality: Bilateral;  . CYSTOSCOPY WITH BIOPSY N/A 06/27/2016   Procedure: CYSTOSCOPY WITH BLADDER BIOPSY URETHRAL BIOPSY;  Surgeon: Festus Aloe, MD;  Location: [facility];  Service: Urology;  Laterality: N/A;  . CYSTOSCOPY WITH URETHRAL DILATATION N/A 04/03/2015   Procedure: CYSTOSCOPY WITH BILATERAL RETROGRADES;  Surgeon: Festus Aloe, MD;  Location: [facility];  Service: Urology;  Laterality: N/A;  . ENDOSCOPIC RETROGRADE CHOLANGIOPANCREATOGRAPHY (ERCP) WITH PROPOFOL N/A 12/29/2017   Procedure: ENDOSCOPIC RETROGRADE CHOLANGIOPANCREATOGRAPHY (ERCP) WITH PROPOFOL;  Surgeon: Clarene Essex, MD;  Location: [facility];  Service: Endoscopy;  Laterality: N/A;  . EUS N/A 08/28/2016   Procedure: UPPER ENDOSCOPIC  ULTRASOUND (EUS) RADIAL;  Surgeon: Milus Banister, MD;  Location: [facility];  Service: Endoscopy;  Laterality: N/A;  . flexible cystoscopy    . GOLD SEED IMPLANT N/A 07/03/2017   Procedure: GOLD SEED IMPLANT;  Surgeon: Festus Aloe, MD;  Location: [facility];  Service: Urology;  Laterality: N/A;  Needs Ultrasound Tech  . REMOVAL OF STONES  12/29/2017   Procedure: REMOVAL OF STONES;  Surgeon: Clarene Essex, MD;  Location: [facility];  Service: Endoscopy;;  Balloon Sweep of Duct  . repair of post infarction posterior ventricular septal defect  09/2009  . SPACE OAR INSTILLATION N/A 07/03/2017   Procedure: SPACE OAR INSTILLATION;  Surgeon: Festus Aloe, MD;  Location[facility];  Service: Urology;  Laterality: N/A;  . SPHINCTEROTOMY  12/29/2017   Procedure: SPHINCTEROTOMY;  Surgeon: Clarene Essex, MD;  Location: [facility];  Service: Endoscopy;;  . TRANSURETHRAL RESECTION OF PROSTATE  06/27/2016   Procedure: TRANSURETHRAL RESECTION OF THE PROSTATE (TURP);  Surgeon: Festus Aloe, MD;  Location: [facility];  Service: Urology;;  . UPPER GI ENDOSCOPY  Home Medications    Prior to Admission medications   Medication Sig Start Date End Date Taking? Authorizing Provider  aspirin EC 81 MG tablet Take 1 tablet (81 mg total) by mouth daily. 09/24/17   Lelon Perla, MD  megestrol (MEGACE) 400 MG/10ML suspension Take 10 mLs (400 mg total) by mouth daily. 01/02/18   Roxan Hockey, MD  ondansetron (ZOFRAN) 4 MG tablet Take 1 tablet (4 mg total) by mouth every 6 (six) hours as needed for nausea or vomiting. 01/25/18   Truitt Merle, MD  sodium bicarbonate 650 MG tablet Take 1 tablet (650 mg total) by mouth 2 (two) times daily. 01/01/18   Roxan Hockey, MD  tamsulosin (FLOMAX) 0.4 MG CAPS capsule Take 1 capsule (0.4 mg total) by mouth daily after supper. 01/01/18   Roxan Hockey, MD   Family History Family History  Problem Relation Age of Onset  .  Cancer Paternal Aunt        unknown type cancer   . Cancer Paternal Uncle        unknown type cancer   Social History Social History   Tobacco Use  . Smoking status: Former Smoker    Packs/day: 1.50    Years: 45.00    Pack years: 67.50    Types: Cigarettes    Last attempt to quit: 01/13/2010    Years since quitting: 8.1  . Smokeless tobacco: Never Used  Substance Use Topics  . Alcohol use: No    Comment: occasional   . Drug use: No   Allergies   Chlorhexidine gluconate  Review of Systems Review of Systems Level 5 caveat because of confusion.   Physical Exam Updated Vital Signs There were no vitals taken for this visit.  Physical Exam Vitals signs and nursing note reviewed.  Constitutional:      General: He is in acute distress.     Appearance: He is well-developed. He is ill-appearing.  HENT:     Head: Normocephalic and atraumatic.  Eyes:     General:        Right eye: No discharge.        Left eye: No discharge.     Conjunctiva/sclera: Conjunctivae normal.  Neck:     Musculoskeletal: Neck supple.  Cardiovascular:     Rate and Rhythm: Normal rate and regular rhythm.     Heart sounds: Normal heart sounds. No murmur. No friction rub. No gallop.   Pulmonary:     Effort: Pulmonary effort is normal.  Abdominal:     General: There is no distension.     Palpations: Abdomen is soft.     Tenderness: There is no abdominal tenderness.  Musculoskeletal:        General: No tenderness.  Skin:    General: Skin is warm and dry.  Neurological:     Comments: Nonverbal. Eyes open. Seems to have L gaze preference. Not following commands. R sided weakness?     ED Treatments / Results  Labs (all labs ordered are listed, but only abnormal results are displayed) Labs Reviewed  CBC WITH DIFFERENTIAL/PLATELET - Abnormal; Notable for the following components:      Result Value   WBC 21.7 (*)    RBC 2.54 (*)    Hemoglobin 6.7 (*)    HCT 25.3 (*)    MCHC 26.5 (*)    RDW  20.3 (*)    nRBC 0.6 (*)    Neutro Abs 17.5 (*)    Monocytes Absolute 1.4 (*)  Abs Immature Granulocytes 1.16 (*)    All other components within normal limits  COMPREHENSIVE METABOLIC PANEL - Abnormal; Notable for the following components:   Potassium 5.4 (*)    CO2 <7 (*)    Glucose, Bld 113 (*)    BUN 46 (*)    Creatinine, Ser 4.36 (*)    Calcium 11.1 (*)    Albumin 2.2 (*)    AST 9,426 (*)    ALT 2,997 (*)    Alkaline Phosphatase 433 (*)    Total Bilirubin 2.2 (*)    GFR calc non Af Amer 12 (*)    GFR calc Af Amer 14 (*)    All other components within normal limits  AMMONIA - Abnormal; Notable for the following components:   Ammonia 296 (*)    All other components within normal limits  BRAIN NATRIURETIC PEPTIDE - Abnormal; Notable for the following components:   B Natriuretic Peptide 406.8 (*)    All other components within normal limits  TROPONIN I - Abnormal; Notable for the following components:   Troponin I 0.21 (*)    All other components within normal limits  LIPASE, BLOOD  CBG MONITORING, ED   EKG EKG Interpretation  Date/Time:  04-06-18 10:29:43 EDT Ventricular Rate:  98 PR Interval:    QRS Duration: 104 QT Interval:  377 QTC Calculation: 482 R Axis:   62 Text Interpretation:  Sinus rhythm Prolonged PR interval Nonspecific repol abnormality, diffuse leads Baseline wander in lead(s) V4 V6 Interpretation limited secondary to artifact Confirmed by Virgel Manifold [phone]) on 04-06-2018 11:30:06 AM   Radiology Ct Head Wo Contrast  Result Date: 04-06-18 CLINICAL DATA:  Altered mental status. Hypoglycemia. Unresponsive. EXAM: CT HEAD WITHOUT CONTRAST TECHNIQUE: Contiguous axial images were obtained from the base of the skull through the vertex without intravenous contrast. COMPARISON:  MR head 02/07/2018. CT head 02/06/2018. FINDINGS: The patient was unable to remain motionless for the exam. Small or subtle lesions could be overlooked. Brain: No  definite acute stroke, acute hemorrhage, mass lesion, hydrocephalus, or extra-axial fluid. RIGHT hemisphere encephalomalacia was demonstrated on prior MR, related to an old stroke. This is most prominent in the RIGHT occipital lobe. Atrophy and small vessel disease was better demonstrated on previous studies. Vascular: Calcification of the cavernous internal carotid arteries consistent with cerebrovascular atherosclerotic disease. No signs of intracranial large vessel occlusion. Skull: Calvarium grossly intact. Sinuses/Orbits: Mucosal thickening in both maxillary sinuses. Negative orbits. Other: None. IMPRESSION: Motion degraded exam demonstrating no definite acute intracranial findings. Chronic RIGHT hemisphere encephalomalacia related to an old stroke. Electronically Signed   By: Staci Righter M.D.   On: 04/06/18 11:18   Dg Chest Portable 1 View  Result Date: 06-Apr-2018 CLINICAL DATA:  Shortness of breath. Dyspnea. Altered mental status. EXAM: PORTABLE CHEST 1 VIEW COMPARISON:  02/06/2018 and 03/09/2015 FINDINGS: The heart size and pulmonary vascularity are normal. CABG. Slight increase in the small left pleural effusion with minimal left base atelectasis. Right lung is clear except for slight new peribronchial thickening. IMPRESSION: 1. Slight increase in small nonspecific left pleural effusion with minimal left base atelectasis. 2. New slight peribronchial thickening consistent with bronchitis. Electronically Signed   By: Lorriane Shire M.D.   On: Apr 06, 2018 11:22    Procedures Procedures (including critical care time)  CRITICAL CARE Performed by: Virgel Manifold Total critical care time: 35 minutes Critical care time was exclusive of separately billable procedures and treating other patients. Critical care was necessary to treat  or prevent imminent or life-threatening deterioration. Critical care was time spent personally by me on the following activities: development of treatment plan with  patient and/or surrogate as well as nursing, discussions with consultants, evaluation of patient's response to treatment, examination of patient, obtaining history from patient or surrogate, ordering and performing treatments and interventions, ordering and review of laboratory studies, ordering and review of radiographic studies, pulse oximetry and re-evaluation of patient's condition.   Medications Ordered in ED Medications - No data to display  Initial Impression / Assessment and Plan / ED Course  I have reviewed the triage vital signs and the nursing notes.  Pertinent labs & imaging results that were available during my care of the patient were reviewed by me and considered in my medical decision making (see chart for details).   Final Clinical Impressions(s) / ED Diagnoses   Final diagnoses:  Anemia, unspecified type  Metabolic acidosis  Abnormal LFTs  Elevated troponin  Hyperammonemia (HCC)  Encephalopathy   72yM with AMS. Recent notes reviewed. It sounds like pt has been doing extremely poor as of late. Oncology did not feel he was a candidate for further surgical intervention or chemotherapy. One note stating "Patient declines further in the intervention at this time, patient wants to go home with home aide services to spend whatever time he has left with family."  He arrived extremely confused but protecting his airway. Repeat CBG 80. Appeared to have R sided neglect. Expedited to CT but did not show acute event. Shortly after return from CT he became increasingly bradycardic and lost pulses. I was called back to his room and CPR was in progress. PEA. Agonal breathing. In light of his significant underlying medical problems and prior wishes, I believed further efforts would be futile and only prolong his discomfort. I notified his son. Time of death 10:41.   ED Discharge Orders    None       Virgel Manifold, MD 04/02/18 1434

## 2018-04-14 NOTE — ED Notes (Signed)
Checklist reviewed with [name] Patient Placement.

## 2018-04-14 NOTE — ED Triage Notes (Addendum)
Per EMS: pt from home with c/o AMS and hypoglycemia. Upon EMS arrival pt found to be siting in chair unresponsive.  CBG on scene 22 - EMS administered D10 recheck of CBG 94.  Pt only responding to painful stimuli, along with a left sided gaze. Family reported pt's LKW 2300 last PM.  Also noted on arrival, pt had an incontinence episode.    EMS vitals: 100/48

## 2018-04-14 NOTE — Progress Notes (Signed)
Chaplain responded to call from ED that other son had arrived.  Chaplain offered more prayer and sacred words with family.  Offered ministry of support to ex-wife who was not bedside.  Family thanked staff for support. [name]

## 2018-04-14 NOTE — ED Notes (Signed)
Additional family arrived at the bedside.

## 2018-04-14 NOTE — ED Notes (Signed)
Pt transported to morgue 

## 2018-04-14 NOTE — ED Notes (Signed)
Family remains at the bedside.

## 2018-04-14 NOTE — ED Notes (Signed)
Paged Chaplain, family in consultation B.

## 2018-04-14 NOTE — Progress Notes (Signed)
Chaplain responded to spiritual consult. Patient in [place] passed. Provided ministry of presence to family.  Gave sister-in-law Patient Placement Card. Consulted with nurse. Two sons have been bedside.  Another on way. Will continue to provide care. Tamsen Snider Pager [phone]

## 2018-04-14 DEATH — deceased

## 2019-01-11 IMAGING — NM NM BONE WHOLE BODY
2 series · 2 of 2 positions shown · non-contrast
Comparison: None in PACs

CLINICAL DATA: New diagnosis of prostate malignancy.

EXAM:
NUCLEAR MEDICINE WHOLE BODY BONE SCAN
TECHNIQUE: Whole body anterior and posterior images were obtained approximately
3 hours after intravenous injection of radiopharmaceutical.
RADIOPHARMACEUTICALS:  20.4 mCi 7echnetium-33m MDP IV

[Series 1: whole body · 2.66mm/px · 1 of 1 slices shown (1 of 2)]
[im 1/1]
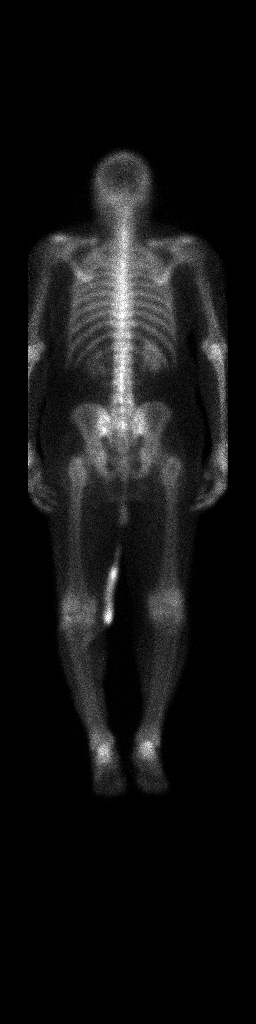

[Series 1: whole body · 2.66mm/px · 1 of 1 slices shown (2 of 2)]
[im 1/1]
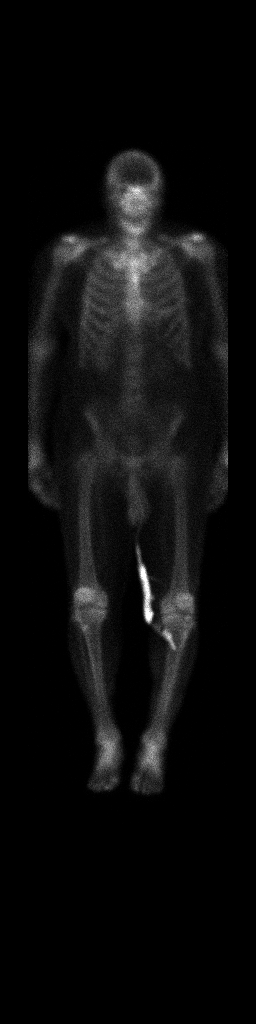

[2 of 2 positions shown; findings below may reference images not displayed]

FINDINGS: There is adequate uptake of the radiopharmaceutical by the skeleton.
There is adequate soft tissue clearance and renal activity. Uptake
within the calvarium, spine, ribs, pelvis, and upper extremities is
within the limits of normal. There is mildly increased uptake within
both knees and ankles compatible with degenerative change.
IMPRESSION: There are no findings suspicious for metastatic disease to the
skeleton.

## 2019-01-31 IMAGING — MR MR ABDOMEN WO/W CM
9 of 21 series · 21 of 48 positions shown · IV contrast (multihance)
Comparison: CT from 07/08/2016

CLINICAL DATA: Evaluate for pancreatic mass.

EXAM:
MRI ABDOMEN WITHOUT AND WITH CONTRAST
TECHNIQUE: Multiplanar multisequence MR imaging of the abdomen was performed
both before and after the administration of intravenous contrast.
CONTRAST:  10mL MULTIHANCE GADOBENATE DIMEGLUMINE 529 MG/ML IV SOLN

[Series 3: T2 fat-sat · axial · 5.0mm · 0.86mm/px · z∈[-144,+136]mm · 2 of 57 slices shown]
[im 1/57]
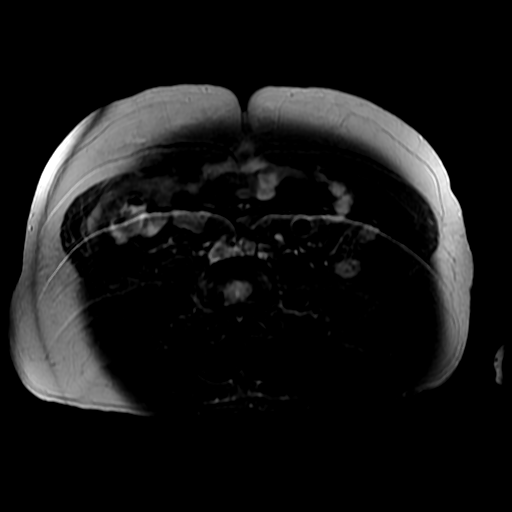
[im 57/57]
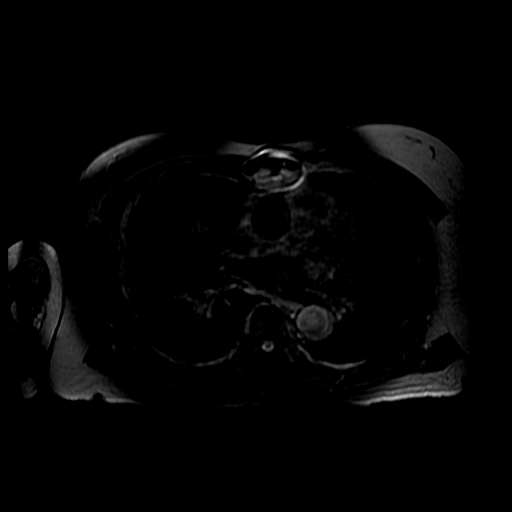

[Series 4: DWI b500 · axial · 6.0mm · 1.48mm/px · z∈[-115,+150]mm · 3 of 70 slices shown]
[im 1/70]
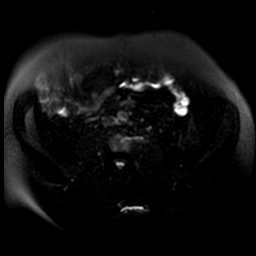
[im 35/70]
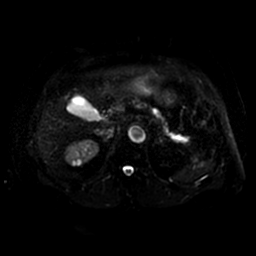
[im 70/70]
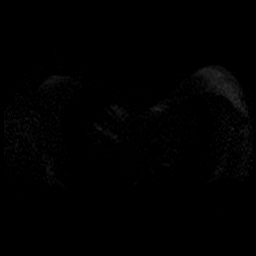

[Series 6: ax dualecho · axial · 5.0mm · 0.86mm/px · z∈[-144,+136]mm · 3 of 114 slices shown]
[im 1/114]
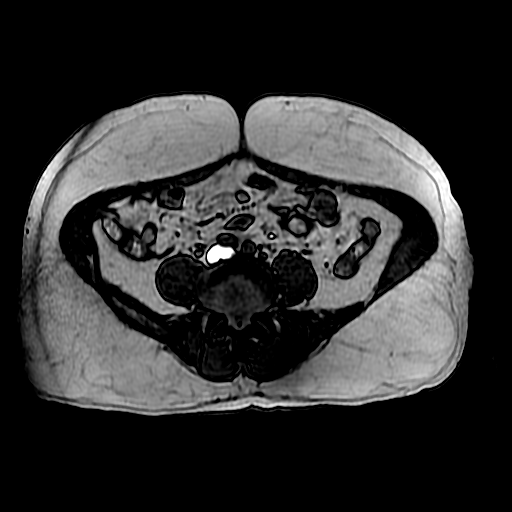
[im 57/114]
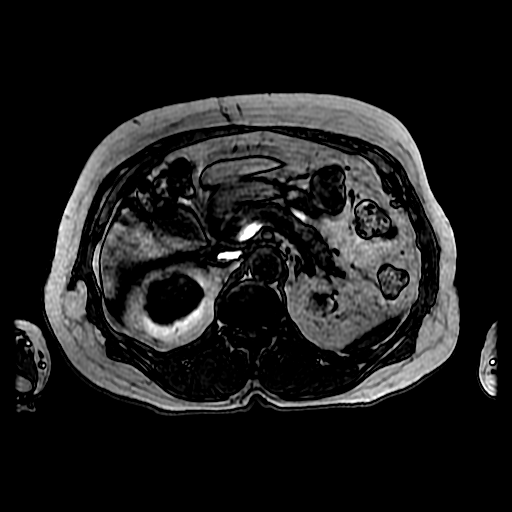
[im 114/114]
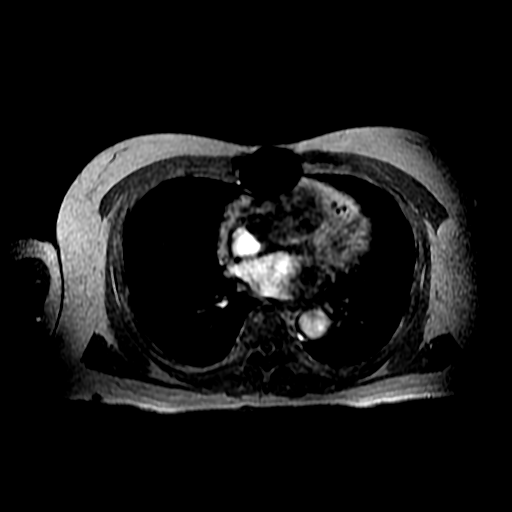

[Series 7: T2 · axial · 5.0mm · 0.86mm/px · 1 of 56 slices shown (1 of 2)]
[im 1/56]
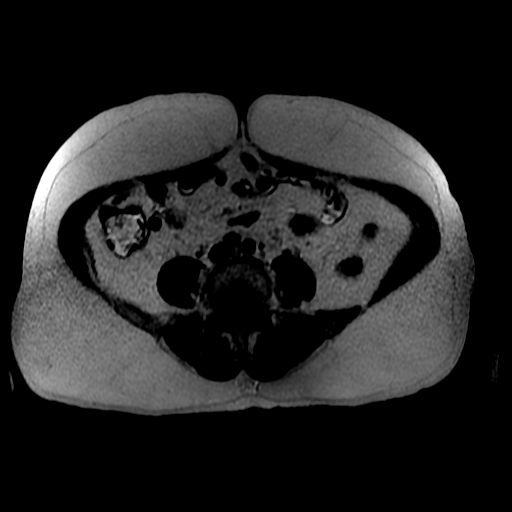

[Series 8: bSSFP · coronal · 5.0mm · 0.78mm/px · 1 of 48 slices shown]
[im 1/48]
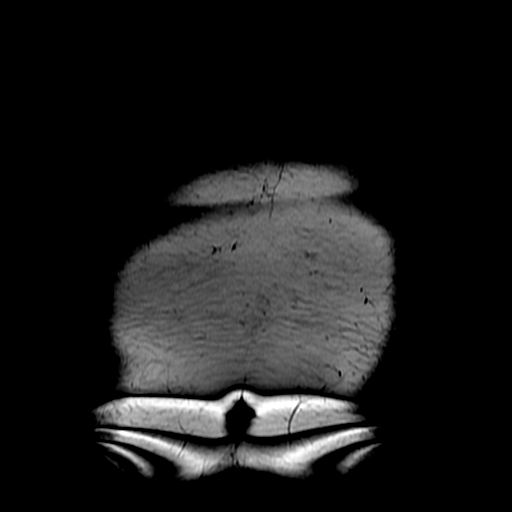

[Series 9: T2 · coronal · 5.0mm · 0.78mm/px · 1 of 48 slices shown (2 of 2)]
[im 1/48]
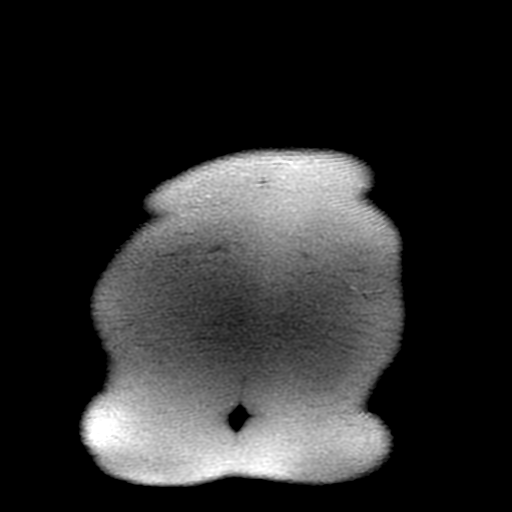

[Series 400: DWI · axial · 6.0mm · 1.48mm/px · 1 of 35 slices shown]
[im 1/35]
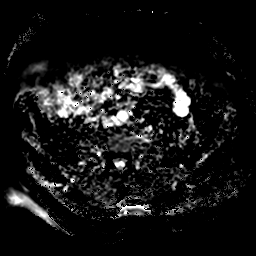

[Series 500: reformatted · axial · 1.6mm · 0.62mm/px · z∈[+14,+144]mm · 4 of 151 slices shown (1 of 2)]
[im 1/151]
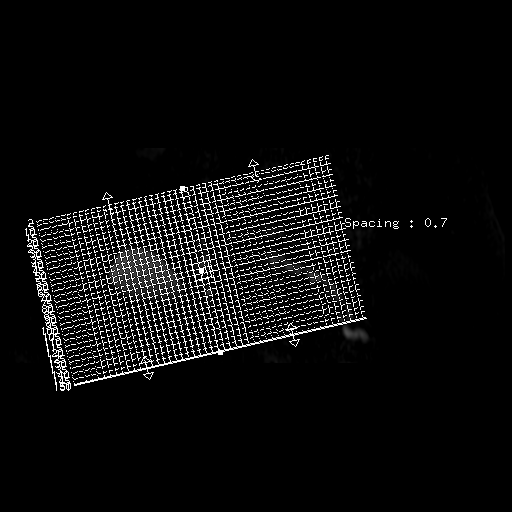
[im 51/151]
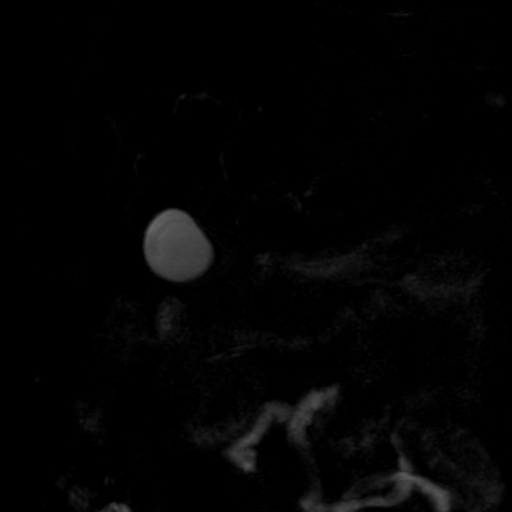
[im 101/151]
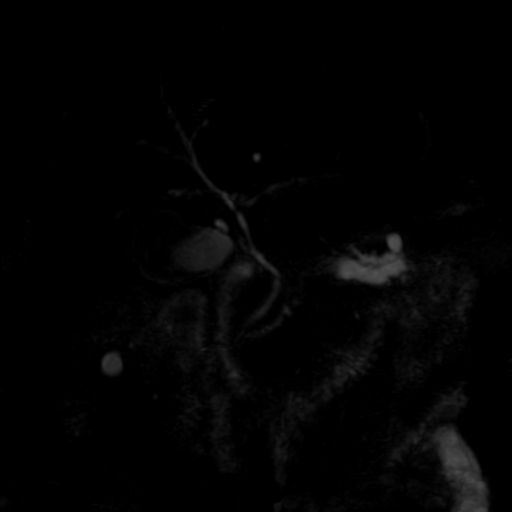
[im 151/151]
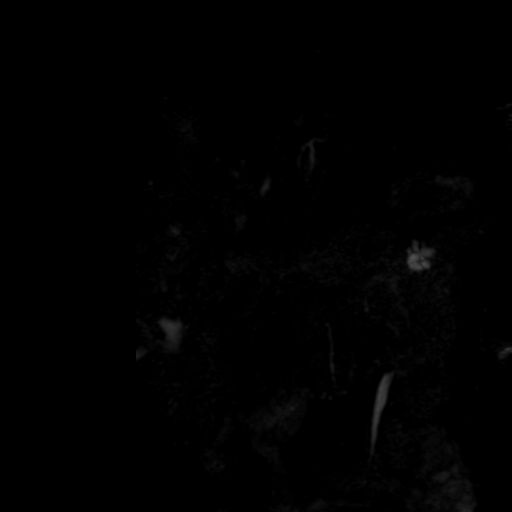

[Series 501: reformatted · axial · 1.6mm · 0.62mm/px · z∈[+14,+144]mm · 5 of 181 slices shown (2 of 2)]
[im 1/181]
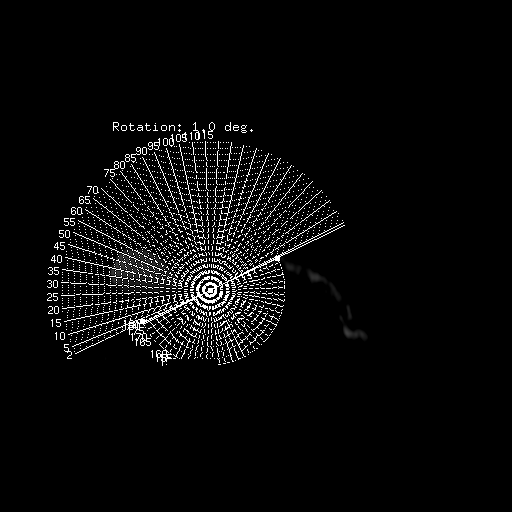
[im 46/181]
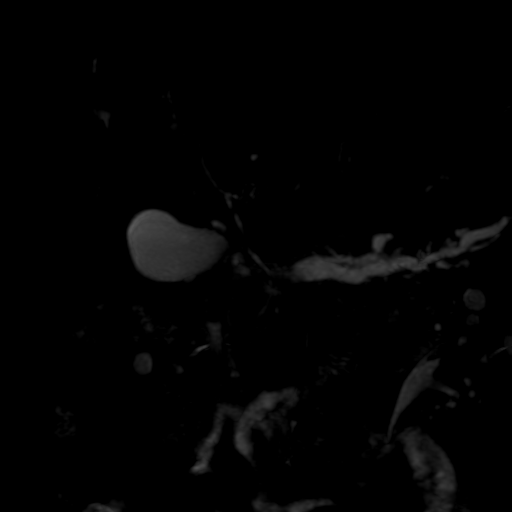
[im 91/181]
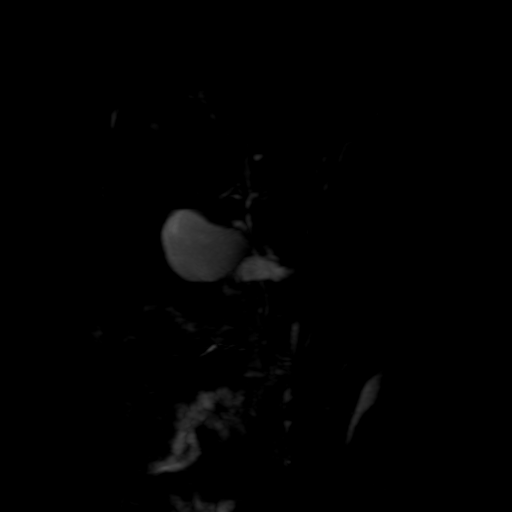
[im 136/181]
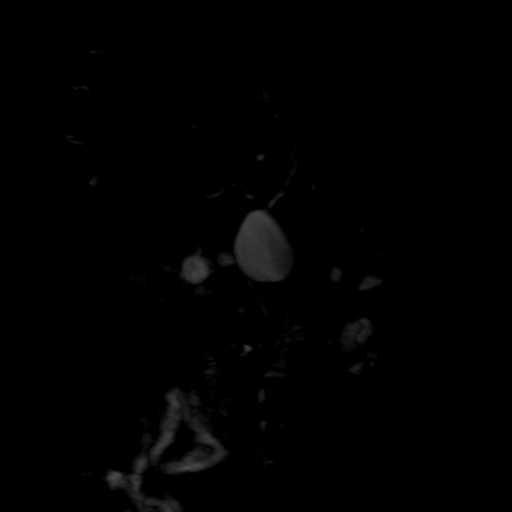
[im 181/181]
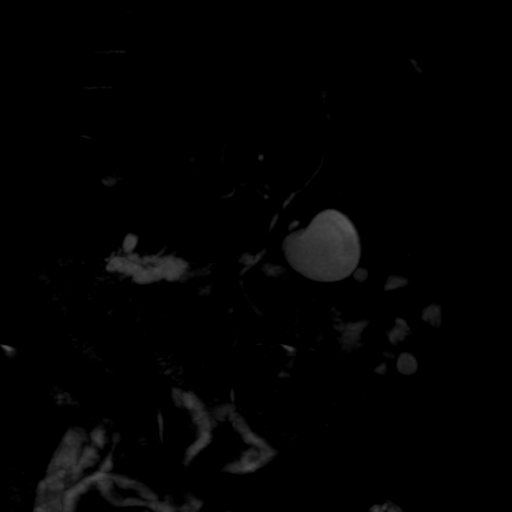

[21 of 48 positions shown; findings below may reference images not displayed]

FINDINGS: Lower chest: No acute findings.

Hepatobiliary: No mass or other parenchymal abnormality identified.
The gallbladder is within normal limits. No gallstones or biliary
dilatation.

Pancreas: There is a or area of relative hypoenhancement within the
head of pancreas which measures 2.8 x 2.0 cm, image 48 of series
0226. This appears to obstruct the pancreatic duct which measures
1.2 cm in diameter. There is atrophy of the neck, body and tail of
the pancreas. The portal vein and portal venous confluence appears
partially narrowed by this lesion. The celiac artery and superior
mesenteric artery are unremarkable.

Spleen:  Within normal limits in size and appearance.

Adrenals/Urinary Tract: The adrenal glands are unremarkable.
Previously noted high bilateral hydronephrosis has resolved in the
interval. Numerous bilateral kidney cysts are identified.

Stomach/Bowel: Visualized portions within the abdomen are
unremarkable.

Vascular/Lymphatic: No pathologically enlarged lymph nodes
identified. No abdominal aortic aneurysm demonstrated.

Other:  None.

Musculoskeletal: No suspicious bone lesions identified.
IMPRESSION: 1. There is a suspicious lesion involving the head of pancreas and
obstructing the pancreatic duct is identified. Involvement of the
portal vein and portal venous confluence is suspected. Findings
worrisome for pancreatic adenocarcinoma. Further investigation with
endoscopic ultrasound and tissue sampling advise.
2. Resolution of bilateral hydronephrosis. Bilateral kidney cysts
noted.
3. Aortic atherosclerosis.

## 2019-02-04 IMAGING — US US RENAL
1 series · 14 of 25 positions shown · non-contrast
Comparison: None.

CLINICAL DATA: Acute renal insufficiency.

EXAM:
RENAL / URINARY TRACT ULTRASOUND COMPLETE

[Series 1: us renal · 0.23mm/px · 14 of 38 slices shown]
[im 1/38]
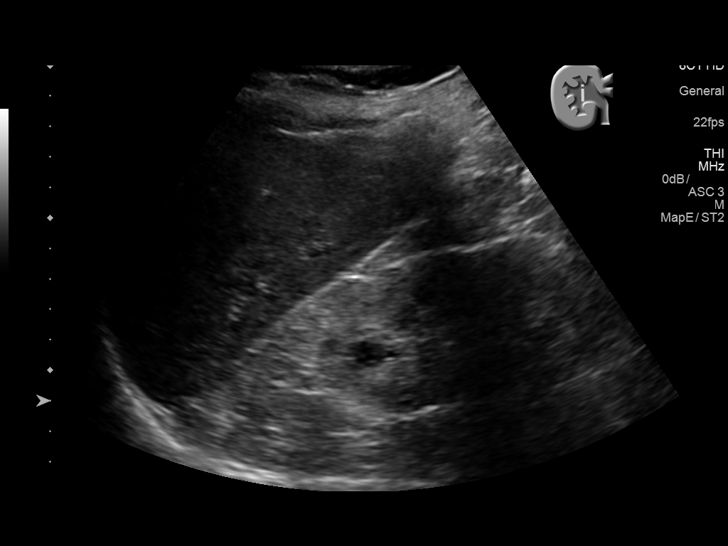
[im 4/38]
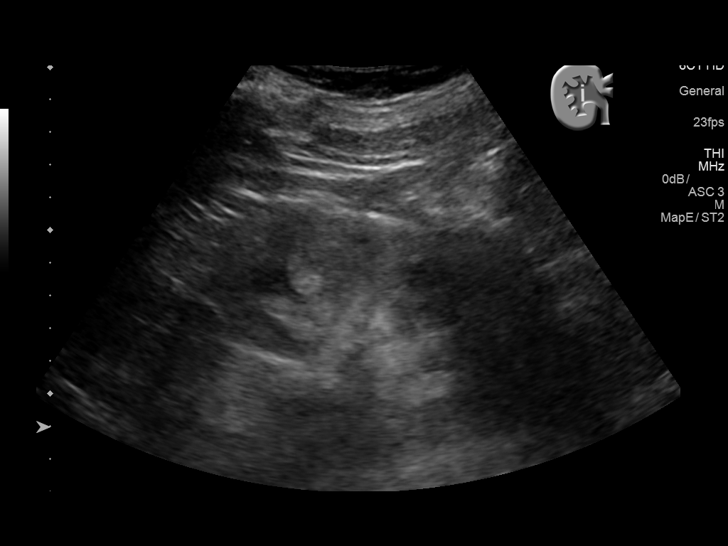
[im 7/38]
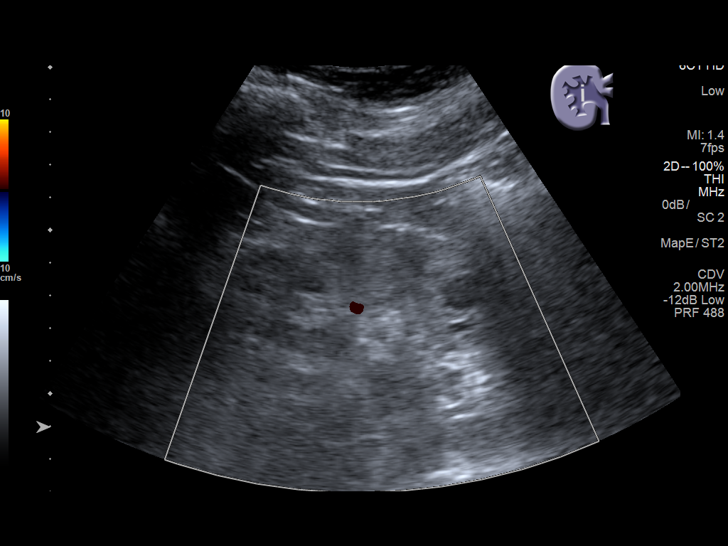
[im 10/38]
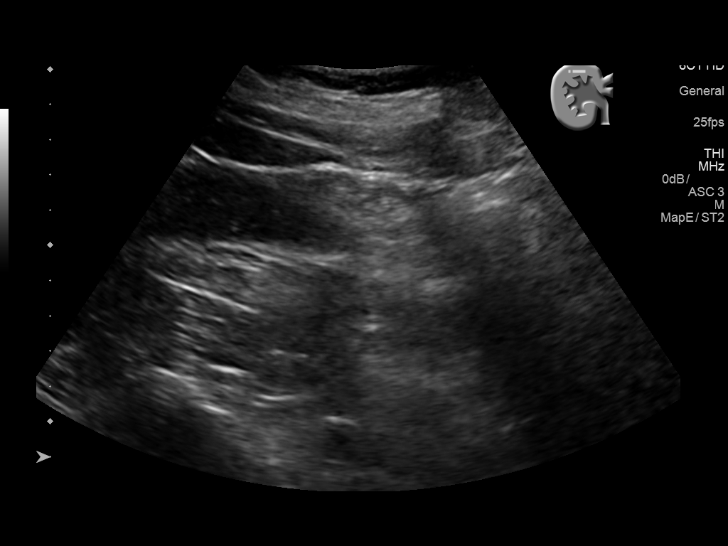
[im 13/38]
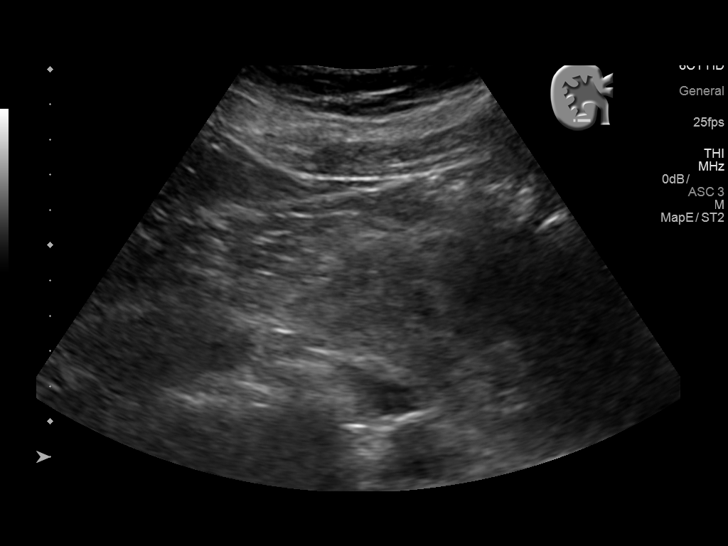
[im 14/38]
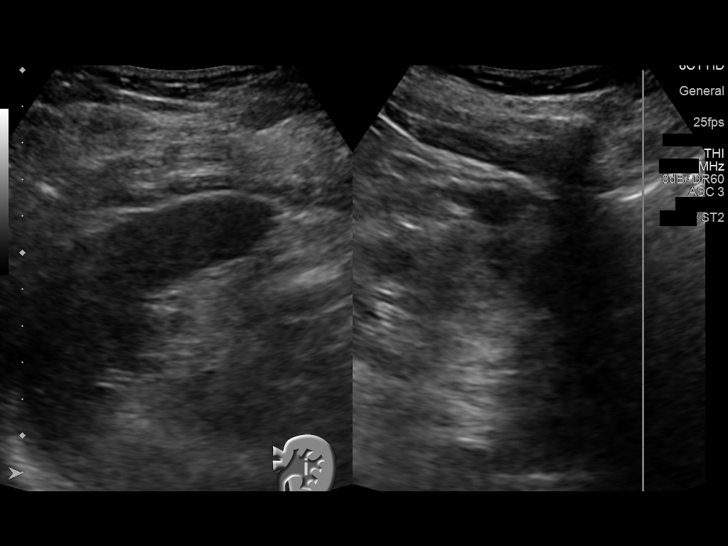
[im 17/38]
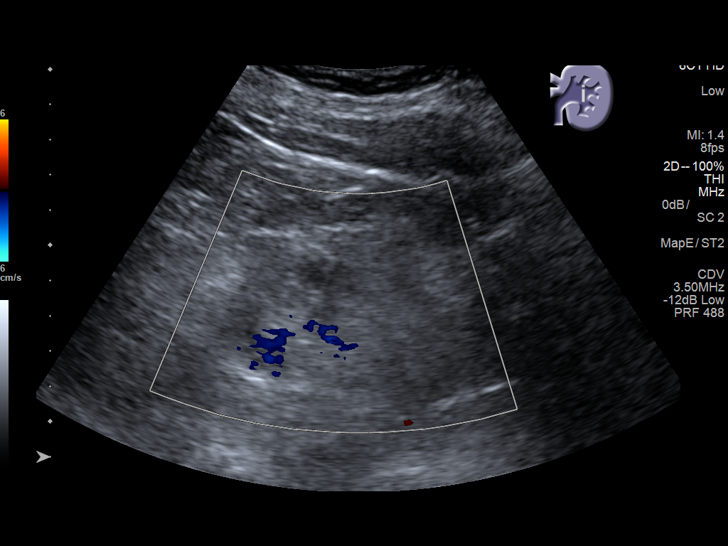
[im 21/38]
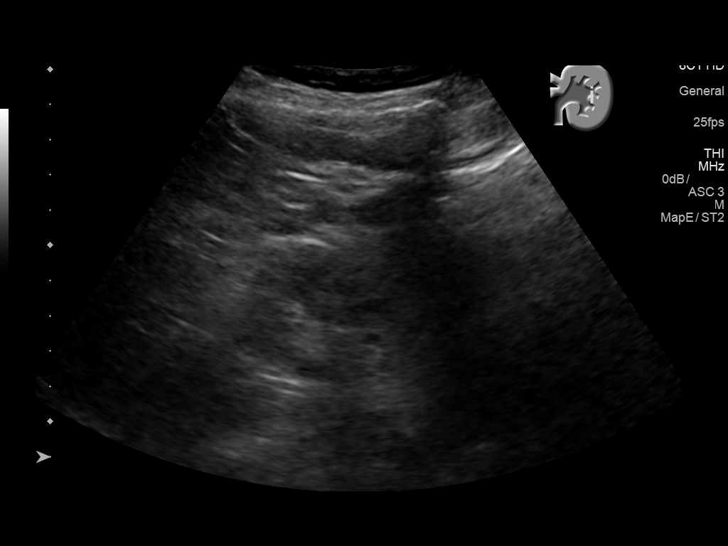
[im 24/38]
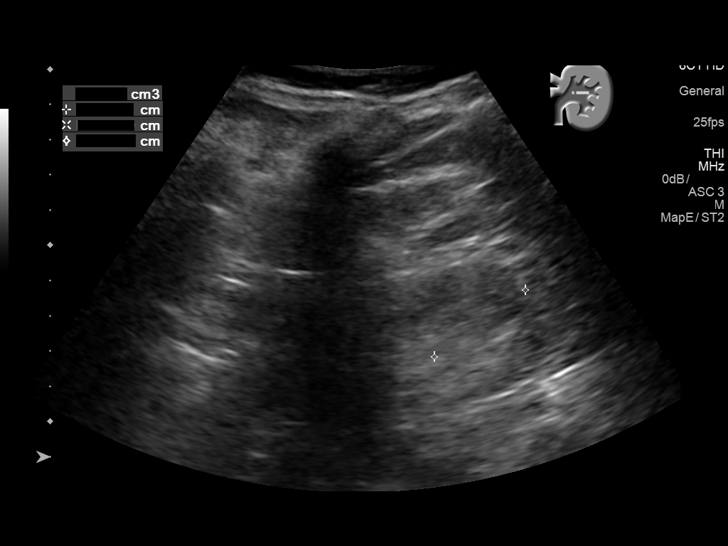
[im 25/38]
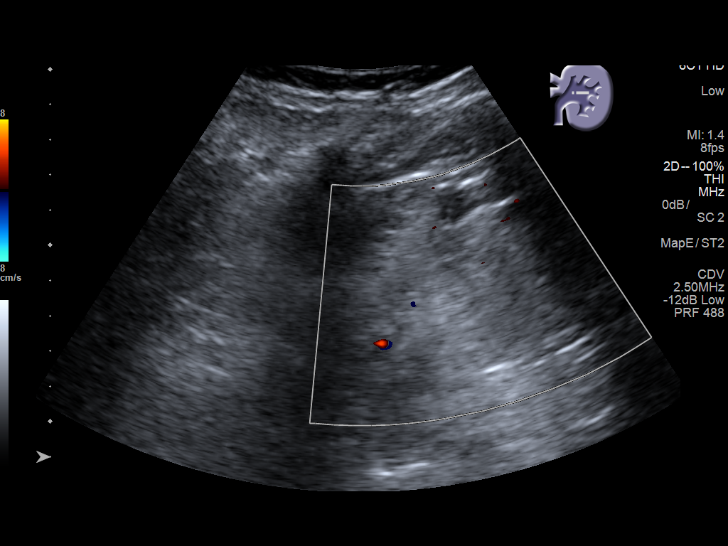
[im 28/38]
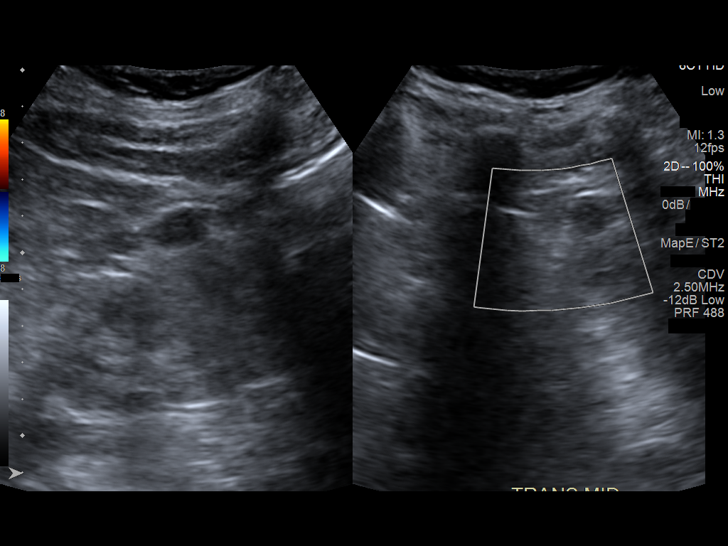
[im 31/38]
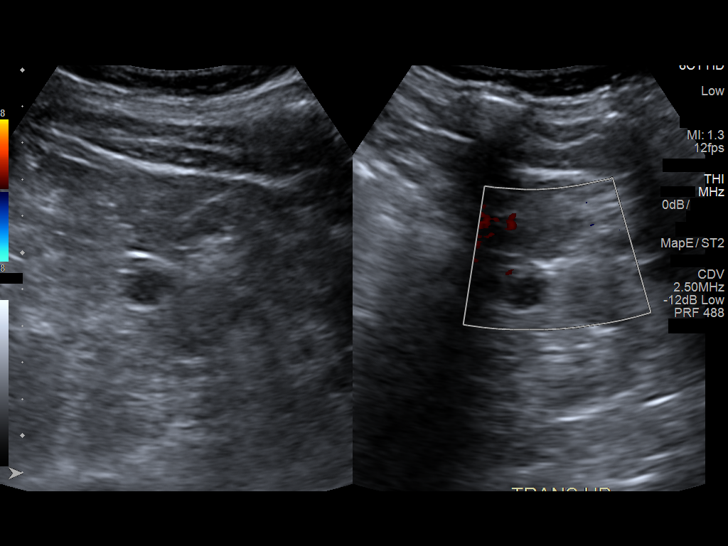
[im 34/38]
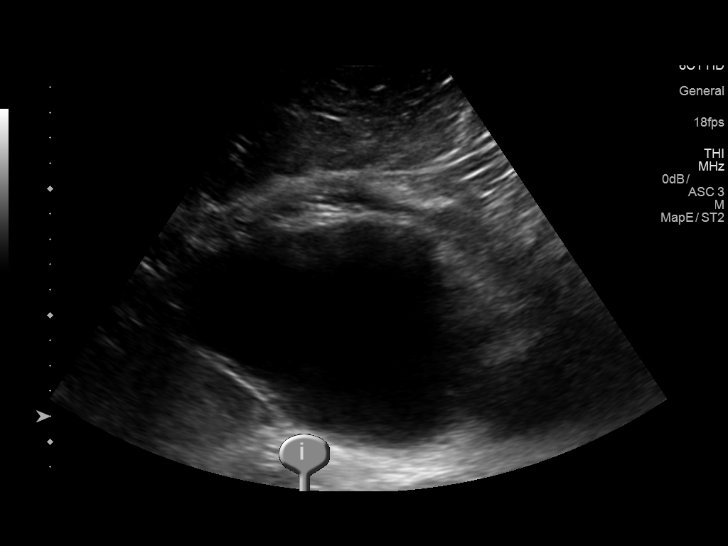
[im 38/38]
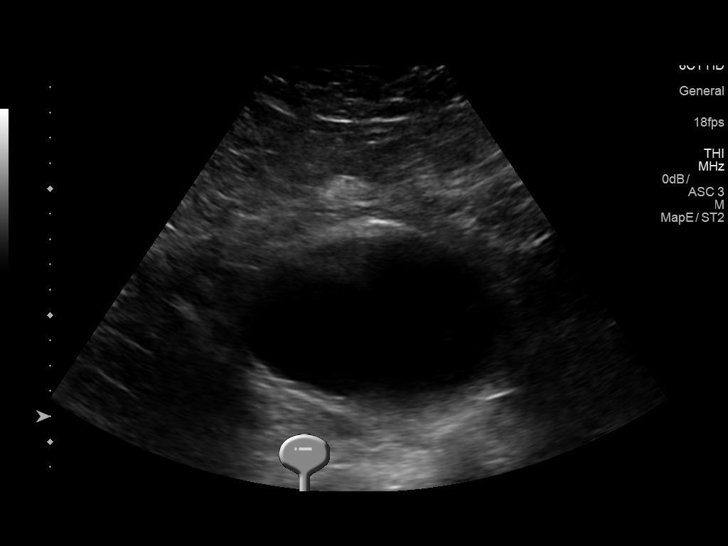

[14 of 25 positions shown; findings below may reference images not displayed]

FINDINGS: Right Kidney:

Renal measurements: 10.6 x 5.7 x 4.7 cm = volume: 135.2 mL .
Echogenicity within normal limits. No mass or hydronephrosis
visualized.

Left Kidney:

Renal measurements: 8.5 x 4.8 x 3.2 cm = volume: 68.4 mL. Contains 2
cysts with the largest measuring 1.7 cm.

Bladder:

Appears normal for degree of bladder distention.
IMPRESSION: 1. No cause for acute renal failure identified.  Left renal cysts.

## 2019-03-25 IMAGING — CT CT CHEST W/O CM
2 of 3 series · 14 of 36 positions shown, 17 images · non-contrast
Comparison: 07/08/2016 CT abdomen/ pelvis. 03/09/2015 chest
radiograph.

CLINICAL DATA: Chest staging. Recent diagnosis of prostate cancer 1
month prior and pancreatic cancer 2 months prior.

EXAM:
CT CHEST WITHOUT CONTRAST
TECHNIQUE: Multidetector CT imaging of the chest was performed following the
standard protocol without IV contrast.

[Series 2: thorax · axial · 0.74mm/px · z∈[+1444,+1690]mm · 11 of 145 slices shown, 14 images]
[im 11/145  mediastinal]
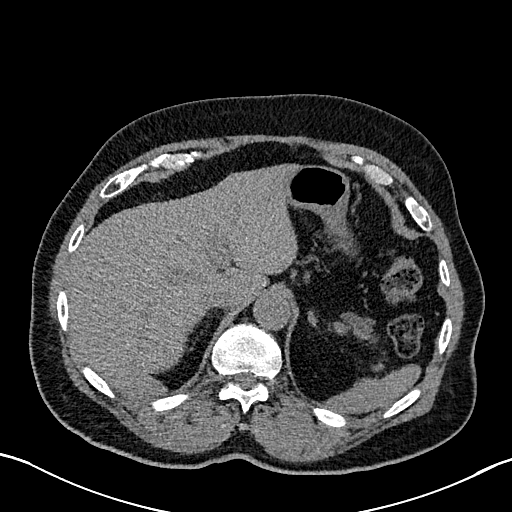
[im 11/145  lung]
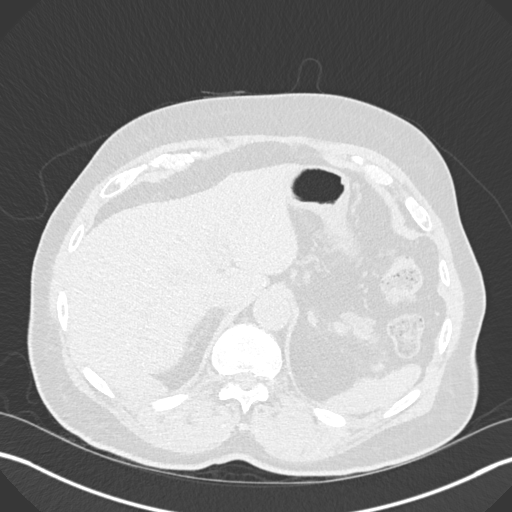
[im 22/145  lung]
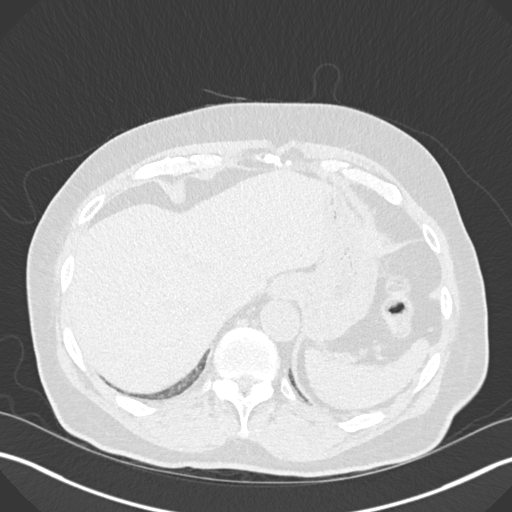
[im 33/145  lung]
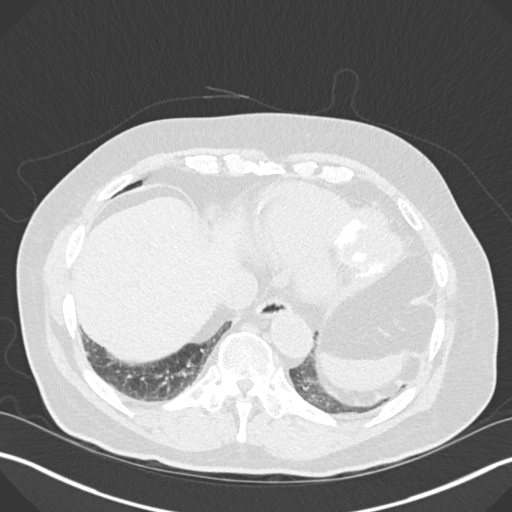
[im 49/145  lung]
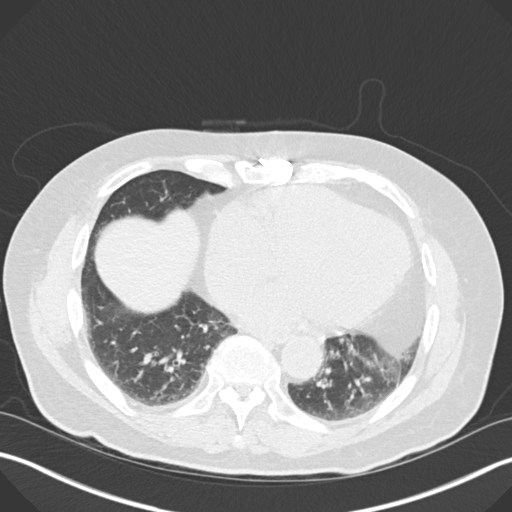
[im 59/145  mediastinal]
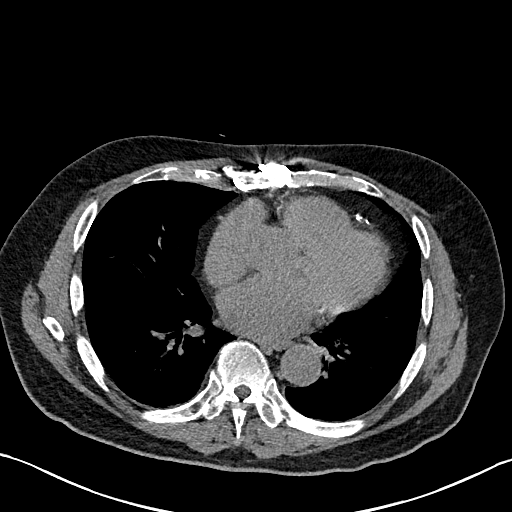
[im 59/145  lung]
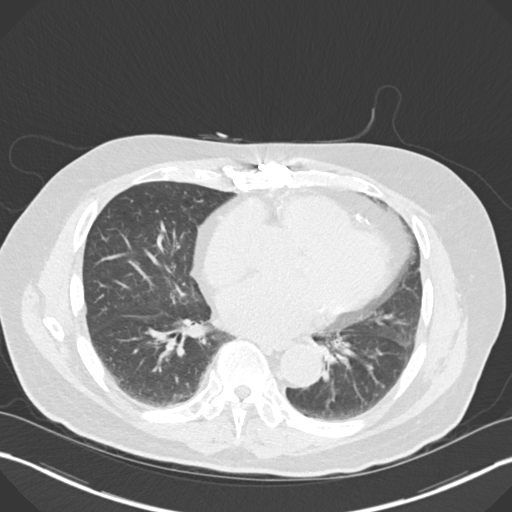
[im 75/145  lung]
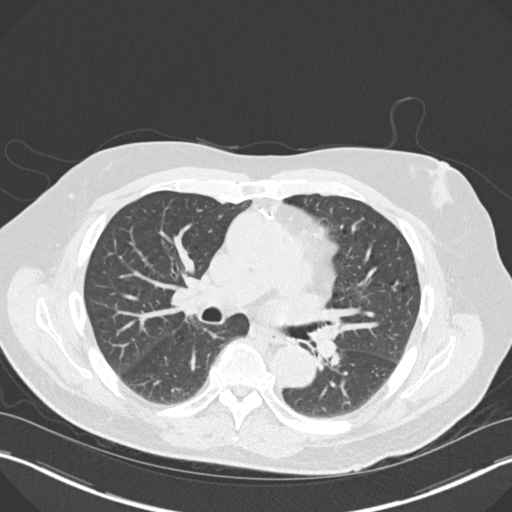
[im 86/145  lung]
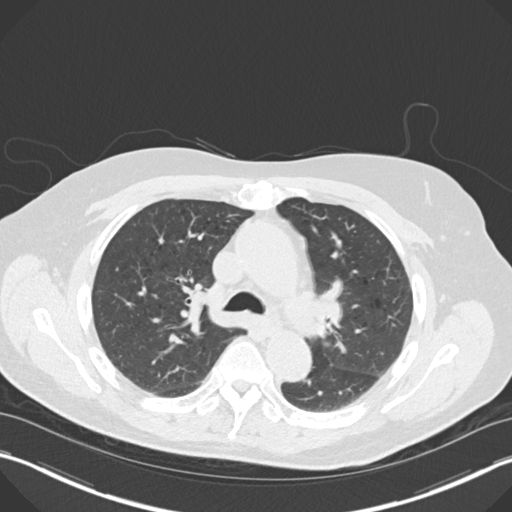
[im 97/145  lung]
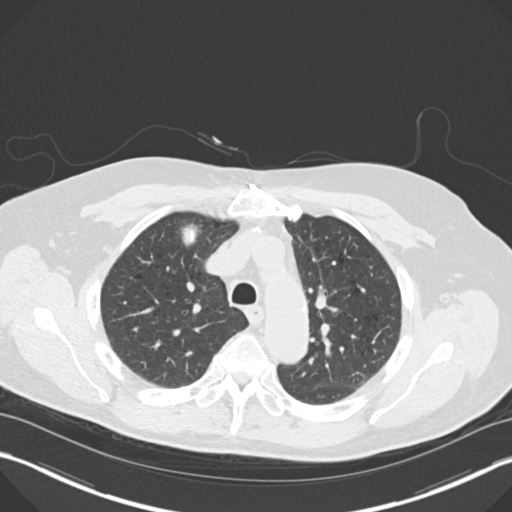
[im 113/145  mediastinal]
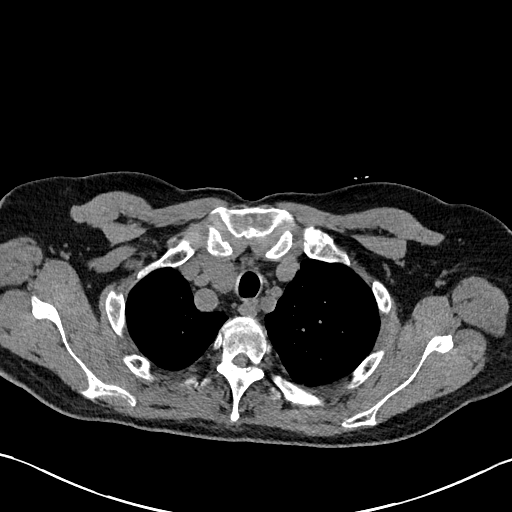
[im 113/145  lung]
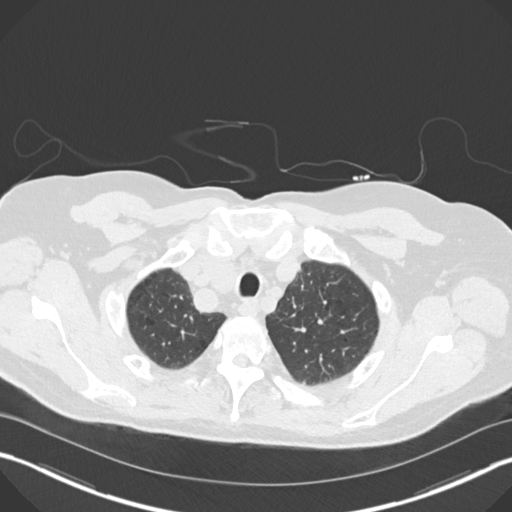
[im 123/145  lung]
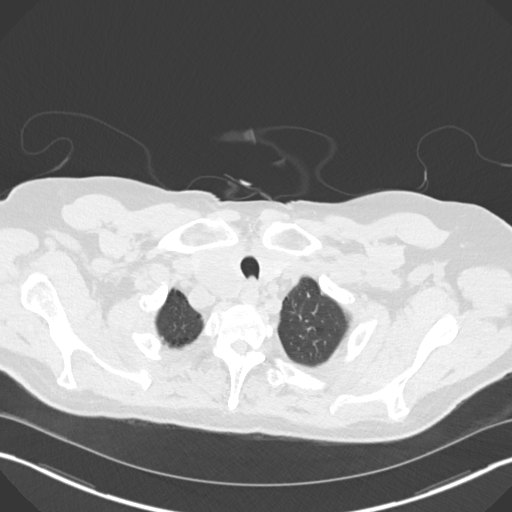
[im 134/145  lung]
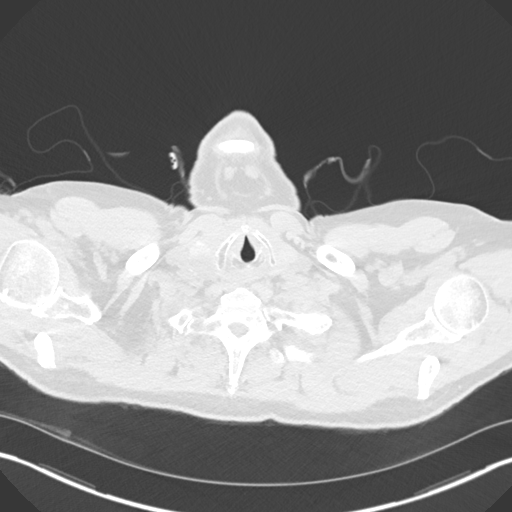

[Series 6: coronal · coronal · 0.58mm/px · 3 of 131 slices shown]
[im 27/131  lung]
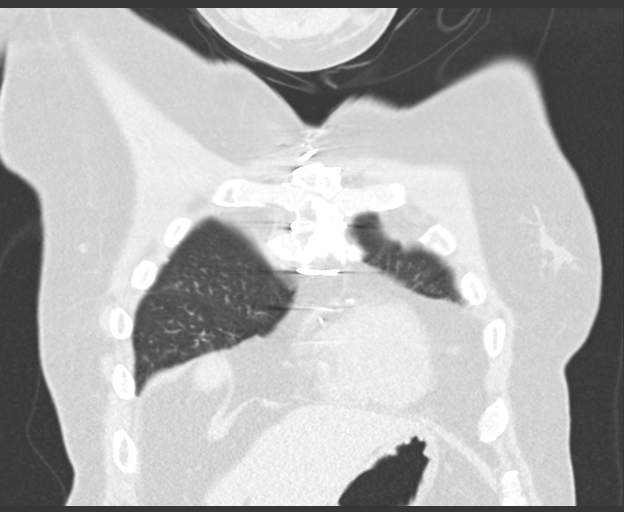
[im 53/131  lung]
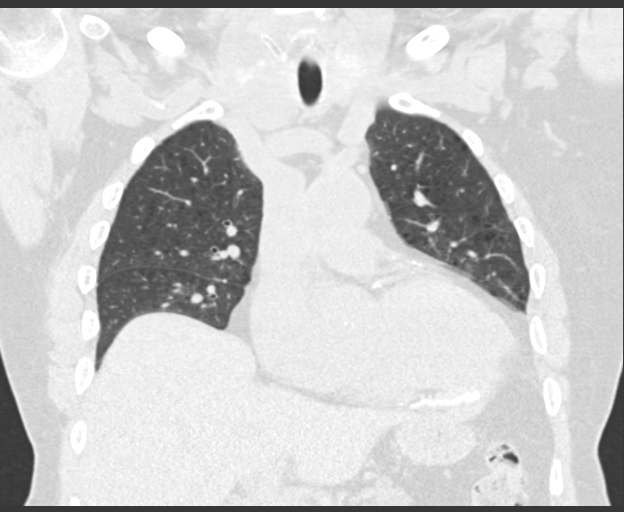
[im 79/131  lung]
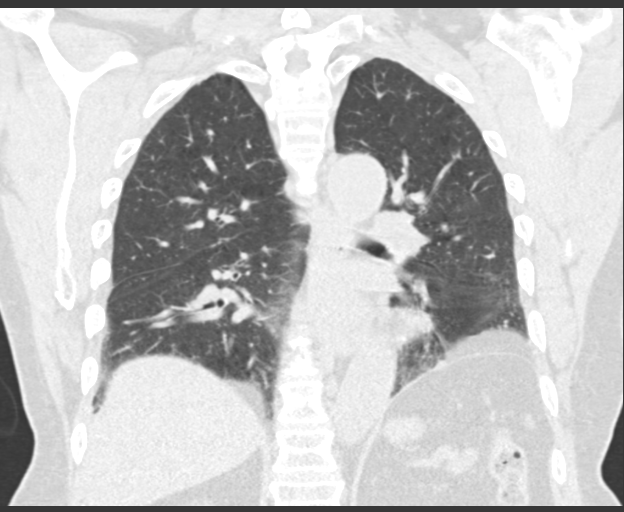

[14 of 36 positions shown; findings below may reference images not displayed]

FINDINGS: Cardiovascular: Mild cardiomegaly. No significant pericardial
fluid/thickening. Left anterior descending, left circumflex and
right coronary atherosclerosis status post CABG. Atherosclerotic
nonaneurysmal thoracic aorta. Normal caliber pulmonary arteries.

Mediastinum/Nodes: Multiple hypodense bilateral thyroid nodules,
largest 3.7 cm in the lower right thyroid lobe. Unremarkable
esophagus. No pathologically enlarged axillary, mediastinal or gross
hilar lymph nodes, noting limited sensitivity for the detection of
hilar adenopathy on this noncontrast study.

Lungs/Pleura: No pneumothorax. No pleural effusion. Mild
centrilobular and paraseptal emphysema with diffuse bronchial wall
thickening. No acute consolidative airspace disease or lung masses.
Ground-glass peripheral right upper lobe 1.1 x 1.0 cm pulmonary
nodule (series 5/ image 63). Several (at least 8) small solid
pulmonary nodules scattered in both lungs, predominantly subpleural,
largest 6 mm in the right middle lobe (series 5/ image 84).

Upper abdomen: Re- demonstration of diffuse pancreatic duct dilation
in the visualized pancreatic body and tail.

Musculoskeletal: No aggressive appearing focal osseous lesions. Mild
to moderate gynecomastia, asymmetric to the left.
IMPRESSION: 1. Several small solid pulmonary nodules scattered in both lungs,
largest 6 mm, indeterminate but more likely benign given
predominantly subpleural location of the nodules. Recommend initial
follow-up chest CT in 3 months.
2. No thoracic adenopathy or other potential findings of metastatic
disease in the chest.
3. Right upper lobe 1.1 cm ground-glass pulmonary nodule. Initial
follow-up with CT at 6-12 months is recommended to confirm
persistence. If persistent, repeat CT is recommended every 2 years
until 5 years of stability has been established. This recommendation
follows the consensus statement: Guidelines for Management of
Incidental Pulmonary Nodules Detected on CT Images: From the
4. Mild cardiomegaly. Three-vessel coronary atherosclerosis status
post CABG.
5. Multinodular goiter with dominant 3.7 cm right thyroid lobe
nodule, for which thyroid ultrasound correlation is warranted, which
may be performed if clinically warranted.
6. Re- demonstration of diffuse pancreatic duct dilation due to
known pancreatic malignancy.

Aortic Atherosclerosis (2H9PO-YHH.H) and Emphysema (2H9PO-H4L.D).

## 2019-05-31 IMAGING — CT CT CHEST W/O CM
2 of 4 series · 14 of 36 positions shown, 17 images · non-contrast
Comparison: CT chest dated 09/19/2016

CLINICAL DATA: Pancreatic cancer, on chemotherapy. History of
prostate cancer. Cough x1 week.

EXAM:
CT CHEST WITHOUT CONTRAST
TECHNIQUE: Multidetector CT imaging of the chest was performed following the
standard protocol without IV contrast.

[Series 2: thorax · axial · 0.78mm/px · z∈[+74,+340]mm · 11 of 157 slices shown, 14 images]
[im 12/157  mediastinal]
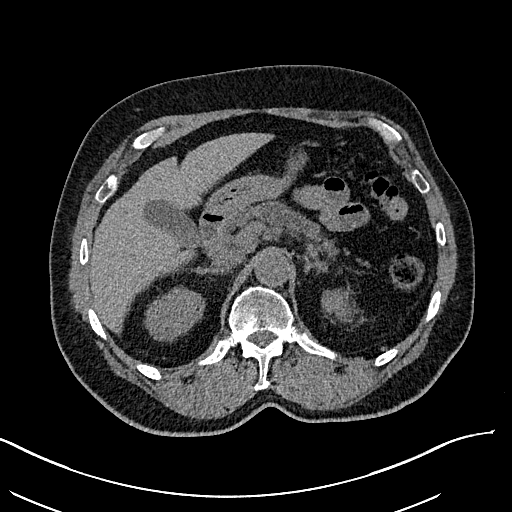
[im 12/157  lung]
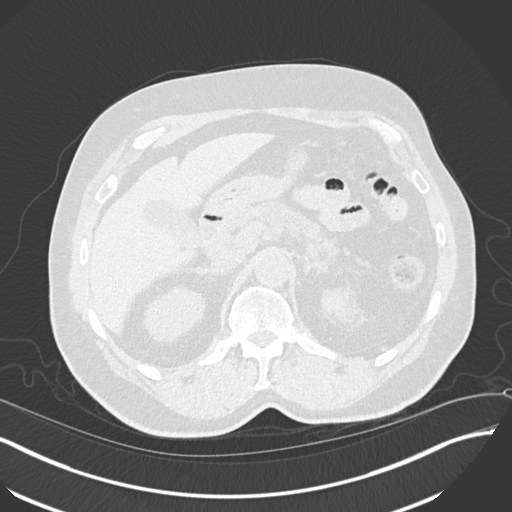
[im 23/157  lung]
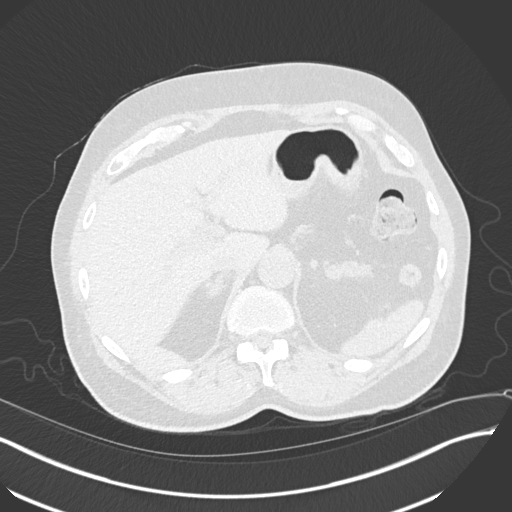
[im 34/157  lung]
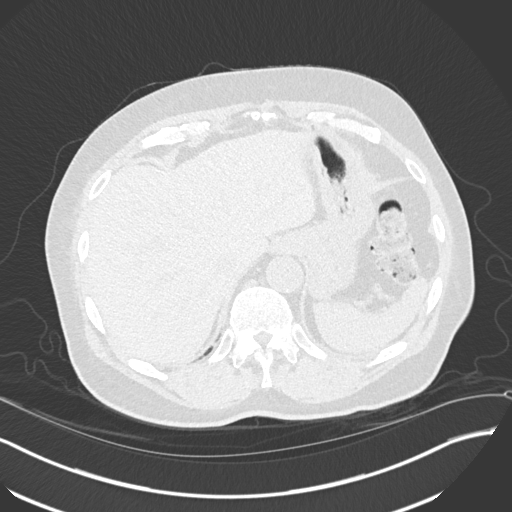
[im 56/157  lung]
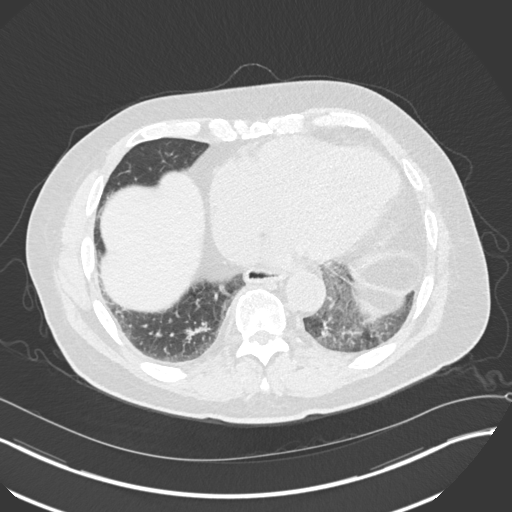
[im 67/157  mediastinal]
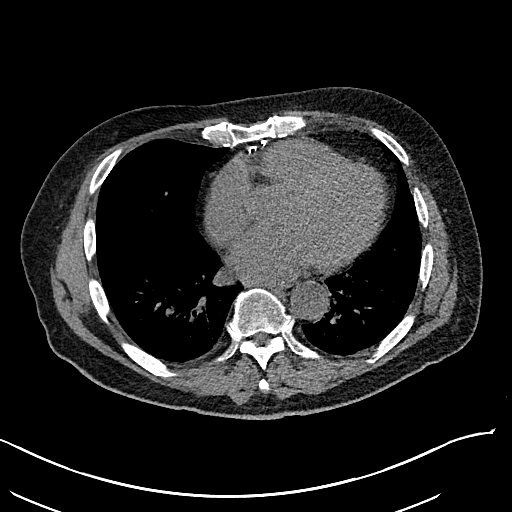
[im 67/157  lung]
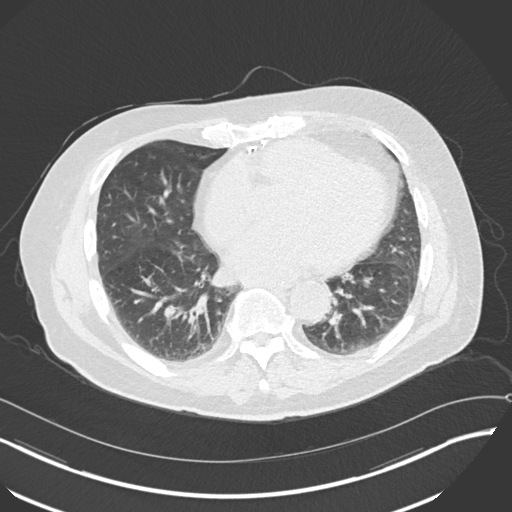
[im 79/157  lung]
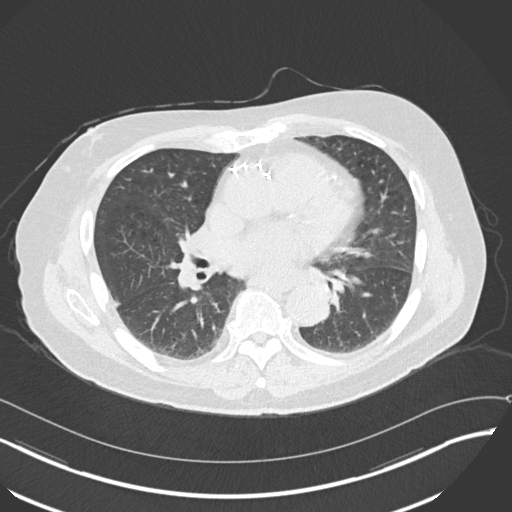
[im 90/157  lung]
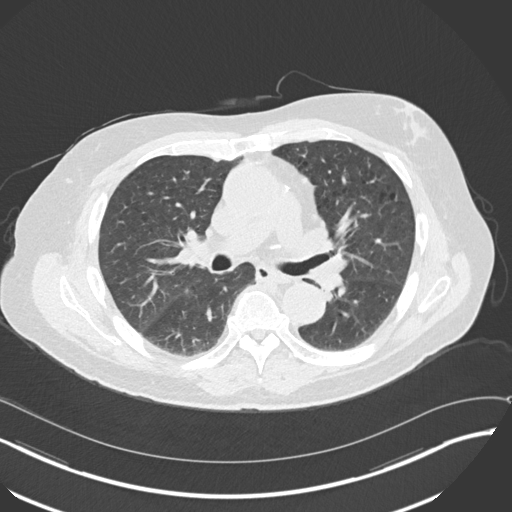
[im 101/157  lung]
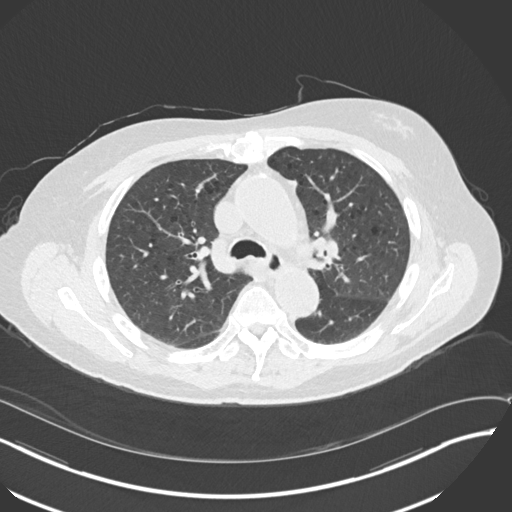
[im 123/157  mediastinal]
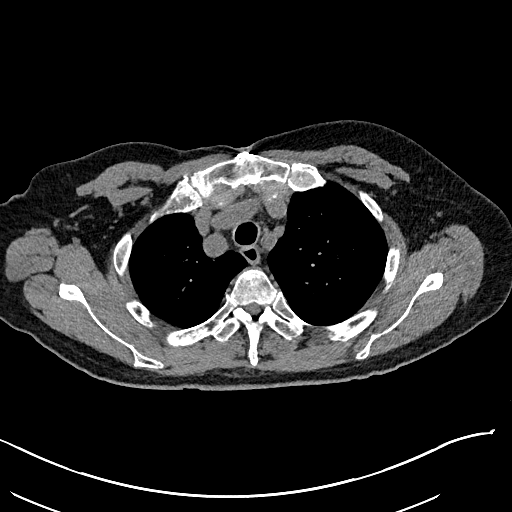
[im 123/157  lung]
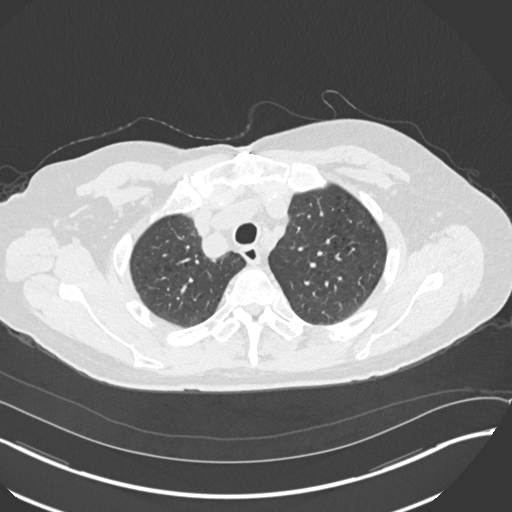
[im 134/157  lung]
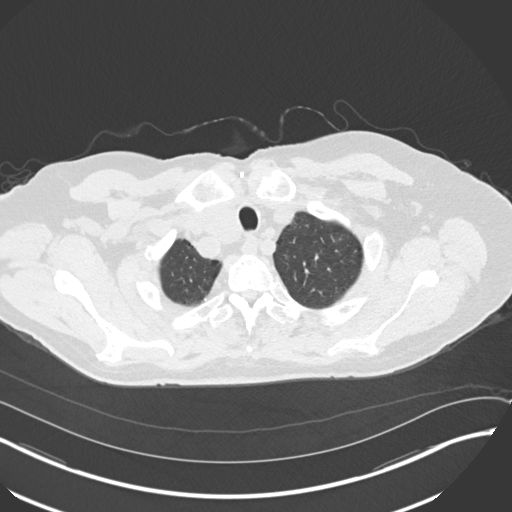
[im 145/157  lung]
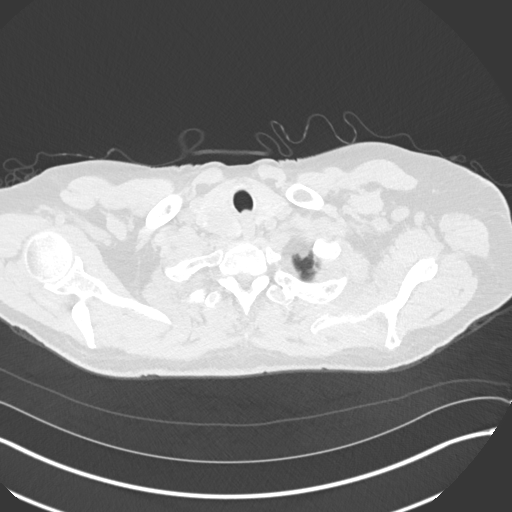

[Series 5: coronal · coronal · 0.63mm/px · 3 of 120 slices shown]
[im 24/120  lung]
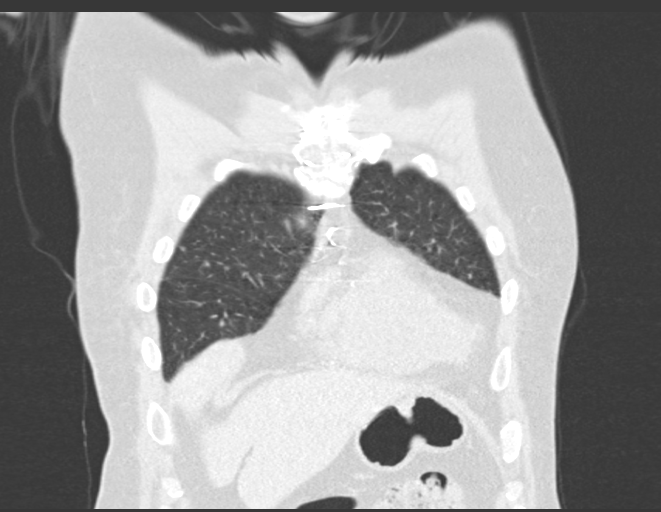
[im 48/120  lung]
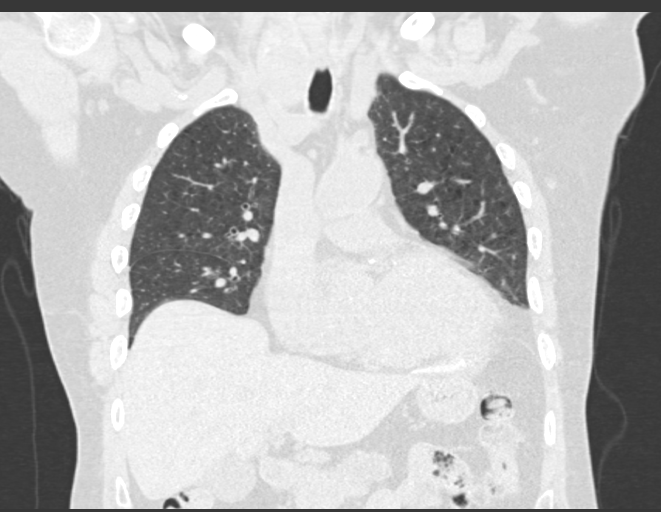
[im 72/120  lung]
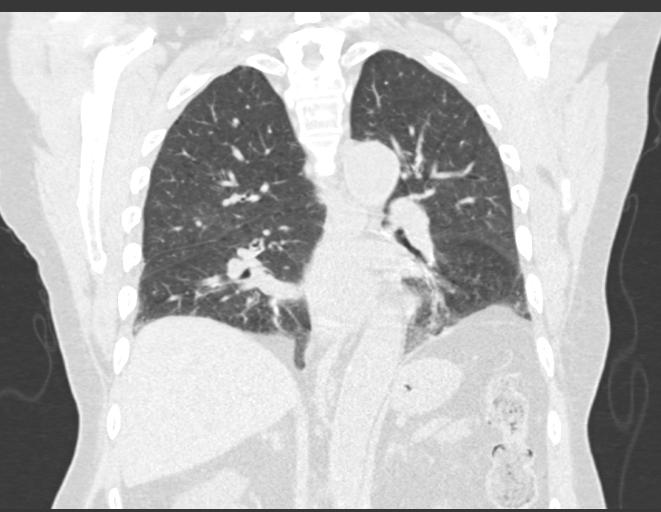

[14 of 36 positions shown; findings below may reference images not displayed]

FINDINGS: Cardiovascular: Heart is top-normal in size. No pericardial
effusion.

No evidence of thoracic aortic aneurysm. Mild atherosclerotic
calcifications of the aortic arch.

Three vessel coronary atherosclerosis. Postsurgical changes related
to prior CABG.

Mediastinum/Nodes: No suspicious mediastinal lymphadenopathy.

Multiple bilateral thyroid nodules including a stable dominant
cm right lower thyroid nodule.

Lungs/Pleura: Mild left apical pleural-parenchymal scarring.

Mild centrilobular and paraseptal emphysematous changes, upper lobe
predominant.

No focal consolidation.

Scattered bilateral pulmonary nodules, predominantly subpleural,
including a dominant 6 x 4 mm subpleural nodule in the lateral right
upper lobe (series 7/image 51) and a dominant 7 x 4 mm subpleural
nodule in the lateral right middle lobe (series 7/ image 86),
grossly unchanged. 6 mm nodule in the medial left lung base the
(series 7/ image 98), grossly unchanged.

6 mm ground-glass nodule in the lateral right upper lobe (series
7/image 62), less conspicuous than on the prior study.

Mild compressive atelectasis/scarring anteriorly at the left lung
base (series 7/image 98).

No pleural effusion or pneumothorax.

Upper Abdomen: Visualized upper abdomen is notable for pancreatic
duct dilatation related to known pancreatic malignancy, better
evaluated on dedicated MRI.

Musculoskeletal: Visualized osseous structures are within normal
limits. Median sternotomy.
IMPRESSION: Scattered bilateral pulmonary nodules measuring up to 6 mm, grossly
unchanged, favoring a benign etiology given the subpleural location.
However, given only two month stability, additional follow-up CT
chest is suggested in 4-6 months. (A short time interval is unlikely
to be definitive.)

6 mm ground-glass nodule in the lateral right upper lobe, less
conspicuous than on the prior study. Attention at the time of
follow-up is suggested.

Aortic Atherosclerosis (J3J08-O6G.G) and Emphysema ([id number].C).

## 2019-05-31 IMAGING — MR MR ABDOMEN W/O CM
7 of 12 series · 21 of 48 positions shown · non-contrast
Comparison: MRI of the abdomen 07/28/2016.

CLINICAL DATA: 71-year-old male with history of pancreatic mass.
Follow-up study.

EXAM:
MRI ABDOMEN WITHOUT CONTRAST
TECHNIQUE: Multiplanar multisequence MR imaging was performed without the
administration of intravenous contrast.

[Series 3: T2 fat-sat · axial · 5.0mm · 0.78mm/px · z∈[-99,+171]mm · 2 of 55 slices shown]
[im 1/55]
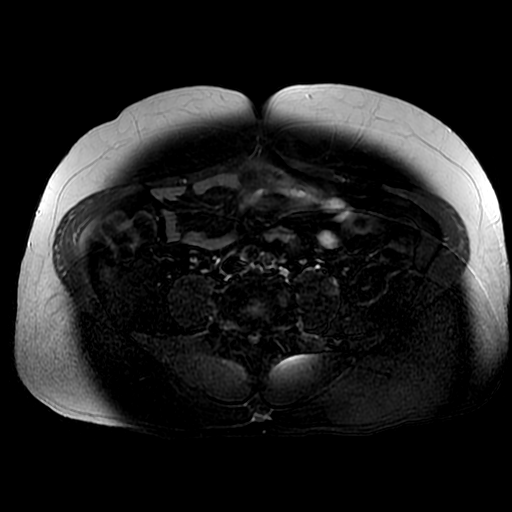
[im 55/55]
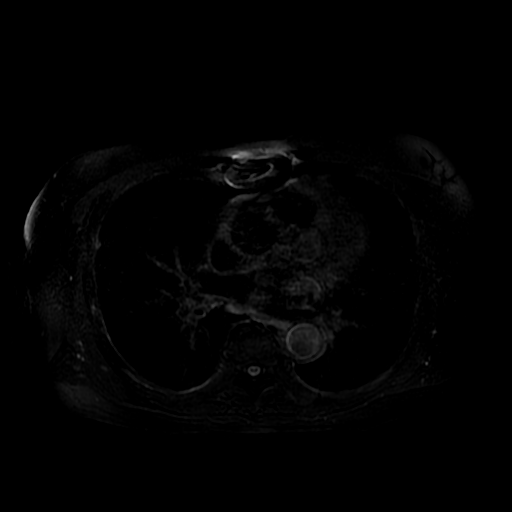

[Series 5: MRCP · coronal · 2.0mm · 0.70mm/px · 2 of 38 slices shown (1 of 2)]
[im 1/38]
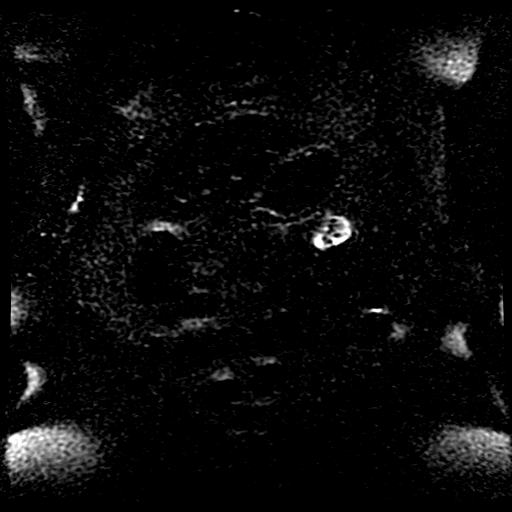
[im 38/38]
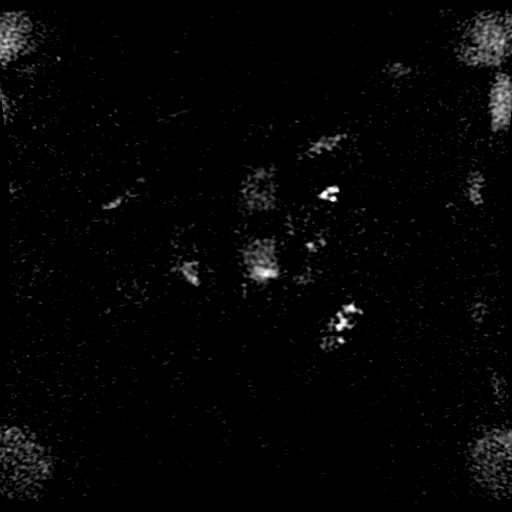

[Series 6: DWI b500 · axial · 6.0mm · 1.48mm/px · z∈[-112,+145]mm · 4 of 68 slices shown]
[im 1/68]
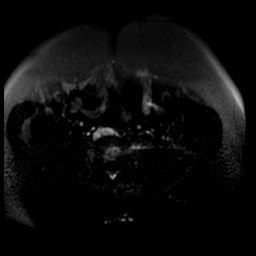
[im 23/68]
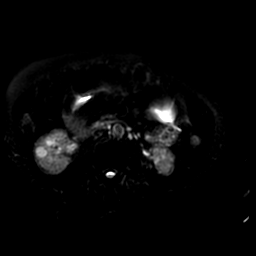
[im 45/68]
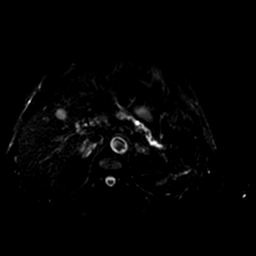
[im 68/68]
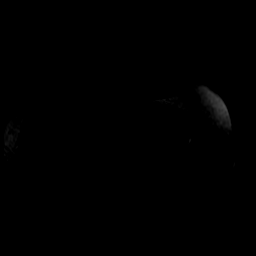

[Series 7: ax dualecho · axial · 5.0mm · 0.78mm/px · z∈[-113,+147]mm · 6 of 106 slices shown]
[im 1/106]
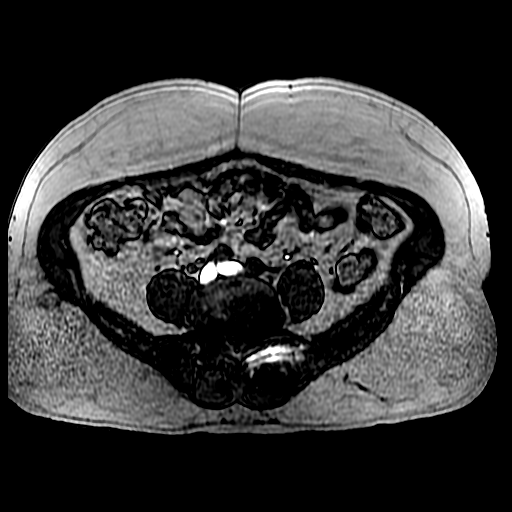
[im 22/106]
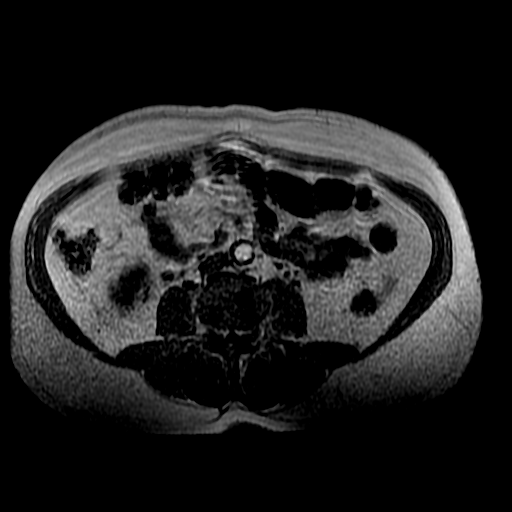
[im 43/106]
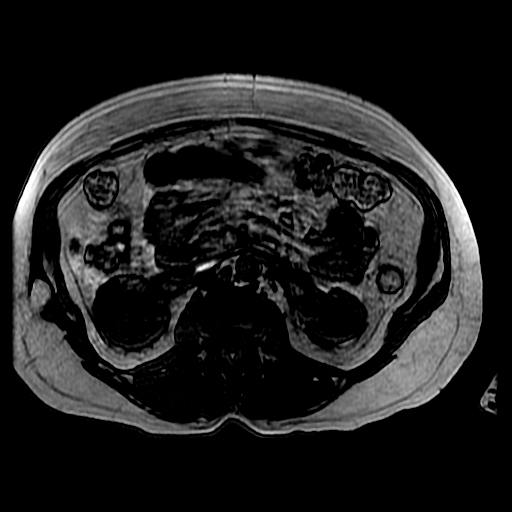
[im 64/106]
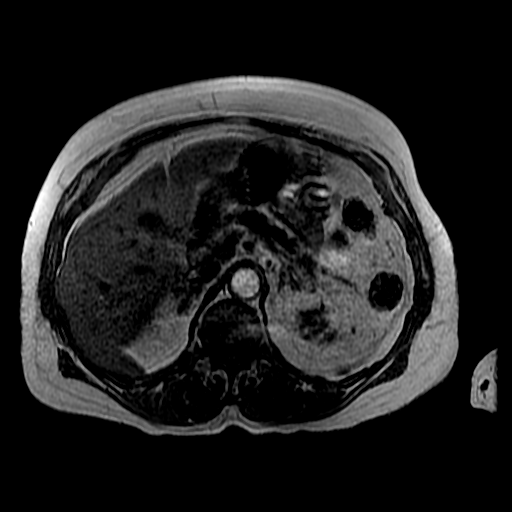
[im 85/106]
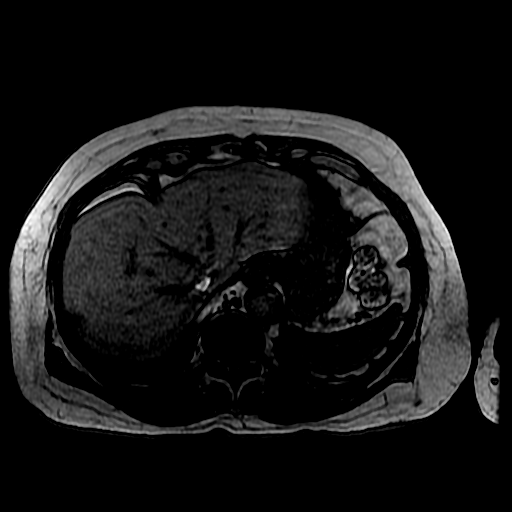
[im 106/106]
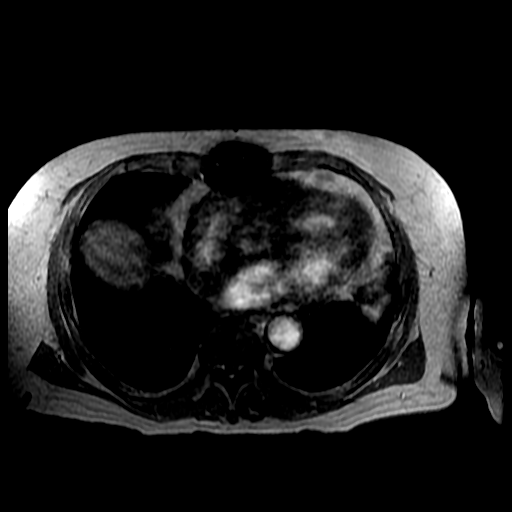

[Series 8: MRCP · coronal · 40.0mm · 0.70mm/px · 1 of 9 slices shown (2 of 2)]
[im 1/9]
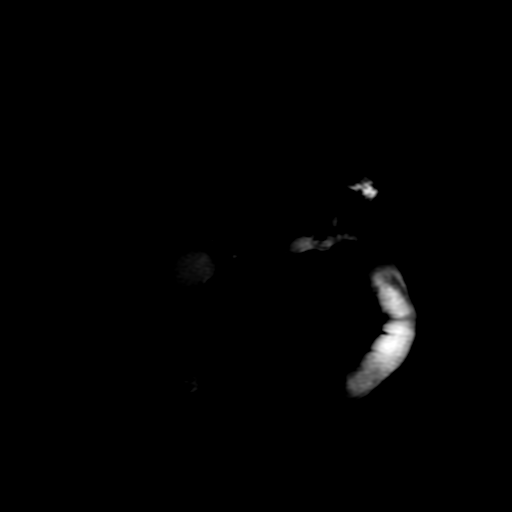

[Series 9: bSSFP fat-sat · coronal · 5.0mm · 0.70mm/px · 3 of 52 slices shown]
[im 1/52]
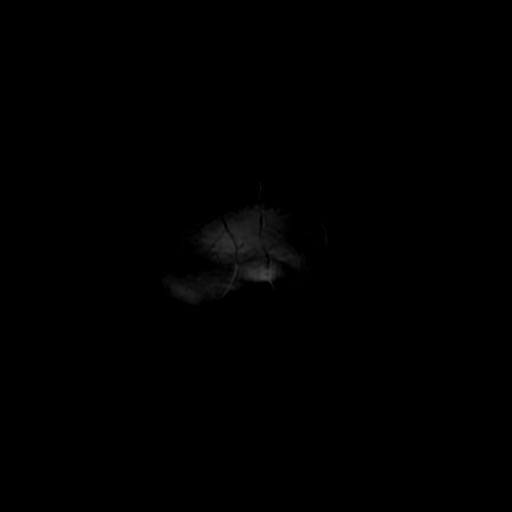
[im 26/52]
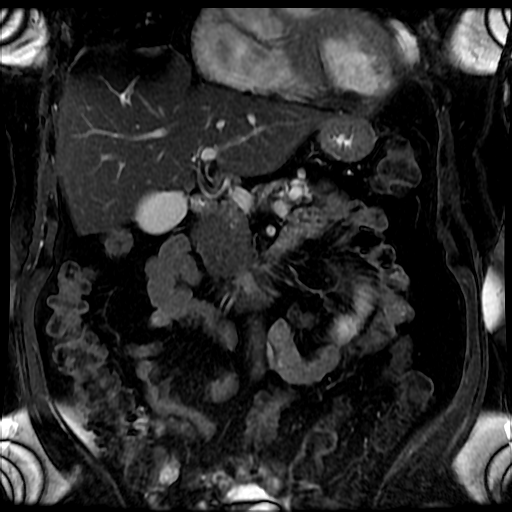
[im 52/52]
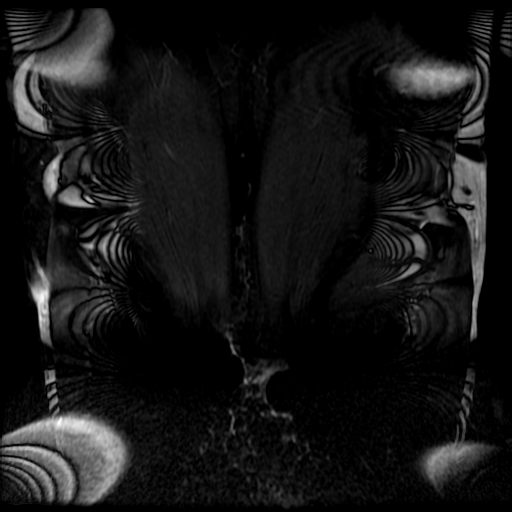

[Series 10: T2 · axial · 5.0mm · 0.78mm/px · z∈[-113,+147]mm · 3 of 53 slices shown]
[im 1/53]
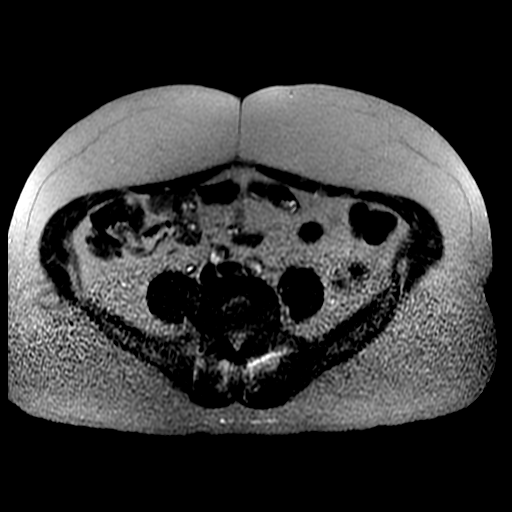
[im 27/53]
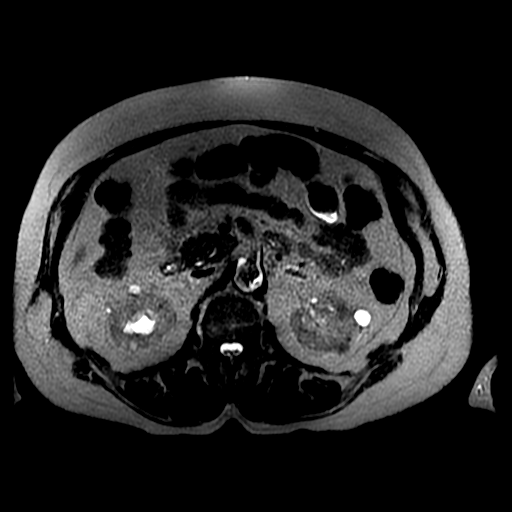
[im 53/53]
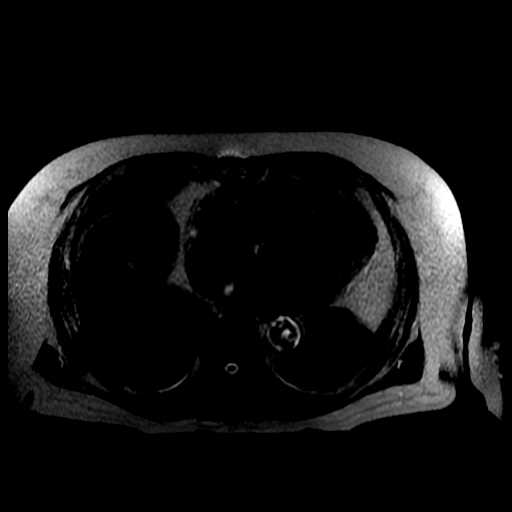

[21 of 48 positions shown; findings below may reference images not displayed]

FINDINGS: Lower chest: Cardiomegaly. Susceptibility artifact in the region of
the sternum, presumably from median sternotomy wires.

Hepatobiliary: No cystic or solid hepatic lesions. MRCP images
demonstrate no intra or extrahepatic biliary ductal dilatation.
Common bile duct measures 3 mm in the porta hepatis.

Pancreas: MRCP images demonstrate pancreatic ductal dilatation
measuring up to 15 mm in the body of the pancreas, increased
compared to the prior examination. This ductal dilatation abruptly
terminates in the region of the head of the pancreas where there is
an ill-defined area of soft tissue fullness estimated to measure
approximately 2.5 cm in diameter (axial image 22 of series 10) which
is poorly demonstrated on today's noncontrast examination, but
corresponds to the subtly enhancing lesion seen on prior MRI
07/28/2016. No peripancreatic inflammatory changes or fluid
collections.

Spleen:  Unremarkable.

Adrenals/Urinary Tract: Multiple small renal lesions are again noted
throughout both kidneys, and generally T1 hypointense and T2
hyperintense, incompletely characterized on today's noncontrast
examination but presumably small cysts. In addition, there is a
small exophytic 1.1 cm T1 hyperintense T2 hypointense lesion
extending from the posterior aspect of the upper pole of the left
kidney, presumably a proteinaceous/hemorrhagic cyst. No
hydroureteronephrosis in the visualized portions of the abdomen.

Stomach/Bowel: Visualized portions are unremarkable.

Vascular/Lymphatic: Aortic atherosclerosis without definite aneurysm
in the abdominal vasculature. No lymphadenopathy noted in the
abdomen.

Other: No significant volume of ascites noted the visualized
portions of the peritoneal cavity.

Musculoskeletal: No aggressive appearing osseous lesions are noted
in the visualized portions of the skeleton.
IMPRESSION: 1. Persistent small mass-like area of fullness in the pancreatic
head, grossly stable in size on today's noncontrast examination
measuring approximately 2.5 cm, with persistent pancreatic ductal
dilatation distal to this lesion. Findings remain concerning for
underlying neoplasm. No lymphadenopathy or definite signs of
metastatic disease in the abdomen on today's noncontrast
examination.

## 2019-08-26 IMAGING — CT CT ABDOMEN W/O CM
2 of 4 series · 14 of 46 positions shown, 16 images · non-contrast
Comparison: Multiple exams, including pancreatic protocol MRI from
11/25/2016

CLINICAL DATA: Pancreatic cancer status post SBRT. Assessment for
response to therapy.

EXAM:
CT ABDOMEN WITHOUT CONTRAST
TECHNIQUE: Multidetector CT imaging of the abdomen was performed following the
standard protocol without IV contrast. Contrast could not be
administered due to renal insufficiency.

[Series 2: axial st · axial · 0.85mm/px · z∈[-274,-44]mm · 11 of 56 slices shown, 13 images]
[im 5/56  soft-tissue]
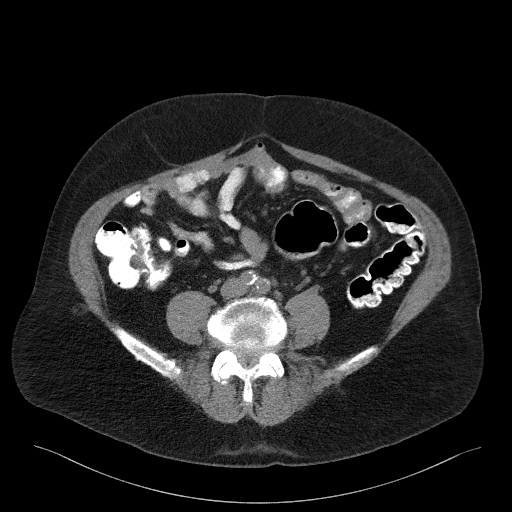
[im 5/56  bone]
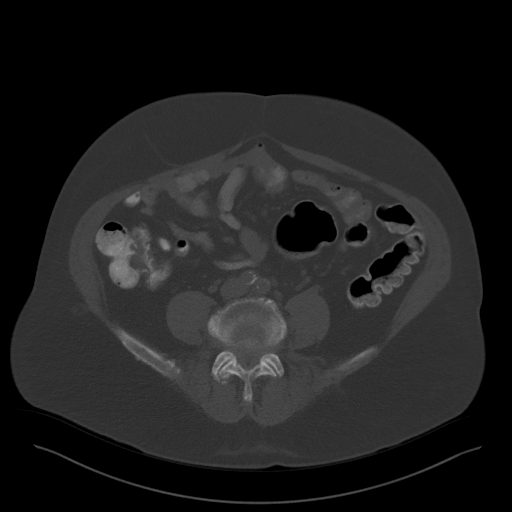
[im 9/56  soft-tissue]
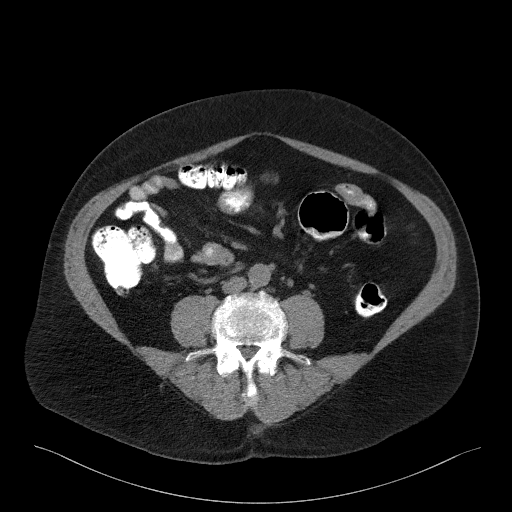
[im 13/56  soft-tissue]
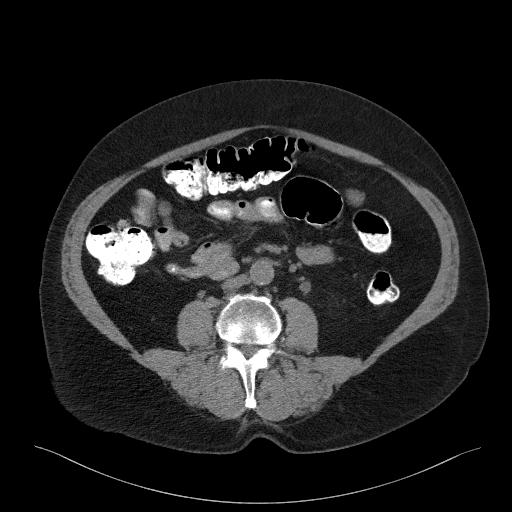
[im 17/56  soft-tissue]
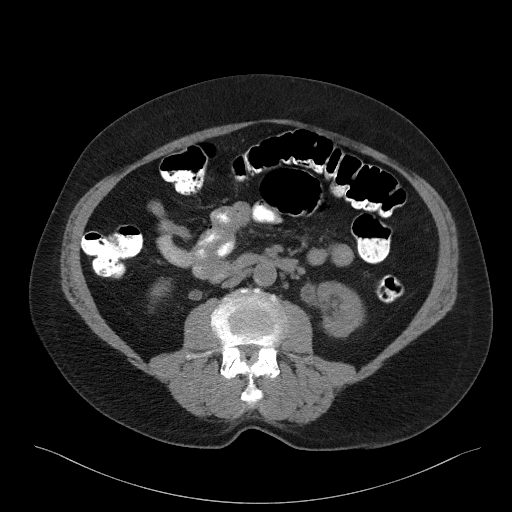
[im 22/56  soft-tissue]
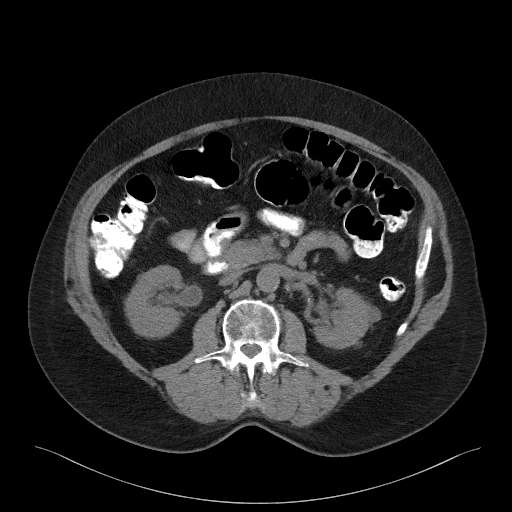
[im 30/56  soft-tissue]
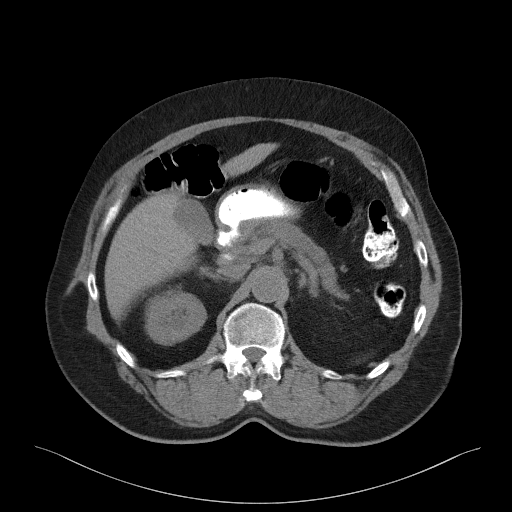
[im 34/56  soft-tissue]
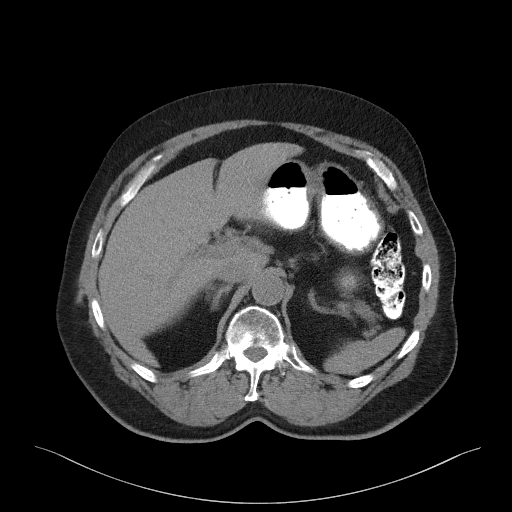
[im 39/56  soft-tissue]
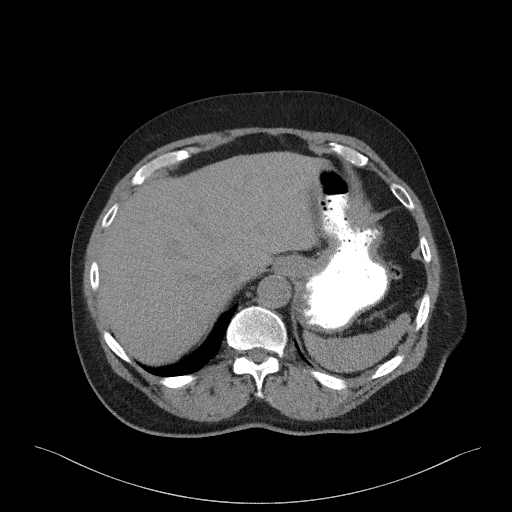
[im 43/56  soft-tissue]
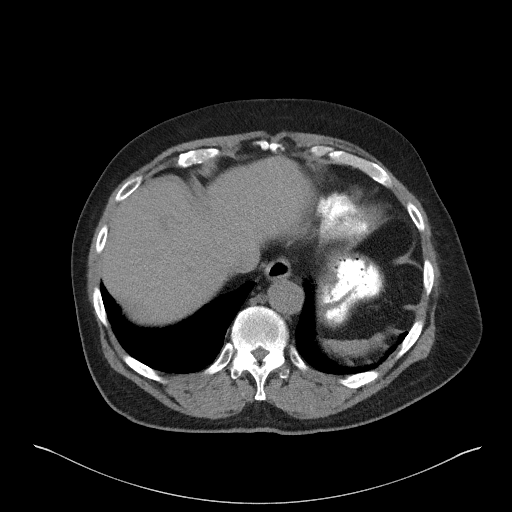
[im 43/56  bone]
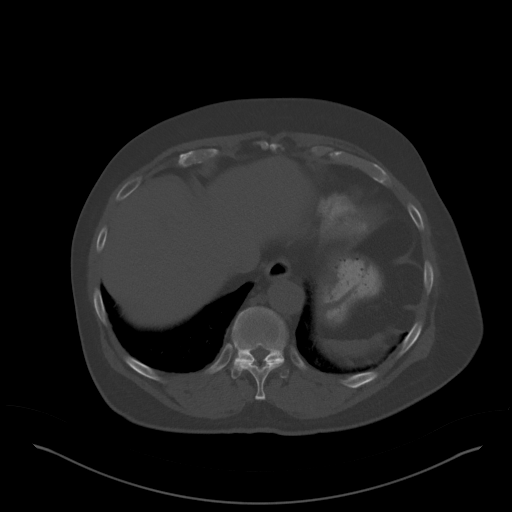
[im 47/56  soft-tissue]
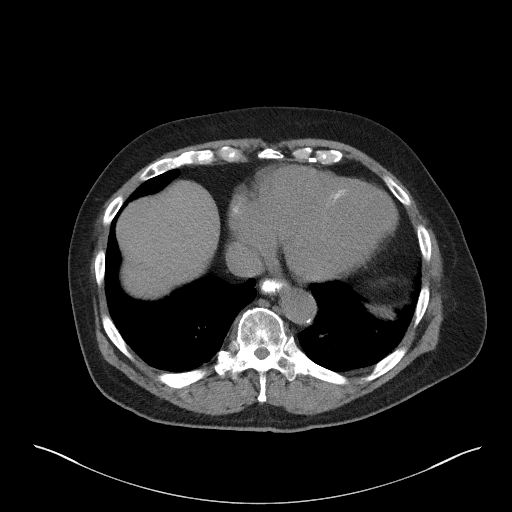
[im 51/56  soft-tissue]
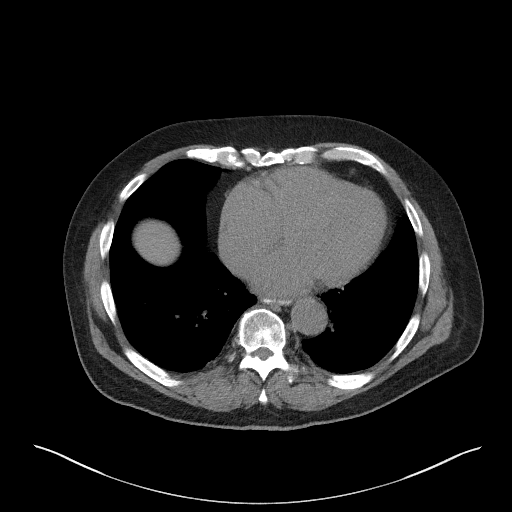

[Series 5: coronal st · coronal · 0.59mm/px · 3 of 113 slices shown]
[im 38/113  soft-tissue]
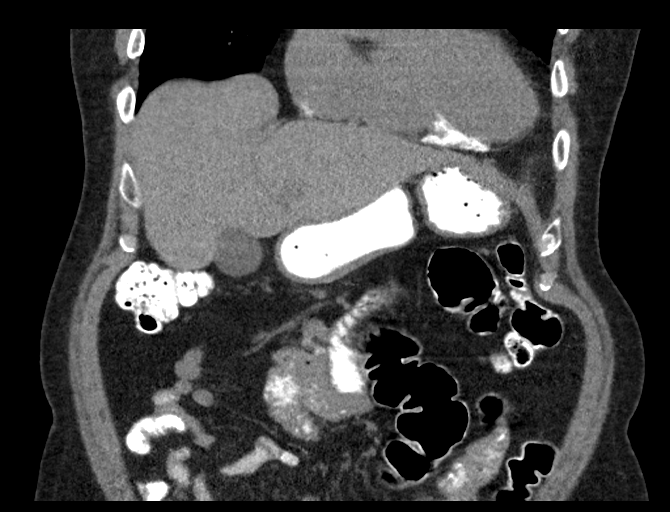
[im 50/113  soft-tissue]
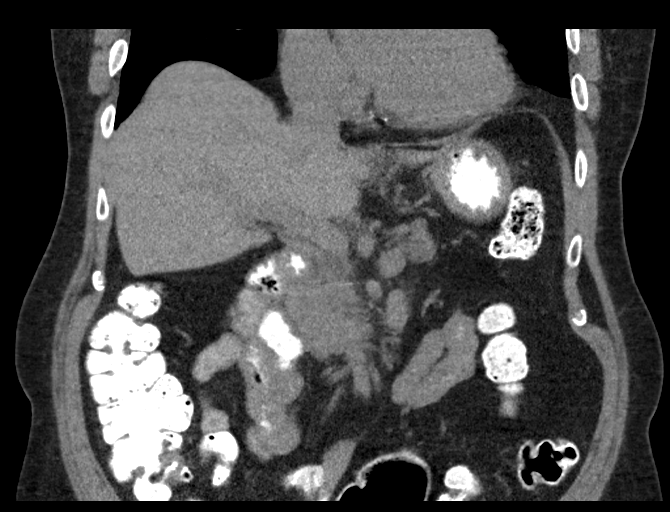
[im 63/113  soft-tissue]
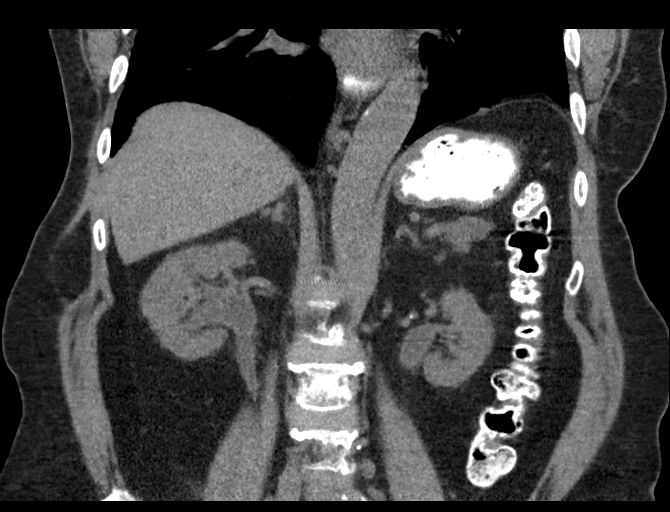

[14 of 46 positions shown; findings below may reference images not displayed]

FINDINGS: Lower chest: Coronary atherosclerosis. Contrast medium in the distal
esophagus. Calcification along the inferior wall myocardium of the
left ventricle and also along the pericardium. Mild cardiomegaly.
Prior median sternotomy.

Hepatobiliary: No discrete metastatic lesion. Sensitivity adversely
affected by the lack of IV contrast.

Pancreas: Smooth contour of the pancreatic tail with some internal
low density suggesting dorsal pancreatic duct dilatation and
potentially some effacement of the normal fatty stapling along the
pancreatic parenchyma.

In the vicinity of the known pancreatic mass there are 1-2 small
metal densities compatible with fiducials or clips. These cause
surrounding streak artifact. The mass itself is not readily
differentiated from the surrounding normal parenchyma, but there is
clearly a transition in the pancreatic body between the lower
density dilated dorsal pancreatic duct and the more normal caliber
ductal system in the pancreatic head. I am unable to given that
accurate measurement of the mass. The proximal SMV the is adjacent
to the expected region of the mass without well-defined tissue
planes between the SMV and the mass. Pancreatic parenchymal
thickness in the vicinity of the mass is 1.8 cm on image [DATE].

Spleen: Unremarkable

Adrenals/Urinary Tract: Several exophytic renal lesions are present,
most of these are hypodense although a 1.1 cm exophytic left renal
lesion is clearly complex and had low T2 signal on the prior MRI.

There is continued mild diffuse wall thickening in the collecting
systems of both kidneys, as shown on the prior MRI.

Stomach/Bowel: Unremarkable

Vascular/Lymphatic: Aortoiliac atherosclerotic vascular disease. I
do not see tumor encasement of the celiac trunk or SMA. No
well-defined porta hepatis adenopathy.

Other: No supplemental non-categorized findings.

Musculoskeletal: There is a lipoma of the right external oblique
muscle as well as a lipoma deep to the left external oblique muscle.
Congenitally short pedicles in the lumbar spine causing multilevel
impingement.
IMPRESSION: 1. Fiducials in the vicinity of the prior pancreatic mass. There is
some continued low-density in the pancreatic tail with expected
dilatation of the dorsal pancreatic duct extending to the level of
the mass. The mass itself is difficult to differentiate from the
surrounding pancreatic parenchyma on today's noncontrast CT. The
thickness of the pancreatic contour in the vicinity of the mass is
1.8 cm on image [DATE] (not including the SMV), which appears stable
from the prior MRI. This is considered only a rough indicator of
stability of the size of the mass. The mass may be closely
approximated to the SMV but the celiac trunk and superior mesenteric
artery do not appear involved at this time.
2. Other imaging findings of potential clinical significance: Aortic
Atherosclerosis ([id number]-GQ0.0). Coronary atherosclerosis.
Calcification along the inferior myocardial wall and pericardium.
Prior median sternotomy. Contrast medium in the distal esophagus
compatible with dysmotility or reflux. Bilateral renal cysts of
varying complexity. Continued diffuse wall thickening in the
collecting systems of both kidneys. Multilevel lumbar impingement.

## 2020-04-09 IMAGING — CT CT ABD-PELV W/O CM
2 of 4 series · 14 of 46 positions shown, 16 images · non-contrast
Comparison: None.

CLINICAL DATA: Abdominal pain and distention for several days.
History of pancreatic cancer status post SBRT.

EXAM:
CT ABDOMEN AND PELVIS WITHOUT CONTRAST
TECHNIQUE: Multidetector CT imaging of the abdomen and pelvis was performed
following the standard protocol without IV contrast.

[Series 2: axial st · axial · 0.75mm/px · z∈[-482,-62]mm · 11 of 98 slices shown, 13 images]
[im 7/98  soft-tissue]
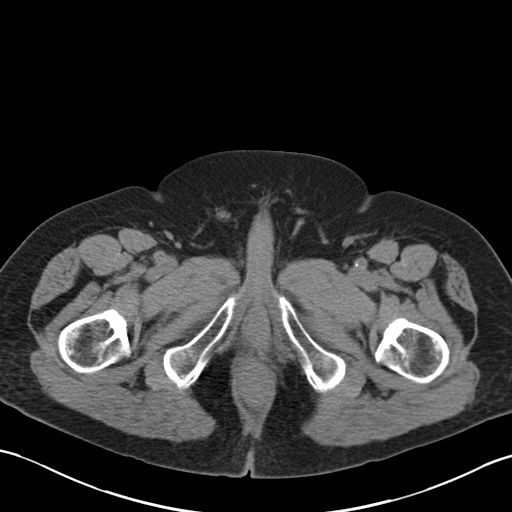
[im 7/98  bone]
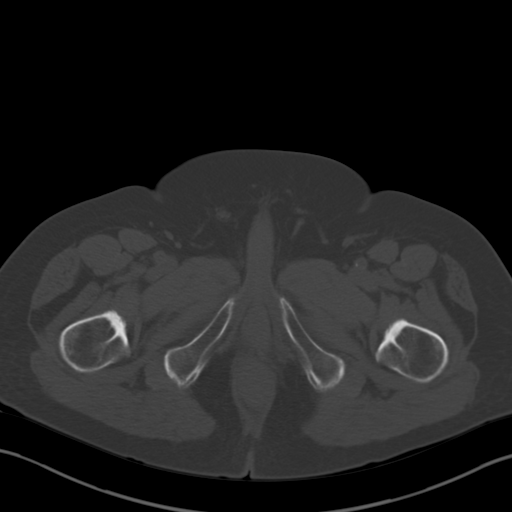
[im 19/98  soft-tissue]
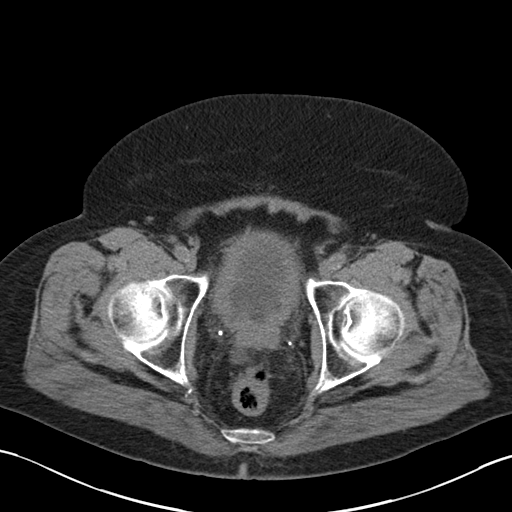
[im 25/98  soft-tissue]
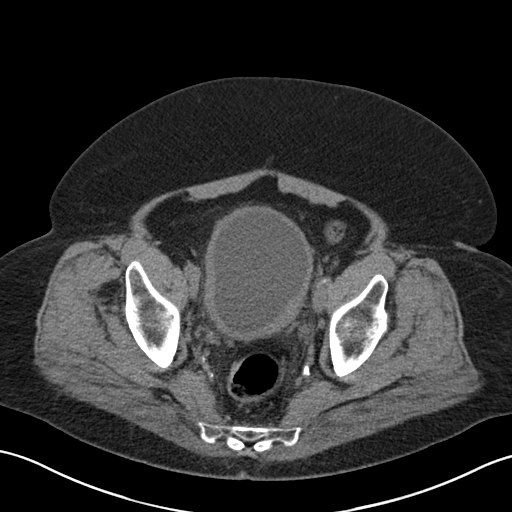
[im 31/98  soft-tissue]
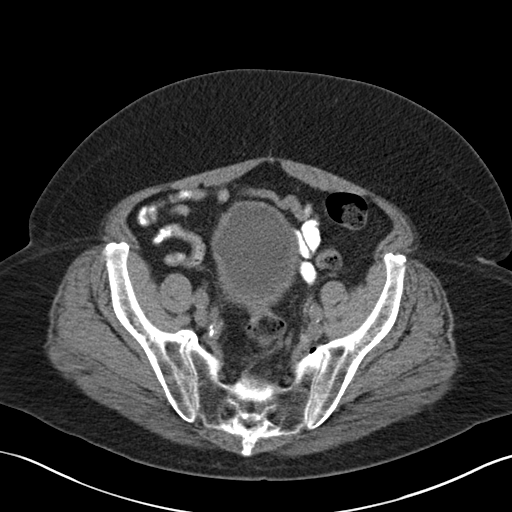
[im 43/98  soft-tissue]
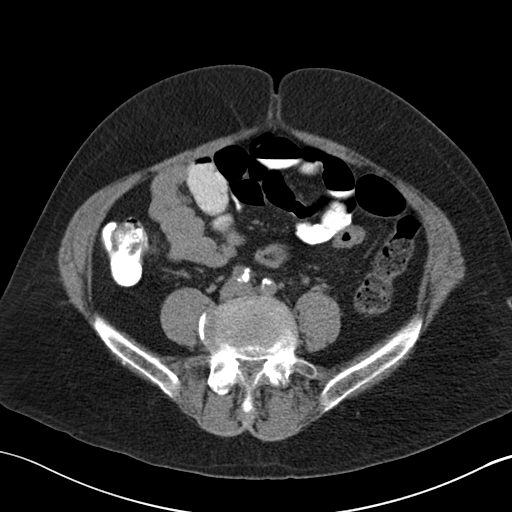
[im 49/98  soft-tissue]
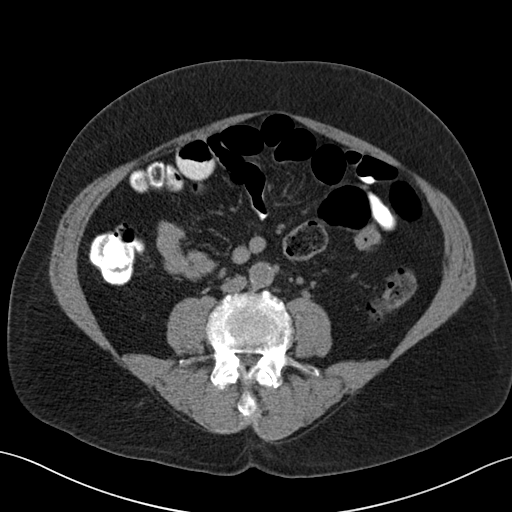
[im 55/98  soft-tissue]
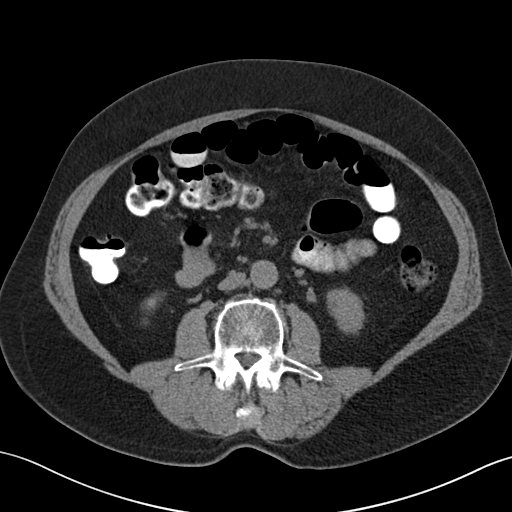
[im 67/98  soft-tissue]
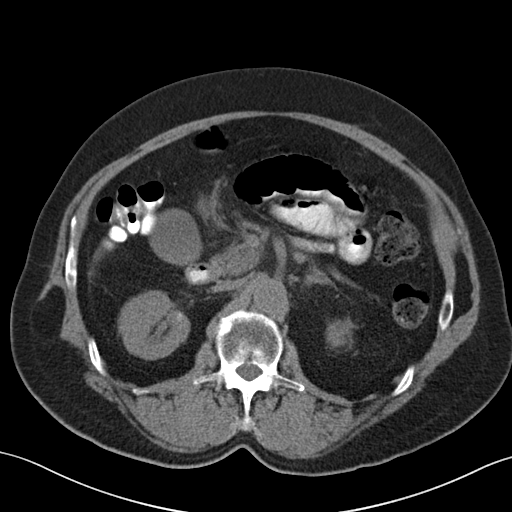
[im 73/98  soft-tissue]
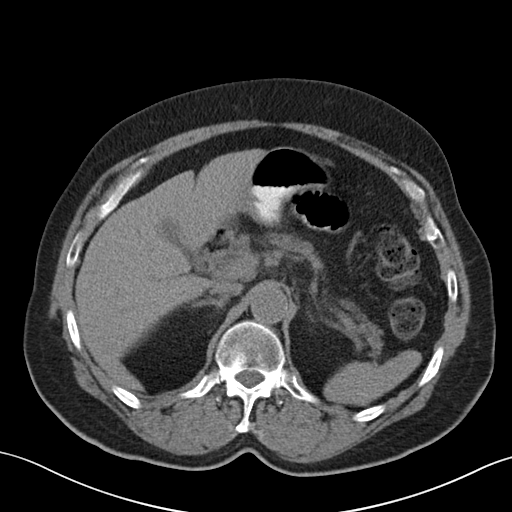
[im 73/98  bone]
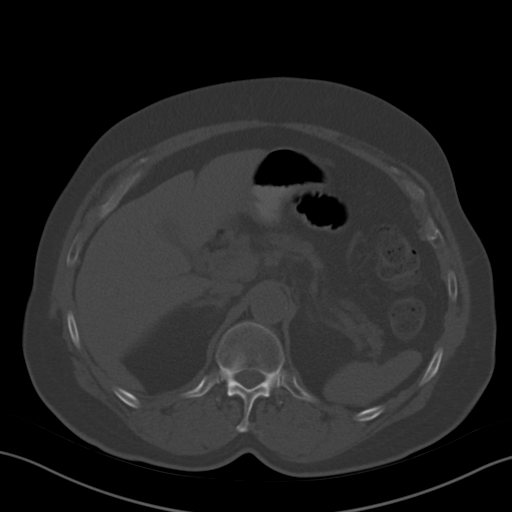
[im 79/98  soft-tissue]
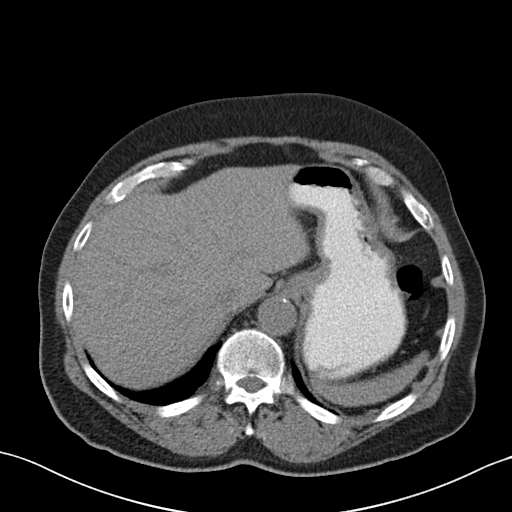
[im 91/98  soft-tissue]
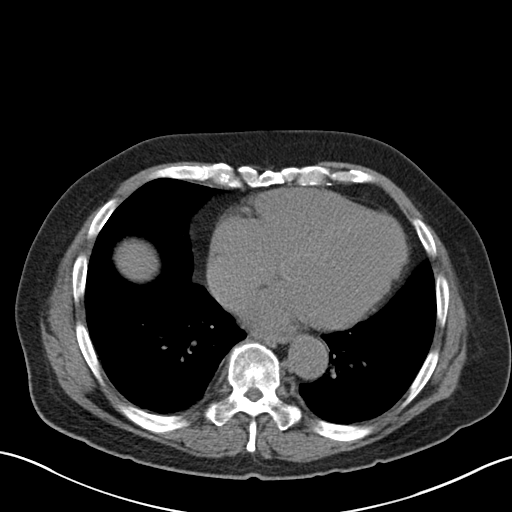

[Series 4: coronal st · coronal · 0.69mm/px · 3 of 91 slices shown]
[im 31/91  soft-tissue]
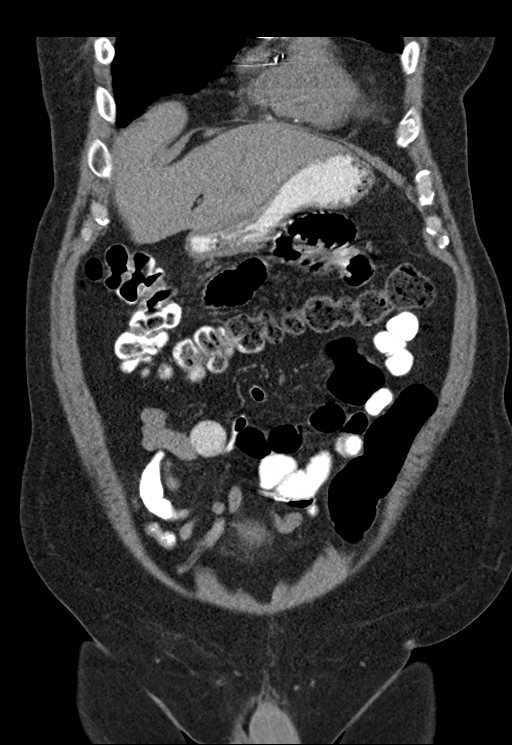
[im 41/91  soft-tissue]
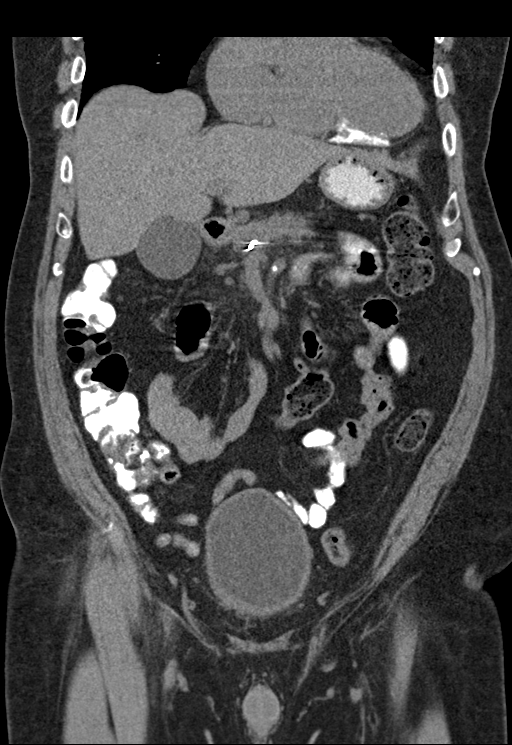
[im 51/91  soft-tissue]
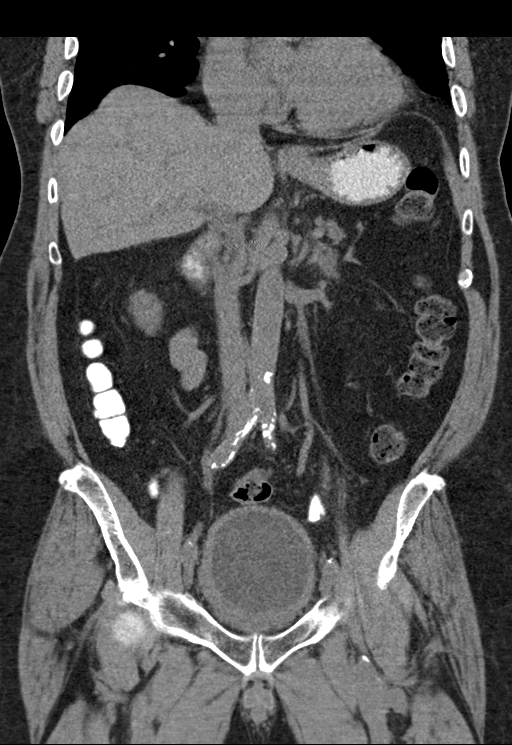

[14 of 46 positions shown; findings below may reference images not displayed]

FINDINGS: Lower chest: Calcification along the inferior aspect of the left
ventricle compatible with chronic left ventricular aneurysm. Heart
size is enlarged but stable. Scattered left main and three-vessel
coronary arteriosclerosis. Median sternotomy sutures are place.

Hepatobiliary: The unenhanced liver is unremarkable. Layering
biliary sludge noted within the gallbladder.

Pancreas: Surgical clips project about the pancreatic head as
before. Pancreas is atrophic in appearance with probable ectasia of
the pancreatic duct. No calculus noted. No significant progression
in size of pancreatic head mass though assessment is limited due to
lack of IV contrast.

Spleen: Normal

Adrenals/Urinary Tract: Small simple and complex renal lesions are
identified some the complex lesions hyperdense in appearance similar
to prior compatible with hemorrhagic proteinaceous cysts. Largest
cyst is in the interpolar aspect of the right kidney within the
cortex measuring approximately 1.7 cm. No nephrolithiasis nor
hydroureteronephrosis. Chronic thick-walled appearance of both renal
collecting systems and urinary bladder query stigmata of chronic
urinary tract infection.

Stomach/Bowel: Small hiatal hernia. Contrast distended stomach with
normal antegrade flow of contrast into the small intestine. No
obstruction to the antegrade flow of contrast into the colon is
identified. The appendix is contrast filled and normal. Moderate
amount of stool is seen within the colon to the level of the rectum.
No large bowel obstruction or inflammation.

Vascular/Lymphatic: Moderate aortoiliac atherosclerosis. No
lymphadenopathy.

Reproductive: Normal size prostate surgical clips noted within.

Other: Small amount free fluid in the pelvis.

Musculoskeletal: Lipomas of the external oblique muscles
bilaterally. No acute nor aggressive osseous lesions. Short pedicles
of the lumbar spine as before.
IMPRESSION: 1. Calcification along the inferior wall of the left ventricle
redemonstrated possibly associated with a calcified left ventricular
aneurysm.
2. Given lack of IV contrast, the reported pancreatic head mass is
difficult to visualize but no significant progression is
demonstrated. Probable mild ectasia of the pancreatic duct
accounting for subtle areas of hypodensity within the atrophic body
and tail.
3. Fiduciary clips are identified in the region of the pancreatic
head as well as in the prostate.
4. Chronic urothelial thickening of both renal collecting systems
and urinary bladder suspicious for changes of chronic urinary tract
infection.
5. Redemonstration of bilateral renal cysts some which are simple
and others more complex and hyperdense as before. No significant
progression identified.
6. No acute bowel obstruction or inflammation. Normal appendix.
Moderate stool retention within the colon query constipation.
7. Other ancillary findings as above.

## 2020-07-01 IMAGING — CT CT ABD-PELV W/O CM
2 of 4 series · 16 of 46 positions shown, 18 images · non-contrast
Comparison: Abdominopelvic CT 10/05/2017.

CLINICAL DATA: Low abdominal pain with bloating for 2 days. No
appetite. History of pancreatic cancer. Elevated liver function
studies.

EXAM:
CT ABDOMEN AND PELVIS WITHOUT CONTRAST
TECHNIQUE: Multidetector CT imaging of the abdomen and pelvis was performed
following the standard protocol without IV contrast.

[Series 2: axial st · axial · 0.85mm/px · z∈[+1135,+1545]mm · 13 of 94 slices shown, 15 images]
[im 6/94  soft-tissue]
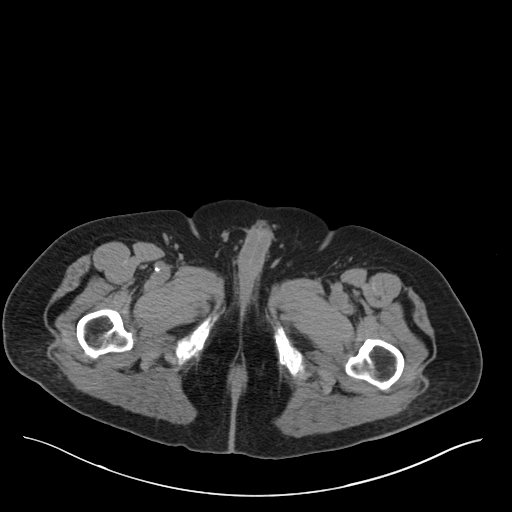
[im 6/94  bone]
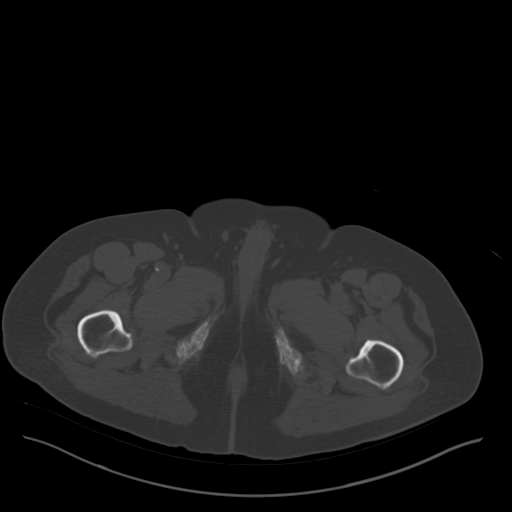
[im 11/94  soft-tissue]
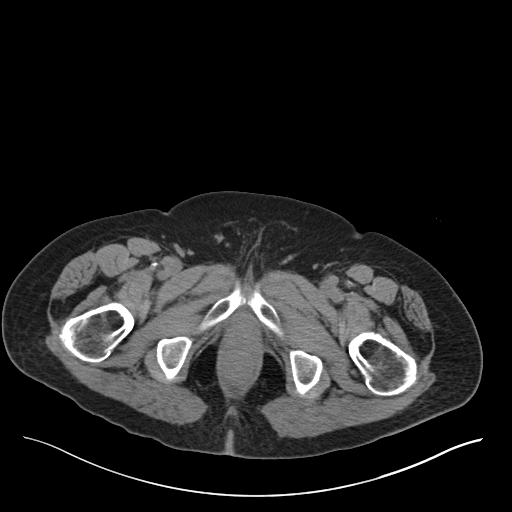
[im 21/94  soft-tissue]
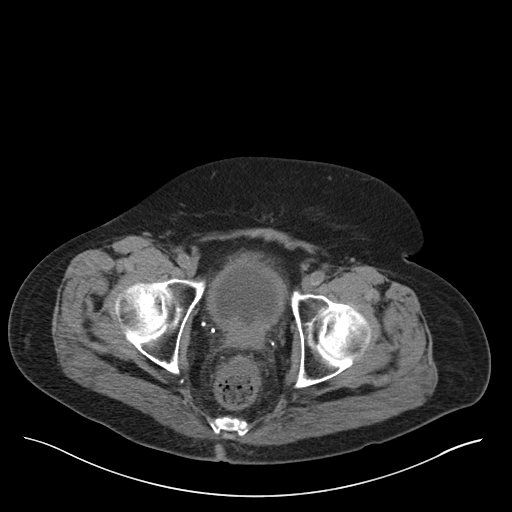
[im 26/94  soft-tissue]
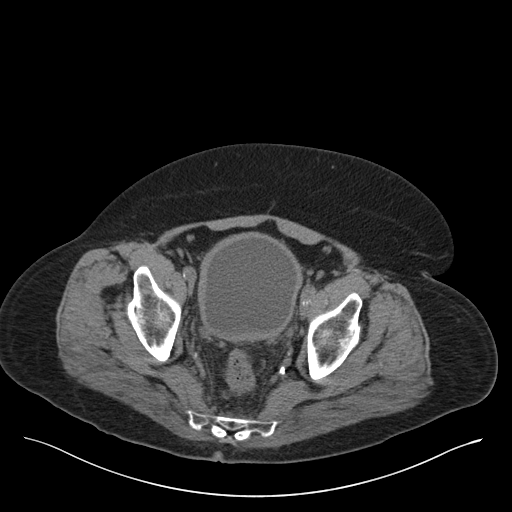
[im 32/94  soft-tissue]
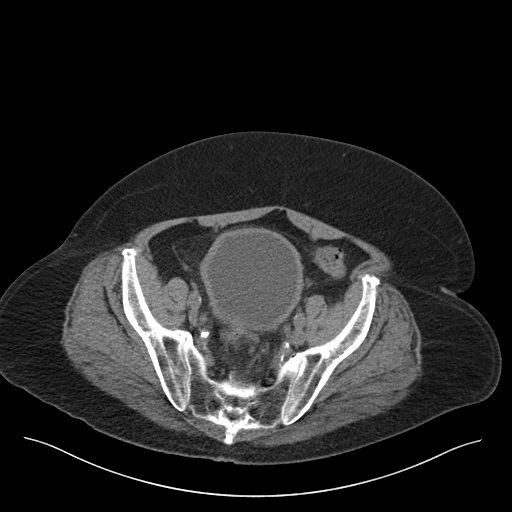
[im 42/94  soft-tissue]
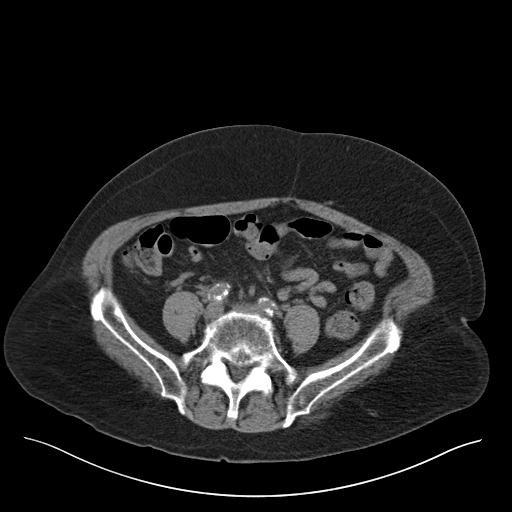
[im 47/94  soft-tissue]
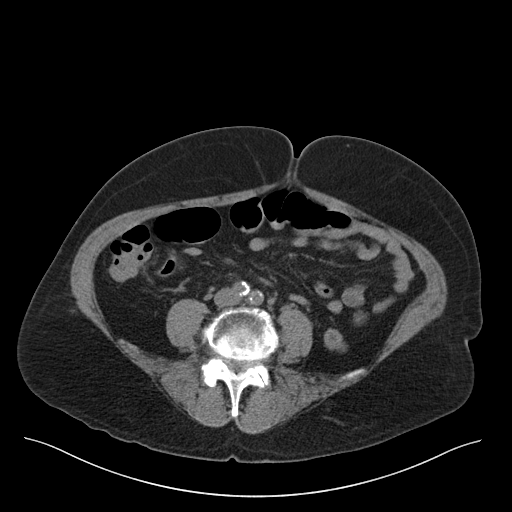
[im 52/94  soft-tissue]
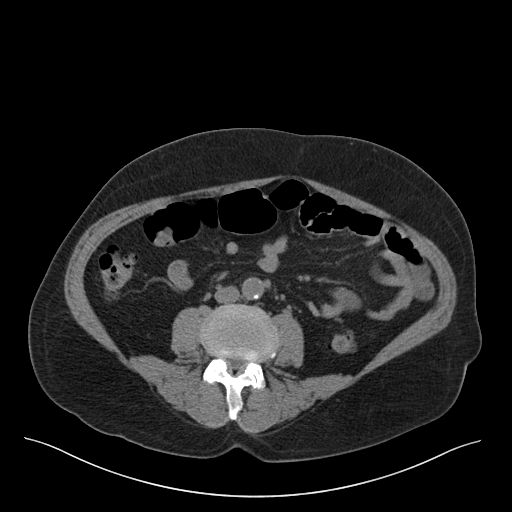
[im 63/94  soft-tissue]
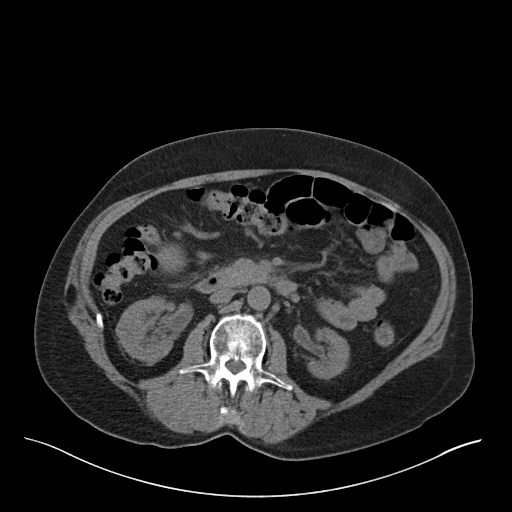
[im 63/94  bone]
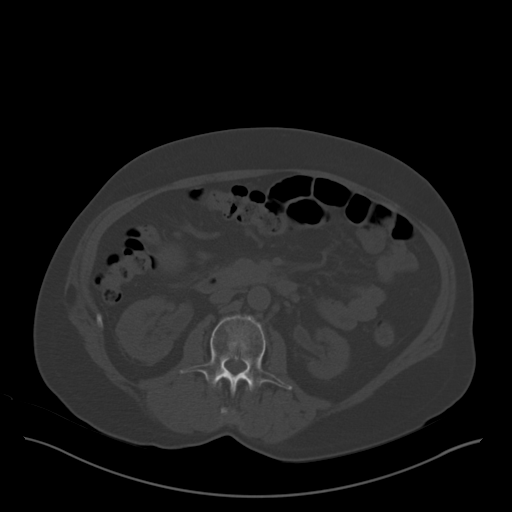
[im 68/94  soft-tissue]
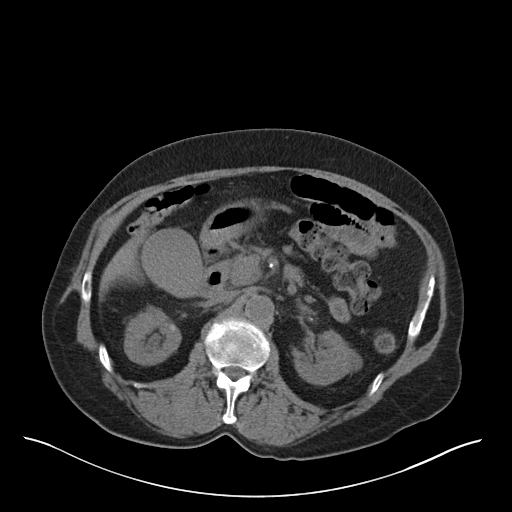
[im 73/94  soft-tissue]
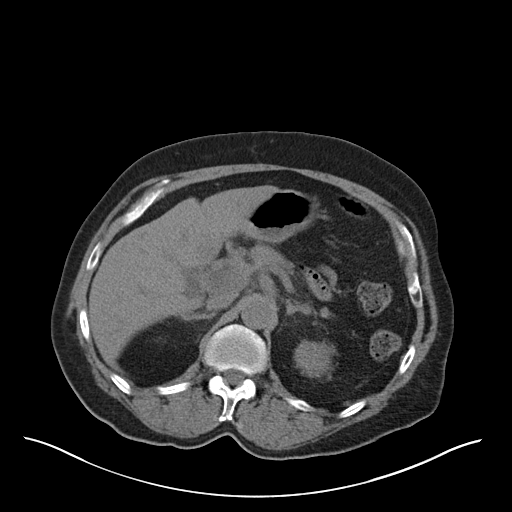
[im 83/94  soft-tissue]
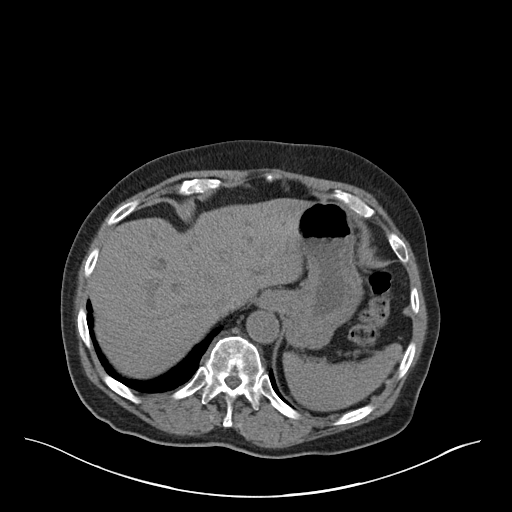
[im 88/94  soft-tissue]
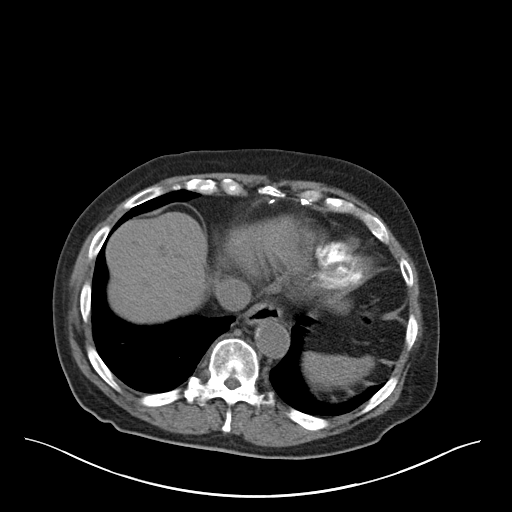

[Series 5: coronal st · coronal · 0.89mm/px · 3 of 95 slices shown]
[im 32/95  soft-tissue]
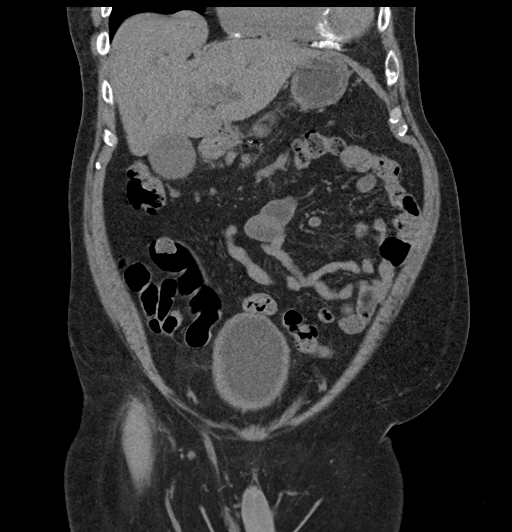
[im 42/95  soft-tissue]
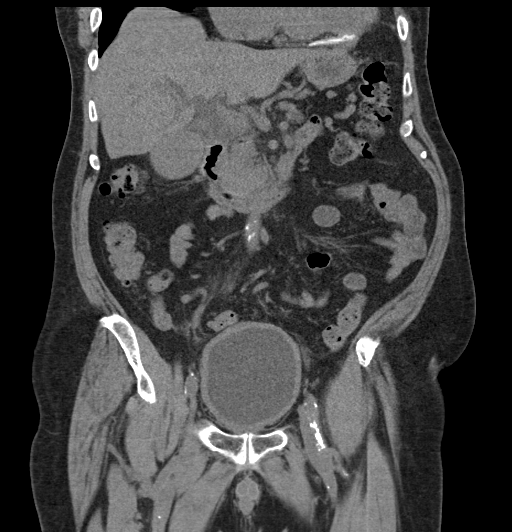
[im 53/95  soft-tissue]
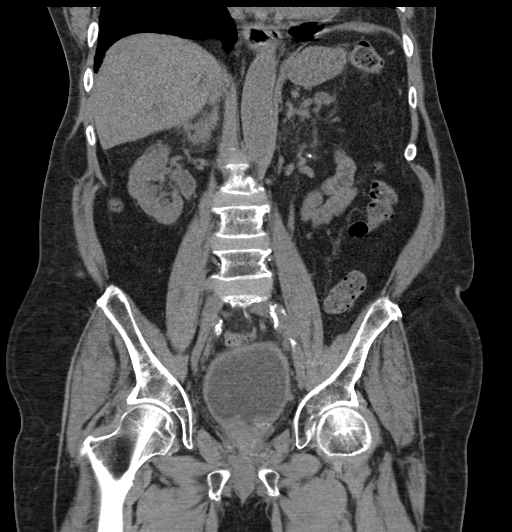

[16 of 46 positions shown; findings below may reference images not displayed]

FINDINGS: Lower chest: Stable mild chronic central airway thickening in both
lung bases. The heart is enlarged with a chronic calcified aneurysm
involving inferior wall left ventricle. No significant pleural or
pericardial effusion.

Hepatobiliary: No focal hepatic lesions are identified on
noncontrast imaging. However, there is new intrahepatic biliary
dilatation with increased extrahepatic biliary dilatation. The
gallbladder appears unremarkable.

Pancreas: Ill-defined enlargement of the pancreatic head is grossly
stable, suboptimally evaluated on this noncontrast study. There is a
stable surgical clip or fiduciary marker at the pancreatic neck.
There is stable pancreatic atrophy without significant ductal
dilatation or surrounding inflammatory change.

Spleen: Normal in size without focal abnormality.

Adrenals/Urinary Tract: Both adrenal glands appear normal. The
kidneys appears similar to the previous study. There is bilateral
renal cortical thinning with low-density and hyperdense lesions
bilaterally. There is mild chronic collecting system dilatation and
wall thickening. There is moderate diffuse bladder wall thickening.

Stomach/Bowel: No evidence of bowel wall thickening, distention or
surrounding inflammatory change. The appendix appears normal.

Vascular/Lymphatic: There is increased low-density in the porta
hepatis, difficult to differentiate from the portal vein and bile
duct on this noncontrast study, although suspicious for worsening
portacaval adenopathy given the worsening biliary dilatation. This
measures up to 16 mm short axis on image 22. There are no enlarged
retroperitoneal or pelvic lymph nodes. Diffuse aortic and branch
vessel atherosclerosis.

Reproductive: Radiation clips in the prostate gland and mild
enlargement of the gland are stable.

Other: No ascites or peritoneal nodularity.

Musculoskeletal: No acute or significant osseous findings. Chronic
biconcave deformities in the lower lumbar spine are stable. No
evidence of osseous metastatic disease. Stable lipomas in the
external oblique muscles of the abdominal wall bilaterally.
IMPRESSION: 1. New intrahepatic and progressive extrahepatic biliary dilatation
consistent with obstructive jaundice from local recurrence of
pancreatic cancer or nodal adenopathy in the porta hepatis. Biliary
decompression recommended.
2. No hepatic metastases identified on noncontrast imaging. No
ascites or peritoneal nodularity.
3. Stable chronic mild renal collecting system dilatation and wall
thickening without secondary signs of acute obstruction. Chronic
bladder wall thickening.
4. Chronic calcified left ventricular aneurysm.

## 2020-07-01 IMAGING — US US ABDOMEN LIMITED
1 series · 14 of 25 positions shown · non-contrast
Comparison: None.

CLINICAL DATA: Right upper quadrant pain for 3 days.

EXAM:
ULTRASOUND ABDOMEN LIMITED RIGHT UPPER QUADRANT

[Series 1: us abdomen limited · 14 of 52 slices shown]
[im 1/52]
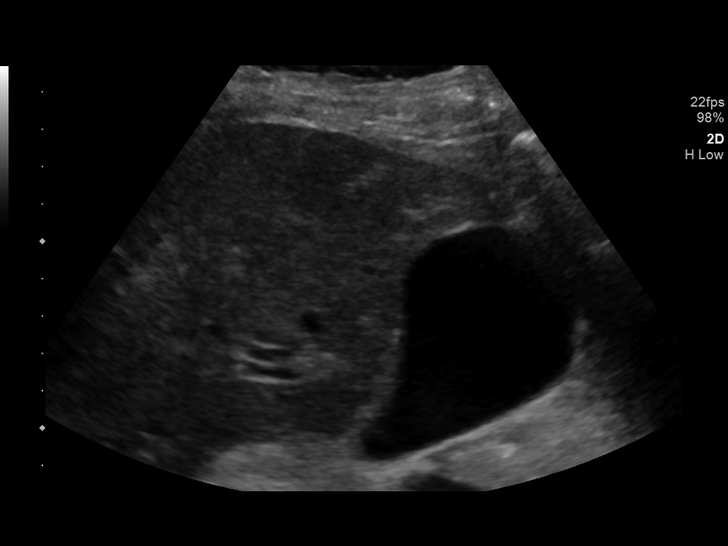
[im 5/52]
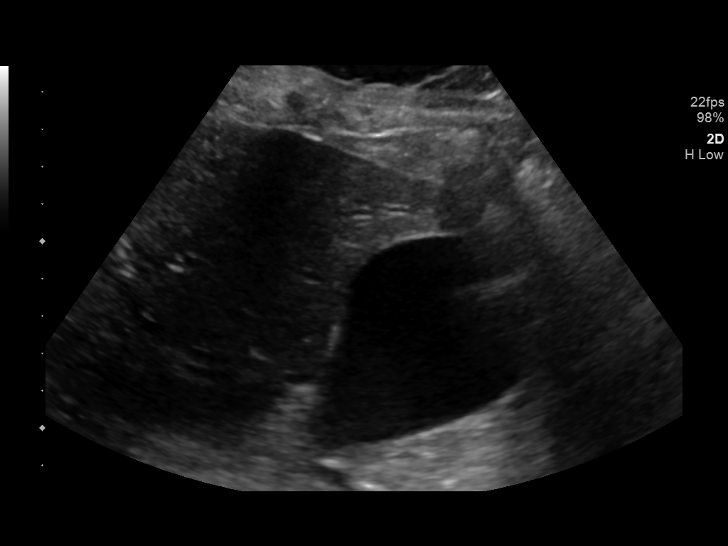
[im 9/52]
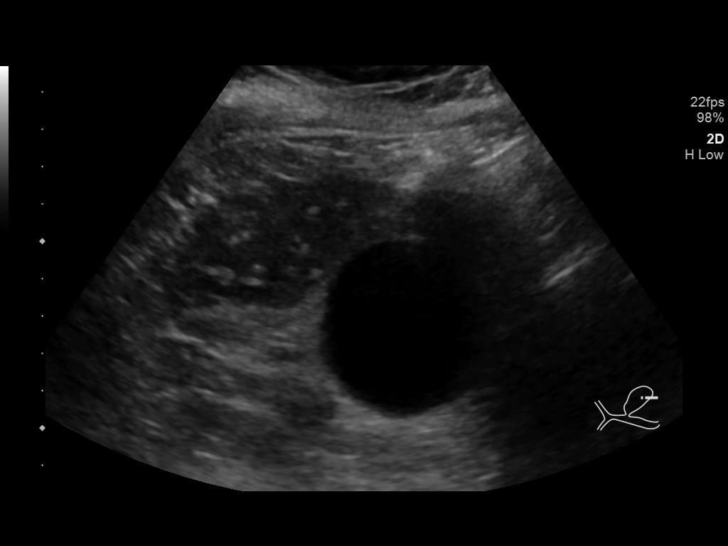
[im 13/52]
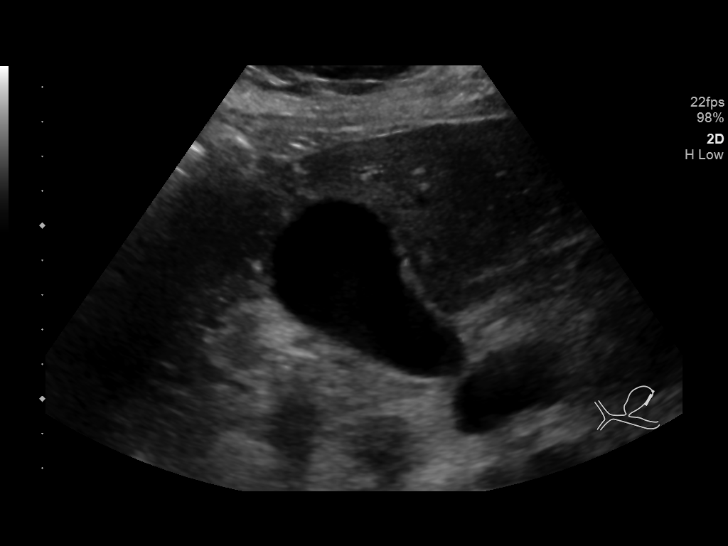
[im 18/52]
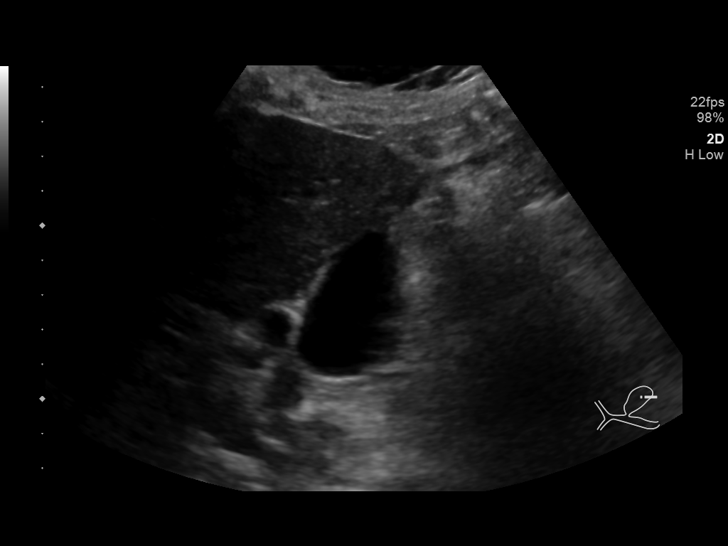
[im 20/52]
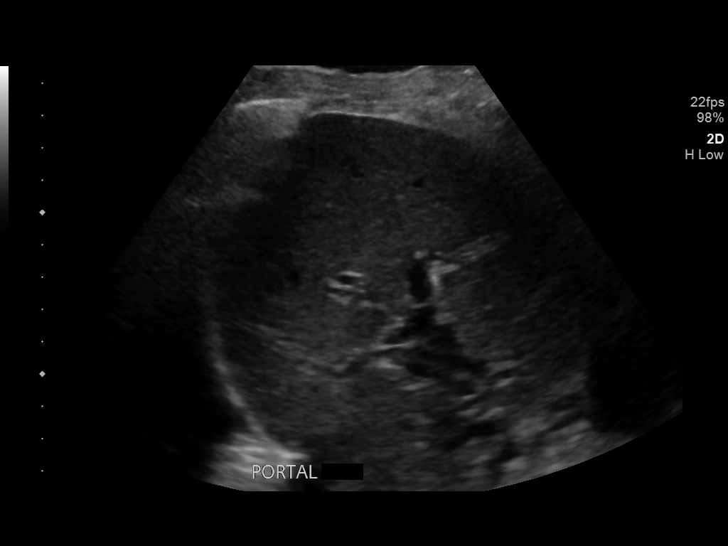
[im 24/52]
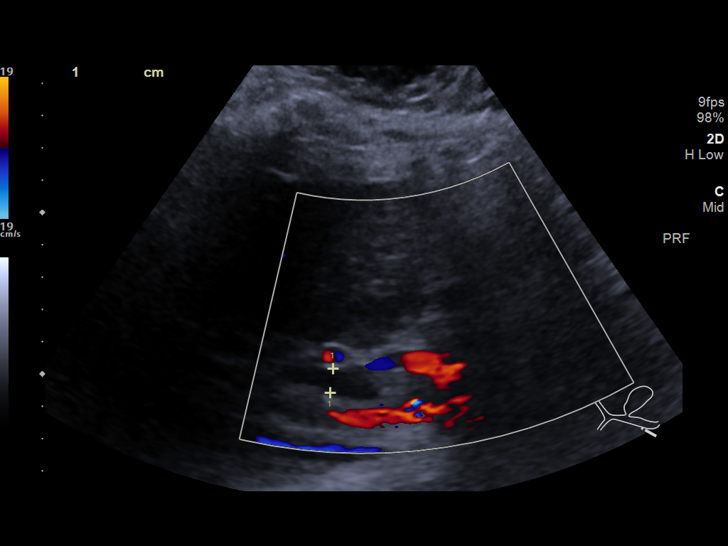
[im 28/52]
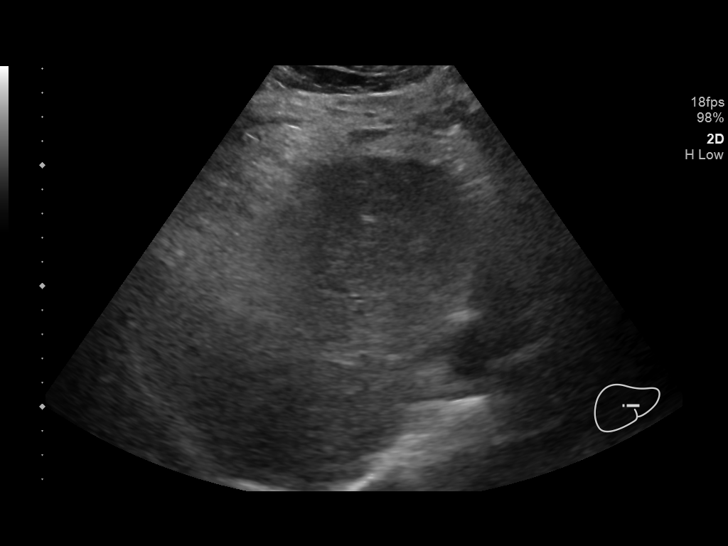
[im 32/52]
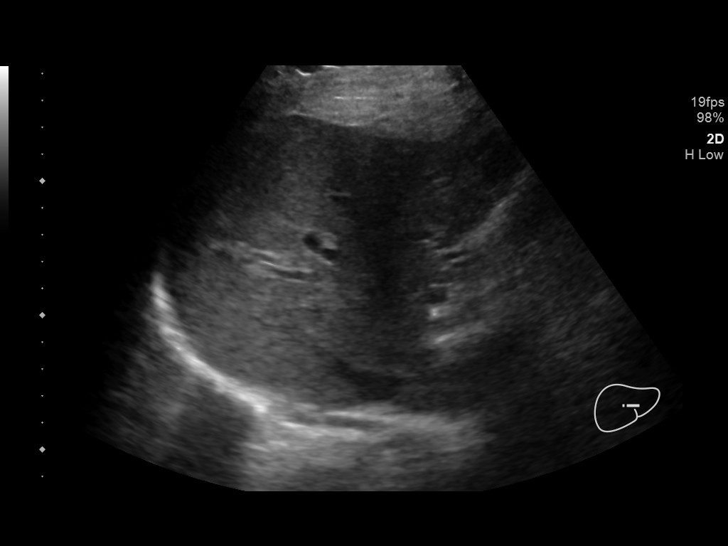
[im 35/52]
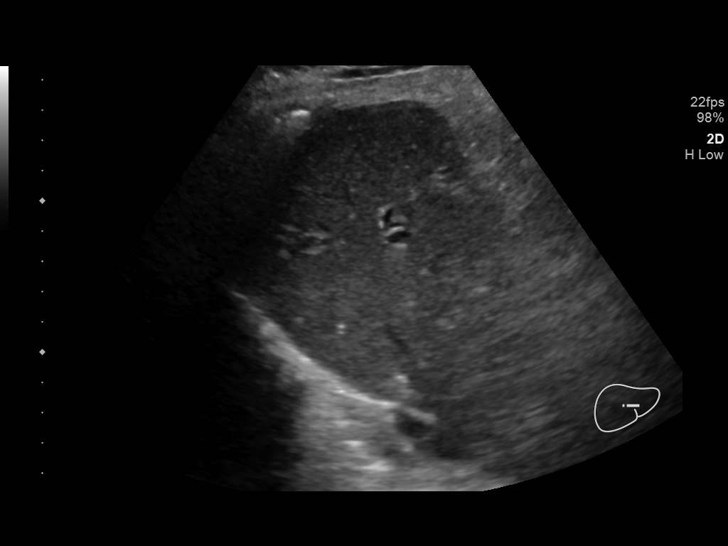
[im 39/52]
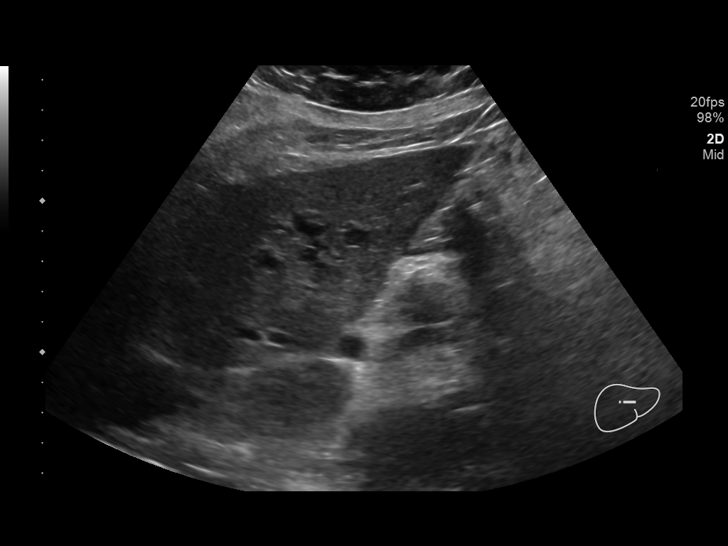
[im 43/52]
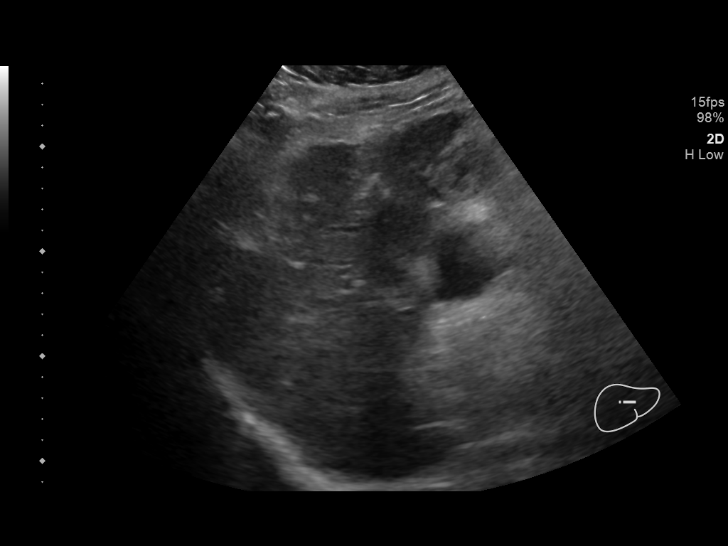
[im 47/52]
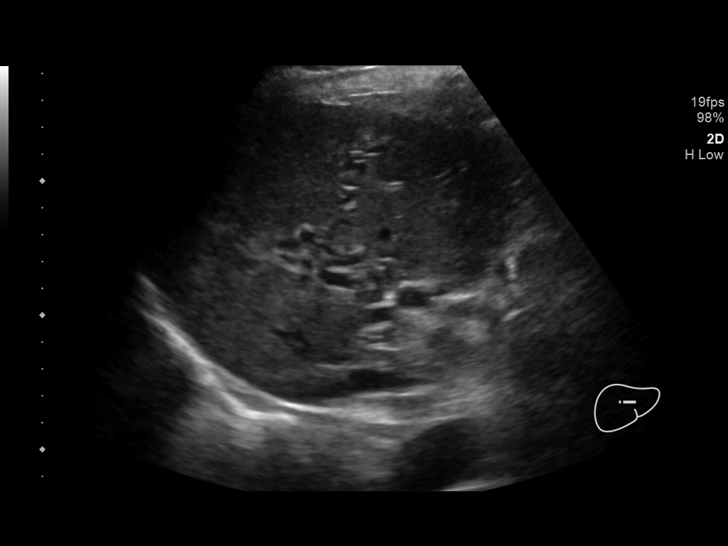
[im 52/52]
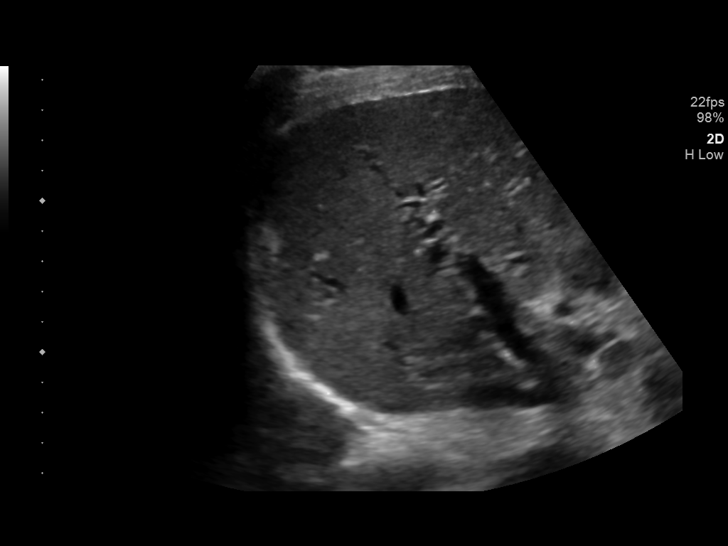

[14 of 25 positions shown; findings below may reference images not displayed]

FINDINGS: Gallbladder:

No gallstones or wall thickening visualized. No sonographic Murphy
sign noted by sonographer.

Common bile duct:

Diameter: 0.8 cm.

Liver:

No focal lesion identified. Within normal limits in parenchymal
echogenicity. Portal vein is patent on color Doppler imaging with
normal direction of blood flow towards the liver.
IMPRESSION: Negative exam.

## 2020-08-11 IMAGING — CT CT ABD-PELV W/O
2 of 4 series · 15 of 46 positions shown, 17 images · non-contrast
Comparison: None.

CLINICAL DATA: Pancreatic neoplasm follow-up. Abdominal pain.

EXAM:
CT ABDOMEN AND PELVIS WITHOUT CONTRAST
TECHNIQUE: Multidetector CT imaging of the abdomen and pelvis was performed
following the standard protocol without IV contrast.

[Series 3: abd/ pelvis 5.0 i30f 2 · axial · 0.93mm/px · z∈[-844,-419]mm · 12 of 97 slices shown, 14 images]
[im 8/97  soft-tissue]
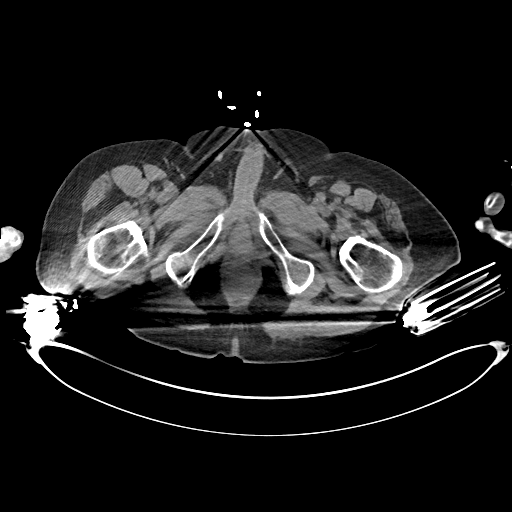
[im 8/97  bone]
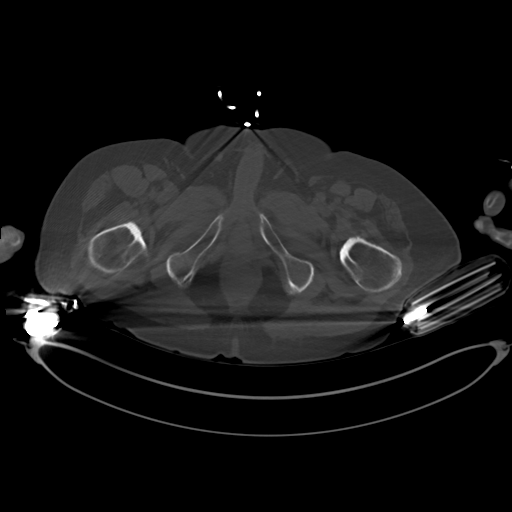
[im 16/97  soft-tissue]
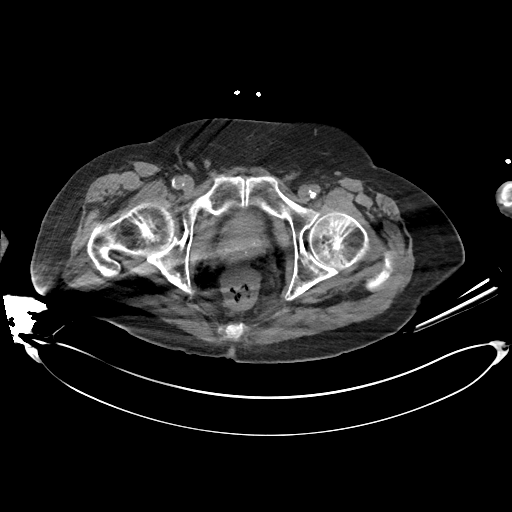
[im 24/97  soft-tissue]
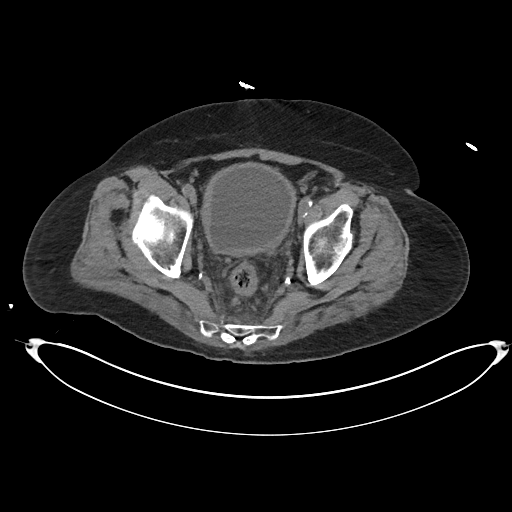
[im 31/97  soft-tissue]
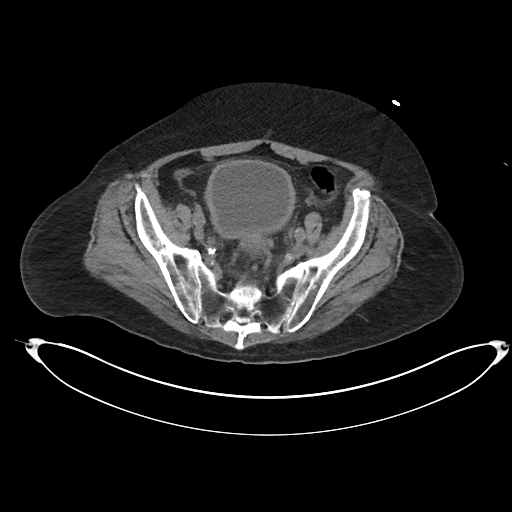
[im 39/97  soft-tissue]
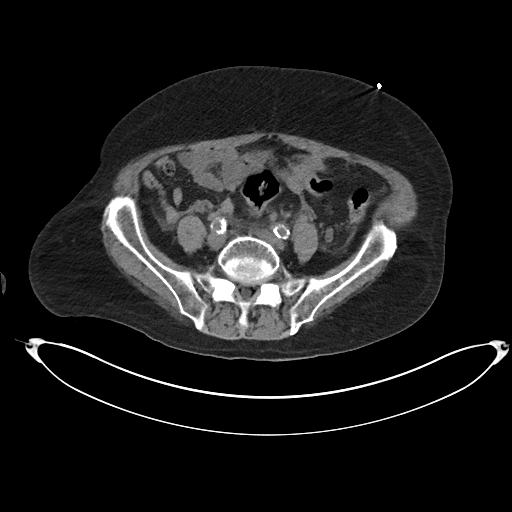
[im 47/97  soft-tissue]
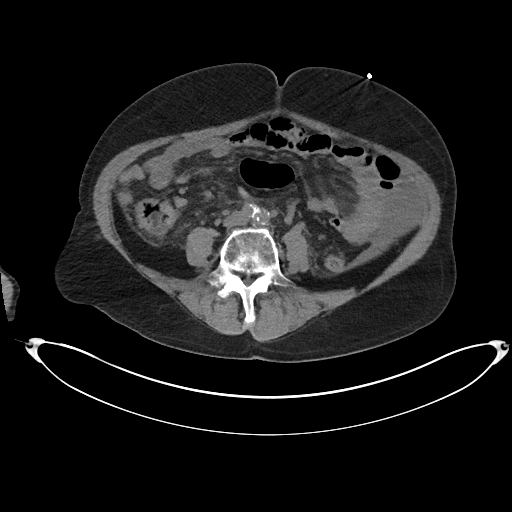
[im 54/97  soft-tissue]
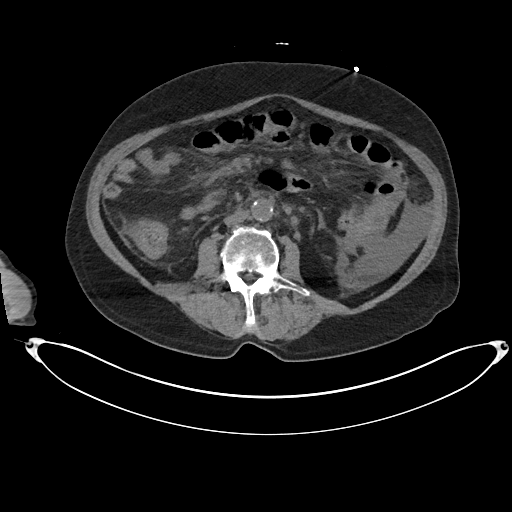
[im 62/97  soft-tissue]
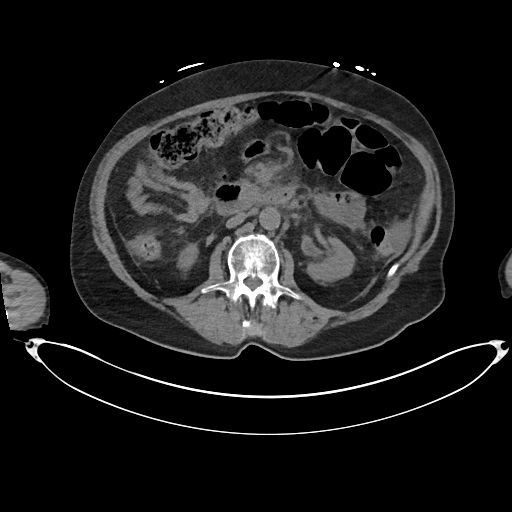
[im 70/97  soft-tissue]
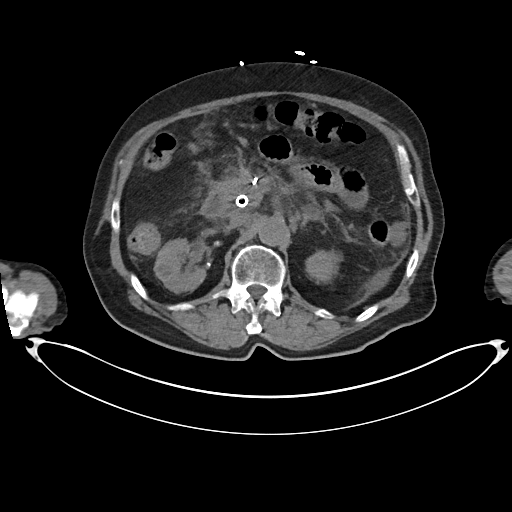
[im 70/97  bone]
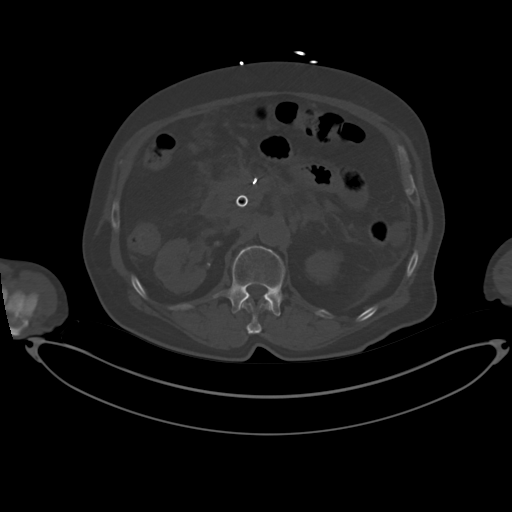
[im 77/97  soft-tissue]
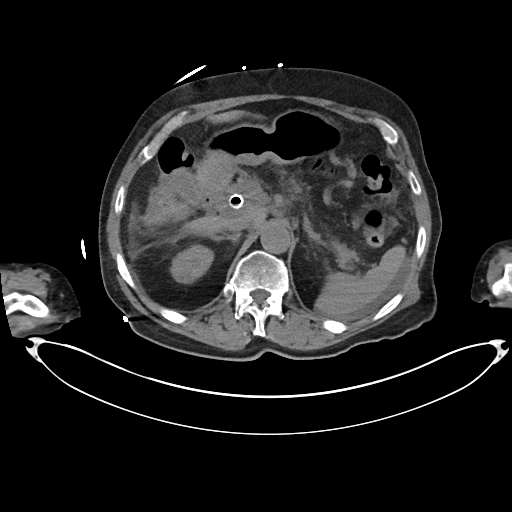
[im 85/97  soft-tissue]
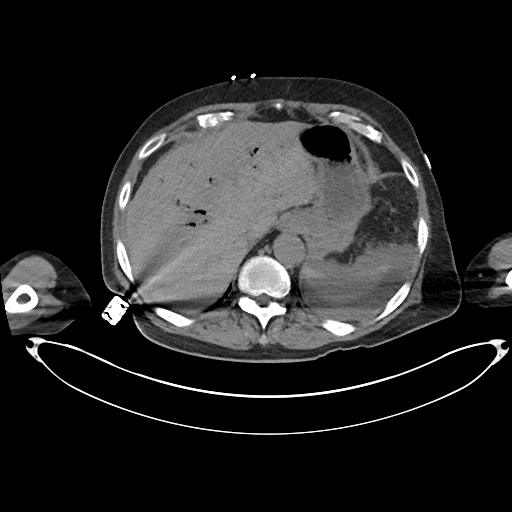
[im 93/97  soft-tissue]
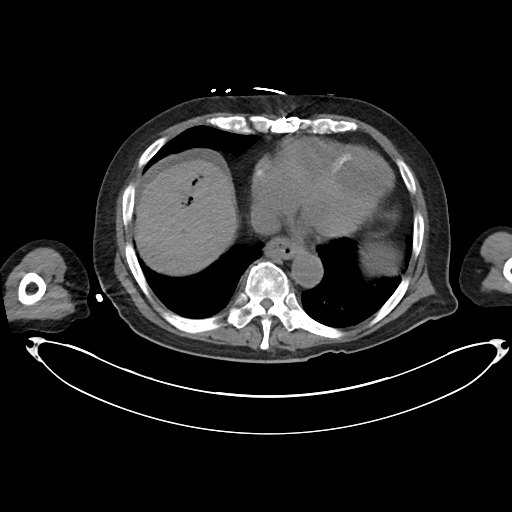

[Series 6: cor st · coronal · 0.75mm/px · 3 of 120 slices shown]
[im 40/120  soft-tissue]
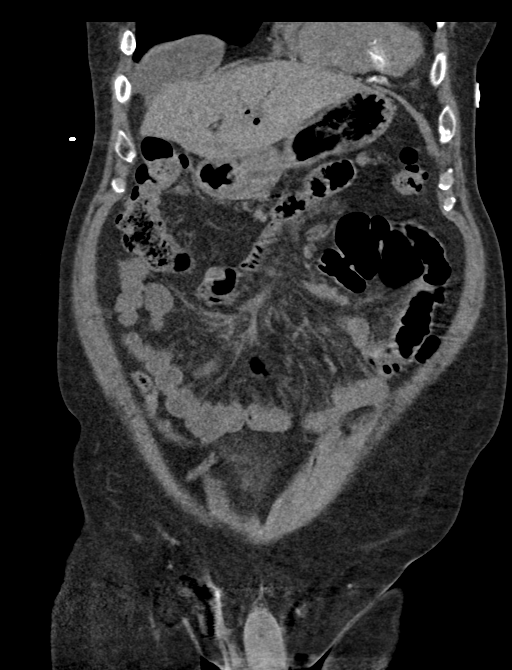
[im 53/120  soft-tissue]
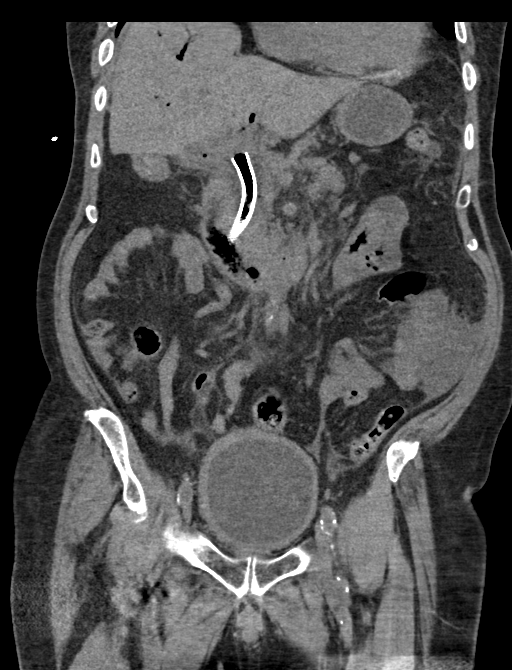
[im 67/120  soft-tissue]
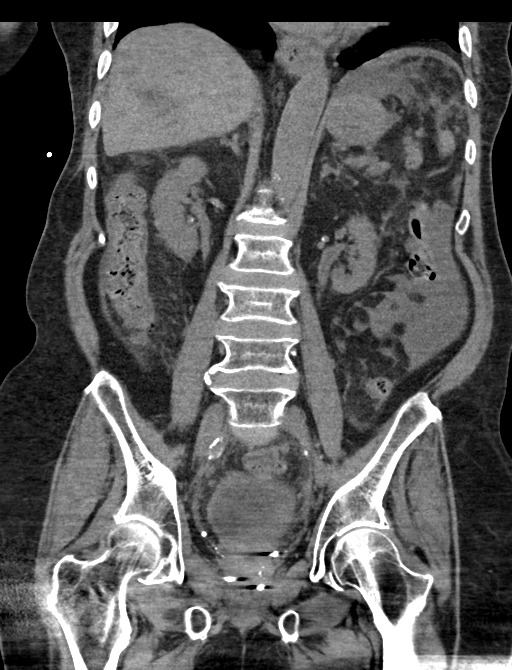

[15 of 46 positions shown; findings below may reference images not displayed]

FINDINGS: Lower chest: The included heart size is enlarged but stable in
appearance without pericardial effusion or thickening. Chronic left
ventricular calcified aneurysm is identified the inferior wall. Lung
bases are free of pulmonary consolidations. No effusion or
pneumothorax is identified.

Hepatobiliary: Pneumobilia is identified compatible with recent
intervention with common bowel duct stent in place. Small amount of
perihepatic ascites is noted, new since prior. Gallbladder is
unremarkable and free of stones. No space-occupying mass of the
liver is identified given limitations of a noncontrast study.

Pancreas: Peripancreatic edema is identified, new since prior
raising concern for changes of acute pancreatitis. No pseudocyst
formation or ductal dilatation of the pancreatic gland is
identified. Surgical clips are again noted in the neck of the
pancreas.

Spleen: New perisplenic ascites. No splenomegaly.

Adrenals/Urinary Tract: Normal bilateral adrenal glands. Subtle
bilateral renal hypodensities compatible with renal cysts some which
are too small to characterize. In exophytic indeterminate
hypodensities noted off the lateral aspect of the left kidney,
stable in appearance measuring up to 8 mm. Chronic mild ectasia of
the renal collecting systems with diffuse thick-walled appearance of
the urinary bladder, raising cystitis as a possibility for this
appearance.

Stomach/Bowel: Stomach is within normal limits. Appendix appears
normal. There is mild anorectal soft tissue thickening likely due to
underdistention. Internal hemorrhoids are a possibility as well.

Vascular/Lymphatic: Aortoiliac and branch vessel atherosclerosis.
Soft tissue densities in the porta hepatis can not exclude porta
hepatic adenopathy. No significant progression is identified, the
largest approximately 13 mm. No enlarged retroperitoneal or pelvic
lymph nodes.

Reproductive: Metallic clips noted in the prostate with mild
prostatic enlargement. This is stable.

Other: Mild mesenteric edema is noted

Musculoskeletal: Stable multilevel biconcave likely osteoporotic
mild compressions of along the lumbar spine. No aggressive osseous
lesions. Stable lipomas of the external oblique muscles bilaterally.
IMPRESSION: 1. Interval development of peripancreatic edema with new small
volume of perihepatic and perisplenic ascites. Findings are in
keeping with acute pancreatitis. No pseudocyst formation or ductal
dilatation is identified.
2. New CBD stent in place with resultant pneumobilia from recent
intervention.
3. Bilateral renal hypodensities some which appear to represent
simple cysts and others are indeterminate possibly proteinaceous or
complex.
4. Mild anorectal soft tissue thickening, felt in part due to
underdistention. Internal hemorrhoids are a possibility as well.
Proctocolitis is believed less likely.

## 2020-08-11 IMAGING — CT CT HEAD W/O CM
3 series · 14 of 47 positions shown, 16 images · non-contrast
Comparison: None.

Addendum:
CLINICAL DATA: Ataxia.  Clinical suspicion for stroke.

EXAM:
CT HEAD WITHOUT CONTRAST
TECHNIQUE: Contiguous axial images were obtained from the base of the skull
through the vertex without intravenous contrast.

[Series 3: head 5.0 h30s · axial · 0.42mm/px · z∈[-93,+42]mm · 8 of 33 slices shown, 10 images]
[im 3/33  brain]
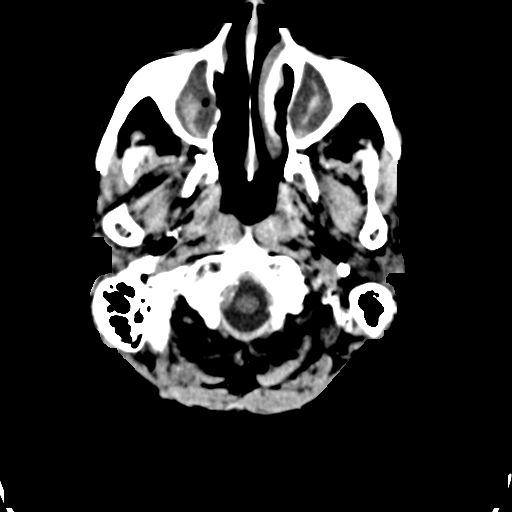
[im 3/33  bone]
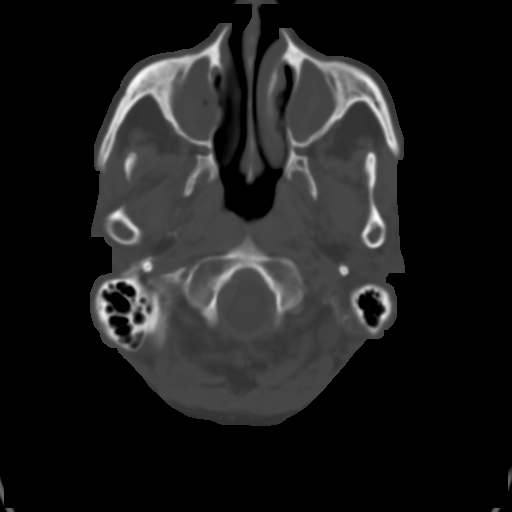
[im 7/33  brain]
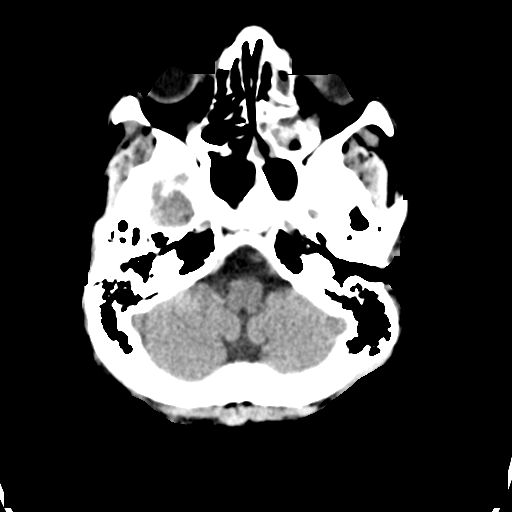
[im 10/33  brain]
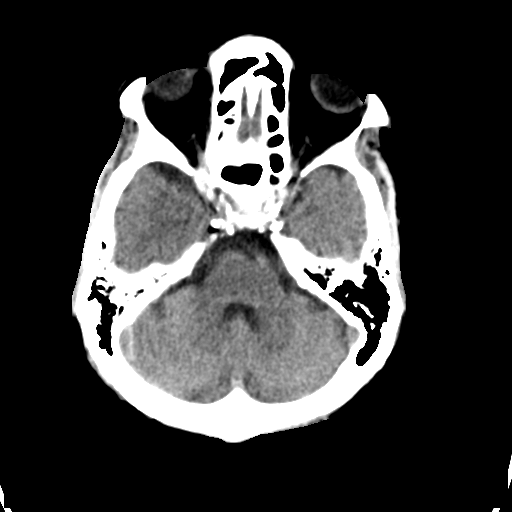
[im 15/33  brain]
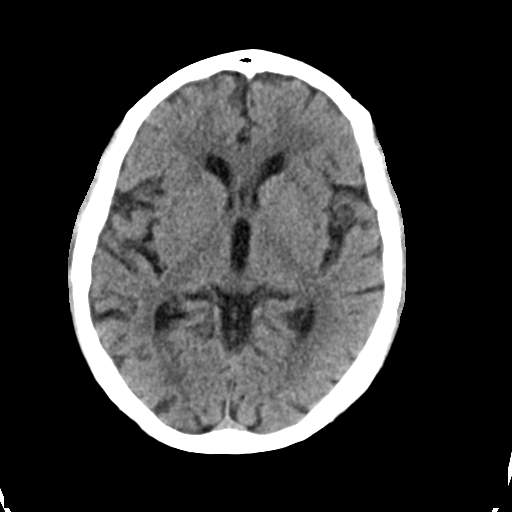
[im 18/33  brain]
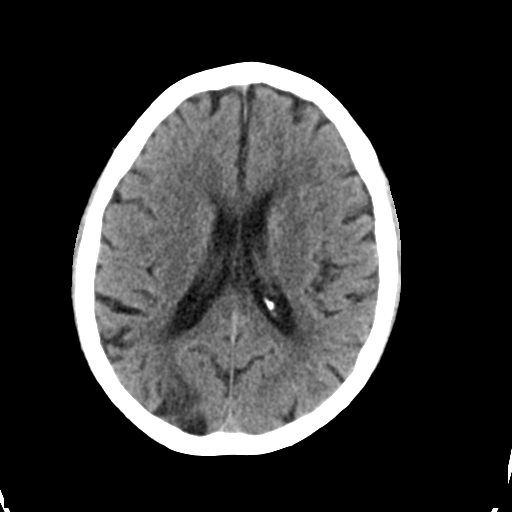
[im 18/33  bone]
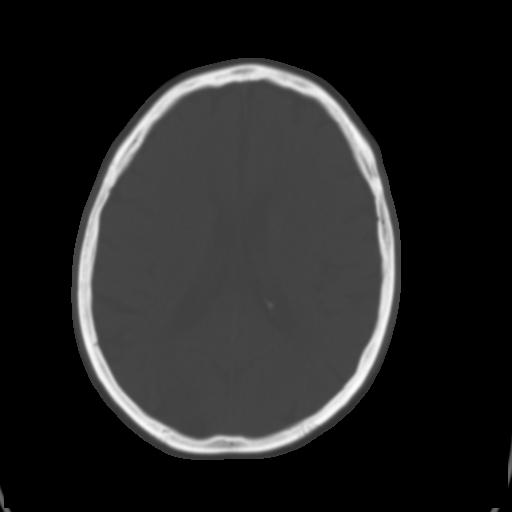
[im 23/33  brain]
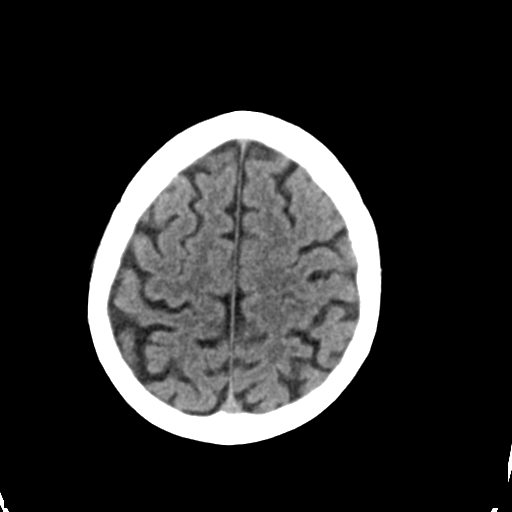
[im 26/33  brain]
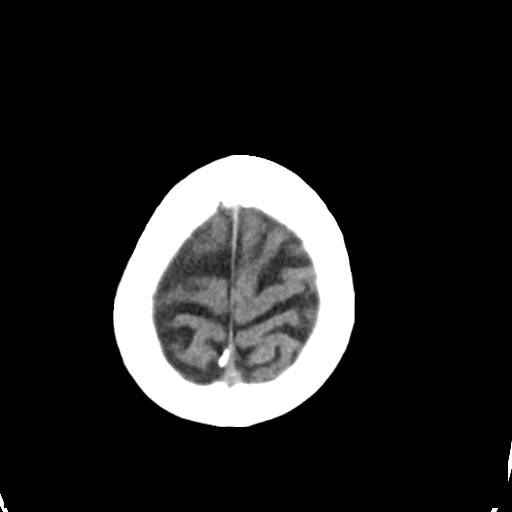
[im 30/33  brain]
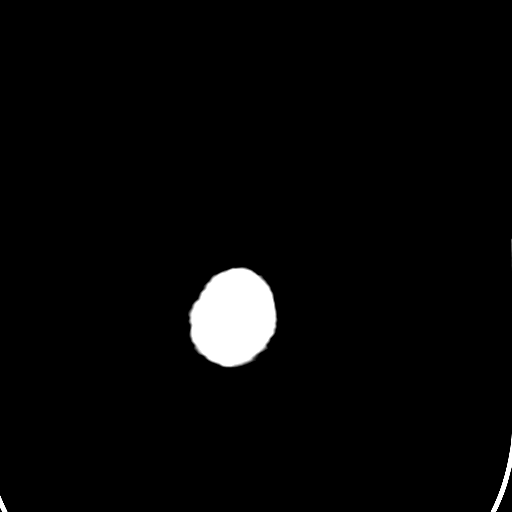

[Series 5: head 3.0 mpr cor · coronal · 0.31mm/px · 3 of 71 slices shown]
[im 24/71  brain]
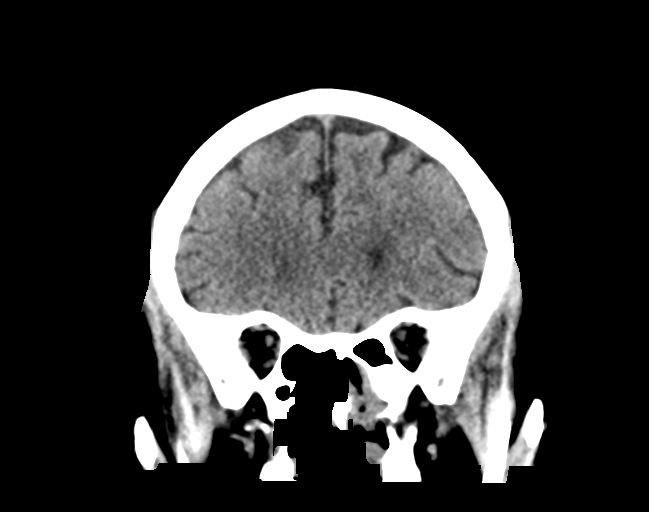
[im 32/71  brain]
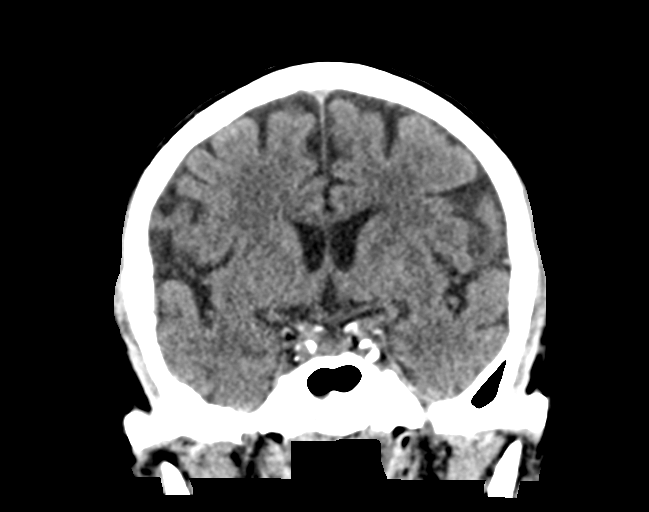
[im 39/71  brain]
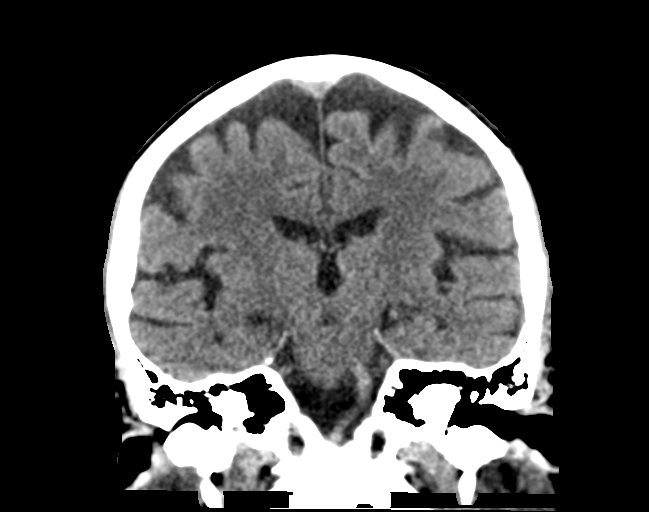

[Series 6: head 3.0 mpr sag · sagittal · 0.31mm/px · 3 of 67 slices shown]
[im 23/67  brain]
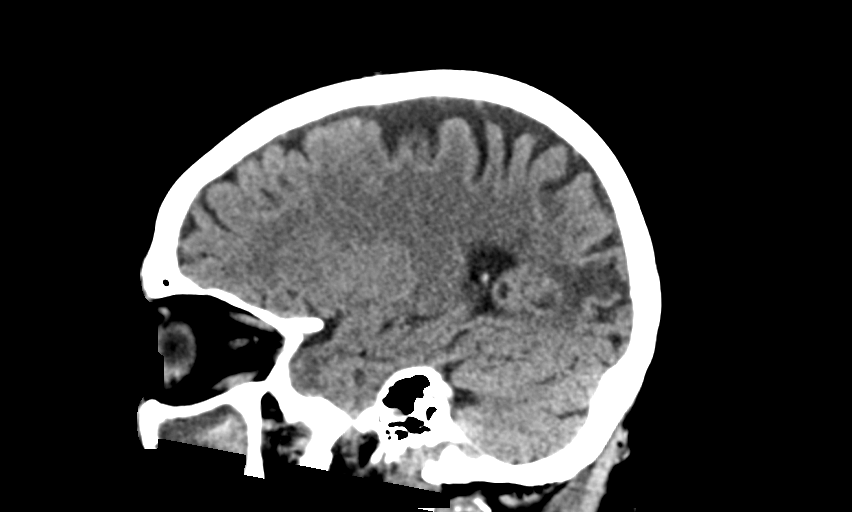
[im 34/67  brain]
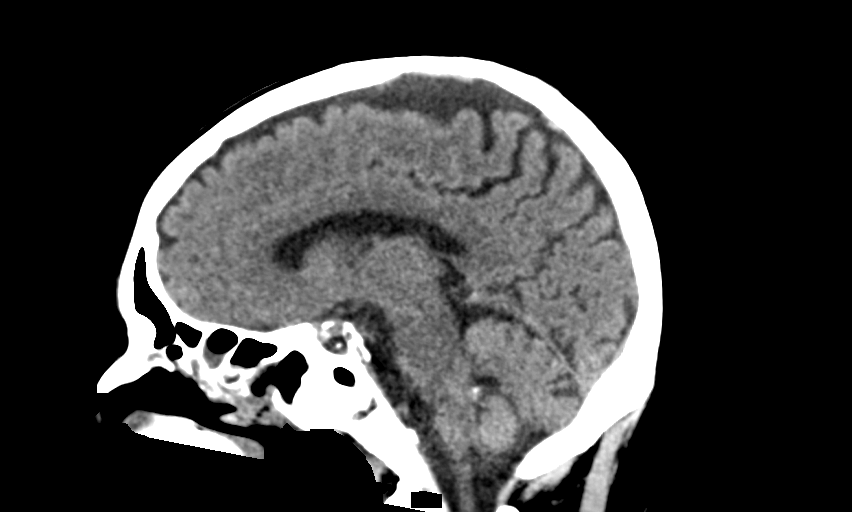
[im 45/67  brain]
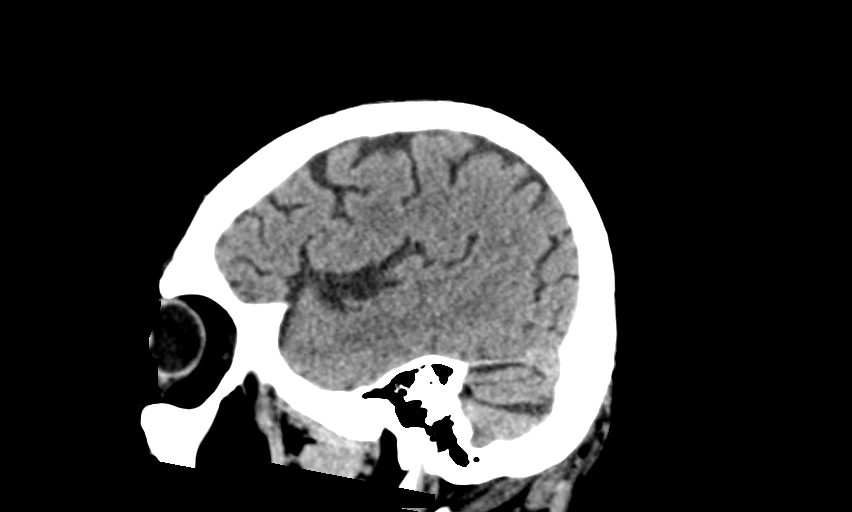

[14 of 47 positions shown; findings below may reference images not displayed]

FINDINGS: Brain: Area of hypoattenuation in the right occipital lobe, likely
represents age-indeterminate ischemic infarction. No evidence of
intracranial hemorrhage, mass effect. Normal ventricular size.

Vascular: Calcific atherosclerotic disease at the skull base.

Skull: Normal. Negative for fracture or focal lesion.

Sinuses/Orbits: Near complete opacification of the maxillary sinuses
with chronic periosteal reaction and remodeling of the sinus walls.
Near complete opacification of the left ethmoid sinuses with
expansile appearance. Mucosal thickening of the right ethmoid sinus
and bilateral sphenoid sinuses. Milder mucosal thickening of the
frontal sinuses.

Other: None.
IMPRESSION: Age-indeterminate ischemic infarction in the right occipital lobe.

Pansinusitis with chronic appearance. Fungal sinusitis is not
excluded.

ADDENDUM:
These results were called by telephone at the time of interpretation
on 02/06/2018 at [DATE] to Dr. EIYMA INFINITY , who verbally
acknowledged these results.

*** End of Addendum ***

## 2020-08-11 IMAGING — DX DG CHEST 2V
2 series · 2 of 2 positions shown · non-contrast
Comparison: Chest CT 11/25/2016. Lung bases from abdominal CT
performed concurrently.

CLINICAL DATA: Prostate and pancreatic cancer. Patient reports
abdominal pain. Weakness and dizziness.

EXAM:
CHEST - 2 VIEW

[chest lat]
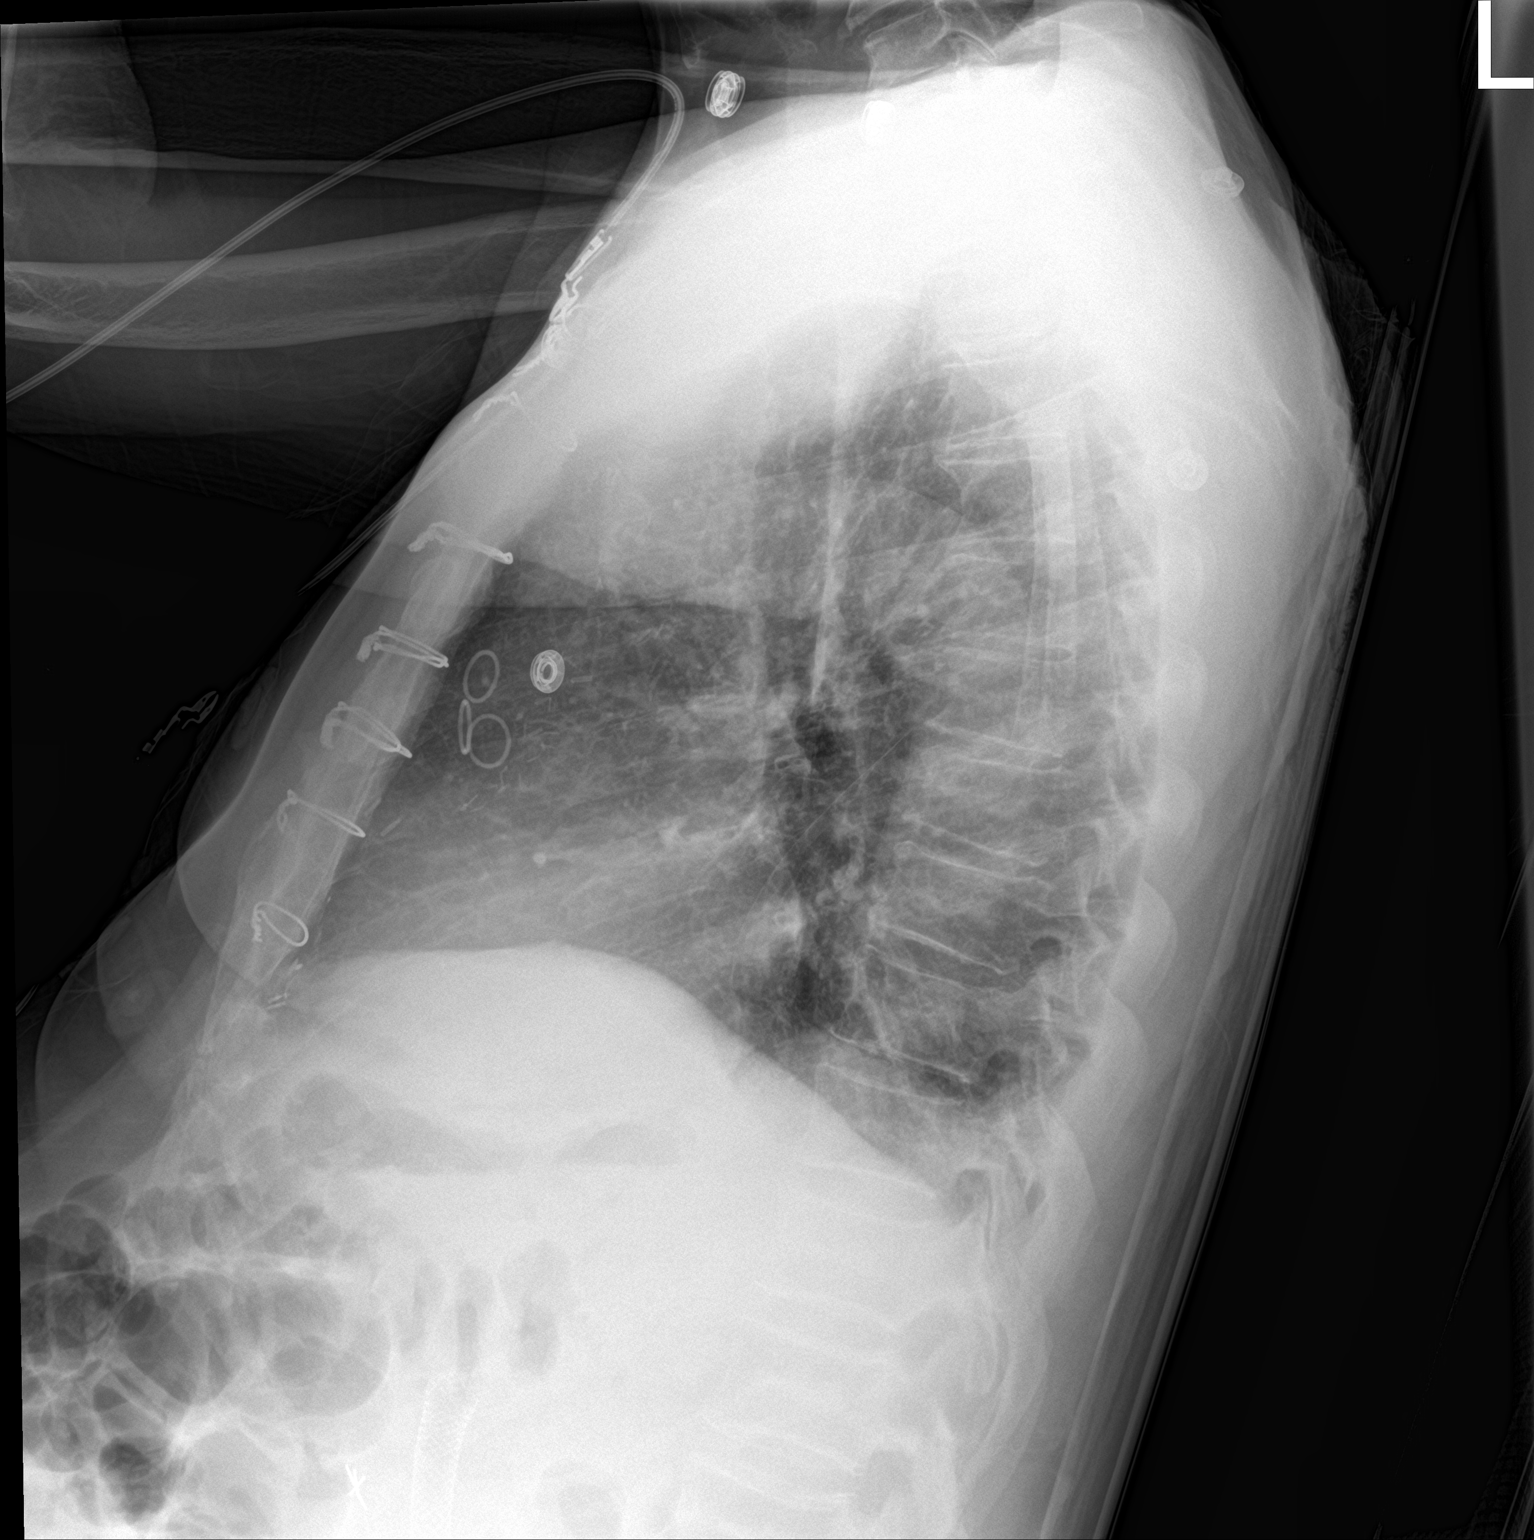

[chest ap]
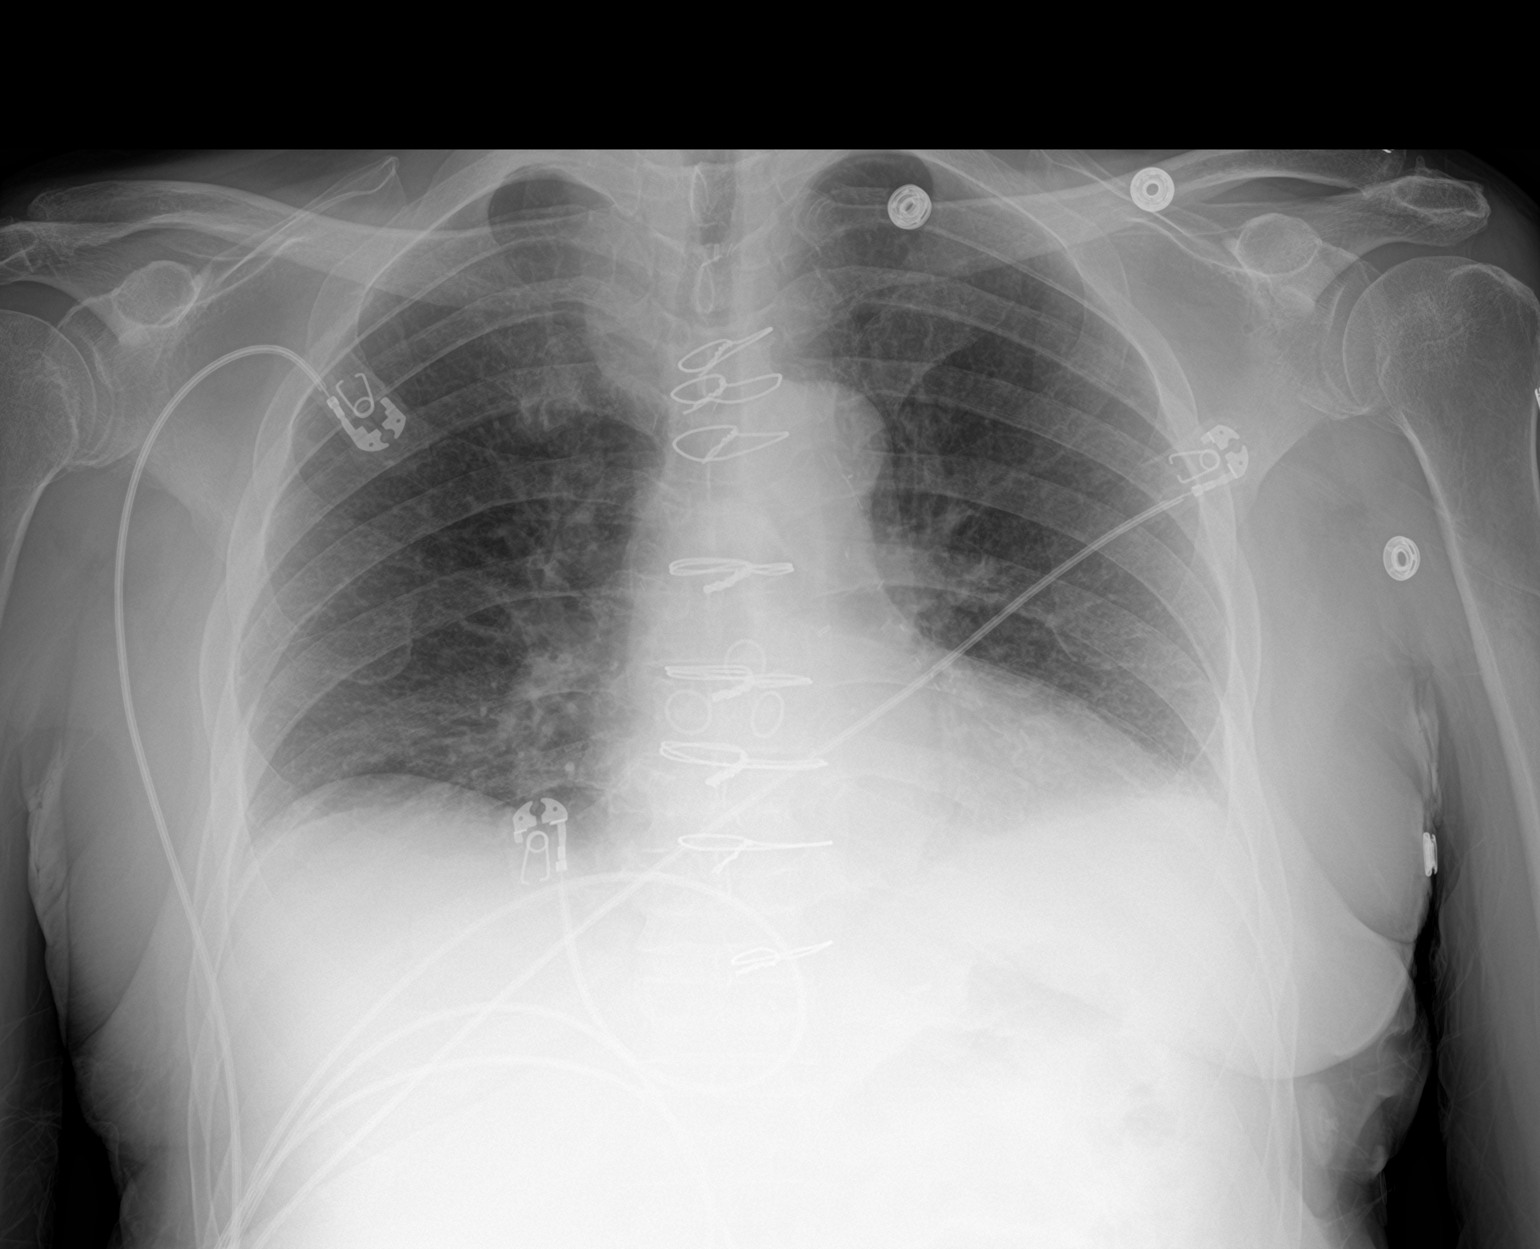

[2 of 2 positions shown; findings below may reference images not displayed]

FINDINGS: Post median sternotomy and CABG. Low lung volumes with prominent
heart size, accentuated by hypoaeration. No pulmonary edema, focal
airspace disease, pleural effusion or pneumothorax. No acute osseous
abnormalities.
IMPRESSION: Low lung volumes without acute abnormality.

## 2020-09-25 IMAGING — DX PORTABLE CHEST - 1 VIEW
1 series · 1 of 1 positions shown · non-contrast
Comparison: 02/06/2018 and 03/09/2015

CLINICAL DATA: Shortness of breath. Dyspnea. Altered mental status.

EXAM:
PORTABLE CHEST 1 VIEW

[chest]
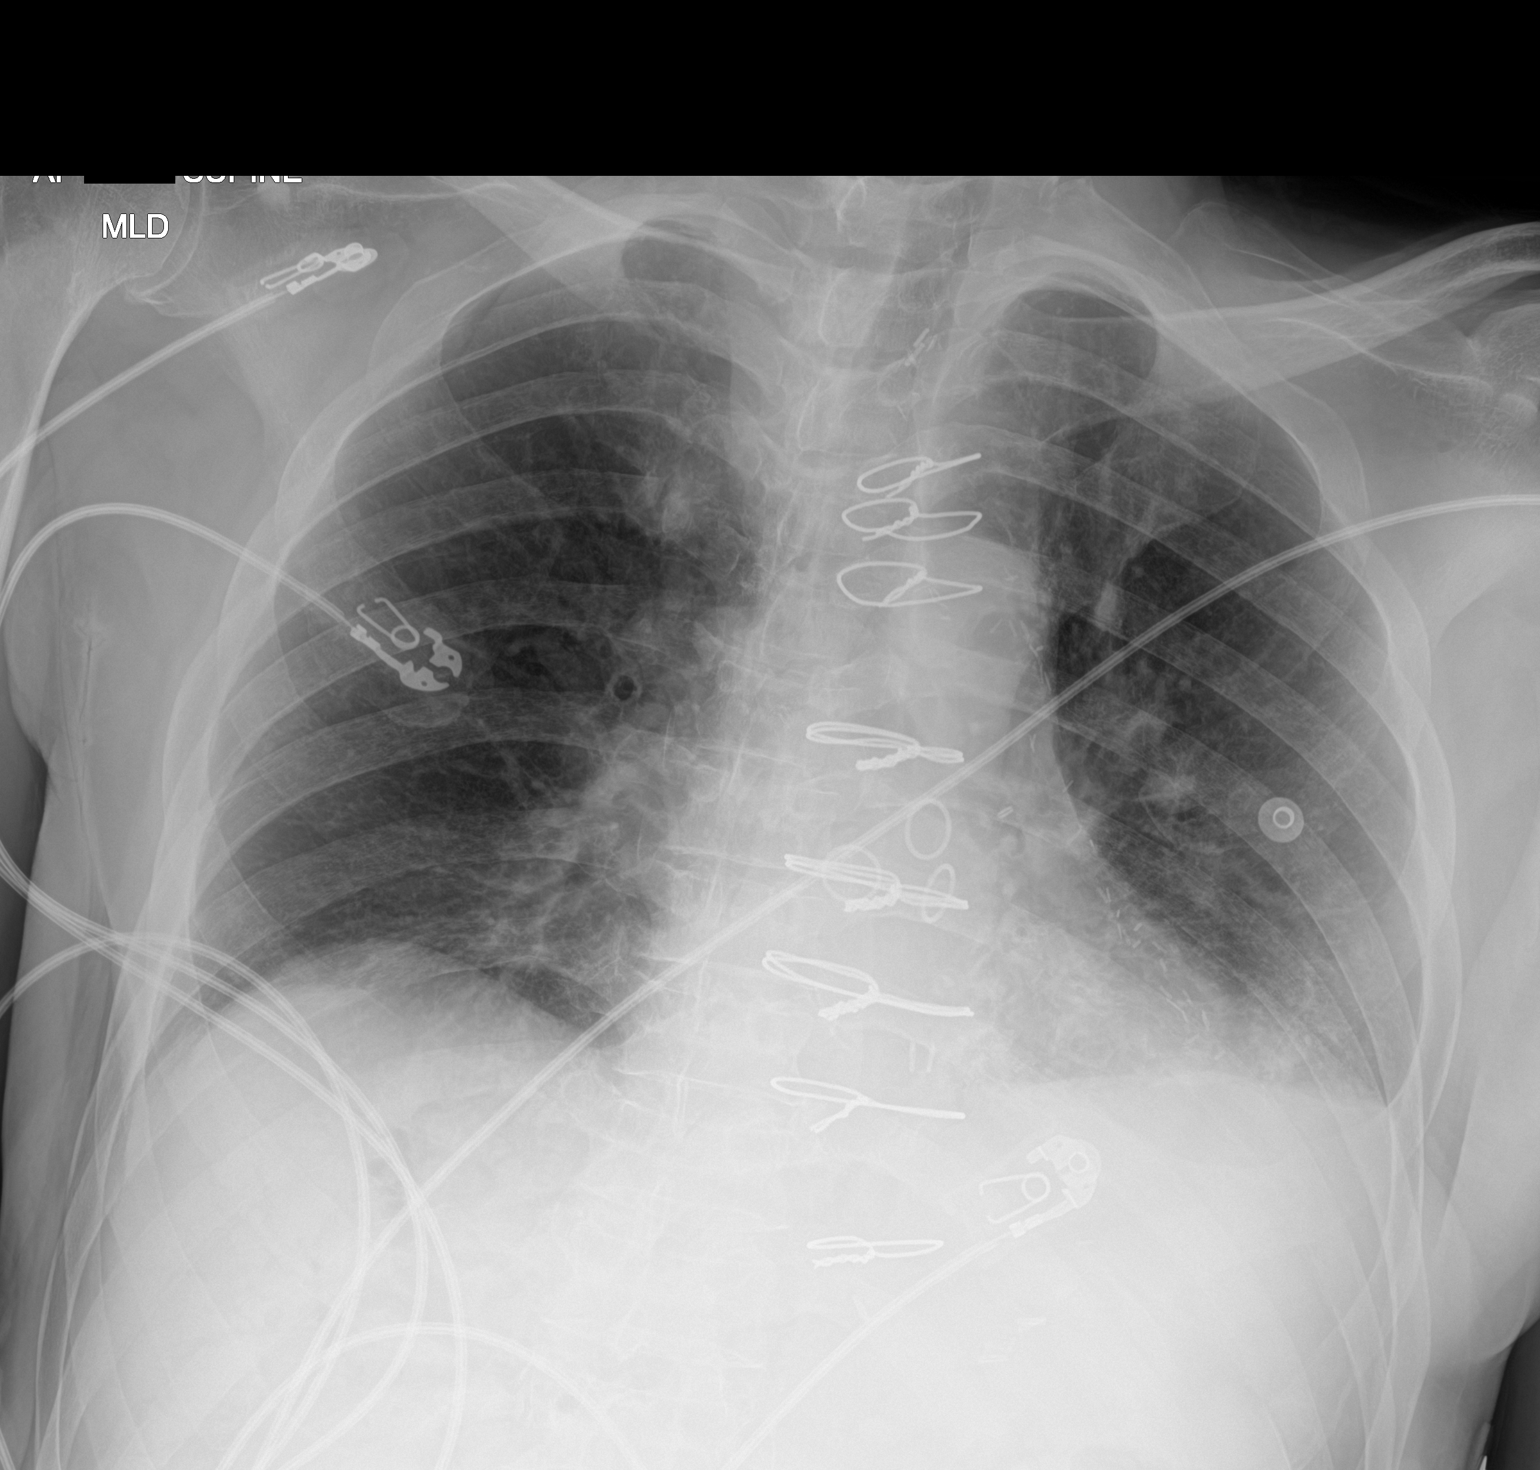

[1 of 1 positions shown; findings below may reference images not displayed]

FINDINGS: The heart size and pulmonary vascularity are normal. CABG. Slight
increase in the small left pleural effusion with minimal left base
atelectasis. Right lung is clear except for slight new peribronchial
thickening.
IMPRESSION: 1. Slight increase in small nonspecific left pleural effusion with
minimal left base atelectasis.
2. New slight peribronchial thickening consistent with bronchitis.

## 2020-09-25 IMAGING — CT CT HEAD WITHOUT CONTRAST
3 of 4 series · 14 of 47 positions shown, 16 images · non-contrast
Comparison: MR head 02/07/2018. CT head 02/06/2018.

CLINICAL DATA: Altered mental status. Hypoglycemia. Unresponsive.

EXAM:
CT HEAD WITHOUT CONTRAST
TECHNIQUE: Contiguous axial images were obtained from the base of the skull
through the vertex without intravenous contrast.

[Series 3: head 5.0 h30s · axial · 0.43mm/px · z∈[-56,+69]mm · 8 of 31 slices shown, 10 images]
[im 3/31  brain]
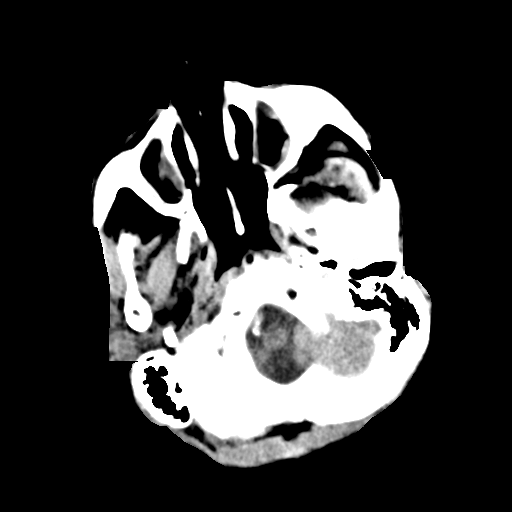
[im 3/31  bone]
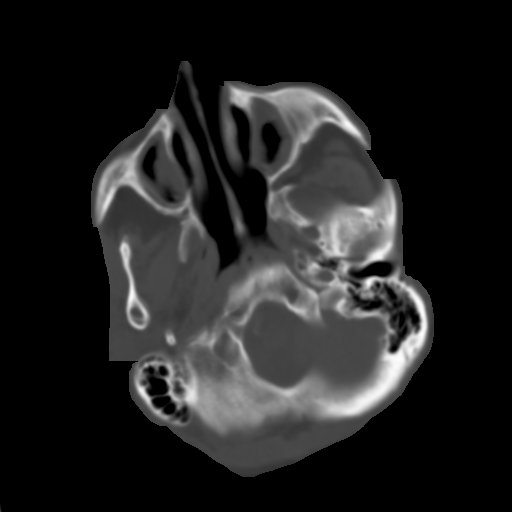
[im 7/31  brain]
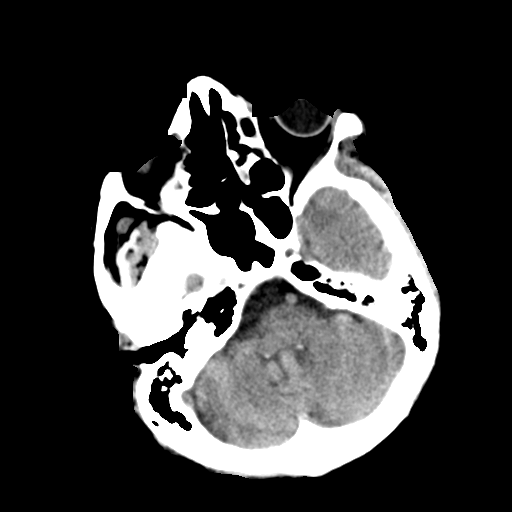
[im 11/31  brain]
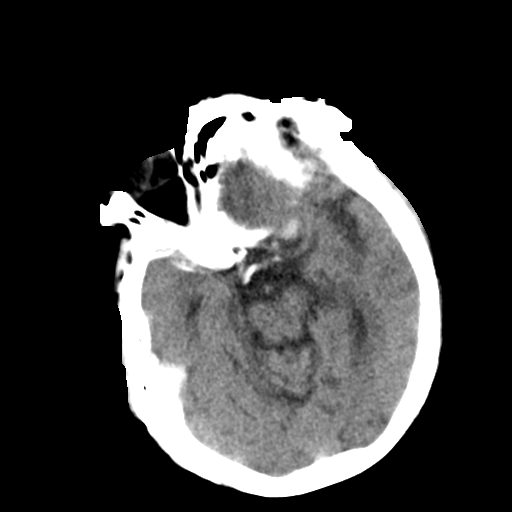
[im 13/31  brain]
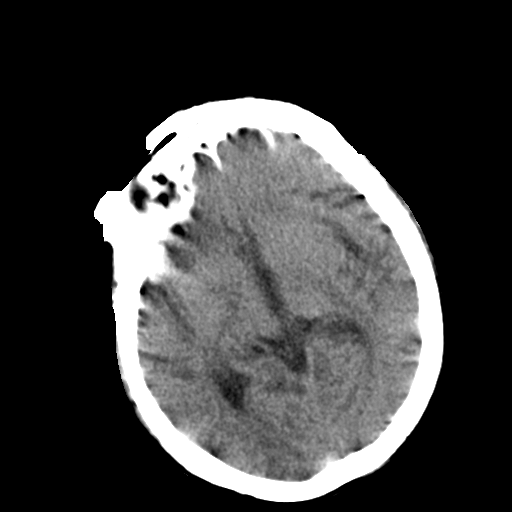
[im 18/31  brain]
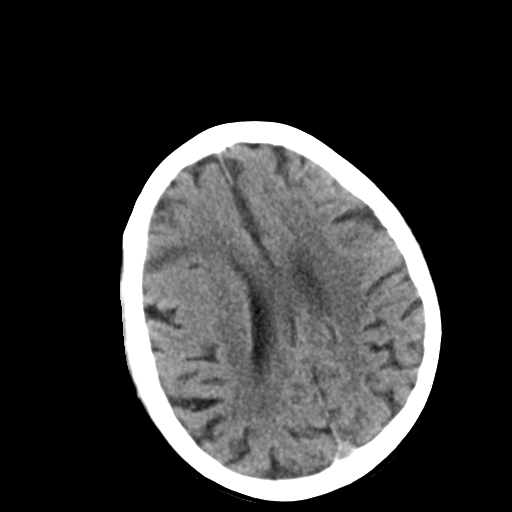
[im 18/31  bone]
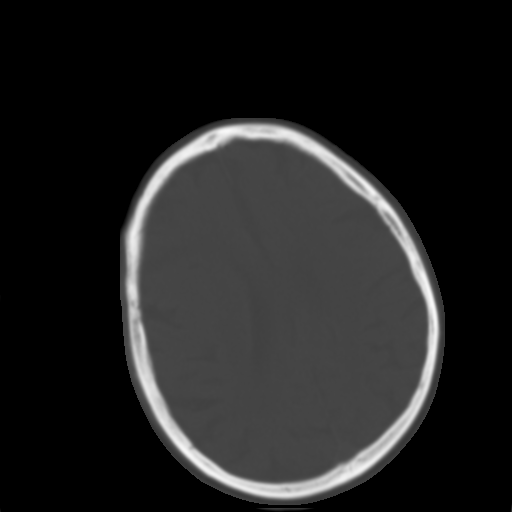
[im 20/31  brain]
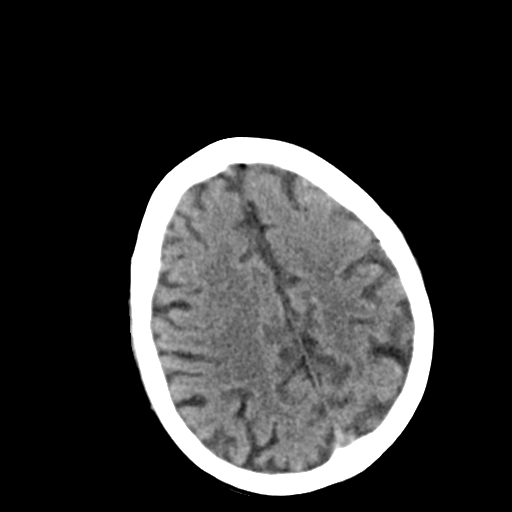
[im 24/31  brain]
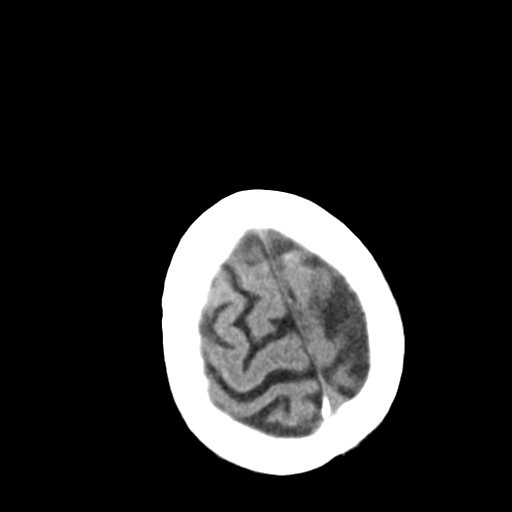
[im 28/31  brain]
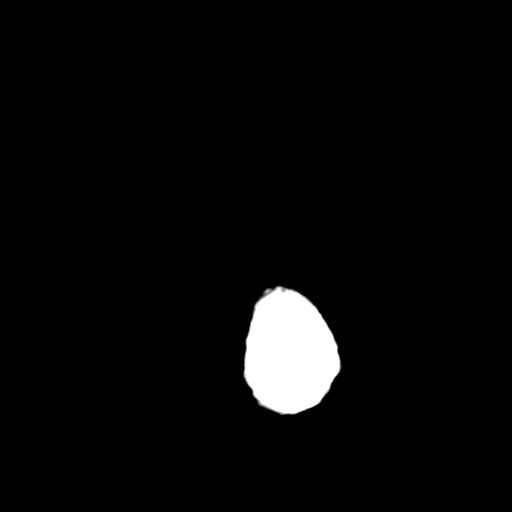

[Series 7: head 3.0 mpr cor · coronal · 0.28mm/px · 3 of 73 slices shown]
[im 25/73  brain]
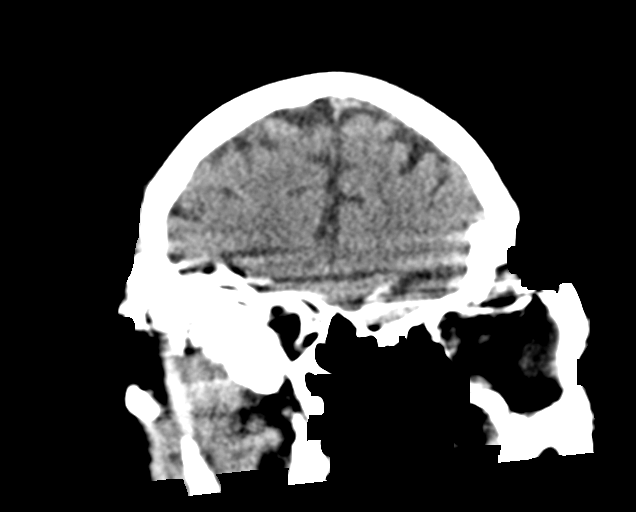
[im 33/73  brain]
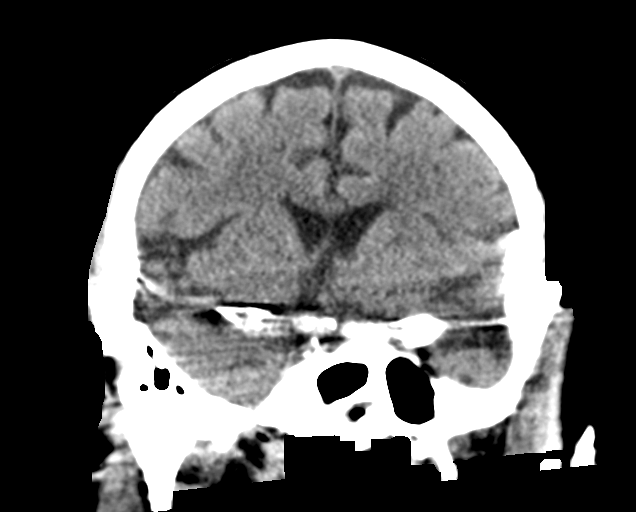
[im 41/73  brain]
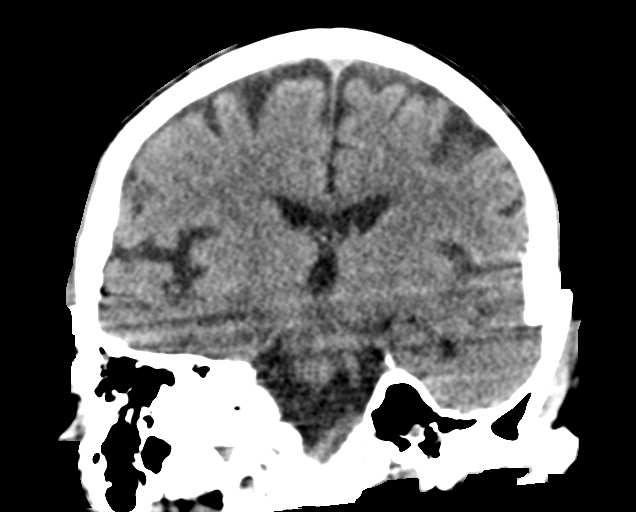

[Series 8: head 3.0 mpr sag · sagittal · 0.33mm/px · 3 of 56 slices shown]
[im 19/56  brain]
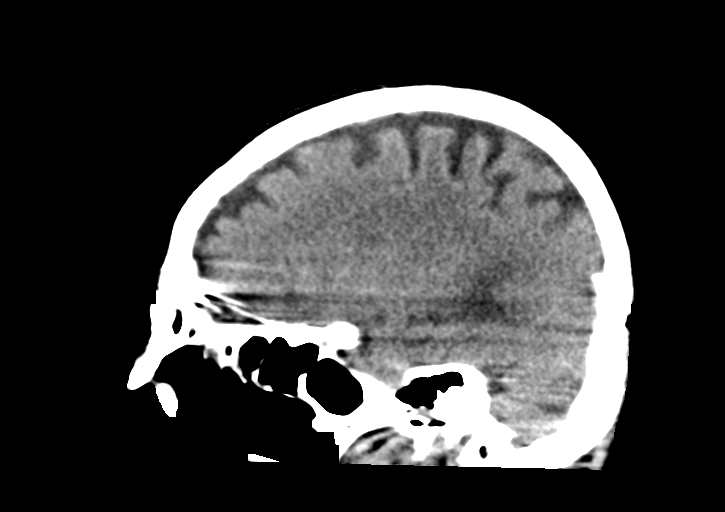
[im 28/56  brain]
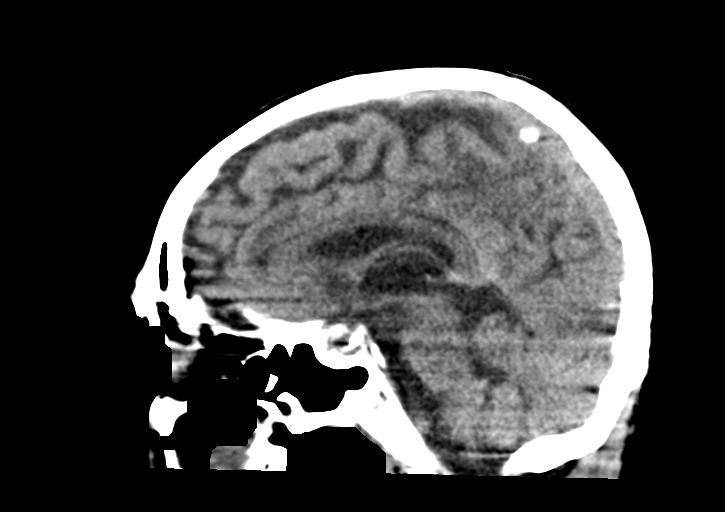
[im 37/56  brain]
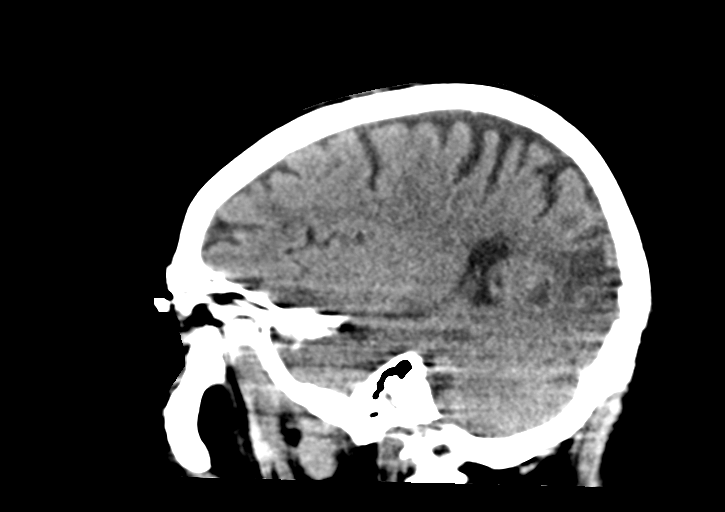

[14 of 47 positions shown; findings below may reference images not displayed]

FINDINGS: The patient was unable to remain motionless for the exam. Small or
subtle lesions could be overlooked.

Brain: No definite acute stroke, acute hemorrhage, mass lesion,
hydrocephalus, or extra-axial fluid. RIGHT hemisphere
encephalomalacia was demonstrated on prior MR, related to an old
stroke. This is most prominent in the RIGHT occipital lobe.

Atrophy and small vessel disease was better demonstrated on previous
studies.

Vascular: Calcification of the cavernous internal carotid arteries
consistent with cerebrovascular atherosclerotic disease. No signs of
intracranial large vessel occlusion.

Skull: Calvarium grossly intact.

Sinuses/Orbits: Mucosal thickening in both maxillary sinuses.
Negative orbits.

Other: None.
IMPRESSION: Motion degraded exam demonstrating no definite acute intracranial
findings. Chronic RIGHT hemisphere encephalomalacia related to an
old stroke.
